# Patient Record
Sex: Female | Born: 1955
Health system: Southern US, Community
[De-identification: ages and names within clinical notes are randomized; demographics above are authoritative.]

## PROBLEM LIST (undated history)

## (undated) DIAGNOSIS — T8859XA Other complications of anesthesia, initial encounter: Secondary | ICD-10-CM

## (undated) DIAGNOSIS — F32A Depression, unspecified: Secondary | ICD-10-CM

## (undated) DIAGNOSIS — M549 Dorsalgia, unspecified: Secondary | ICD-10-CM

## (undated) DIAGNOSIS — I341 Nonrheumatic mitral (valve) prolapse: Secondary | ICD-10-CM

## (undated) DIAGNOSIS — Z8669 Personal history of other diseases of the nervous system and sense organs: Secondary | ICD-10-CM

## (undated) DIAGNOSIS — B009 Herpesviral infection, unspecified: Secondary | ICD-10-CM

## (undated) DIAGNOSIS — T4145XA Adverse effect of unspecified anesthetic, initial encounter: Secondary | ICD-10-CM

## (undated) DIAGNOSIS — I10 Essential (primary) hypertension: Secondary | ICD-10-CM

## (undated) DIAGNOSIS — G8929 Other chronic pain: Secondary | ICD-10-CM

## (undated) DIAGNOSIS — K219 Gastro-esophageal reflux disease without esophagitis: Secondary | ICD-10-CM

## (undated) DIAGNOSIS — R0602 Shortness of breath: Secondary | ICD-10-CM

## (undated) DIAGNOSIS — J189 Pneumonia, unspecified organism: Secondary | ICD-10-CM

## (undated) DIAGNOSIS — F329 Major depressive disorder, single episode, unspecified: Secondary | ICD-10-CM

## (undated) DIAGNOSIS — J302 Other seasonal allergic rhinitis: Secondary | ICD-10-CM

## (undated) DIAGNOSIS — E785 Hyperlipidemia, unspecified: Secondary | ICD-10-CM

## (undated) DIAGNOSIS — E039 Hypothyroidism, unspecified: Secondary | ICD-10-CM

## (undated) DIAGNOSIS — R011 Cardiac murmur, unspecified: Secondary | ICD-10-CM

## (undated) DIAGNOSIS — J45909 Unspecified asthma, uncomplicated: Secondary | ICD-10-CM

## (undated) HISTORY — PX: TONSILLECTOMY: SUR1361

## (undated) HISTORY — PX: COLONOSCOPY: SHX174

## (undated) HISTORY — PX: ESOPHAGOGASTRODUODENOSCOPY: SHX1529

## (undated) HISTORY — PX: OTHER SURGICAL HISTORY: SHX169

---

## 1998-10-21 ENCOUNTER — Ambulatory Visit (HOSPITAL_BASED_OUTPATIENT_CLINIC_OR_DEPARTMENT_OTHER): Admission: RE | Admit: 1998-10-21 | Discharge: 1998-10-21 | Payer: Self-pay | Admitting: Orthopedic Surgery

## 1999-07-24 ENCOUNTER — Other Ambulatory Visit: Admission: RE | Admit: 1999-07-24 | Discharge: 1999-07-24 | Payer: Self-pay | Admitting: Obstetrics and Gynecology

## 2000-06-24 ENCOUNTER — Encounter: Admission: RE | Admit: 2000-06-24 | Discharge: 2000-06-24 | Payer: Self-pay | Admitting: Obstetrics and Gynecology

## 2000-06-24 ENCOUNTER — Encounter: Payer: Self-pay | Admitting: Obstetrics and Gynecology

## 2000-07-29 ENCOUNTER — Other Ambulatory Visit: Admission: RE | Admit: 2000-07-29 | Discharge: 2000-07-29 | Payer: Self-pay | Admitting: *Deleted

## 2001-07-07 ENCOUNTER — Encounter: Payer: Self-pay | Admitting: Obstetrics and Gynecology

## 2001-07-07 ENCOUNTER — Encounter: Admission: RE | Admit: 2001-07-07 | Discharge: 2001-07-07 | Payer: Self-pay | Admitting: Obstetrics and Gynecology

## 2001-08-29 ENCOUNTER — Other Ambulatory Visit: Admission: RE | Admit: 2001-08-29 | Discharge: 2001-08-29 | Payer: Self-pay | Admitting: Obstetrics and Gynecology

## 2002-03-07 ENCOUNTER — Ambulatory Visit (HOSPITAL_COMMUNITY): Admission: RE | Admit: 2002-03-07 | Discharge: 2002-03-07 | Payer: Self-pay | Admitting: *Deleted

## 2002-03-07 ENCOUNTER — Encounter: Payer: Self-pay | Admitting: *Deleted

## 2002-07-13 ENCOUNTER — Encounter: Admission: RE | Admit: 2002-07-13 | Discharge: 2002-07-13 | Payer: Self-pay | Admitting: Obstetrics and Gynecology

## 2002-07-13 ENCOUNTER — Encounter: Payer: Self-pay | Admitting: Obstetrics and Gynecology

## 2003-06-12 ENCOUNTER — Other Ambulatory Visit: Admission: RE | Admit: 2003-06-12 | Discharge: 2003-06-12 | Payer: Self-pay | Admitting: Obstetrics and Gynecology

## 2003-09-16 ENCOUNTER — Encounter: Admission: RE | Admit: 2003-09-16 | Discharge: 2003-09-16 | Payer: Self-pay | Admitting: Obstetrics and Gynecology

## 2003-09-16 ENCOUNTER — Encounter: Payer: Self-pay | Admitting: Obstetrics and Gynecology

## 2004-05-18 ENCOUNTER — Ambulatory Visit (HOSPITAL_COMMUNITY): Admission: RE | Admit: 2004-05-18 | Discharge: 2004-05-18 | Payer: Self-pay | Admitting: Internal Medicine

## 2004-10-08 ENCOUNTER — Encounter: Admission: RE | Admit: 2004-10-08 | Discharge: 2004-10-08 | Payer: Self-pay | Admitting: Obstetrics and Gynecology

## 2005-03-30 ENCOUNTER — Encounter: Admission: RE | Admit: 2005-03-30 | Discharge: 2005-03-30 | Payer: Self-pay | Admitting: Orthopedic Surgery

## 2005-10-12 ENCOUNTER — Encounter: Admission: RE | Admit: 2005-10-12 | Discharge: 2005-10-12 | Payer: Self-pay | Admitting: Obstetrics and Gynecology

## 2005-11-12 ENCOUNTER — Ambulatory Visit: Payer: Self-pay

## 2006-01-20 ENCOUNTER — Ambulatory Visit (HOSPITAL_COMMUNITY): Admission: RE | Admit: 2006-01-20 | Discharge: 2006-01-20 | Payer: Self-pay | Admitting: Internal Medicine

## 2006-11-29 HISTORY — PX: CHOLECYSTECTOMY: SHX55

## 2006-12-26 ENCOUNTER — Ambulatory Visit (HOSPITAL_COMMUNITY): Admission: RE | Admit: 2006-12-26 | Discharge: 2006-12-26 | Payer: Self-pay | Admitting: Gastroenterology

## 2006-12-30 ENCOUNTER — Encounter (INDEPENDENT_AMBULATORY_CARE_PROVIDER_SITE_OTHER): Payer: Self-pay | Admitting: Specialist

## 2006-12-30 ENCOUNTER — Ambulatory Visit (HOSPITAL_COMMUNITY): Admission: RE | Admit: 2006-12-30 | Discharge: 2006-12-31 | Payer: Self-pay | Admitting: Surgery

## 2007-06-21 ENCOUNTER — Encounter: Admission: RE | Admit: 2007-06-21 | Discharge: 2007-06-21 | Payer: Self-pay | Admitting: Obstetrics and Gynecology

## 2008-07-05 ENCOUNTER — Encounter: Admission: RE | Admit: 2008-07-05 | Discharge: 2008-07-05 | Payer: Self-pay | Admitting: Obstetrics and Gynecology

## 2009-07-23 ENCOUNTER — Encounter: Admission: RE | Admit: 2009-07-23 | Discharge: 2009-07-23 | Payer: Self-pay | Admitting: Obstetrics and Gynecology

## 2009-11-29 HISTORY — PX: DILATION AND CURETTAGE OF UTERUS: SHX78

## 2009-11-29 HISTORY — PX: ABLATION: SHX5711

## 2010-02-27 ENCOUNTER — Encounter: Admission: RE | Admit: 2010-02-27 | Discharge: 2010-02-27 | Payer: Self-pay | Admitting: Orthopedic Surgery

## 2010-03-29 HISTORY — PX: CERVICAL FUSION: SHX112

## 2010-04-01 ENCOUNTER — Ambulatory Visit (HOSPITAL_COMMUNITY): Admission: RE | Admit: 2010-04-01 | Discharge: 2010-04-01 | Payer: Self-pay | Admitting: Obstetrics and Gynecology

## 2010-04-16 ENCOUNTER — Inpatient Hospital Stay (HOSPITAL_COMMUNITY): Admission: RE | Admit: 2010-04-16 | Discharge: 2010-04-17 | Payer: Self-pay | Admitting: Neurological Surgery

## 2010-10-09 ENCOUNTER — Encounter: Admission: RE | Admit: 2010-10-09 | Discharge: 2010-10-09 | Payer: Self-pay | Admitting: Obstetrics and Gynecology

## 2010-12-08 IMAGING — CR DG CERVICAL SPINE 2 OR 3 VIEWS
1 series · 1 of 1 positions shown · non-contrast
Comparison: MRI cervical spine 02/27/2010

CLINICAL DATA: Neck pain

CERVICAL SPINE - 2-3 VIEW

[view not recorded]
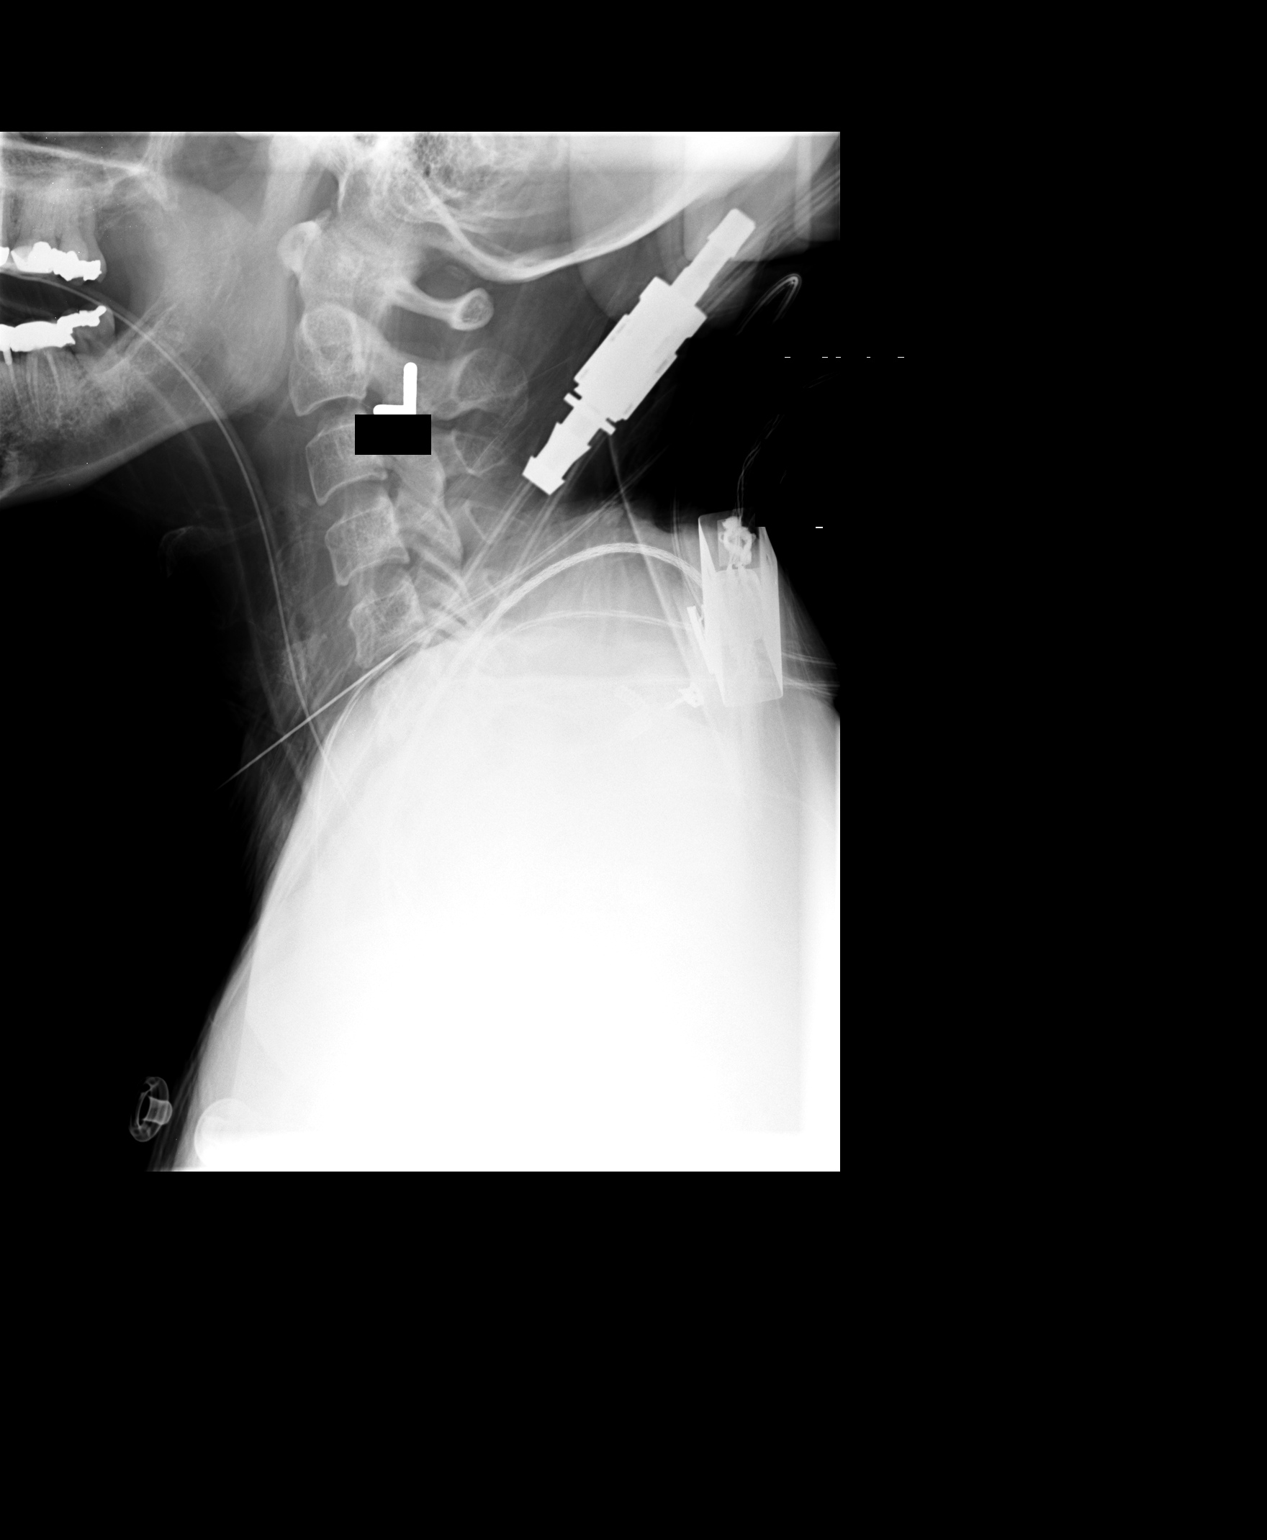

[1 of 1 positions shown; findings below may reference images not displayed]

FINDINGS: Film #1 demonstrates a needle at C5-6 from an anterior
approach.  Film #2 demonstrates ACDF from C5-C7 with satisfactory
interbody plug and plate alignment. A sponge is seen in the
anterior soft tissues.
IMPRESSION: As above.

## 2010-12-08 IMAGING — CR DG CHEST 2V
2 series · 2 of 2 positions shown · non-contrast
Comparison: PA and lateral chest 12/29/2006.

CLINICAL DATA: Preoperative respiratory film.

CHEST - 2 VIEW

[view not recorded (1 of 2)]
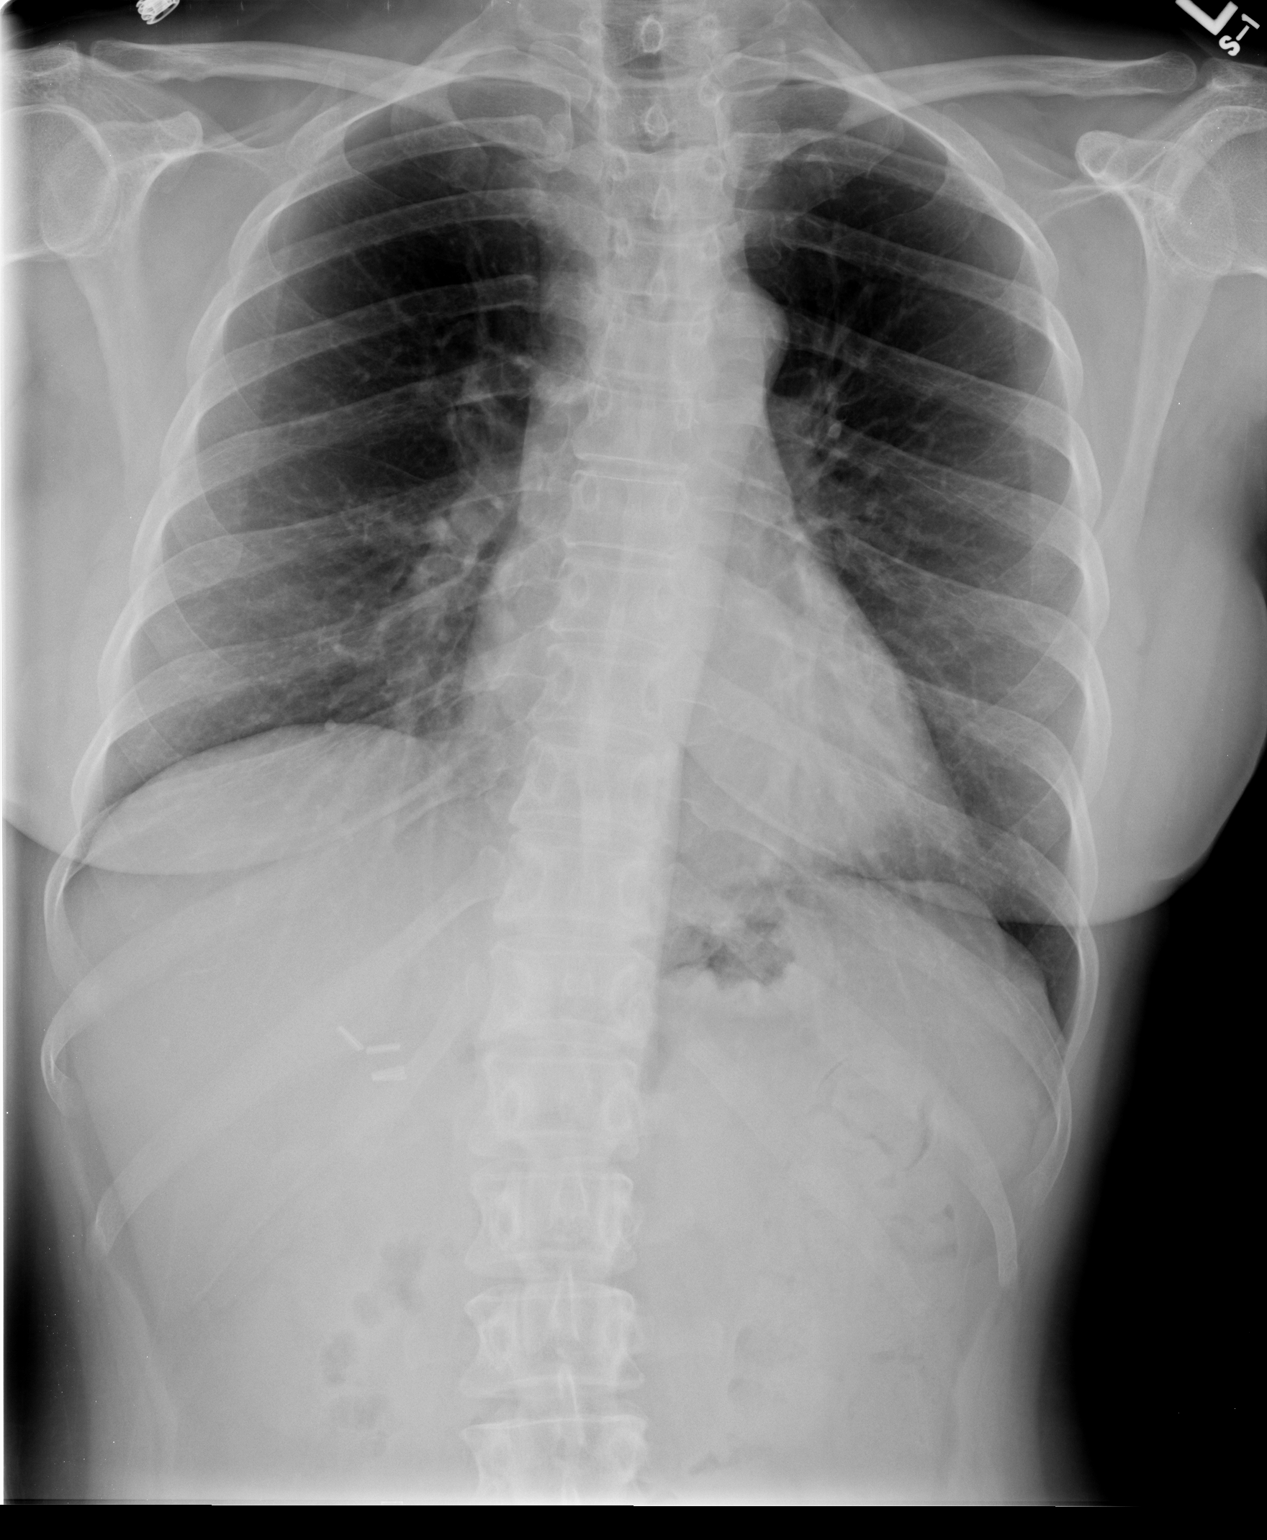

[view not recorded (2 of 2)]
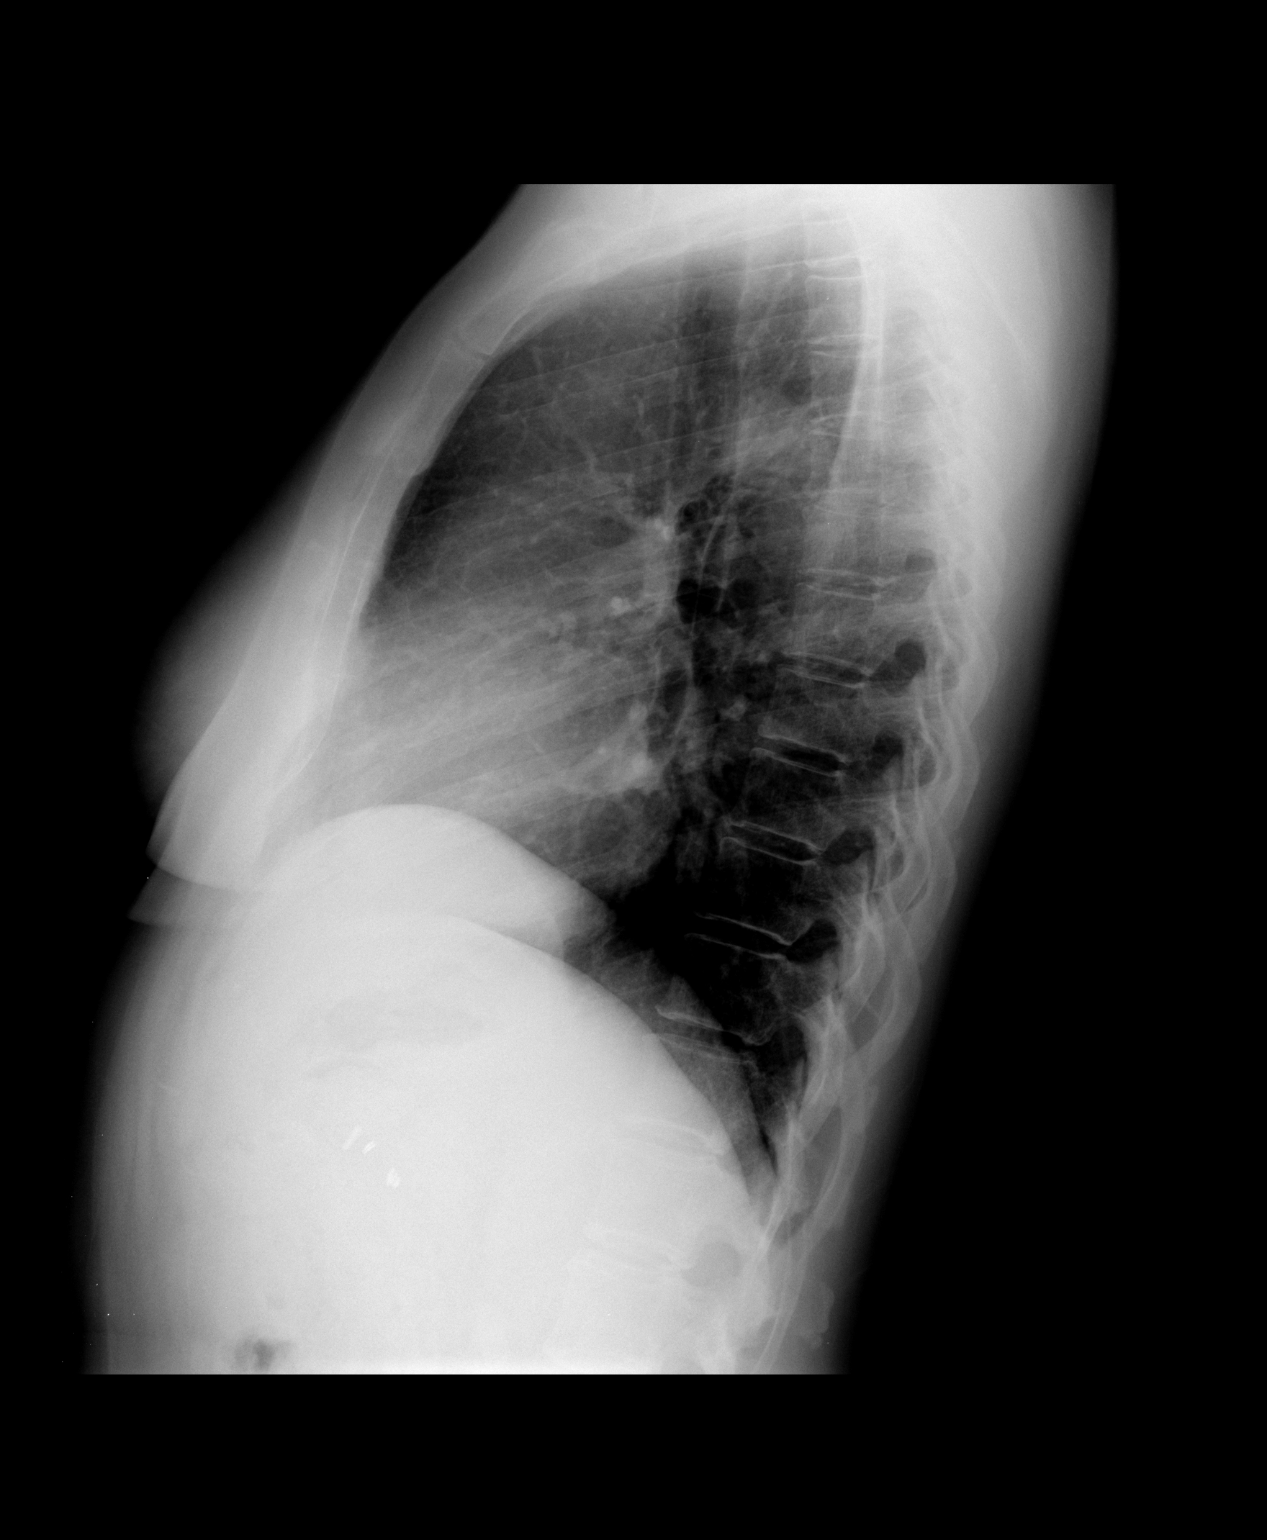

[2 of 2 positions shown; findings below may reference images not displayed]

FINDINGS: Lungs are clear.  No pleural effusion.  Heart size
normal.  No focal bony abnormality.
IMPRESSION: No acute disease.

## 2010-12-14 ENCOUNTER — Encounter
Admission: RE | Admit: 2010-12-14 | Discharge: 2010-12-14 | Payer: Self-pay | Source: Home / Self Care | Attending: Obstetrics and Gynecology | Admitting: Obstetrics and Gynecology

## 2010-12-20 ENCOUNTER — Encounter: Payer: Self-pay | Admitting: Obstetrics and Gynecology

## 2011-01-12 ENCOUNTER — Other Ambulatory Visit: Payer: Self-pay | Admitting: Obstetrics & Gynecology

## 2011-02-16 LAB — CBC
HCT: 38.4 % (ref 36.0–46.0)
HCT: 36.2 % (ref 36.0–46.0)
Hemoglobin: 13.3 g/dL (ref 12.0–15.0)
Hemoglobin: 12.5 g/dL (ref 12.0–15.0)
MCHC: 34.7 g/dL (ref 30.0–36.0)
MCHC: 34.5 g/dL (ref 30.0–36.0)
MCV: 94.6 fL (ref 78.0–100.0)
MCV: 95.2 fL (ref 78.0–100.0)
Platelets: 382 10*3/uL (ref 150–400)
Platelets: 299 10*3/uL (ref 150–400)
RBC: 4.06 MIL/uL (ref 3.87–5.11)
RBC: 3.81 MIL/uL — ABNORMAL LOW (ref 3.87–5.11)
RDW: 12.5 % (ref 11.5–15.5)
RDW: 12.7 % (ref 11.5–15.5)
WBC: 5.9 10*3/uL (ref 4.0–10.5)
WBC: 6.6 10*3/uL (ref 4.0–10.5)

## 2011-02-16 LAB — BASIC METABOLIC PANEL
BUN: 6 mg/dL (ref 6–23)
CO2: 24 mEq/L (ref 19–32)
Calcium: 9.4 mg/dL (ref 8.4–10.5)
Chloride: 106 mEq/L (ref 96–112)
Creatinine, Ser: 0.71 mg/dL (ref 0.4–1.2)
GFR calc Af Amer: >60 mL/min (ref >60)
GFR calc non Af Amer: >60 mL/min (ref >60)
Glucose, Bld: 118 mg/dL — ABNORMAL HIGH (ref 70–99)
Potassium: 3.8 mEq/L (ref 3.5–5.1)
Sodium: 138 mEq/L (ref 135–145)

## 2011-02-16 LAB — PREGNANCY, URINE: Preg Test, Ur: NEGATIVE

## 2011-02-16 LAB — SURGICAL PCR SCREEN
MRSA, PCR: NEGATIVE
Staphylococcus aureus: NEGATIVE

## 2011-04-16 NOTE — Op Note (Signed)
[name], Rodriguez               ACCOUNT NO.:  [id number]   MEDICAL RECORD NO.:  [id number]          PATIENT TYPE:  OIB   LOCATION:  6729                         FACILITY:  [facility]   PHYSICIAN:  Currie Paris, M.D.DATE OF BIRTH:  1956/04/14   DATE OF PROCEDURE:  12/30/2006  DATE OF DISCHARGE:                               OPERATIVE REPORT   PREOPERATIVE DIAGNOSIS:  Biliary dyskinesia and biliary colic.   POSTOPERATIVE DIAGNOSIS:  Biliary dyskinesia and biliary colic.   OPERATION:  Laparoscopic cholecystectomy with interoperative  cholangiogram.   SURGEON:  Currie Paris, M.D.   ASSISTANT:  Ardeth Sportsman, M.D.   ANESTHESIA:  General.   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  She did not have gallstones and emptying  scan was within normal range at 58% (upper limit being 50%) but she had  reproduction of her symptoms when administered the fatty meal.  Because  of her ongoing symptoms, we elected to proceed to laparoscopic  cholecystectomy.   DESCRIPTION OF PROCEDURE:  The patient was seen in the holding area and  she had no further questions.  We reviewed the plans for surgery.  The  patient was taken to the operating room. After satisfactory general  endotracheal anesthesia had been obtained, the abdomen was prepped and  draped.  0.25% plain Marcaine was used for each incision.  The umbilical  incision was made, the fascia opened, and the perineal cavity entered.  The pursestring was placed, Hassan introduced, and the abdomen was  insufflated to 15.  The patient was placed reversed Trendelenburg and  tilted to left.  The 10/11 trocar was placed in the epigastrium and two  5 mm laterally.   The gallbladder was basically normal in appearance.  It was retracted  over the liver and the area of the triangle of Calot dissected out and  the peritoneum opened.  I was able to dissect a nice long segment of  the  cystic duct as well as one of the cystic artery.  I put a single clip on  each.  The cystic duct was opened and a Cook catheter introduced  percutaneously and operative cholangiography done.  We had good filling  of the hepatic ducts, common duct, and duodenum with no definite filling  defects seen.   The gallbladder was then clipped with three additional clips and  divided.  Additional clips were placed on the cystic artery and it was  divided and a posterior branch clipped and divided.  The gallbladder was  removed from below to above.  There was really no plane of dissection  and even gentle traction on the gallbladder caused it to pull out of its  bed leaving a raw surface of liver which was oozing.  We continued the  cholecystectomy and put the gallbladder in a bag and spent several  minutes irrigating, cauterizing, placing Surgicel in.  The liver,  itself, was very friable and even gentle traction caused it to ooze.  I  put some FloSeal in both down near the bottom  of the gallbladder bed and  another one up top and put Surgicel in and watched that for several  minutes and there was no evidence of additional bleeding.  I did not put  this in until we already had what appeared to be all the bleeding  controlled.   At this point, we did a final irrigation.  We got as much of the  irrigant out as possible.  The lateral ports were removed.  The  umbilical port was closed with the pursestring.  The fascia was closed  at the epigastric port with Vicryl and the skin with 4-0 Monocryl  subcuticular plus Dermabond.  The patient tolerated the procedure well.  There were no complications.  All counts were correct.  Estimated blood  loss was about 100 mL.      Currie Paris, M.D.  Electronically Signed     CJS/MEDQ  D:  12/30/2006  T:  12/30/2006  Job:  [id number]   cc:   Bernette Redbird, M.D.  Lovenia Kim, D.O.

## 2012-07-12 ENCOUNTER — Other Ambulatory Visit: Payer: Self-pay | Admitting: Obstetrics

## 2012-07-12 DIAGNOSIS — R921 Mammographic calcification found on diagnostic imaging of breast: Secondary | ICD-10-CM

## 2012-10-09 ENCOUNTER — Ambulatory Visit
Admission: RE | Admit: 2012-10-09 | Discharge: 2012-10-09 | Disposition: A | Discharge status: Home / Self Care | Payer: 59 | Source: Ambulatory Visit | Attending: Obstetrics | Admitting: Obstetrics

## 2012-10-09 DIAGNOSIS — R921 Mammographic calcification found on diagnostic imaging of breast: Secondary | ICD-10-CM

## 2012-10-13 ENCOUNTER — Other Ambulatory Visit: Payer: Self-pay | Admitting: Neurological Surgery

## 2012-10-19 ENCOUNTER — Encounter (HOSPITAL_COMMUNITY)
Admission: RE | Admit: 2012-10-19 | Discharge: 2012-10-19 | Disposition: A | Discharge status: Home / Self Care | Payer: 59 | Source: Ambulatory Visit | Attending: Anesthesiology | Admitting: Anesthesiology

## 2012-10-19 ENCOUNTER — Encounter (HOSPITAL_COMMUNITY)
Admission: RE | Admit: 2012-10-19 | Discharge: 2012-10-19 | Disposition: A | Discharge status: Home / Self Care | Payer: 59 | Source: Ambulatory Visit | Attending: Neurological Surgery | Admitting: Neurological Surgery

## 2012-10-19 ENCOUNTER — Encounter (HOSPITAL_COMMUNITY): Payer: Self-pay

## 2012-10-19 ENCOUNTER — Encounter (HOSPITAL_COMMUNITY): Payer: Self-pay | Admitting: Pharmacy Technician

## 2012-10-19 HISTORY — DX: Adverse effect of unspecified anesthetic, initial encounter: T41.45XA

## 2012-10-19 HISTORY — DX: Hypothyroidism, unspecified: E03.9

## 2012-10-19 HISTORY — DX: Herpesviral infection, unspecified: B00.9

## 2012-10-19 HISTORY — DX: Hyperlipidemia, unspecified: E78.5

## 2012-10-19 HISTORY — DX: Nonrheumatic mitral (valve) prolapse: I34.1

## 2012-10-19 HISTORY — DX: Other chronic pain: G89.29

## 2012-10-19 HISTORY — DX: Other seasonal allergic rhinitis: J30.2

## 2012-10-19 HISTORY — DX: Dorsalgia, unspecified: M54.9

## 2012-10-19 HISTORY — DX: Cardiac murmur, unspecified: R01.1

## 2012-10-19 HISTORY — DX: Other complications of anesthesia, initial encounter: T88.59XA

## 2012-10-19 HISTORY — DX: Shortness of breath: R06.02

## 2012-10-19 HISTORY — DX: Essential (primary) hypertension: I10

## 2012-10-19 HISTORY — DX: Unspecified asthma, uncomplicated: J45.909

## 2012-10-19 HISTORY — DX: Depression, unspecified: F32.A

## 2012-10-19 HISTORY — DX: Pneumonia, unspecified organism: J18.9

## 2012-10-19 HISTORY — DX: Personal history of other diseases of the nervous system and sense organs: Z86.69

## 2012-10-19 HISTORY — DX: Gastro-esophageal reflux disease without esophagitis: K21.9

## 2012-10-19 HISTORY — DX: Major depressive disorder, single episode, unspecified: F32.9

## 2012-10-19 LAB — CBC
HCT: 41.2 % (ref 36.0–46.0)
Hemoglobin: 13.6 g/dL (ref 12.0–15.0)
MCH: 31.3 pg (ref 26.0–34.0)
MCHC: 33 g/dL (ref 30.0–36.0)
MCV: 94.9 fL (ref 78.0–100.0)
Platelets: 276 10*3/uL (ref 150–400)
RBC: 4.34 MIL/uL (ref 3.87–5.11)
RDW: 12.3 % (ref 11.5–15.5)
WBC: 6.9 10*3/uL (ref 4.0–10.5)

## 2012-10-19 LAB — BASIC METABOLIC PANEL
BUN: 10 mg/dL (ref 6–23)
CO2: 27 mEq/L (ref 19–32)
Calcium: 9.2 mg/dL (ref 8.4–10.5)
Chloride: 104 mEq/L (ref 96–112)
Creatinine, Ser: 0.76 mg/dL (ref 0.50–1.10)
GFR calc Af Amer: >90 mL/min (ref >90)
GFR calc non Af Amer: >90 mL/min (ref >90)
Glucose, Bld: 107 mg/dL — ABNORMAL HIGH (ref 70–99)
Potassium: 3.7 mEq/L (ref 3.5–5.1)
Sodium: 141 mEq/L (ref 135–145)

## 2012-10-19 LAB — SURGICAL PCR SCREEN
MRSA, PCR: NEGATIVE
Staphylococcus aureus: NEGATIVE

## 2012-10-19 NOTE — Progress Notes (Addendum)
Temporary crown in upper left of mouth

## 2012-10-19 NOTE — Progress Notes (Addendum)
Pt doesn't have a cardiologist  Stress test done >42yrs ago(saw Dr.Hochrein)-pt states that it was normal   Echo done in her 20's and this is when MVP was diagnosed Denies ever having a heart cath  Dr.McGowen is Medical MD  Pt states EKG done with Dr.McKowan of [facility] Denies CXR in past yr

## 2012-10-19 NOTE — Pre-Procedure Instructions (Signed)
20 ZERLINE MELCHIOR  10/19/2012   Your procedure is scheduled on:  Tues, Nov 26 @ 7:30 AM  Report to [facility] at 5:30 AM.  Call this number if you have problems the morning of surgery: [phone]   Remember:   Do not eat food:After Midnight.  Take these medicines the morning of surgery with A SIP OF WATER: Synthroid(Levothyroxine),Dulera<Bring Your Inhaler With You>,Pantoprazole(Protonix),Pristiq,and Tramadol(Ultram)   Do not wear jewelry, make-up or nail polish.  Do not wear lotions, powders, or perfumes. You may wear deodorant.  Do not shave 48 hours prior to surgery.   Do not bring valuables to the hospital.  Contacts, dentures or bridgework may not be worn into surgery.  Leave suitcase in the car. After surgery it may be brought to your room.  For patients admitted to the hospital, checkout time is 11:00 AM the day of discharge.   Patients discharged the day of surgery will not be allowed to drive home.    Special Instructions: Shower using CHG 2 nights before surgery and the night before surgery.  If you shower the day of surgery use CHG.  Use special wash - you have one bottle of CHG for all showers.  You should use approximately 1/3 of the bottle for each shower.   Please read over the following fact sheets that you were given: Pain Booklet, Coughing and Deep Breathing, MRSA Information and Surgical Site Infection Prevention

## 2012-10-23 MED ORDER — CEFAZOLIN SODIUM-DEXTROSE 2-3 GM-% IV SOLR
2.0000 g | INTRAVENOUS | Status: AC
  Administered 2012-10-24: 2 g via INTRAVENOUS
  Filled 2012-10-23 (×2): qty 50

## 2012-10-24 ENCOUNTER — Ambulatory Visit (HOSPITAL_COMMUNITY)
Admission: RE | Admit: 2012-10-24 | Discharge: 2012-10-24 | Disposition: A | Discharge status: Home / Self Care | Payer: 59 | Source: Ambulatory Visit | Attending: Neurological Surgery | Admitting: Neurological Surgery

## 2012-10-24 ENCOUNTER — Ambulatory Visit (HOSPITAL_COMMUNITY): Payer: 59

## 2012-10-24 ENCOUNTER — Ambulatory Visit (HOSPITAL_COMMUNITY): Payer: 59 | Admitting: Anesthesiology

## 2012-10-24 ENCOUNTER — Encounter (HOSPITAL_COMMUNITY): Payer: Self-pay | Admitting: *Deleted

## 2012-10-24 ENCOUNTER — Encounter (HOSPITAL_COMMUNITY)
Admission: RE | Disposition: A | Discharge status: Home / Self Care | Payer: Self-pay | Source: Ambulatory Visit | Attending: Neurological Surgery

## 2012-10-24 ENCOUNTER — Encounter (HOSPITAL_COMMUNITY): Payer: Self-pay | Admitting: Certified Registered Nurse Anesthetist

## 2012-10-24 ENCOUNTER — Encounter (HOSPITAL_COMMUNITY): Payer: Self-pay | Admitting: Anesthesiology

## 2012-10-24 DIAGNOSIS — Z79899 Other long term (current) drug therapy: Secondary | ICD-10-CM | POA: Insufficient documentation

## 2012-10-24 DIAGNOSIS — Z01818 Encounter for other preprocedural examination: Secondary | ICD-10-CM | POA: Insufficient documentation

## 2012-10-24 DIAGNOSIS — Z7902 Long term (current) use of antithrombotics/antiplatelets: Secondary | ICD-10-CM | POA: Insufficient documentation

## 2012-10-24 DIAGNOSIS — Z01812 Encounter for preprocedural laboratory examination: Secondary | ICD-10-CM | POA: Insufficient documentation

## 2012-10-24 DIAGNOSIS — E039 Hypothyroidism, unspecified: Secondary | ICD-10-CM | POA: Insufficient documentation

## 2012-10-24 DIAGNOSIS — K219 Gastro-esophageal reflux disease without esophagitis: Secondary | ICD-10-CM | POA: Insufficient documentation

## 2012-10-24 DIAGNOSIS — Z01810 Encounter for preprocedural cardiovascular examination: Secondary | ICD-10-CM | POA: Insufficient documentation

## 2012-10-24 DIAGNOSIS — I10 Essential (primary) hypertension: Secondary | ICD-10-CM | POA: Insufficient documentation

## 2012-10-24 DIAGNOSIS — M5126 Other intervertebral disc displacement, lumbar region: Secondary | ICD-10-CM | POA: Insufficient documentation

## 2012-10-24 HISTORY — PX: LUMBAR LAMINECTOMY/DECOMPRESSION MICRODISCECTOMY: SHX5026

## 2012-10-24 SURGERY — LUMBAR LAMINECTOMY/DECOMPRESSION MICRODISCECTOMY 1 LEVEL
Anesthesia: General | Site: Spine Lumbar | Laterality: Right | Wound class: Clean

## 2012-10-24 MED ORDER — OXYCODONE HCL 5 MG/5ML PO SOLN: 5.0000 mg | Freq: Once | ORAL | Status: AC | PRN

## 2012-10-24 MED ORDER — ACETAMINOPHEN 650 MG RE SUPP: 650.0000 mg | RECTAL | Status: DC | PRN

## 2012-10-24 MED ORDER — ONDANSETRON HCL 4 MG/2ML IJ SOLN: 4.0000 mg | INTRAMUSCULAR | Status: DC | PRN

## 2012-10-24 MED ORDER — MORPHINE SULFATE 2 MG/ML IJ SOLN: 1.0000 mg | INTRAMUSCULAR | Status: DC | PRN

## 2012-10-24 MED ORDER — SODIUM CHLORIDE 0.9 % IJ SOLN: 3.0000 mL | INTRAMUSCULAR | Status: DC | PRN

## 2012-10-24 MED ORDER — FLUTICASONE PROPIONATE 50 MCG/ACT NA SUSP
2.0000 | Freq: Every evening | NASAL | Status: DC
  Filled 2012-10-24: qty 16

## 2012-10-24 MED ORDER — MENTHOL 3 MG MT LOZG: 1.0000 | LOZENGE | OROMUCOSAL | Status: DC | PRN

## 2012-10-24 MED ORDER — MIDAZOLAM HCL 5 MG/5ML IJ SOLN
INTRAMUSCULAR | Status: DC | PRN
  Administered 2012-10-24: 2 mg via INTRAVENOUS

## 2012-10-24 MED ORDER — LACTATED RINGERS IV SOLN
INTRAVENOUS | Status: DC | PRN
  Administered 2012-10-24 (×2): via INTRAVENOUS

## 2012-10-24 MED ORDER — IRBESARTAN 300 MG PO TABS
300.0000 mg | ORAL_TABLET | Freq: Every day | ORAL | Status: DC
  Filled 2012-10-24: qty 1

## 2012-10-24 MED ORDER — HYDROMORPHONE HCL PF 1 MG/ML IJ SOLN
0.2500 mg | INTRAMUSCULAR | Status: DC | PRN
  Administered 2012-10-24 (×2): 0.5 mg via INTRAVENOUS

## 2012-10-24 MED ORDER — MONTELUKAST SODIUM 10 MG PO TABS
10.0000 mg | ORAL_TABLET | Freq: Every day | ORAL | Status: DC
  Filled 2012-10-24: qty 1

## 2012-10-24 MED ORDER — THROMBIN 5000 UNITS EX KIT
PACK | CUTANEOUS | Status: DC | PRN
  Administered 2012-10-24 (×2): 5000 [IU] via TOPICAL

## 2012-10-24 MED ORDER — CEFAZOLIN SODIUM 1-5 GM-% IV SOLN
1.0000 g | Freq: Three times a day (TID) | INTRAVENOUS | Status: DC
  Administered 2012-10-24: 1 g via INTRAVENOUS
  Filled 2012-10-24 (×2): qty 50

## 2012-10-24 MED ORDER — PROPOFOL 10 MG/ML IV BOLUS
INTRAVENOUS | Status: DC | PRN
  Administered 2012-10-24: 200 mg via INTRAVENOUS

## 2012-10-24 MED ORDER — ALUM & MAG HYDROXIDE-SIMETH 200-200-20 MG/5ML PO SUSP: 30.0000 mL | Freq: Four times a day (QID) | ORAL | Status: DC | PRN

## 2012-10-24 MED ORDER — GLYCOPYRROLATE 0.2 MG/ML IJ SOLN
INTRAMUSCULAR | Status: DC | PRN
  Administered 2012-10-24: 0.6 mg via INTRAVENOUS

## 2012-10-24 MED ORDER — BUPIVACAINE HCL (PF) 0.5 % IJ SOLN
INTRAMUSCULAR | Status: DC | PRN
  Administered 2012-10-24 (×2): 10 mL

## 2012-10-24 MED ORDER — OXYCODONE HCL 5 MG PO TABS
5.0000 mg | ORAL_TABLET | Freq: Once | ORAL | Status: AC | PRN
  Administered 2012-10-24: 5 mg via ORAL

## 2012-10-24 MED ORDER — OXYCODONE HCL 5 MG PO TABS
ORAL_TABLET | ORAL | Status: AC
  Filled 2012-10-24: qty 1

## 2012-10-24 MED ORDER — BACITRACIN 50000 UNITS IM SOLR
INTRAMUSCULAR | Status: AC
  Filled 2012-10-24: qty 1

## 2012-10-24 MED ORDER — HYDROMORPHONE HCL PF 1 MG/ML IJ SOLN
INTRAMUSCULAR | Status: AC
  Filled 2012-10-24: qty 1

## 2012-10-24 MED ORDER — ADULT MULTIVITAMIN W/MINERALS CH
1.0000 | ORAL_TABLET | Freq: Every day | ORAL | Status: DC
  Filled 2012-10-24: qty 1

## 2012-10-24 MED ORDER — CLONAZEPAM 0.5 MG PO TABS: 0.1250 mg | ORAL_TABLET | Freq: Every evening | ORAL | Status: DC | PRN

## 2012-10-24 MED ORDER — SODIUM CHLORIDE 0.9 % IV SOLN
INTRAVENOUS | Status: AC
  Filled 2012-10-24: qty 500

## 2012-10-24 MED ORDER — LORATADINE 10 MG PO TABS
10.0000 mg | ORAL_TABLET | Freq: Every day | ORAL | Status: DC
  Filled 2012-10-24: qty 1

## 2012-10-24 MED ORDER — ESTRADIOL-NORETHINDRONE ACET 1-0.5 MG PO TABS: 1.0000 | ORAL_TABLET | Freq: Every day | ORAL | Status: DC

## 2012-10-24 MED ORDER — PANTOPRAZOLE SODIUM 40 MG PO TBEC: 40.0000 mg | DELAYED_RELEASE_TABLET | Freq: Every day | ORAL | Status: DC

## 2012-10-24 MED ORDER — SODIUM CHLORIDE 0.9 % IJ SOLN
3.0000 mL | Freq: Two times a day (BID) | INTRAMUSCULAR | Status: DC
  Administered 2012-10-24: 3 mL via INTRAVENOUS

## 2012-10-24 MED ORDER — KETOROLAC TROMETHAMINE 30 MG/ML IJ SOLN
30.0000 mg | Freq: Four times a day (QID) | INTRAMUSCULAR | Status: DC
  Administered 2012-10-24 (×2): 30 mg via INTRAVENOUS
  Filled 2012-10-24: qty 1

## 2012-10-24 MED ORDER — OXYCODONE-ACETAMINOPHEN 5-325 MG PO TABS
1.0000 | ORAL_TABLET | ORAL | Status: DC | PRN
Start: 2012-10-24 — End: 2012-10-24

## 2012-10-24 MED ORDER — LEVOTHYROXINE SODIUM 50 MCG PO TABS
50.0000 ug | ORAL_TABLET | Freq: Every day | ORAL | Status: DC
  Filled 2012-10-24: qty 1

## 2012-10-24 MED ORDER — TRAMADOL HCL 50 MG PO TABS
50.0000 mg | ORAL_TABLET | Freq: Three times a day (TID) | ORAL | Status: DC
  Administered 2012-10-24: 50 mg via ORAL
  Filled 2012-10-24 (×2): qty 1

## 2012-10-24 MED ORDER — SODIUM CHLORIDE 0.9 % IR SOLN
Status: DC | PRN
  Administered 2012-10-24: 09:00:00

## 2012-10-24 MED ORDER — OXYCODONE-ACETAMINOPHEN 5-325 MG PO TABS: 1.0000 | ORAL_TABLET | ORAL | Status: DC | PRN

## 2012-10-24 MED ORDER — MOMETASONE FURO-FORMOTEROL FUM 100-5 MCG/ACT IN AERO
2.0000 | INHALATION_SPRAY | Freq: Every day | RESPIRATORY_TRACT | Status: DC
  Filled 2012-10-24: qty 8.8

## 2012-10-24 MED ORDER — DIAZEPAM 5 MG PO TABS: 5.0000 mg | ORAL_TABLET | Freq: Four times a day (QID) | ORAL | Status: DC | PRN

## 2012-10-24 MED ORDER — PHENOL 1.4 % MT LIQD: 1.0000 | OROMUCOSAL | Status: DC | PRN

## 2012-10-24 MED ORDER — PHENYLEPHRINE HCL 10 MG/ML IJ SOLN
INTRAMUSCULAR | Status: DC | PRN
  Administered 2012-10-24 (×2): 40 ug via INTRAVENOUS
  Administered 2012-10-24: 80 ug via INTRAVENOUS

## 2012-10-24 MED ORDER — DEXAMETHASONE SODIUM PHOSPHATE 4 MG/ML IJ SOLN
INTRAMUSCULAR | Status: DC | PRN
  Administered 2012-10-24: 10 mg via INTRAVENOUS

## 2012-10-24 MED ORDER — 0.9 % SODIUM CHLORIDE (POUR BTL) OPTIME
TOPICAL | Status: DC | PRN
  Administered 2012-10-24: 1000 mL

## 2012-10-24 MED ORDER — DESVENLAFAXINE SUCCINATE ER 100 MG PO TB24: 100.0000 mg | ORAL_TABLET | Freq: Every day | ORAL | Status: DC

## 2012-10-24 MED ORDER — ACETAMINOPHEN 325 MG PO TABS: 650.0000 mg | ORAL_TABLET | ORAL | Status: DC | PRN

## 2012-10-24 MED ORDER — ONDANSETRON HCL 4 MG/2ML IJ SOLN
INTRAMUSCULAR | Status: DC | PRN
  Administered 2012-10-24: 4 mg via INTRAVENOUS

## 2012-10-24 MED ORDER — FENTANYL CITRATE 0.05 MG/ML IJ SOLN
INTRAMUSCULAR | Status: DC | PRN
  Administered 2012-10-24: 100 ug via INTRAVENOUS

## 2012-10-24 MED ORDER — TRAMADOL HCL 50 MG PO TABS: 50.0000 mg | ORAL_TABLET | Freq: Three times a day (TID) | ORAL | Status: DC

## 2012-10-24 MED ORDER — HEMOSTATIC AGENTS (NO CHARGE) OPTIME
TOPICAL | Status: DC | PRN
  Administered 2012-10-24: 1 via TOPICAL

## 2012-10-24 MED ORDER — LIDOCAINE HCL (CARDIAC) 20 MG/ML IV SOLN
INTRAVENOUS | Status: DC | PRN
  Administered 2012-10-24: 100 mg via INTRAVENOUS

## 2012-10-24 MED ORDER — ROCURONIUM BROMIDE 100 MG/10ML IV SOLN
INTRAVENOUS | Status: DC | PRN
  Administered 2012-10-24: 50 mg via INTRAVENOUS

## 2012-10-24 MED ORDER — NEOSTIGMINE METHYLSULFATE 1 MG/ML IJ SOLN
INTRAMUSCULAR | Status: DC | PRN
  Administered 2012-10-24: 4 mg via INTRAVENOUS

## 2012-10-24 MED ORDER — ATORVASTATIN CALCIUM 40 MG PO TABS
40.0000 mg | ORAL_TABLET | Freq: Every day | ORAL | Status: DC
  Filled 2012-10-24: qty 1

## 2012-10-24 MED ORDER — METOCLOPRAMIDE HCL 5 MG/ML IJ SOLN: 10.0000 mg | Freq: Once | INTRAMUSCULAR | Status: DC | PRN

## 2012-10-24 SURGICAL SUPPLY — 56 items
ADH SKN CLS APL DERMABOND .7 (GAUZE/BANDAGES/DRESSINGS)
ADH SKN CLS LQ APL DERMABOND (GAUZE/BANDAGES/DRESSINGS) ×1
BAG DECANTER FOR FLEXI CONT (MISCELLANEOUS) ×2 IMPLANT
BLADE SURG ROTATE 9660 (MISCELLANEOUS) IMPLANT
BUR ACORN 6.0 (BURR) ×1 IMPLANT
BUR MATCHSTICK NEURO 3.0 LAGG (BURR) ×2 IMPLANT
CANISTER SUCTION 2500CC (MISCELLANEOUS) ×2 IMPLANT
CLOTH BEACON ORANGE TIMEOUT ST (SAFETY) ×2 IMPLANT
CONT SPEC 4OZ CLIKSEAL STRL BL (MISCELLANEOUS) ×2 IMPLANT
DECANTER SPIKE VIAL GLASS SM (MISCELLANEOUS) ×1 IMPLANT
DERMABOND ADHESIVE PROPEN (GAUZE/BANDAGES/DRESSINGS) ×1
DERMABOND ADVANCED (GAUZE/BANDAGES/DRESSINGS)
DERMABOND ADVANCED .7 DNX12 (GAUZE/BANDAGES/DRESSINGS) ×1 IMPLANT
DERMABOND ADVANCED .7 DNX6 (GAUZE/BANDAGES/DRESSINGS) IMPLANT
DRAPE MICROSCOPE LEICA (MISCELLANEOUS) ×1 IMPLANT
DRAPE PED LAPAROTOMY (DRAPES) ×2 IMPLANT
DRAPE POUCH INSTRU U-SHP 10X18 (DRAPES) ×2 IMPLANT
DRAPE PROXIMA HALF (DRAPES) IMPLANT
DURAPREP 26ML APPLICATOR (WOUND CARE) ×2 IMPLANT
ELECT REM PT RETURN 9FT ADLT (ELECTROSURGICAL) ×2
ELECTRODE REM PT RTRN 9FT ADLT (ELECTROSURGICAL) ×1 IMPLANT
GAUZE SPONGE 4X4 16PLY XRAY LF (GAUZE/BANDAGES/DRESSINGS) IMPLANT
GLOVE BIO SURGEON STRL SZ 6.5 (GLOVE) ×2 IMPLANT
GLOVE BIO SURGEON STRL SZ8 (GLOVE) ×1 IMPLANT
GLOVE BIOGEL PI IND STRL 6.5 (GLOVE) IMPLANT
GLOVE BIOGEL PI IND STRL 8.5 (GLOVE) ×1 IMPLANT
GLOVE BIOGEL PI INDICATOR 6.5 (GLOVE) ×1
GLOVE BIOGEL PI INDICATOR 8.5 (GLOVE) ×2
GLOVE ECLIPSE 8.5 STRL (GLOVE) ×2 IMPLANT
GLOVE EXAM NITRILE LRG STRL (GLOVE) IMPLANT
GLOVE EXAM NITRILE MD LF STRL (GLOVE) IMPLANT
GLOVE EXAM NITRILE XL STR (GLOVE) IMPLANT
GLOVE EXAM NITRILE XS STR PU (GLOVE) IMPLANT
GOWN BRE IMP SLV AUR LG STRL (GOWN DISPOSABLE) ×1 IMPLANT
GOWN BRE IMP SLV AUR XL STRL (GOWN DISPOSABLE) ×1 IMPLANT
GOWN STRL REIN 2XL LVL4 (GOWN DISPOSABLE) ×2 IMPLANT
KIT BASIN OR (CUSTOM PROCEDURE TRAY) ×2 IMPLANT
KIT ROOM TURNOVER OR (KITS) ×2 IMPLANT
NDL SPNL 20GX3.5 QUINCKE YW (NEEDLE) IMPLANT
NEEDLE HYPO 22GX1.5 SAFETY (NEEDLE) ×2 IMPLANT
NEEDLE SPNL 20GX3.5 QUINCKE YW (NEEDLE) ×2 IMPLANT
NS IRRIG 1000ML POUR BTL (IV SOLUTION) ×2 IMPLANT
PACK LAMINECTOMY NEURO (CUSTOM PROCEDURE TRAY) ×2 IMPLANT
PAD ARMBOARD 7.5X6 YLW CONV (MISCELLANEOUS) ×8 IMPLANT
PATTIES SURGICAL .5 X1 (DISPOSABLE) ×2 IMPLANT
RUBBERBAND STERILE (MISCELLANEOUS) ×2 IMPLANT
SPONGE GAUZE 4X4 12PLY (GAUZE/BANDAGES/DRESSINGS) ×1 IMPLANT
SPONGE SURGIFOAM ABS GEL SZ50 (HEMOSTASIS) ×2 IMPLANT
SUT VIC AB 1 CT1 18XBRD ANBCTR (SUTURE) ×1 IMPLANT
SUT VIC AB 1 CT1 8-18 (SUTURE) ×2
SUT VIC AB 2-0 CP2 18 (SUTURE) ×2 IMPLANT
SUT VIC AB 3-0 SH 8-18 (SUTURE) ×2 IMPLANT
SYR 20ML ECCENTRIC (SYRINGE) ×2 IMPLANT
TOWEL OR 17X24 6PK STRL BLUE (TOWEL DISPOSABLE) ×2 IMPLANT
TOWEL OR 17X26 10 PK STRL BLUE (TOWEL DISPOSABLE) ×2 IMPLANT
WATER STERILE IRR 1000ML POUR (IV SOLUTION) ×2 IMPLANT

## 2012-10-24 NOTE — Preoperative (Signed)
Beta Blockers   Reason not to administer Beta Blockers:Not Applicable 

## 2012-10-24 NOTE — H&P (Addendum)
Sarah Rodriguez  #[id number]  DOB:  1956-08-04      CHIEF COMPLAINT:   Right buttock and leg pain.    HISTORY OF PRESENT ILLNESS:  Sarah Rodriguez returns to the office today having last been seen in July of 2011.  At that time she was recovering from anterior cervical decompression and arthrodesis at the levels of C5-6 and C6-C7.  She tells me that for the past year she started to develop some right lumbar radiculopathy.  She was seen and evaluated by Dr. Claria Dice and ultimately underwent an epidural steroid injection.  This cleared up the problem for several months, but then this past spring the pain returned.  She had another epidural steroid injection and the pain subsided.  Then this past September she had further pain and Dr. Regino Schultze performed another injection which gave little to no relief.  She underwent an MRI of the lumbar spine and she brings this study with her.  The study demonstrates that Sarah Rodriguez has evidence of some very modest degenerative changes at L4-5 and L5-S1.  At L5-S1 she has a subligamentous disc herniation on the right side that elevates the takeoff of the S1 nerve root just before the foramen.  I carefully reviewed these studies with her and I note that the image of the S1 nerve root above the disc protrusion shows some edema within the nerve and just below the nerve root one can see that the nerve is thickened also.  I demonstrated the difference between the right side and the left side.  The protrusion of the disc is truly small, however, it is critically located just at the takeoff of the S1 nerve root.  Clinically, she notes that she has not been able to get good relief other than by lying down.  She finds that the day starts out and she can tolerate the pain, but generally by the mid to late afternoon the pain becomes increasingly intolerable.  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  She would like something  definitive done if at all possible.    PAST MEDICAL HISTORY:  Her general health has been good and is essentially unchanged from before.    MEDICATIONS:    Current medications include Protonix, Synthroid, Prestique, Allegra D, Benicar, Klonopin, Singulair, Lipitor, Dulera Inhaler and Flonase.  She is also using Xenopenex as a rescue inhaler on rare occasions.    PHYSICAL EXAMINATION:  I note that she has good strength in the tibialis anterior and the gastrocs group.  She stands and walks on heels without any difficulty.  She walks on her toes also without difficulty.  She is hyporeflexive in the patellae and the Achilles both.    IMPRESSION:    Sarah Rodriguez has evidence of a subligamentous disc herniation at L5-S1 at the takeoff of the S1 nerve root foramen.  I indicated to Sarah Rodriguez that at a minimum she should undergo a laminotomy and foraminotomy.  At the time of surgery we would examine the disc space to see if there is a soft tissue disc herniation that is easily accessible.  If the ligament is calcified, as I suspect it may be, and there is good cover over the disc protrusion, we would leave the disc protrusion alone.  I did indicate to her that the surgery is typically done as an outpatient whether it requires a foraminotomy or the diskectomy.  Typical  recovery time for the foraminotomy is about 4 to 6 weeks, for diskectomy it is a little bit longer usually requiring 6 to 8 weeks.  I discussed the concerns of whether the disc has to be evacuated or not and the differences in recovery time regarding the surgery.  The main risks of the surgery include of course the risks of bleeding and infection.  In this case, however, we also have concerns about the potential for spinal fluid leak.  Should that occur, it can delay the recovery and also necessitate further hospitalization.  However, I am quite hopeful that by simply decompressing the S1 nerve root, we should get her some significant relief of the symptoms.  She  is admitted for surgery today.

## 2012-10-24 NOTE — Op Note (Signed)
Preoperative diagnosis: Herniated nucleus pulposus L5-S1 right with right lumbar radiculopathy Postoperative diagnosis: Herniated nucleus pulposus L5-S1 right with right lumbar radiculopathy Procedure: Microdiscectomy L5-S1 right with operating microscope microdissection technique Surgeon: Barnett Abu M.D. Assistant: Maeola Harman M.D. Indications: Patient is a 56 year old individual is had significant back and right lower extremity pain. She has been having this problem for 2 years period time and an MRI Estrace that she has modest degenerative changes at L5-S1 with some lateral recess stenosis. His initially planned that she may only require a laminotomy and foraminotomies. She is taken to the operating room to undergo surgical decompression.  Procedure: The patient was brought to the operating room supine on a stretcher. After the smooth induction of general endotracheal anesthesia patient was carefully turned to the prone position with the bony prominences being appropriately padded and protected. The lower lumbar spine was prepped with alcohol and DuraPrep and then draped in a sterile fashion. The needle was used to localize the area of L5-S1 and a radiograph was obtained demonstrating a needle at the level of L5-S1. Skin was infiltrated with 10 cc of lidocainemixed 50-50 with half percent Marcaine. An incision was made and carried down to the lumbar dorsal fascia the fascia was opened on the right side of the midline and a subperiosteal dissection was performed in the interlaminar space of L5-S1. A self-retaining retractor was then placed into the wound. The yellow ligament was taken up from the interlaminar space at L5-S1 and the inferior margin of the lamina of L5 was removed and a partial facetectomy was performed at L5-S1. The common dural tube was explored and the take off of the S1 nerve root was identified and gradually mobilized. As the nerve root was mobilized medially off the disc space there  was noted to be a small nubbin of subligamentous sleep herniated disc material protruding directly into the path of the nerve. This was noted to have a rather boggy covering and when incised extruded several small fragments of disc material. These were removed and the fascia covering them was cauterized and resected the disc space itself was noted to be quite tight at this level and was probed but could not be entered easily it was therefore left alone the soft tissues in the area were inspected carefully. Hemostasis in the soft tissues was obtained meticulously. In the end the S1 nerve root was free and clear over this path across the disc space.  After adequate decompression was obtained hemostasis and the soft tissues was verified retractor was removed the operating microscope was removed from the field and the lumbar dorsal fascia was closed with #1 and 2-0 Vicryl in interrupted fashion. 3-0 Vicryl was used to close subcuticular skin. Blood loss was estimated at 25 cc.

## 2012-10-24 NOTE — Discharge Summary (Signed)
Physician Discharge Summary  Patient ID: Sarah Rodriguez MRN: [id number] DOB/AGE: 55/02/1956 55 y.o.  Admit date: 10/24/2012 Discharge date: 10/24/2012  Admission Diagnoses:HNP L5/S1 Right  Discharge Diagnoses: HNP L5/S1 Right Active Problems:  * No active hospital problems. *    Discharged Condition: good  Hospital Course: Uncomplicated microdiscectomy L5S1 Right  Consults: None  Significant Diagnostic Studies: None  Treatments: surgery: Uncomplicated microdiscectomy L5S1 Right  Discharge Exam: Blood pressure 119/73, pulse 72, temperature 97.6 F (36.4 C), temperature source Oral, resp. rate 20, SpO2 95.00%. Neurologic: Alert and oriented X 3, normal strength and tone. Normal symmetric reflexes. Normal coordination and gait Wound:CDI  Disposition: Home     Medication List     As of 10/24/2012  6:37 PM    TAKE these medications         ALLEGRA PO   Take 2 tablets by mouth every morning.      atorvastatin 40 MG tablet   Commonly known as: LIPITOR   Take 40 mg by mouth daily.      clonazePAM 0.5 MG tablet   Commonly known as: KLONOPIN   Take 0.125 mg by mouth at bedtime as needed. For sleep.      desvenlafaxine 100 MG 24 hr tablet   Commonly known as: PRISTIQ   Take 100 mg by mouth daily.      estradiol-norethindrone 1-0.5 MG per tablet   Commonly known as: ACTIVELLA   Take 1 tablet by mouth daily.      fluticasone 50 MCG/ACT nasal spray   Commonly known as: FLONASE   Place 2 sprays into the nose every evening.      levothyroxine 50 MCG tablet   Commonly known as: SYNTHROID, LEVOTHROID   Take 50 mcg by mouth daily.      mometasone-formoterol 100-5 MCG/ACT Aero   Commonly known as: DULERA   Inhale 2 puffs into the lungs every morning.      montelukast 10 MG tablet   Commonly known as: SINGULAIR   Take 10 mg by mouth at bedtime.      multivitamin with minerals Tabs   Take 1 tablet by mouth daily.      olmesartan 40 MG tablet   Commonly known  as: BENICAR   Take 40 mg by mouth every morning.      oxyCODONE-acetaminophen 5-325 MG per tablet   Commonly known as: PERCOCET/ROXICET   Take 1-2 tablets by mouth every 4 (four) hours as needed.      pantoprazole 40 MG tablet   Commonly known as: PROTONIX   Take 40 mg by mouth daily.      traMADol 50 MG tablet   Commonly known as: ULTRAM   Take 1 tablet (50 mg total) by mouth 3 (three) times daily.      traMADol 50 MG tablet   Commonly known as: ULTRAM   Take 50 mg by mouth 3 (three) times daily.      VITAMIN D PO   Take 1 tablet by mouth daily.         Signed: Dorian Heckle, MD 10/24/2012, 6:37 PM

## 2012-10-24 NOTE — Anesthesia Preprocedure Evaluation (Addendum)
Anesthesia Evaluation  Patient identified by MRN, date of birth, ID band Patient awake    Reviewed: Allergy & Precautions, H&P , NPO status , Patient's Chart, lab work & pertinent test results, reviewed documented beta blocker date and time   History of Anesthesia Complications (+) PROLONGED EMERGENCE  Airway Mallampati: II TM Distance: >3 FB Neck ROM: full    Dental  (+)    Pulmonary shortness of breath and with exertion, asthma , pneumonia -, resolved,  breath sounds clear to auscultation        Cardiovascular hypertension, On Medications + Valvular Problems/Murmurs MVP Rhythm:regular     Neuro/Psych PSYCHIATRIC DISORDERS Depression  Neuromuscular disease (Right LE pain and numbness) negative neurological ROS     GI/Hepatic Neg liver ROS, GERD-  Medicated and Controlled,  Endo/Other  Hypothyroidism   Renal/GU negative Renal ROS  negative genitourinary   Musculoskeletal   Abdominal   Peds  Hematology negative hematology ROS (+)   Anesthesia Other Findings See surgeon's H&P   Reproductive/Obstetrics negative OB ROS                          Anesthesia Physical Anesthesia Plan  ASA: II  Anesthesia Plan: General   Post-op Pain Management:    Induction: Intravenous  Airway Management Planned: Oral ETT  Additional Equipment:   Intra-op Plan:   Post-operative Plan: Extubation in OR  Informed Consent: I have reviewed the patients History and Physical, chart, labs and discussed the procedure including the risks, benefits and alternatives for the proposed anesthesia with the patient or authorized representative who has indicated his/her understanding and acceptance.   Dental Advisory Given  Plan Discussed with: CRNA and Surgeon  Anesthesia Plan Comments:         Anesthesia Quick Evaluation

## 2012-10-24 NOTE — Anesthesia Postprocedure Evaluation (Signed)
Anesthesia Post Note  Patient: Sarah Rodriguez  Procedure(s) Performed: Procedure(s) (LRB): LUMBAR LAMINECTOMY/DECOMPRESSION MICRODISCECTOMY 1 LEVEL (Right)  Anesthesia type: General  Patient location: PACU  Post pain: Pain level controlled  Post assessment: Patient's Cardiovascular Status Stable  Last Vitals:  Filed Vitals:   10/24/12 1019  BP: 138/71  Pulse: 82  Temp:   Resp: 12    Post vital signs: Reviewed and stable  Level of consciousness: alert  Complications: No apparent anesthesia complications

## 2012-10-24 NOTE — Plan of Care (Signed)
Problem: Consults Goal: Diagnosis - Spinal Surgery Outcome: Completed/Met Date Met:  10/24/12 Lumbar Laminectomy (Complex)

## 2012-10-24 NOTE — Transfer of Care (Signed)
Immediate Anesthesia Transfer of Care Note  Patient: Sarah Rodriguez  Procedure(s) Performed: Procedure(s) (LRB) with comments: LUMBAR LAMINECTOMY/DECOMPRESSION MICRODISCECTOMY 1 LEVEL (Right) - Right Lumbar five-Sacral One Microdiskectomy  Patient Location: PACU  Anesthesia Type:General  Level of Consciousness: awake and patient cooperative  Airway & Oxygen Therapy: Patient Spontanous Breathing and Patient connected to nasal cannula oxygen  Post-op Assessment: Report given to PACU RN, Post -op Vital signs reviewed and stable and Patient moving all extremities  Post vital signs: Reviewed and stable  Complications: No apparent anesthesia complications

## 2012-10-30 ENCOUNTER — Encounter (HOSPITAL_COMMUNITY): Payer: Self-pay | Admitting: Neurological Surgery

## 2013-04-13 ENCOUNTER — Other Ambulatory Visit (HOSPITAL_COMMUNITY): Payer: Self-pay | Admitting: Neurological Surgery

## 2013-04-13 DIAGNOSIS — IMO0002 Reserved for concepts with insufficient information to code with codable children: Secondary | ICD-10-CM

## 2013-04-25 ENCOUNTER — Encounter (HOSPITAL_COMMUNITY): Payer: Self-pay | Admitting: Pharmacy Technician

## 2013-04-26 ENCOUNTER — Encounter (HOSPITAL_COMMUNITY): Payer: Self-pay

## 2013-04-26 ENCOUNTER — Ambulatory Visit (HOSPITAL_COMMUNITY)
Admission: RE | Admit: 2013-04-26 | Discharge: 2013-04-26 | Disposition: A | Discharge status: Home / Self Care | Payer: 59 | Source: Ambulatory Visit | Attending: Neurological Surgery | Admitting: Neurological Surgery

## 2013-04-26 DIAGNOSIS — R29890 Loss of height: Secondary | ICD-10-CM | POA: Insufficient documentation

## 2013-04-26 DIAGNOSIS — M5126 Other intervertebral disc displacement, lumbar region: Secondary | ICD-10-CM | POA: Insufficient documentation

## 2013-04-26 DIAGNOSIS — IMO0002 Reserved for concepts with insufficient information to code with codable children: Secondary | ICD-10-CM

## 2013-04-26 DIAGNOSIS — Q762 Congenital spondylolisthesis: Secondary | ICD-10-CM | POA: Insufficient documentation

## 2013-04-26 MED ORDER — DIAZEPAM 5 MG PO TABS
10.0000 mg | ORAL_TABLET | Freq: Once | ORAL | Status: AC
  Administered 2013-04-26: 10 mg via ORAL

## 2013-04-26 MED ORDER — OXYCODONE-ACETAMINOPHEN 5-325 MG PO TABS
1.0000 | ORAL_TABLET | Freq: Once | ORAL | Status: AC
  Administered 2013-04-26: 1 via ORAL

## 2013-04-26 MED ORDER — ONDANSETRON HCL 4 MG/2ML IJ SOLN
4.0000 mg | Freq: Four times a day (QID) | INTRAMUSCULAR | Status: DC | PRN
  Administered 2013-04-26: 4 mg via INTRAVENOUS

## 2013-04-26 MED ORDER — HYDROCODONE-ACETAMINOPHEN 5-325 MG PO TABS
1.0000 | ORAL_TABLET | ORAL | Status: DC | PRN
Start: 2013-04-26 — End: 2013-04-27

## 2013-04-26 MED ORDER — IOHEXOL 180 MG/ML  SOLN
20.0000 mL | Freq: Once | INTRAMUSCULAR | Status: AC | PRN
  Administered 2013-04-26: 14 mL via INTRATHECAL

## 2013-04-26 MED ORDER — DIAZEPAM 5 MG PO TABS
ORAL_TABLET | ORAL | Status: AC
  Administered 2013-04-26: 10 mg via ORAL
  Filled 2013-04-26: qty 2

## 2013-04-26 MED ORDER — OXYCODONE-ACETAMINOPHEN 5-325 MG PO TABS
ORAL_TABLET | ORAL | Status: AC
  Filled 2013-04-26: qty 1

## 2013-04-26 MED ORDER — ONDANSETRON HCL 4 MG/2ML IJ SOLN
INTRAMUSCULAR | Status: AC
  Administered 2013-04-26: 4 mg via INTRAVENOUS
  Filled 2013-04-26: qty 2

## 2013-04-26 NOTE — Procedures (Signed)
HOPI: Artisha Capri returns to the office today to discuss ongoing difficulties that she is having with the right lumbar radiculopathy seemingly in the S1 distribution. I again reviewed her MRI from 01/18/2013. Presley notes that the pain is constant, terrible and simply unrelenting. She has been using some Neurontin that help mitigate the pain. This gives her very minor temporary relief. She cannot be comfortable in any position and she finds that sitting particularly aggravates the symptoms worse. Sleeping is very uncomfortable. She tosses and turns and she feels something more definitive needs to be done.  IMPRESSION AND PLAN: I have done an intradiscal injection and epidural steroid injection and none of these have yielded substantial relief. At this time I discussed her options that I believe ultimately she will need a myelogram and postmyelogram CT scan to better define the anatomy in particularly as regards any positional changes that may occur with sitting, standing, flexing and extending. We will arrange this at the earliest convenience.   Pre op Dx: Jeanie Cooks radiculopathy status post microdiscectomy Post op Dx: Lumbar radiculopathy status post microdiscectomy Procedure: Lumbar myelogram Surgeon: Dashanna Kinnamon Puncture level: L3-4 Fluid color: There colorless Injection: Iohexol 1 8010 cc Findings: Spondylosis and spondylolisthesis L4-L5

## 2013-06-17 IMAGING — CR DG LUMBAR SPINE 2-3V
1 series · 1 of 1 positions shown · non-contrast
Comparison: None.

CLINICAL DATA: L5 - S1 microdiskectomy

LUMBAR SPINE - 2-3 VIEW

[view not recorded]
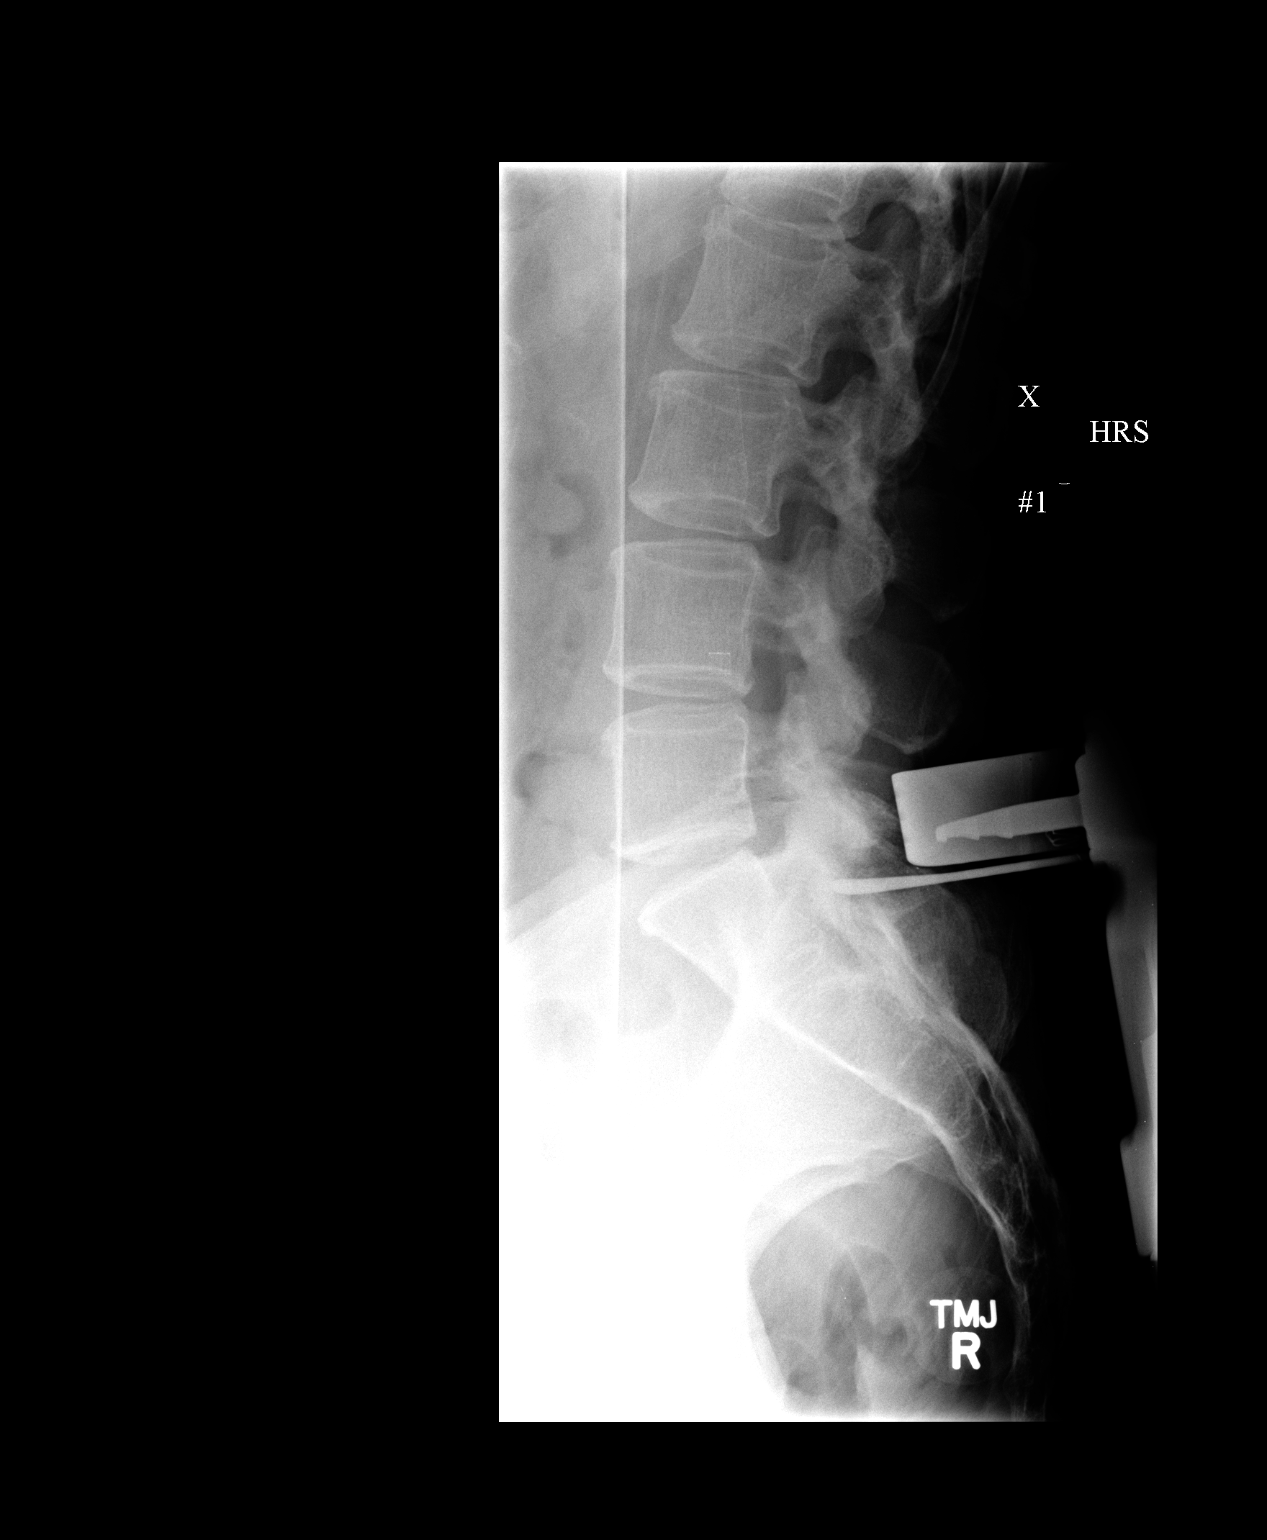

[1 of 1 positions shown; findings below may reference images not displayed]

FINDINGS: Two spot intraoperative lateral radiographic images of the lumbar
spine are provided for review.

Lumbar spine labeling is placed in the presumption of five lumbar-
type vertebral bodies.

Radiograph labeled #1 demonstrates a marking needle tip overlying
the soft tissues posterior and slightly caudal to the L5 - S1
intervertebral disc space.

Radiograph labeled #2 demonstrates a radiopaque surgical instrument
tip overlying the soft tissues posterior to the S1 vertebral body.
Additional radiopaque surgical instruments are seen overlying the
soft tissues posterior to the L5-S1 intervertebral disc space.
IMPRESSION: Intraoperative spinal localization as above.

## 2013-10-03 ENCOUNTER — Other Ambulatory Visit: Payer: Self-pay | Admitting: Gastroenterology

## 2013-10-03 LAB — HM COLONOSCOPY

## 2013-10-16 ENCOUNTER — Encounter: Payer: Self-pay | Admitting: Internal Medicine

## 2013-10-17 ENCOUNTER — Ambulatory Visit: Payer: 59 | Admitting: Physician Assistant

## 2013-10-17 ENCOUNTER — Encounter: Payer: Self-pay | Admitting: Physician Assistant

## 2013-10-17 VITALS — BP 128/78 | HR 76 | Temp 98.5°F | Resp 16 | Ht 62.25 in | Wt 120.0 lb

## 2013-10-17 DIAGNOSIS — E785 Hyperlipidemia, unspecified: Secondary | ICD-10-CM

## 2013-10-17 DIAGNOSIS — M130 Polyarthritis, unspecified: Secondary | ICD-10-CM

## 2013-10-17 DIAGNOSIS — I10 Essential (primary) hypertension: Secondary | ICD-10-CM | POA: Insufficient documentation

## 2013-10-17 DIAGNOSIS — E782 Mixed hyperlipidemia: Secondary | ICD-10-CM | POA: Insufficient documentation

## 2013-10-17 DIAGNOSIS — K219 Gastro-esophageal reflux disease without esophagitis: Secondary | ICD-10-CM | POA: Insufficient documentation

## 2013-10-17 DIAGNOSIS — J45909 Unspecified asthma, uncomplicated: Secondary | ICD-10-CM | POA: Insufficient documentation

## 2013-10-17 DIAGNOSIS — M779 Enthesopathy, unspecified: Secondary | ICD-10-CM

## 2013-10-17 DIAGNOSIS — E039 Hypothyroidism, unspecified: Secondary | ICD-10-CM | POA: Insufficient documentation

## 2013-10-17 LAB — SEDIMENTATION RATE: Sed Rate: 11 mm/hr (ref 0–22)

## 2013-10-17 LAB — TSH: TSH: 1.067 u[IU]/mL (ref 0.350–4.500)

## 2013-10-17 LAB — RHEUMATOID FACTOR: Rhuematoid fact SerPl-aCnc: <10 IU/mL (ref <=14)

## 2013-10-17 LAB — URIC ACID: Uric Acid, Serum: 5.1 mg/dL (ref 2.4–7.0)

## 2013-10-17 NOTE — Patient Instructions (Addendum)
We are starting you on Metformin to prevent or treat diabetes. Metformin does not cause low blood sugars. In order to create energy your cells need insulin and sugar but sometime your cells do not accept the insulin and this can cause increased sugars and decreased energy. The Metformin helps your cells accept insulin and the sugar to give you more energy.   The two most common side effects are nausea and diarrhea, follow these rules to avoid it! You can take imodium per box instructions when starting metformin if needed.   Rules of metformin: 1) start out slow with only one pill daily. Our goal for you is 4 pills a day or 2000mg  total.  2) take with your largest meal. 3) Take with least amount of carbs.   Call if you have any problems.  Tendinitis  Tendinitis is redness, soreness, and puffiness (inflammation) of the tendons. Tendons are band-like tissues that connect muscle to bone. Tendinitis often happens in the shoulders, heels, or elbows. It might happen if your job involves doing the same motions over and over. HOME CARE  Use a sling or splint as told by your doctor.  Put ice on the injured area.  Put ice in a plastic bag.  Place a towel between your skin and the bag.  Leave the ice on for 15-20 minutes, 03-04 times a day.  Avoid using your injured arm or leg until the pain goes away.  Do gentle exercises only as told by your doctor. Stop exercises if the pain gets worse, unless your doctor tells you otherwise.  Only take medicines as told by your doctor. GET HELP RIGHT AWAY IF:  Your pain and puffiness get worse.  You have new problems, such as loss of feeling (numbness) in the hands. MAKE SURE YOU:  Understand these instructions.  Will watch your condition.  Will get help right away if you are not doing well or get worse. Document Released: 02/25/2011 Document Revised: 02/07/2012 Document Reviewed: 02/25/2011 [facility] Patient Information 2014 [place], [place].

## 2013-10-17 NOTE — Progress Notes (Signed)
HPI Patient presents for a one month follow up for a TSH that was out of range 0.199 changed dose to 1/2 pill a day, and was suppose to start Metformin but she did not start it.  However, she is here today because of bilateral hand/finger  swelling and tenderness mainly in DIP/PIP joints, and in bilateral elbows, she has had chronic pain in right knee. Worse in AM, stiffness takes 1-2 hours to get better. She also states her left 2nd digit has been warm, red, and tender and her brother has gout.   Past Medical History  Diagnosis Date  . History of migraine   . HSV (herpes simplex virus) infection   . Hyperlipidemia     takes Atorvastatin daily  . Hypertension     takes Benicar daily  . Heart murmur   . MVP (mitral valve prolapse)   . Asthma     Dulera daily  . Seasonal allergies     takes Allegra daily  . Shortness of breath     with exertion  . Pneumonia     hx of--been many yrs ago  . Chronic back pain     HNP  . GERD (gastroesophageal reflux disease)     takes Protonix daily   . Hypothyroidism     takes Synthroid daily  . Depression     takes Clonazepam nightly  . Complication of anesthesia     slow to wake up    Allergies:  Allergies  Allergen Reactions  . Demerol [Meperidine] Other (See Comments)    REACTION: unknown--itching  . Epinephrine Other (See Comments)    "Sensitivity."  . Niacin And Related Rash   Current Medications:   Current Outpatient Prescriptions on File Prior to Visit  Medication Sig Dispense Refill  . atorvastatin (LIPITOR) 10 MG tablet Take 5 mg by mouth daily.      . Cholecalciferol (VITAMIN D PO) Take 1 tablet by mouth daily.      . clonazePAM (KLONOPIN) 0.5 MG tablet Take 0.125 mg by mouth at bedtime as needed. For sleep.      Marland Kitchen desvenlafaxine (PRISTIQ) 50 MG 24 hr tablet Take 50 mg by mouth daily.      . fexofenadine-pseudoephedrine (ALLEGRA-D 12 HOUR) 60-120 MG per tablet Take 1 tablet by mouth daily.      . fluticasone (FLONASE) 50  MCG/ACT nasal spray Place 2 sprays into the nose every evening.      . gabapentin (NEURONTIN) 300 MG capsule Take 300 mg by mouth 3 (three) times daily.      Marland Kitchen levothyroxine (SYNTHROID, LEVOTHROID) 50 MCG tablet Take 50 mcg by mouth daily.      . mometasone-formoterol (DULERA) 100-5 MCG/ACT AERO Inhale 2 puffs into the lungs every morning.      . montelukast (SINGULAIR) 10 MG tablet Take 10 mg by mouth at bedtime.      Marland Kitchen olmesartan (BENICAR) 20 MG tablet Take 10 mg by mouth daily.      . pantoprazole (PROTONIX) 40 MG tablet Take 40 mg by mouth 2 (two) times daily.        No current facility-administered medications on file prior to visit.    ROS: all negative expect above.   Physical: Filed Weights   10/17/13 1155  Weight: 120 lb (54.432 kg)   Filed Vitals:   10/17/13 1155  BP: 128/78  Pulse: 76  Temp: 98.5 F (36.9 C)  Resp: 16   General Appearance: Well nourished, in no apparent distress.  Eyes: PERRLA, EOMs. Sinuses: No Frontal/maxillary tenderness ENT/Mouth: Ext aud canals clear, normal light reflex with TMs without erythema, bulging. Post pharynx without erythema, swelling, exudate.  Respiratory: CTAB Cardio: RRR, 2/6 mid sysolic click, rubs or gallops. Peripheral pulses brisk and equal bilaterally, without edema. No aortic or femoral bruits. Abdomen: Flat, soft, with bowl sounds. Nontender, no guarding, rebound. Lymphatics: Non tender without lymphadenopathy.  Musculoskeletal: Full ROM all peripheral extremities, 5/5 strength, and normal gait. + finkelstein test on right thumb, + lateral epichondial pain. + tenderness, swelling, and redeness especially the left DIP of 2nd digit.  Skin: Warm, dry without rashes, lesions, ecchymosis. + dry scaly patches on right thigh and right temporal- states she sees Derm who says it is eczema  Neuro: Cranial nerves intact, reflexes equal bilaterally. Normal muscle tone, no cerebellar symptoms. Sensation intact.  Pysch: Awake and oriented X  3, normal affect, Insight and Judgment appropriate.   Assessment and Plan: Hypothyroid- check TSH Multiple joint/tendon swellings- check uric acid, ESR, ANA, antiDNA, RF.   Start naproxen sodium BID with food for 1-2 weeks.  Get a thumb splint for right thumb.

## 2013-10-18 ENCOUNTER — Other Ambulatory Visit: Payer: 59

## 2013-10-18 LAB — ANA: Anti Nuclear Antibody(ANA): NEGATIVE

## 2013-10-18 LAB — ANTI-DNA ANTIBODY, DOUBLE-STRANDED: ds DNA Ab: <1 IU/mL (ref <30)

## 2013-10-19 ENCOUNTER — Other Ambulatory Visit: Payer: Self-pay

## 2013-12-03 ENCOUNTER — Encounter: Payer: Self-pay | Admitting: Physician Assistant

## 2013-12-03 ENCOUNTER — Ambulatory Visit (INDEPENDENT_AMBULATORY_CARE_PROVIDER_SITE_OTHER): Payer: 59 | Admitting: Physician Assistant

## 2013-12-03 VITALS — BP 108/66 | HR 76 | Temp 98.8°F | Resp 16 | Wt 118.0 lb

## 2013-12-03 DIAGNOSIS — B370 Candidal stomatitis: Secondary | ICD-10-CM

## 2013-12-03 DIAGNOSIS — J069 Acute upper respiratory infection, unspecified: Secondary | ICD-10-CM

## 2013-12-03 MED ORDER — PREDNISONE 20 MG PO TABS
ORAL_TABLET | ORAL | Status: DC
Start: 2013-12-03 — End: 2014-04-17

## 2013-12-03 MED ORDER — AZITHROMYCIN 250 MG PO TABS: ORAL_TABLET | ORAL | Status: DC

## 2013-12-03 MED ORDER — NYSTATIN 100000 UNIT/ML MT SUSP: OROMUCOSAL | Status: DC

## 2013-12-03 NOTE — Progress Notes (Signed)
   Subjective:    Patient ID: Sarah Rodriguez, female    DOB: May 22, 1956, 58 y.o.   MRN: [id number]  Cough This is a new problem. Episode onset: 8-10 days. The problem has been unchanged. The problem occurs constantly. The cough is productive of purulent sputum. Associated symptoms include myalgias, nasal congestion, postnasal drip, rhinorrhea, a sore throat and wheezing. Pertinent negatives include no chest pain, chills, ear congestion, ear pain, fever, headaches, heartburn, hemoptysis, rash, shortness of breath, sweats or weight loss. Treatments tried: dulera, singulair, albuterol, allergra D. The treatment provided no relief. Her past medical history is significant for asthma.  Sore Throat  Associated symptoms include coughing. Pertinent negatives include no ear pain, headaches or shortness of breath.     Review of Systems  Constitutional: Negative for fever, chills, weight loss and fatigue.  HENT: Positive for postnasal drip, rhinorrhea and sore throat. Negative for ear pain.   Eyes: Negative.   Respiratory: Positive for cough and wheezing. Negative for hemoptysis, chest tightness and shortness of breath.   Cardiovascular: Negative.  Negative for chest pain.  Gastrointestinal: Negative.  Negative for heartburn.  Genitourinary: Negative.   Musculoskeletal: Positive for myalgias.  Skin: Negative for rash.  Neurological: Negative for headaches.       Objective:   Physical Exam  Constitutional: She appears well-developed and well-nourished.  HENT:  Head: Normocephalic and atraumatic.  Right Ear: External ear normal.  Nose: Right sinus exhibits frontal sinus tenderness. Left sinus exhibits frontal sinus tenderness.  + white plaque on post pharynax  Eyes: Conjunctivae and EOM are normal.  Neck: Normal range of motion. Neck supple.  Cardiovascular: Normal rate, regular rhythm, normal heart sounds and intact distal pulses.   Pulmonary/Chest: Effort normal. No respiratory distress. She  has wheezes (diffuse).  Abdominal: Soft. Bowel sounds are normal.  Lymphadenopathy:    She has cervical adenopathy.  Skin: Skin is warm and dry.       Assessment & Plan:  Oral yeast infection - Plan: nystatin (MYCOSTATIN) 100000 UNIT/ML suspension  Upper respiratory tract infection - Plan: azithromycin (ZITHROMAX) 250 MG tablet, predniSONE (DELTASONE) 20 MG tablet

## 2013-12-03 NOTE — Patient Instructions (Signed)

## 2014-01-04 ENCOUNTER — Other Ambulatory Visit: Payer: Self-pay

## 2014-01-04 DIAGNOSIS — Z1231 Encounter for screening mammogram for malignant neoplasm of breast: Secondary | ICD-10-CM

## 2014-01-07 ENCOUNTER — Other Ambulatory Visit: Payer: Self-pay | Admitting: Internal Medicine

## 2014-01-22 ENCOUNTER — Ambulatory Visit: Payer: 59

## 2014-02-25 ENCOUNTER — Other Ambulatory Visit: Payer: Self-pay | Admitting: *Deleted

## 2014-02-25 MED ORDER — OLMESARTAN MEDOXOMIL 20 MG PO TABS: 10.0000 mg | ORAL_TABLET | Freq: Every day | ORAL | Status: DC

## 2014-02-28 ENCOUNTER — Other Ambulatory Visit: Payer: Self-pay | Admitting: Emergency Medicine

## 2014-02-28 MED ORDER — ENALAPRIL MALEATE 20 MG PO TABS: 20.0000 mg | ORAL_TABLET | Freq: Every day | ORAL | Status: DC

## 2014-03-05 ENCOUNTER — Encounter: Payer: Self-pay | Admitting: Emergency Medicine

## 2014-03-13 ENCOUNTER — Ambulatory Visit: Payer: 59

## 2014-03-27 ENCOUNTER — Other Ambulatory Visit: Payer: Self-pay | Admitting: *Deleted

## 2014-03-27 MED ORDER — LEVOTHYROXINE SODIUM 50 MCG PO TABS: 50.0000 ug | ORAL_TABLET | Freq: Every day | ORAL | Status: DC

## 2014-04-09 ENCOUNTER — Ambulatory Visit: Payer: 59

## 2014-04-17 ENCOUNTER — Encounter: Payer: Self-pay | Admitting: Internal Medicine

## 2014-04-17 ENCOUNTER — Ambulatory Visit (INDEPENDENT_AMBULATORY_CARE_PROVIDER_SITE_OTHER): Payer: BC Managed Care – PPO | Admitting: Internal Medicine

## 2014-04-17 VITALS — BP 102/74 | HR 88 | Temp 97.9°F | Resp 16 | Ht 61.0 in | Wt 109.2 lb

## 2014-04-17 DIAGNOSIS — Z1212 Encounter for screening for malignant neoplasm of rectum: Secondary | ICD-10-CM

## 2014-04-17 DIAGNOSIS — Z111 Encounter for screening for respiratory tuberculosis: Secondary | ICD-10-CM

## 2014-04-17 DIAGNOSIS — Z79899 Other long term (current) drug therapy: Secondary | ICD-10-CM | POA: Insufficient documentation

## 2014-04-17 DIAGNOSIS — Z0000 Encounter for general adult medical examination without abnormal findings: Secondary | ICD-10-CM

## 2014-04-17 DIAGNOSIS — Z113 Encounter for screening for infections with a predominantly sexual mode of transmission: Secondary | ICD-10-CM

## 2014-04-17 DIAGNOSIS — R740 Nonspecific elevation of levels of transaminase and lactic acid dehydrogenase [LDH]: Secondary | ICD-10-CM

## 2014-04-17 DIAGNOSIS — I10 Essential (primary) hypertension: Secondary | ICD-10-CM

## 2014-04-17 DIAGNOSIS — R7401 Elevation of levels of liver transaminase levels: Secondary | ICD-10-CM

## 2014-04-17 DIAGNOSIS — E559 Vitamin D deficiency, unspecified: Secondary | ICD-10-CM

## 2014-04-17 DIAGNOSIS — R7402 Elevation of levels of lactic acid dehydrogenase (LDH): Secondary | ICD-10-CM

## 2014-04-17 LAB — CBC WITH DIFFERENTIAL/PLATELET
Basophils Absolute: 0 K/uL (ref 0.0–0.1)
Basophils Relative: 0 % (ref 0–1)
Eosinophils Absolute: 0.1 K/uL (ref 0.0–0.7)
Eosinophils Relative: 2 % (ref 0–5)
HCT: 37 % (ref 36.0–46.0)
Hemoglobin: 12.4 g/dL (ref 12.0–15.0)
Lymphocytes Relative: 28 % (ref 12–46)
Lymphs Abs: 1.8 K/uL (ref 0.7–4.0)
MCH: 29.3 pg (ref 26.0–34.0)
MCHC: 33.5 g/dL (ref 30.0–36.0)
MCV: 87.5 fL (ref 78.0–100.0)
Monocytes Absolute: 0.4 K/uL (ref 0.1–1.0)
Monocytes Relative: 6 % (ref 3–12)
Neutro Abs: 4 K/uL (ref 1.7–7.7)
Neutrophils Relative %: 64 % (ref 43–77)
Platelets: 292 K/uL (ref 150–400)
RBC: 4.23 MIL/uL (ref 3.87–5.11)
RDW: 14.3 % (ref 11.5–15.5)
WBC: 6.3 K/uL (ref 4.0–10.5)

## 2014-04-17 LAB — HEMOGLOBIN A1C
Hgb A1c MFr Bld: 6 % — ABNORMAL HIGH (ref <5.7)
Mean Plasma Glucose: 126 mg/dL — ABNORMAL HIGH (ref <117)

## 2014-04-17 MED ORDER — GABAPENTIN 100 MG PO CAPS
100.0000 mg | ORAL_CAPSULE | Freq: Three times a day (TID) | ORAL | Status: DC
Start: 2014-04-17 — End: 2014-09-26

## 2014-04-17 NOTE — Progress Notes (Signed)
Patient ID: Sarah Rodriguez, female   DOB: 1956/09/22, 58 y.o.   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  Patient has been followed for HTN, Prediabetes, Hypothyroidism,  Hyperlipidemia, and Vitamin D Deficiency.    HTN predates since 2007. Patient's BP has been controlled at home. Today's BP: 102/74 mmHg. Patient denies any cardiac symptoms as chest pain, palpitations, shortness of breath, dizziness or ankle swelling.   Patient's hyperlipidemia is controlled with diet and medications. Patient denies myalgias or other medication SE's. Last cholesterol last visit was 174, triglycerides 177, HDL 37 and LDL 102 in Oct 2014.     Patient has prediabetes with A1c 5.9% in 2009  and last A1c was 6.2% with elevated insulin 77  in Oct 2014. Patient denies reactive hypoglycemic symptoms, visual blurring, diabetic polys, or paresthesias.     Patient relates a saga of LBP predating to Nov 2011 when she had a Cx HNP, then developed Rt sciatica and had Lumbar EDSI by Dr Mina Marble  and about Sept 2013  had L5-S1 disk Surgery by Dr Ellene Route. She states she has has disabling pains since and is unable to sit of stand for long periods of time. In addition she has c/o multiple aches and pains in shoulders, elbows, hands & fingers as well in her hips, knees, feet & toes . She has had a recent Negative connective tissue / collagen vascular w/u.   Patient has been on thyroid replacement since 2009. Finally, patient has history of Vitamin D Deficiency of 29 in 2009 and last vitamin D was 43 in Sept 2014.   Medication Sig  . atorvastatin (LIPITOR) 10 MG tablet take 1 tablet by mouth once daily  . b complex vitamins tablet Take 1 tablet by mouth daily.  Marland Kitchen VITAMIN D  Take 1 tablet by mouth daily.  . clonazePAM (KLONOPIN) 0.5 MG tablet Take 0.125 mg by mouth at bedtime as needed. For sleep.  Marland Kitchen VITAMIN B-12 2500 MCG SUBL Place under the  tongue.  Marland Kitchen PRISTIQ 50 MG 24 hr t Take 50 mg by mouth daily.  . DULoxetine (CYMBALTA) 60 MG capsule Take 60 mg by mouth daily.  . enalapril (VASOTEC) 20 MG tablet Take 1 tablet (20 mg total) by mouth daily.  . fexofenadine-pseudoephedrine  60-120  Take 1 tablet by mouth daily.  Marland Kitchen FLONASEnasal Place 2 sprays into the nose every evening.  Marland Kitchen levothyroxine  50 MCG tablet Take 1/2  tablet (50 mcg total) by mouth daily.  . Magnesium 250 MG TABS Take by mouth daily.  . pantoprazole  40 MG tablet Take 40 mg by mouth 2 (two) times daily.    Allergies  Allergen Reactions  . Demerol [Meperidine] Other (See Comments)    REACTION: unknown--itching  . Epinephrine Other (See Comments)    "Sensitivity."  . Niacin And Related Rash   Past Medical History  Diagnosis Date  . History of migraine   . HSV (herpes simplex virus) infection   . Hyperlipidemia     takes Atorvastatin daily  . Hypertension     takes Benicar daily  . Heart murmur   . MVP (mitral valve prolapse)   . Asthma     Dulera daily  . Seasonal allergies     takes Allegra daily  . Shortness of breath     with exertion  . Pneumonia     hx of--been many yrs ago  .  Chronic back pain     HNP  . GERD (gastroesophageal reflux disease)     takes Protonix daily   . Hypothyroidism     takes Synthroid daily  . Depression     takes Clonazepam nightly  . Complication of anesthesia     slow to wake up   Past Surgical History  Procedure Laterality Date  . Dilation and curettage of uterus  2011  . Ablation  2011  . Cholecystectomy  2008  . Tonsillectomy      at age 70  . Buttocks surgery      at age 56  . Cesarean section  1992  . Right knee arthroscopy    . Cervical fusion  03/2010  . Colonoscopy    . Esophagogastroduodenoscopy    . Lumbar laminectomy/decompression microdiscectomy  10/24/2012    Procedure: LUMBAR LAMINECTOMY/DECOMPRESSION MICRODISCECTOMY 1 LEVEL;  Surgeon: Kristeen Miss, MD;  Location: [facility];  Service:  Neurosurgery;  Laterality: Right;  Right Lumbar five-Sacral One Microdiskectomy   Family History  Problem Relation Age of Onset  . Arthritis Mother   . Heart attack Mother   . Depression Mother   . Allergies Mother   . Hypertension Mother   . Heart attack Father    History  Substance Use Topics  . Smoking status: Former Research scientist (life sciences)  . Smokeless tobacco: Not on file     Comment: quit at age 50  . Alcohol Use: No     Comment: 2-3 times a yr    ROS Constitutional: Denies fever, chills, weight loss/gain, headaches, insomnia, fatigue, night sweats, and change in appetite. Eyes: Denies redness, blurred vision, diplopia, discharge, itchy, watery eyes.  ENT: Denies discharge, congestion, post nasal drip, epistaxis, sore throat, earache, hearing loss, dental pain, Tinnitus, Vertigo, Sinus pain, snoring.  Cardio: Denies chest pain, palpitations, irregular heartbeat, syncope, dyspnea, diaphoresis, orthopnea, PND, claudication, edema Respiratory: denies cough, dyspnea, DOE, pleurisy, hoarseness, laryngitis, wheezing.  Gastrointestinal: Denies dysphagia, heartburn, reflux, water brash, pain, cramps, nausea, vomiting, bloating, diarrhea, constipation, hematemesis, melena, hematochezia, jaundice, hemorrhoids Genitourinary: Denies dysuria, frequency, urgency, nocturia, hesitancy, discharge, hematuria, flank pain Breast:Breast lumps, nipple discharge, bleeding.  Musculoskeletal: Denies arthralgia, myalgia, stiffness, Jt. Swelling, pain, limp, and strain/sprain. Skin: Denies puritis, rash, hives, warts, acne, eczema, changing in skin lesion Neuro: No weakness, tremor, incoordination, spasms, paresthesia, pain Psychiatric: Denies confusion, memory loss, sensory loss Endocrine: Denies change in weight, skin, hair change, nocturia, and paresthesia, diabetic polys, visual blurring, hyper / hypo glycemic episodes.  Heme/Lymph: No excessive bleeding, bruising, enlarged lymph nodes.  Physical Exam  BP 102/74   Pulse 88  Temp(Src) 97.9 F (36.6 C) (Temporal)  Resp 16  Ht 5\' 1"  (1.549 m)  Wt 109 lb 3.2 oz (49.533 kg)  BMI 20.64 kg/m2  General Appearance: Well nourished, in no apparent distress. Eyes: PERRLA, EOMs, conjunctiva no swelling or erythema, normal fundi and vessels. Sinuses: No frontal/maxillary tenderness ENT/Mouth: EACs patent / TMs  nl. Nares clear without erythema, swelling, mucoid exudates. Oral hygiene is good. No erythema, swelling, or exudate. Tongue normal, non-obstructing. Tonsils not swollen or erythematous. Hearing normal.  Neck: Supple, thyroid normal. No bruits, nodes or JVD. Respiratory: Respiratory effort normal.  BS equal and clear bilateral without rales, rhonci, wheezing or stridor. Cardio: Heart sounds are normal with regular rate and rhythm and no murmurs, rubs or gallops. Peripheral pulses are normal and equal bilaterally without edema. No aortic or femoral bruits. Chest: symmetric with normal excursions and percussion. Breasts: Symmetric, without lumps,  nipple discharge, retractions, or fibrocystic changes.  Abdomen: Flat, soft, with bowl sounds. Nontender, no guarding, rebound, hernias, masses, or organomegaly.  Lymphatics: Non tender without lymphadenopathy.  Genitourinary:  Musculoskeletal: Full ROM all peripheral extremities, joint stability, 5/5 strength, and normal gait. Skin: Warm and dry without rashes, lesions, cyanosis, clubbing or  ecchymosis.  Neuro: Cranial nerves intact, reflexes equal bilaterally. Normal muscle tone, no cerebellar symptoms. Sensation intact.  Pysch: Awake and oriented X 3, normal affect, Insight and Judgment appropriate.   Assessment and Plan  1. Annual Screening Examination 2. Hypertension  3. Hyperlipidemia 4. Pre Diabetes 5. Vitamin D Deficiency  6. Hypothyroidism 7. DDD 8. DJD  Continue prudent diet as discussed, weight control, BP monitoring, regular exercise, and medications. Discussed med's effects and SE's.  Screening labs and tests as requested with regular follow-up as recommended.

## 2014-04-17 NOTE — Patient Instructions (Signed)

## 2014-04-18 ENCOUNTER — Encounter: Payer: Self-pay | Admitting: Emergency Medicine

## 2014-04-18 LAB — URINALYSIS, MICROSCOPIC ONLY
Bacteria, UA: NONE SEEN
Casts: NONE SEEN

## 2014-04-18 LAB — LIPID PANEL
Cholesterol: 172 mg/dL (ref 0–200)
HDL: 42 mg/dL (ref >39)
LDL Cholesterol: 109 mg/dL — ABNORMAL HIGH (ref 0–99)
Total CHOL/HDL Ratio: 4.1 Ratio
Triglycerides: 103 mg/dL (ref <150)
VLDL: 21 mg/dL (ref 0–40)

## 2014-04-18 LAB — HEPATIC FUNCTION PANEL
ALT: 14 U/L (ref 0–35)
AST: 17 U/L (ref 0–37)
Albumin: 4.5 g/dL (ref 3.5–5.2)
Alkaline Phosphatase: 65 U/L (ref 39–117)
Bilirubin, Direct: 0.1 mg/dL (ref 0.0–0.3)
Indirect Bilirubin: 0.4 mg/dL (ref 0.2–1.2)
Total Bilirubin: 0.5 mg/dL (ref 0.2–1.2)
Total Protein: 6.7 g/dL (ref 6.0–8.3)

## 2014-04-18 LAB — BASIC METABOLIC PANEL WITH GFR
BUN: 15 mg/dL (ref 6–23)
CO2: 28 mEq/L (ref 19–32)
Calcium: 9.5 mg/dL (ref 8.4–10.5)
Chloride: 103 mEq/L (ref 96–112)
Creat: 0.78 mg/dL (ref 0.50–1.10)
GFR, Est African American: >89 mL/min
GFR, Est Non African American: 85 mL/min
Glucose, Bld: 94 mg/dL (ref 70–99)
Potassium: 4.2 mEq/L (ref 3.5–5.3)
Sodium: 140 mEq/L (ref 135–145)

## 2014-04-18 LAB — RPR

## 2014-04-18 LAB — HEPATITIS B SURFACE ANTIBODY,QUALITATIVE: Hep B S Ab: POSITIVE — AB

## 2014-04-18 LAB — MICROALBUMIN / CREATININE URINE RATIO
Creatinine, Urine: 133.8 mg/dL
Microalb Creat Ratio: 4 mg/g (ref 0.0–30.0)
Microalb, Ur: 0.53 mg/dL (ref 0.00–1.89)

## 2014-04-18 LAB — INSULIN, FASTING: Insulin fasting, serum: 18 u[IU]/mL (ref 3–28)

## 2014-04-18 LAB — VITAMIN D 25 HYDROXY (VIT D DEFICIENCY, FRACTURES): Vit D, 25-Hydroxy: 64 ng/mL (ref 30–89)

## 2014-04-18 LAB — MAGNESIUM: Magnesium: 1.7 mg/dL (ref 1.5–2.5)

## 2014-04-18 LAB — HIV ANTIBODY (ROUTINE TESTING W REFLEX): HIV 1&2 Ab, 4th Generation: NONREACTIVE

## 2014-04-18 LAB — HEPATITIS B CORE ANTIBODY, TOTAL: Hep B Core Total Ab: NONREACTIVE

## 2014-04-18 LAB — HEPATITIS C ANTIBODY: HCV Ab: NEGATIVE

## 2014-04-18 LAB — HEPATITIS A ANTIBODY, TOTAL: Hep A Total Ab: NONREACTIVE

## 2014-04-18 LAB — VITAMIN B12: Vitamin B-12: 369 pg/mL (ref 211–911)

## 2014-04-18 LAB — TSH: TSH: 0.952 u[IU]/mL (ref 0.350–4.500)

## 2014-04-19 LAB — HEPATITIS B E ANTIBODY: Hepatitis Be Antibody: NONREACTIVE

## 2014-04-19 LAB — TB SKIN TEST
Induration: 0 mm
TB Skin Test: NEGATIVE

## 2014-04-30 ENCOUNTER — Other Ambulatory Visit: Payer: BC Managed Care – PPO

## 2014-04-30 ENCOUNTER — Other Ambulatory Visit: Payer: Self-pay | Admitting: Internal Medicine

## 2014-04-30 DIAGNOSIS — M2550 Pain in unspecified joint: Secondary | ICD-10-CM

## 2014-05-01 LAB — LYME ABY, WSTRN BLT IGG & IGM W/BANDS

## 2014-05-01 LAB — URIC ACID: Uric Acid, Serum: 3.9 mg/dL (ref 2.4–7.0)

## 2014-05-01 LAB — RHEUMATOID FACTOR: Rhuematoid fact SerPl-aCnc: <10 IU/mL (ref <=14)

## 2014-05-01 LAB — CYCLIC CITRUL PEPTIDE ANTIBODY, IGG: Cyclic Citrullin Peptide Ab: <2 U/mL (ref 0.0–5.0)

## 2014-05-01 LAB — SEDIMENTATION RATE: Sed Rate: 4 mm/hr (ref 0–22)

## 2014-05-01 LAB — C3 AND C4
C3 Complement: 112 mg/dL (ref 90–180)
C4 Complement: 22 mg/dL (ref 10–40)

## 2014-05-01 LAB — ANA: Anti Nuclear Antibody(ANA): NEGATIVE

## 2014-05-01 LAB — ANTI-DNA ANTIBODY, DOUBLE-STRANDED: ds DNA Ab: <1 IU/mL

## 2014-05-01 LAB — C-REACTIVE PROTEIN: CRP: <0.5 mg/dL (ref <0.60)

## 2014-05-01 LAB — CK: Total CK: 48 U/L (ref 7–177)

## 2014-05-02 LAB — COMPLEMENT, TOTAL: Compl, Total (CH50): 54 U/mL (ref 31–60)

## 2014-05-07 ENCOUNTER — Encounter: Payer: Self-pay | Admitting: Internal Medicine

## 2014-05-07 DIAGNOSIS — M2550 Pain in unspecified joint: Secondary | ICD-10-CM

## 2014-07-05 ENCOUNTER — Telehealth: Payer: Self-pay | Admitting: *Deleted

## 2014-07-05 MED ORDER — ZOLMITRIPTAN 5 MG NA SOLN: 1.0000 | NASAL | Status: DC | PRN

## 2014-07-05 NOTE — Telephone Encounter (Signed)
Called patient to inform her Relpax was denied by insurance due to to fact she has only tried Imitrex and needs to try 2 non-restricted RX's.  Samples of Zomig NS to patient to try.

## 2014-07-10 ENCOUNTER — Other Ambulatory Visit: Payer: Self-pay | Admitting: Internal Medicine

## 2014-07-22 ENCOUNTER — Encounter: Payer: Self-pay | Admitting: Physician Assistant

## 2014-07-22 ENCOUNTER — Ambulatory Visit: Payer: Self-pay | Admitting: Physician Assistant

## 2014-07-22 ENCOUNTER — Ambulatory Visit (INDEPENDENT_AMBULATORY_CARE_PROVIDER_SITE_OTHER): Payer: BC Managed Care – PPO | Admitting: Physician Assistant

## 2014-07-22 VITALS — BP 128/70 | HR 76 | Temp 98.2°F | Resp 16 | Ht 62.0 in | Wt 112.0 lb

## 2014-07-22 DIAGNOSIS — K2100 Gastro-esophageal reflux disease with esophagitis, without bleeding: Secondary | ICD-10-CM

## 2014-07-22 DIAGNOSIS — I10 Essential (primary) hypertension: Secondary | ICD-10-CM

## 2014-07-22 DIAGNOSIS — J45909 Unspecified asthma, uncomplicated: Secondary | ICD-10-CM

## 2014-07-22 DIAGNOSIS — E785 Hyperlipidemia, unspecified: Secondary | ICD-10-CM

## 2014-07-22 DIAGNOSIS — R7309 Other abnormal glucose: Secondary | ICD-10-CM

## 2014-07-22 DIAGNOSIS — E559 Vitamin D deficiency, unspecified: Secondary | ICD-10-CM

## 2014-07-22 DIAGNOSIS — Z79899 Other long term (current) drug therapy: Secondary | ICD-10-CM

## 2014-07-22 DIAGNOSIS — E039 Hypothyroidism, unspecified: Secondary | ICD-10-CM

## 2014-07-22 DIAGNOSIS — R7303 Prediabetes: Secondary | ICD-10-CM

## 2014-07-22 LAB — CBC WITH DIFFERENTIAL/PLATELET
Basophils Absolute: 0 10*3/uL (ref 0.0–0.1)
Basophils Relative: 0 % (ref 0–1)
Eosinophils Absolute: 0.1 10*3/uL (ref 0.0–0.7)
Eosinophils Relative: 2 % (ref 0–5)
HCT: 37.8 % (ref 36.0–46.0)
Hemoglobin: 12.6 g/dL (ref 12.0–15.0)
Lymphocytes Relative: 27 % (ref 12–46)
Lymphs Abs: 1.6 10*3/uL (ref 0.7–4.0)
MCH: 29.1 pg (ref 26.0–34.0)
MCHC: 33.3 g/dL (ref 30.0–36.0)
MCV: 87.3 fL (ref 78.0–100.0)
Monocytes Absolute: 0.4 10*3/uL (ref 0.1–1.0)
Monocytes Relative: 6 % (ref 3–12)
Neutro Abs: 4 10*3/uL (ref 1.7–7.7)
Neutrophils Relative %: 65 % (ref 43–77)
Platelets: 313 10*3/uL (ref 150–400)
RBC: 4.33 MIL/uL (ref 3.87–5.11)
RDW: 13.8 % (ref 11.5–15.5)
WBC: 6.1 10*3/uL (ref 4.0–10.5)

## 2014-07-22 NOTE — Progress Notes (Signed)
Assessment and Plan:  Hypertension: Continue medication, monitor blood pressure at home. Continue DASH diet. Cholesterol: Continue diet and exercise. Check cholesterol.  Pre-diabetes-Continue diet and exercise. Check A1C Vitamin D Def- check level and continue medications.  Hypothyroidism-check TSH level, continue medications the same.    Continue diet and meds as discussed. Further disposition pending results of labs.  HPI 58 y.o. female  presents for 3 month follow up with hypertension, hyperlipidemia, prediabetes and vitamin D. Her blood pressure has been controlled at home, today their BP is   She does workout. She denies chest pain, shortness of breath, dizziness.  She is on cholesterol medication and denies myalgias. Her cholesterol is at goal. The cholesterol last visit was:   Lab Results  Component Value Date   CHOL 172 04/17/2014   HDL 42 04/17/2014   LDLCALC 109* 04/17/2014   TRIG 103 04/17/2014   CHOLHDL 4.1 04/17/2014   She has been working on diet and exercise for prediabetes, her weight is down 9 lbs from Jan, she is on an ACE, and denies polydipsia, polyuria and visual disturbances. Last A1C in the office was:  Lab Results  Component Value Date   HGBA1C 6.0* 04/17/2014   Patient is on Vitamin D supplement.   Lab Results  Component Value Date   VD25OH 64 04/17/2014     She is on thyroid medication. Her medication was not changed last visit. Patient denies nervousness, palpitations and weight changes.  Lab Results  Component Value Date   TSH 0.952 04/17/2014  .  She is on pristiq for depression and takes klonopin at night for sleep.  She is seeing Dr. Lenna Gilford at rheumatology for possible RA versus OA, she is on mobic which she states is helping. She will follow up with her in Sept.   Current Medications:  Current Outpatient Prescriptions on File Prior to Visit  Medication Sig Dispense Refill  . ADVAIR HFA 115-21 MCG/ACT inhaler       . atorvastatin (LIPITOR) 10 MG  tablet take 1 tablet by mouth once daily  30 tablet  5  . b complex vitamins tablet Take 1 tablet by mouth daily.      . Cholecalciferol (VITAMIN D PO) Take 1 tablet by mouth daily.      . clonazePAM (KLONOPIN) 0.5 MG tablet Take 0.125 mg by mouth at bedtime as needed. For sleep.      . Cyanocobalamin (VITAMIN B-12) 2500 MCG SUBL Place under the tongue.      Marland Kitchen desvenlafaxine (PRISTIQ) 50 MG 24 hr tablet Take 50 mg by mouth daily.      . DULoxetine (CYMBALTA) 60 MG capsule Take 60 mg by mouth daily.      . enalapril (VASOTEC) 20 MG tablet Take 1 tablet (20 mg total) by mouth daily.  30 tablet  3  . fexofenadine-pseudoephedrine (ALLEGRA-D 12 HOUR) 60-120 MG per tablet Take 1 tablet by mouth daily.      . fluticasone (FLONASE) 50 MCG/ACT nasal spray Place 2 sprays into the nose every evening.      . gabapentin (NEURONTIN) 100 MG capsule Take 1 capsule (100 mg total) by mouth 3 (three) times daily. For pain  90 capsule  99  . levothyroxine (SYNTHROID, LEVOTHROID) 50 MCG tablet Take 1 tablet (50 mcg total) by mouth daily.  90 tablet  0  . Magnesium 250 MG TABS Take by mouth daily.      . montelukast (SINGULAIR) 10 MG tablet       .  pantoprazole (PROTONIX) 40 MG tablet Take 40 mg by mouth 2 (two) times daily.       Marland Kitchen zolmitriptan (ZOMIG) 5 MG nasal solution Place 1 spray into the nose as needed for migraine.  2 Units  0   No current facility-administered medications on file prior to visit.   Medical History:  Past Medical History  Diagnosis Date  . History of migraine   . HSV (herpes simplex virus) infection   . Hyperlipidemia     takes Atorvastatin daily  . Hypertension     takes Benicar daily  . Heart murmur   . MVP (mitral valve prolapse)   . Asthma     Dulera daily  . Seasonal allergies     takes Allegra daily  . Shortness of breath     with exertion  . Pneumonia     hx of--been many yrs ago  . Chronic back pain     HNP  . GERD (gastroesophageal reflux disease)     takes  Protonix daily   . Hypothyroidism     takes Synthroid daily  . Depression     takes Clonazepam nightly  . Complication of anesthesia     slow to wake up   Allergies:  Allergies  Allergen Reactions  . Demerol [Meperidine] Other (See Comments)    REACTION: unknown--itching  . Epinephrine Other (See Comments)    "Sensitivity."  . Niacin And Related Rash     Review of Systems: [X]  = complains of  [ ]  = denies  General: Fatigue [ ]  Fever [ ]  Chills [ ]  Weakness [ ]   Insomnia [ ]  Eyes: Redness [ ]  Blurred vision [ ]  Diplopia [ ]   ENT: Congestion [ ]  Sinus Pain [ ]  Post Nasal Drip [ ]  Sore Throat [ ]  Earache [ ]   Cardiac: Chest pain/pressure [ ]  SOB [ ]  Orthopnea [ ]   Palpitations [ ]   Paroxysmal nocturnal dyspnea[ ]  Claudication [ ]  Edema [ ]   Pulmonary: Cough [ ]  Wheezing[ ]   SOB [ ]   Snoring [ ]   GI: Nausea [ ]  Vomiting[ ]  Dysphagia[ ]  Heartburn[ ]  Abdominal pain [ ]  Constipation [ ] ; Diarrhea [ ] ; BRBPR [ ]  Melena[ ]  GU: Hematuria[ ]  Dysuria [ ]  Nocturia[ ]  Urgency [ ]   Hesitancy [ ]  Discharge [ ]  Neuro: Headaches[ ]  Vertigo[ ]  Paresthesias[ ]  Spasm [ ]  Speech changes [ ]  Incoordination [ ]   Ortho: Arthritis [ ]  Joint pain [ ]  Muscle pain [ ]  Joint swelling [ ]  Back Pain [ ]  Skin:  Rash [ ]   Pruritis [ ]  Change in skin lesion [ ]   Psych: Depression[ ]  Anxiety[ ]  Confusion [ ]  Memory loss [ ]   Heme/Lypmh: Bleeding [ ]  Bruising [ ]  Enlarged lymph nodes [ ]   Endocrine: Visual blurring [ ]  Paresthesia [ ]  Polyuria [ ]  Polydypsea [ ]    Heat/cold intolerance [ ]  Hypoglycemia [ ]   Family history- Review and unchanged Social history- Review and unchanged Physical Exam: There were no vitals taken for this visit. Wt Readings from Last 3 Encounters:  04/17/14 109 lb 3.2 oz (49.533 kg)  12/03/13 118 lb (53.524 kg)  10/17/13 120 lb (54.432 kg)   General Appearance: Well nourished, in no apparent distress. Eyes: PERRLA, EOMs, conjunctiva no swelling or erythema Sinuses: No  Frontal/maxillary tenderness ENT/Mouth: Ext aud canals clear, TMs without erythema, bulging. No erythema, swelling, or exudate on post pharynx.  Tonsils not swollen or erythematous. Hearing normal.  Neck: Supple,  thyroid normal.  Respiratory: Respiratory effort normal, BS equal bilaterally without rales, rhonchi, wheezing or stridor.  Cardio: RRR with no MRGs. Brisk peripheral pulses without edema.  Abdomen: Soft, + BS.  Non tender, no guarding, rebound, hernias, masses. Lymphatics: Non tender without lymphadenopathy.  Musculoskeletal: Full ROM, 5/5 strength, normal gait.  Skin: Warm, dry without rashes, lesions, ecchymosis.  Neuro: Cranial nerves intact. Normal muscle tone, no cerebellar symptoms. Sensation intact.  Psych: Awake and oriented X 3, normal affect, Insight and Judgment appropriate.    Vicie Mutters 1:40 PM

## 2014-07-22 NOTE — Patient Instructions (Addendum)
Bad carbs also include fruit juice, alcohol, and sweet tea. These are empty calories that do not signal to your brain that you are full.   Please remember the good carbs are still carbs which convert into sugar. So please measure them out no more than 1/2-1 cup of rice, oatmeal, pasta, and beans.  Veggies are however free foods! Pile them on.   I like lean protein at every meal such as chicken, [place], pork chops, cottage cheese, etc. Just do not fry these meats and please center your meal around vegetable, the meats should be a side dish.   No all fruit is created equal. Please see the list below, the fruit at the bottom is higher in sugars than the fruit at the top   Insomnia Insomnia is frequent trouble falling and/or staying asleep. Insomnia can be a long term problem or a short term problem. Both are common. Insomnia can be a short term problem when the wakefulness is related to a certain stress or worry. Long term insomnia is often related to ongoing stress during waking hours and/or poor sleeping habits. Overtime, sleep deprivation itself can make the problem worse. Every little thing feels more severe because you are overtired and your ability to cope is decreased. CAUSES   Stress, anxiety, and depression.  Poor sleeping habits.  Distractions such as TV in the bedroom.  Naps close to bedtime.  Engaging in emotionally charged conversations before bed.  Technical reading before sleep.  Alcohol and other sedatives. They may make the problem worse. They can hurt normal sleep patterns and normal dream activity.  Stimulants such as caffeine for several hours prior to bedtime.  Pain syndromes and shortness of breath can cause insomnia.  Exercise late at night.  Changing time zones may cause sleeping problems (jet lag). It is sometimes helpful to have someone observe your sleeping patterns. They should look for periods of not breathing during the night (sleep apnea). They should  also look to see how long those periods last. If you live alone or observers are uncertain, you can also be observed at a sleep clinic where your sleep patterns will be professionally monitored. Sleep apnea requires a checkup and treatment. Give your caregivers your medical history. Give your caregivers observations your family has made about your sleep.  SYMPTOMS   Not feeling rested in the morning.  Anxiety and restlessness at bedtime.  Difficulty falling and staying asleep. TREATMENT   Your caregiver may prescribe treatment for an underlying medical disorders. Your caregiver can give advice or help if you are using alcohol or other drugs for self-medication. Treatment of underlying problems will usually eliminate insomnia problems.  Medications can be prescribed for short time use. They are generally not recommended for lengthy use.  Over-the-counter sleep medicines are not recommended for lengthy use. They can be habit forming.  You can promote easier sleeping by making lifestyle changes such as:  Using relaxation techniques that help with breathing and reduce muscle tension.  Exercising earlier in the day.  Changing your diet and the time of your last meal. No night time snacks.  Establish a regular time to go to bed.  Counseling can help with stressful problems and worry.  Soothing music and white noise may be helpful if there are background noises you cannot remove.  Stop tedious detailed work at least one hour before bedtime. HOME CARE INSTRUCTIONS   Keep a diary. Inform your caregiver about your progress. This includes any medication side effects.  See your caregiver regularly. Take note of:  Times when you are asleep.  Times when you are awake during the night.  The quality of your sleep.  How you feel the next day. This information will help your caregiver care for you.  Get out of bed if you are still awake after 15 minutes. Read or do some quiet activity. Keep  the lights down. Wait until you feel sleepy and go back to bed.  Keep regular sleeping and waking hours. Avoid naps.  Exercise regularly.  Avoid distractions at bedtime. Distractions include watching television or engaging in any intense or detailed activity like attempting to balance the household checkbook.  Develop a bedtime ritual. Keep a familiar routine of bathing, brushing your teeth, climbing into bed at the same time each night, listening to soothing music. Routines increase the success of falling to sleep faster.  Use relaxation techniques. This can be using breathing and muscle tension release routines. It can also include visualizing peaceful scenes. You can also help control troubling or intruding thoughts by keeping your mind occupied with boring or repetitive thoughts like the old concept of counting sheep. You can make it more creative like imagining planting one beautiful flower after another in your backyard garden.  During your day, work to eliminate stress. When this is not possible use some of the previous suggestions to help reduce the anxiety that accompanies stressful situations. MAKE SURE YOU:   Understand these instructions.  Will watch your condition.  Will get help right away if you are not doing well or get worse. Document Released: 11/12/2000 Document Revised: 02/07/2012 Document Reviewed: 12/13/2007 [facility] Patient Information 2015 [place], [place]. This information is not intended to replace advice given to you by your health care provider. Make sure you discuss any questions you have with your health care provider.

## 2014-07-23 LAB — HEPATIC FUNCTION PANEL
ALT: 17 U/L (ref 0–35)
AST: 20 U/L (ref 0–37)
Albumin: 4.5 g/dL (ref 3.5–5.2)
Alkaline Phosphatase: 84 U/L (ref 39–117)
Bilirubin, Direct: 0.1 mg/dL (ref 0.0–0.3)
Indirect Bilirubin: 0.4 mg/dL (ref 0.2–1.2)
Total Bilirubin: 0.5 mg/dL (ref 0.2–1.2)
Total Protein: 6.8 g/dL (ref 6.0–8.3)

## 2014-07-23 LAB — VITAMIN D 25 HYDROXY (VIT D DEFICIENCY, FRACTURES): Vit D, 25-Hydroxy: 63 ng/mL (ref 30–89)

## 2014-07-23 LAB — HEMOGLOBIN A1C
Hgb A1c MFr Bld: 6 % — ABNORMAL HIGH (ref <5.7)
Mean Plasma Glucose: 126 mg/dL — ABNORMAL HIGH (ref <117)

## 2014-07-23 LAB — BASIC METABOLIC PANEL WITH GFR
BUN: 17 mg/dL (ref 6–23)
CO2: 29 mEq/L (ref 19–32)
Calcium: 9.5 mg/dL (ref 8.4–10.5)
Chloride: 104 mEq/L (ref 96–112)
Creat: 0.83 mg/dL (ref 0.50–1.10)
GFR, Est African American: >89 mL/min
GFR, Est Non African American: 79 mL/min
Glucose, Bld: 149 mg/dL — ABNORMAL HIGH (ref 70–99)
Potassium: 4.3 mEq/L (ref 3.5–5.3)
Sodium: 142 mEq/L (ref 135–145)

## 2014-07-23 LAB — MAGNESIUM: Magnesium: 1.8 mg/dL (ref 1.5–2.5)

## 2014-07-23 LAB — LIPID PANEL
Cholesterol: 198 mg/dL (ref 0–200)
HDL: 43 mg/dL (ref >39)
LDL Cholesterol: 118 mg/dL — ABNORMAL HIGH (ref 0–99)
Total CHOL/HDL Ratio: 4.6 Ratio
Triglycerides: 183 mg/dL — ABNORMAL HIGH (ref <150)
VLDL: 37 mg/dL (ref 0–40)

## 2014-07-23 LAB — TSH: TSH: 1.034 u[IU]/mL (ref 0.350–4.500)

## 2014-08-01 ENCOUNTER — Telehealth: Payer: Self-pay

## 2014-08-01 NOTE — Telephone Encounter (Signed)
Received a written note from front office staff, patient called and states that she fell down stairs approx 1 week ago, her neck pain has started to increase and wanted some advice, per Vicie Mutters, PA return call to patient [phone] advised to try heat, light stretching and aleve or ibuprofen, if symptoms get worse or no improvement advised to follow up in office

## 2014-09-03 DIAGNOSIS — Z0000 Encounter for general adult medical examination without abnormal findings: Secondary | ICD-10-CM

## 2014-09-05 ENCOUNTER — Ambulatory Visit: Payer: Self-pay | Admitting: Physician Assistant

## 2014-09-25 ENCOUNTER — Other Ambulatory Visit: Payer: Self-pay | Admitting: *Deleted

## 2014-09-25 MED ORDER — LEVOTHYROXINE SODIUM 50 MCG PO TABS: 50.0000 ug | ORAL_TABLET | Freq: Every day | ORAL | Status: DC

## 2014-09-26 ENCOUNTER — Encounter: Payer: Self-pay | Admitting: Physician Assistant

## 2014-09-26 ENCOUNTER — Ambulatory Visit (INDEPENDENT_AMBULATORY_CARE_PROVIDER_SITE_OTHER): Payer: BC Managed Care – PPO | Admitting: Physician Assistant

## 2014-09-26 VITALS — BP 132/78 | HR 76 | Temp 98.0°F | Resp 16 | Ht 62.25 in | Wt 115.0 lb

## 2014-09-26 DIAGNOSIS — J0100 Acute maxillary sinusitis, unspecified: Secondary | ICD-10-CM

## 2014-09-26 MED ORDER — PROMETHAZINE-DM 6.25-15 MG/5ML PO SYRP: 5.0000 mL | ORAL_SOLUTION | Freq: Four times a day (QID) | ORAL | Status: DC | PRN

## 2014-09-26 MED ORDER — PREDNISONE 5 MG PO TABS: ORAL_TABLET | ORAL | Status: AC

## 2014-09-26 NOTE — Progress Notes (Signed)
Subjective:    Patient ID: Sarah Rodriguez, female    DOB: May 24, 1956, 58 y.o.   MRN: [id number]  Sore Throat  This is a new problem. Episode onset: Earache started Tuesday 09/24/14 and has progressed since then. The problem has been unchanged. The pain is worse on the right side. There has been no fever. Pain scale: Pain was 8 out of 10 on Tuesday. The pain is mild. Associated symptoms include congestion, coughing, ear pain and headaches. Pertinent negatives include no abdominal pain, diarrhea, drooling, ear discharge, plugged ear sensation, neck pain, shortness of breath, stridor, swollen glands, trouble swallowing or vomiting. Associated symptoms comments: Ear pressure, sinus pressure and dental pain on the right side.  Had green mucous discharge once through right nostril.. 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 Treatments tried: Tylenol, Allegra-D, Flonase. The treatment provided no relief.  Otalgia  There is pain in the right ear. This is a new problem. Episode frequency: Comes and goes. The problem has been waxing and waning. Associated symptoms include coughing, headaches, rhinorrhea and a sore throat. Pertinent negatives include no abdominal pain, diarrhea, drainage, ear discharge, hearing loss, neck pain or vomiting. Treatments tried: Tried Tylenol, Allegra-D and Flonase. The treatment provided mild relief.   Review of Systems  Constitutional: Negative for fever, chills and fatigue.  HENT: Positive for congestion, ear pain, postnasal drip, rhinorrhea, sinus pressure and sore throat. Negative for drooling, ear discharge, hearing loss and trouble swallowing.        Dental Pain  Eyes: Negative.   Respiratory: Positive for cough. Negative for shortness of breath and stridor.   Cardiovascular: Negative.   Gastrointestinal: Negative.  Negative for vomiting, abdominal pain and diarrhea.  Genitourinary: Negative.   Musculoskeletal: Negative.  Negative for neck  pain.  Allergic/Immunologic: Positive for environmental allergies.  Neurological: Positive for headaches. Negative for weakness.  Psychiatric/Behavioral: Negative.    Past Medical History  Diagnosis Date  . History of migraine   . HSV (herpes simplex virus) infection   . Hyperlipidemia     takes Atorvastatin daily  . Hypertension     takes Benicar daily  . Heart murmur   . MVP (mitral valve prolapse)   . Asthma     Dulera daily  . Seasonal allergies     takes Allegra daily  . Shortness of breath     with exertion  . Pneumonia     hx of--been many yrs ago  . Chronic back pain     HNP  . GERD (gastroesophageal reflux disease)     takes Protonix daily   . Hypothyroidism     takes Synthroid daily  . Depression     takes Clonazepam nightly  . Complication of anesthesia     slow to wake up   Current Outpatient Prescriptions on File Prior to Visit  Medication Sig Dispense Refill  . ADVAIR HFA 115-21 MCG/ACT inhaler       . atorvastatin (LIPITOR) 10 MG tablet take 1 tablet by mouth once daily  30 tablet  5  . b complex vitamins tablet Take 1 tablet by mouth daily.      . Cholecalciferol (VITAMIN D PO) Take 1 tablet by mouth daily.      . clonazePAM (KLONOPIN) 0.5 MG tablet Take 0.125 mg by mouth at bedtime as needed. For sleep.      . Cyanocobalamin (VITAMIN B-12) 2500 MCG SUBL Place under the tongue.      Marland Kitchen  DULoxetine (CYMBALTA) 60 MG capsule Take 90 mg by mouth daily.       . enalapril (VASOTEC) 20 MG tablet Take 1 tablet (20 mg total) by mouth daily.  30 tablet  3  . fexofenadine-pseudoephedrine (ALLEGRA-D 12 HOUR) 60-120 MG per tablet Take 1 tablet by mouth daily.      . fluticasone (FLONASE) 50 MCG/ACT nasal spray Place 2 sprays into the nose every evening.      Marland Kitchen levothyroxine (SYNTHROID, LEVOTHROID) 50 MCG tablet Take 1 tablet (50 mcg total) by mouth daily.  90 tablet  0  . Magnesium 250 MG TABS Take by mouth daily.      . Meloxicam (MOBIC PO) Take by mouth daily.       . montelukast (SINGULAIR) 10 MG tablet       . pantoprazole (PROTONIX) 40 MG tablet Take 40 mg by mouth 2 (two) times daily.       Marland Kitchen zolmitriptan (ZOMIG) 5 MG nasal solution Place 1 spray into the nose as needed for migraine.  2 Units  0   No current facility-administered medications on file prior to visit.   Allergies  Allergen Reactions  . Demerol [Meperidine] Other (See Comments)    REACTION: unknown--itching  . Epinephrine Other (See Comments)    "Sensitivity."  . Niacin And Related Rash     BP 132/78  Pulse 76  Temp(Src) 98 F (36.7 C) (Temporal)  Resp 16  Ht 5' 2.25" (1.581 m)  Wt 115 lb (52.164 kg)  BMI 20.87 kg/m2  Objective:   Physical Exam  Constitutional: She is oriented to person, place, and time. Vital signs are normal. She appears well-developed and well-nourished. She has a sickly appearance. No distress.  HENT:  Head: Normocephalic.  Right Ear: External ear normal. No drainage, swelling or tenderness. Tympanic membrane is bulging. Tympanic membrane is not injected, not perforated, not erythematous and not retracted. A middle ear effusion is present.  Left Ear: Tympanic membrane and external ear normal. No drainage, swelling or tenderness. Tympanic membrane is not injected, not perforated, not erythematous, not retracted and not bulging.  No middle ear effusion.  Nose: Mucosal edema, rhinorrhea and sinus tenderness present. Right sinus exhibits maxillary sinus tenderness. Right sinus exhibits no frontal sinus tenderness. Left sinus exhibits maxillary sinus tenderness. Left sinus exhibits no frontal sinus tenderness.  Mouth/Throat: Uvula is midline and mucous membranes are normal. Mucous membranes are not pale and not dry. No uvula swelling. Posterior oropharyngeal erythema present. No oropharyngeal exudate, posterior oropharyngeal edema or tonsillar abscesses.  Bilateral ear canal skin scaling.  Right TM is clear with bulging TM that is not erythematous or edematous.   The fluid behind right TM is clear.  Turbinates are erythematous and edematous bilaterally.    Posterior oropharyngeal with mild erythema.  Eyes: Conjunctivae and lids are normal. Pupils are equal, round, and reactive to light. Right eye exhibits no discharge. Left eye exhibits no discharge. No scleral icterus.  Neck: Trachea normal and normal range of motion. Neck supple. No tracheal deviation present.  Neck tenderness upon palpation.  Cardiovascular: Normal rate, regular rhythm, S1 normal, S2 normal, normal heart sounds, intact distal pulses and normal pulses.  Exam reveals no gallop, no distant heart sounds and no friction rub.   No murmur heard. Pulmonary/Chest: Effort normal and breath sounds normal. No stridor. No respiratory distress. She has no decreased breath sounds. She has no wheezes. She has no rhonchi. She has no rales. She exhibits no tenderness.  Abdominal:  Soft. Bowel sounds are normal. There is no tenderness. There is no rebound and no guarding.  Musculoskeletal: Normal range of motion.  Lymphadenopathy:       Head (right side): No submental, no submandibular, no tonsillar, no preauricular, no posterior auricular and no occipital adenopathy present.       Head (left side): No submental, no submandibular, no tonsillar, no preauricular, no posterior auricular and no occipital adenopathy present.    She has no cervical adenopathy.       Right: No supraclavicular adenopathy present.       Left: No supraclavicular adenopathy present.  Neurological: She is alert and oriented to person, place, and time.  Skin: Skin is warm, dry and intact. No rash noted.  Psychiatric: She has a normal mood and affect. Her speech is normal and behavior is normal. Judgment and thought content normal.      Assessment & Plan:  1. Acute maxillary sinusitis, recurrence not specified -Please take Tylenol-Acetaminiphen 325mg  orally every 4-6 hours for pain.  Max: 10 per day - Take the Prednisone as  prescribed.  Patient states she tolerates 10mg  or less.  If higher doses used, then patient gets anxious. -To open up eustachian tubes, pinch your nose while drinking fluids. -Try steam showers to open your nasal passages.  Drink lots of water to stay hydrated and to thin mucous. - You can try the Neti Pot. -Continue Flonase:Take 2 sprays in each nostril at bedtime.  Make sure you spray towards the outside of each nostril, hold nose close and tilt head back.  This will help the medication get into your sinuses.  If you do not like this medication, then use saline nasal sprays same directions as above for Flonase. - predniSONE (DELTASONE) 5 MG tablet; Take 3 tablets PO QDaily for 3 days, then take 2 tablets PO QDaily for 3 days, then 1 tablet PO QDaily for 3 days  Dispense: 18 tablet; Refill: 0 - promethazine-dextromethorphan (PROMETHAZINE-DM) 6.25-15 MG/5ML syrup; Take 5 mLs by mouth 4 (four) times daily as needed for cough.  Dispense: 180 mL; Refill: 0 Discussed medication effects and SE's.  Patient agreed to treatment plan.  -It can take up to 2 weeks to feel better.  Sinusitis is mostly caused by viruses.  -If you do not get better in 7-10 days (Have fever, facial pain, dental pain and swelling), then please call the office and I can send in an antibiotic.  Shaquel Chavous, Stephani Police, PA-C 10:55 AM [facility]

## 2014-09-26 NOTE — Patient Instructions (Addendum)
-Please take Tylenol-Acetaminiphen 325mg  orally every 4-6 hours for pain.  Max: 10 per day - To open up eustachian tubes, pinch your nose while drinking fluids. -Try steam showers to open your nasal passages.  Drink lots of water to stay hydrated and to thin mucous. - You can try the Neti Pot Continue Flonase:Take 2 sprays in each nostril at bedtime.  Make sure you spray towards the outside of each nostril, hold nose close and tilt head back.  This will help the medication get into your sinuses.  If you do not like this medication, then use saline nasal sprays same directions as above for Flonase.  -It can take up to 2 weeks to feel better.  Sinusitis is mostly caused by viruses.  -If you do not get better in 7-10 days (Have fever, facial pain, dental pain and swelling), then please call the office and I can send in an antibiotic.  Sinusitis Sinusitis is redness, soreness, and inflammation of the paranasal sinuses. Paranasal sinuses are air pockets within the bones of your face (beneath the eyes, the middle of the forehead, or above the eyes). In healthy paranasal sinuses, mucus is able to drain out, and air is able to circulate through them by way of your nose. However, when your paranasal sinuses are inflamed, mucus and air can become trapped. This can allow bacteria and other germs to grow and cause infection. Sinusitis can develop quickly and last only a short time (acute) or continue over a long period (chronic). Sinusitis that lasts for more than 12 weeks is considered chronic.  CAUSES  Causes of sinusitis include:  Allergies.  Structural abnormalities, such as displacement of the cartilage that separates your nostrils (deviated septum), which can decrease the air flow through your nose and sinuses and affect sinus drainage.  Functional abnormalities, such as when the small hairs (cilia) that line your sinuses and help remove mucus do not work properly or are not present. SIGNS AND  SYMPTOMS  Symptoms of acute and chronic sinusitis are the same. The primary symptoms are pain and pressure around the affected sinuses. Other symptoms include:  Upper toothache.  Earache.  Headache.  Bad breath.  Decreased sense of smell and taste.  A cough, which worsens when you are lying flat.  Fatigue.  Fever.  Thick drainage from your nose, which often is green and may contain pus (purulent).  Swelling and warmth over the affected sinuses. DIAGNOSIS  Your health care provider will perform a physical exam. During the exam, your health care provider may:  Look in your nose for signs of abnormal growths in your nostrils (nasal polyps).  Tap over the affected sinus to check for signs of infection.  View the inside of your sinuses (endoscopy) using an imaging device that has a light attached (endoscope). If your health care provider suspects that you have chronic sinusitis, one or more of the following tests may be recommended:  Allergy tests.  Nasal culture. A sample of mucus is taken from your nose, sent to a lab, and screened for bacteria.  Nasal cytology. A sample of mucus is taken from your nose and examined by your health care provider to determine if your sinusitis is related to an allergy. TREATMENT  Most cases of acute sinusitis are related to a viral infection and will resolve on their own within 10 days. Sometimes medicines are prescribed to help relieve symptoms (pain medicine, decongestants, nasal steroid sprays, or saline sprays).  However, for sinusitis related to a  bacterial infection, your health care provider will prescribe antibiotic medicines. These are medicines that will help kill the bacteria causing the infection.  Rarely, sinusitis is caused by a fungal infection. In theses cases, your health care provider will prescribe antifungal medicine. For some cases of chronic sinusitis, surgery is needed. Generally, these are cases in which sinusitis recurs  more than 3 times per year, despite other treatments. HOME CARE INSTRUCTIONS   Drink plenty of water. Water helps thin the mucus so your sinuses can drain more easily.  Use a humidifier.  Inhale steam 3 to 4 times a day (for example, sit in the bathroom with the shower running).  Apply a warm, moist washcloth to your face 3 to 4 times a day, or as directed by your health care provider.  Use saline nasal sprays to help moisten and clean your sinuses.  Take medicines only as directed by your health care provider.  If you were prescribed either an antibiotic or antifungal medicine, finish it all even if you start to feel better. SEEK IMMEDIATE MEDICAL CARE IF:  You have increasing pain or severe headaches.  You have nausea, vomiting, or drowsiness.  You have swelling around your face.  You have vision problems.  You have a stiff neck.  You have difficulty breathing. MAKE SURE YOU:   Understand these instructions.  Will watch your condition.  Will get help right away if you are not doing well or get worse. Document Released: 11/15/2005 Document Revised: 04/01/2014 Document Reviewed: 11/30/2011 [facility] Patient Information 2015 [place], [place]. This information is not intended to replace advice given to you by your health care provider. Make sure you discuss any questions you have with your health care provider.

## 2014-10-04 ENCOUNTER — Ambulatory Visit
Admission: RE | Admit: 2014-10-04 | Discharge: 2014-10-04 | Disposition: A | Discharge status: Home / Self Care | Payer: BC Managed Care – PPO | Source: Ambulatory Visit

## 2014-10-04 DIAGNOSIS — Z1231 Encounter for screening mammogram for malignant neoplasm of breast: Secondary | ICD-10-CM

## 2014-10-22 ENCOUNTER — Ambulatory Visit: Payer: Self-pay | Admitting: Internal Medicine

## 2014-10-28 ENCOUNTER — Other Ambulatory Visit: Payer: Self-pay | Admitting: Physician Assistant

## 2014-10-28 ENCOUNTER — Other Ambulatory Visit: Payer: Self-pay | Admitting: *Deleted

## 2014-10-28 MED ORDER — ENALAPRIL MALEATE 20 MG PO TABS
20.0000 mg | ORAL_TABLET | Freq: Every day | ORAL | Status: DC
Start: 2014-10-28 — End: 2015-07-03

## 2014-10-28 MED ORDER — ZOLMITRIPTAN 5 MG NA SOLN: 1.0000 | NASAL | Status: DC | PRN

## 2014-11-06 ENCOUNTER — Other Ambulatory Visit: Payer: Self-pay

## 2014-11-06 MED ORDER — GABAPENTIN 100 MG PO CAPS: 200.0000 mg | ORAL_CAPSULE | Freq: Two times a day (BID) | ORAL | Status: DC

## 2014-11-13 ENCOUNTER — Ambulatory Visit: Payer: Self-pay | Admitting: Internal Medicine

## 2014-11-14 ENCOUNTER — Other Ambulatory Visit: Payer: Self-pay

## 2014-11-14 MED ORDER — GABAPENTIN 100 MG PO CAPS: 200.0000 mg | ORAL_CAPSULE | Freq: Two times a day (BID) | ORAL | Status: DC

## 2014-11-25 ENCOUNTER — Other Ambulatory Visit: Payer: Self-pay | Admitting: *Deleted

## 2014-11-25 MED ORDER — ZOLMITRIPTAN 5 MG NA SOLN: 1.0000 | NASAL | Status: DC | PRN

## 2014-12-12 ENCOUNTER — Ambulatory Visit: Payer: Self-pay | Admitting: Internal Medicine

## 2014-12-12 ENCOUNTER — Ambulatory Visit (INDEPENDENT_AMBULATORY_CARE_PROVIDER_SITE_OTHER): Payer: BLUE CROSS/BLUE SHIELD | Admitting: Physician Assistant

## 2014-12-12 ENCOUNTER — Encounter: Payer: Self-pay | Admitting: Physician Assistant

## 2014-12-12 VITALS — BP 128/76 | HR 82 | Temp 98.0°F | Resp 18 | Ht 62.25 in | Wt 116.0 lb

## 2014-12-12 DIAGNOSIS — E039 Hypothyroidism, unspecified: Secondary | ICD-10-CM

## 2014-12-12 DIAGNOSIS — I10 Essential (primary) hypertension: Secondary | ICD-10-CM

## 2014-12-12 DIAGNOSIS — R110 Nausea: Secondary | ICD-10-CM

## 2014-12-12 DIAGNOSIS — R7309 Other abnormal glucose: Secondary | ICD-10-CM | POA: Insufficient documentation

## 2014-12-12 DIAGNOSIS — E559 Vitamin D deficiency, unspecified: Secondary | ICD-10-CM

## 2014-12-12 DIAGNOSIS — R7303 Prediabetes: Secondary | ICD-10-CM

## 2014-12-12 DIAGNOSIS — Z79899 Other long term (current) drug therapy: Secondary | ICD-10-CM

## 2014-12-12 DIAGNOSIS — E538 Deficiency of other specified B group vitamins: Secondary | ICD-10-CM

## 2014-12-12 DIAGNOSIS — E785 Hyperlipidemia, unspecified: Secondary | ICD-10-CM

## 2014-12-12 DIAGNOSIS — T3695XA Adverse effect of unspecified systemic antibiotic, initial encounter: Secondary | ICD-10-CM

## 2014-12-12 DIAGNOSIS — B379 Candidiasis, unspecified: Secondary | ICD-10-CM

## 2014-12-12 DIAGNOSIS — J0100 Acute maxillary sinusitis, unspecified: Secondary | ICD-10-CM

## 2014-12-12 LAB — CBC WITH DIFFERENTIAL/PLATELET
Basophils Absolute: 0 10*3/uL (ref 0.0–0.1)
Basophils Relative: 0 % (ref 0–1)
Eosinophils Absolute: 0.1 10*3/uL (ref 0.0–0.7)
Eosinophils Relative: 2 % (ref 0–5)
HCT: 36.3 % (ref 36.0–46.0)
Hemoglobin: 11.8 g/dL — ABNORMAL LOW (ref 12.0–15.0)
Lymphocytes Relative: 26 % (ref 12–46)
Lymphs Abs: 1.6 10*3/uL (ref 0.7–4.0)
MCH: 28 pg (ref 26.0–34.0)
MCHC: 32.5 g/dL (ref 30.0–36.0)
MCV: 86 fL (ref 78.0–100.0)
MPV: 9.5 fL (ref 8.6–12.4)
Monocytes Absolute: 0.5 10*3/uL (ref 0.1–1.0)
Monocytes Relative: 8 % (ref 3–12)
Neutro Abs: 3.8 10*3/uL (ref 1.7–7.7)
Neutrophils Relative %: 64 % (ref 43–77)
Platelets: 315 10*3/uL (ref 150–400)
RBC: 4.22 MIL/uL (ref 3.87–5.11)
RDW: 14.2 % (ref 11.5–15.5)
WBC: 6 10*3/uL (ref 4.0–10.5)

## 2014-12-12 MED ORDER — AZITHROMYCIN 250 MG PO TABS: ORAL_TABLET | ORAL | Status: AC

## 2014-12-12 MED ORDER — PROMETHAZINE HCL 25 MG PO TABS: 25.0000 mg | ORAL_TABLET | Freq: Four times a day (QID) | ORAL | Status: DC | PRN

## 2014-12-12 MED ORDER — PROMETHAZINE-CODEINE 6.25-10 MG/5ML PO SYRP: 5.0000 mL | ORAL_SOLUTION | Freq: Four times a day (QID) | ORAL | Status: DC | PRN

## 2014-12-12 MED ORDER — FLUCONAZOLE 150 MG PO TABS: 150.0000 mg | ORAL_TABLET | Freq: Once | ORAL | Status: DC

## 2014-12-12 NOTE — Patient Instructions (Addendum)
-continue medications as prescribed. -Take Z-Pak as prescribed -Please let me know how much prednisone you have left. -Take promethazine-Codeine as prescribed for cough. Do NOT take promethazine tablets while on cough syrup.  Sinusitis Sinusitis is redness, soreness, and inflammation of the paranasal sinuses. Paranasal sinuses are air pockets within the bones of your face (beneath the eyes, the middle of the forehead, or above the eyes). In healthy paranasal sinuses, mucus is able to drain out, and air is able to circulate through them by way of your nose. However, when your paranasal sinuses are inflamed, mucus and air can become trapped. This can allow bacteria and other germs to grow and cause infection. Sinusitis can develop quickly and last only a short time (acute) or continue over a long period (chronic). Sinusitis that lasts for more than 12 weeks is considered chronic.  CAUSES  Causes of sinusitis include:  Allergies.  Structural abnormalities, such as displacement of the cartilage that separates your nostrils (deviated septum), which can decrease the air flow through your nose and sinuses and affect sinus drainage.  Functional abnormalities, such as when the small hairs (cilia) that line your sinuses and help remove mucus do not work properly or are not present. SIGNS AND SYMPTOMS  Symptoms of acute and chronic sinusitis are the same. The primary symptoms are pain and pressure around the affected sinuses. Other symptoms include:  Upper toothache.  Earache.  Headache.  Bad breath.  Decreased sense of smell and taste.  A cough, which worsens when you are lying flat.  Fatigue.  Fever.  Thick drainage from your nose, which often is green and may contain pus (purulent).  Swelling and warmth over the affected sinuses. DIAGNOSIS  Your health care provider will perform a physical exam. During the exam, your health care provider may:  Look in your nose for signs of abnormal  growths in your nostrils (nasal polyps).  Tap over the affected sinus to check for signs of infection.  View the inside of your sinuses (endoscopy) using an imaging device that has a light attached (endoscope). If your health care provider suspects that you have chronic sinusitis, one or more of the following tests may be recommended:  Allergy tests.  Nasal culture. A sample of mucus is taken from your nose, sent to a lab, and screened for bacteria.  Nasal cytology. A sample of mucus is taken from your nose and examined by your health care provider to determine if your sinusitis is related to an allergy. TREATMENT  Most cases of acute sinusitis are related to a viral infection and will resolve on their own within 10 days. Sometimes medicines are prescribed to help relieve symptoms (pain medicine, decongestants, nasal steroid sprays, or saline sprays).  However, for sinusitis related to a bacterial infection, your health care provider will prescribe antibiotic medicines. These are medicines that will help kill the bacteria causing the infection.  Rarely, sinusitis is caused by a fungal infection. In theses cases, your health care provider will prescribe antifungal medicine. For some cases of chronic sinusitis, surgery is needed. Generally, these are cases in which sinusitis recurs more than 3 times per year, despite other treatments. HOME CARE INSTRUCTIONS   Drink plenty of water. Water helps thin the mucus so your sinuses can drain more easily.  Use a humidifier.  Inhale steam 3 to 4 times a day (for example, sit in the bathroom with the shower running).  Apply a warm, moist washcloth to your face 3 to 4 times  a day, or as directed by your health care provider.  Use saline nasal sprays to help moisten and clean your sinuses.  Take medicines only as directed by your health care provider.  If you were prescribed either an antibiotic or antifungal medicine, finish it all even if you start  to feel better. SEEK IMMEDIATE MEDICAL CARE IF:  You have increasing pain or severe headaches.  You have nausea, vomiting, or drowsiness.  You have swelling around your face.  You have vision problems.  You have a stiff neck.  You have difficulty breathing. MAKE SURE YOU:   Understand these instructions.  Will watch your condition.  Will get help right away if you are not doing well or get worse. Document Released: 11/15/2005 Document Revised: 04/01/2014 Document Reviewed: 11/30/2011 [facility] Patient Information 2015 [place], [place]. This information is not intended to replace advice given to you by your health care provider. Make sure you discuss any questions you have with your health care provider.   Please keep you physical appt in May 2016     Bad carbs also include fruit juice, alcohol, and sweet tea. These are empty calories that do not signal to your brain that you are full.   Please remember the good carbs are still carbs which convert into sugar. So please measure them out no more than 1/2-1 cup of rice, oatmeal, pasta, and beans  Veggies are however free foods! Pile them on.   Not all fruit is created equal. Please see the list below, the fruit at the bottom is higher in sugars than the fruit at the top. Please avoid all dried fruits.

## 2014-12-12 NOTE — Progress Notes (Signed)
Assessment and Plan:  1. Essential hypertension Continue Enalapril as prescribed.  Monitor blood pressure at home.  Reminder to go to the ER if any CP, SOB, nausea, dizziness, severe HA, changes vision/speech, left arm numbness and tingling, and jaw pain. - CBC with Differential - BASIC METABOLIC PANEL WITH GFR - Hepatic function panel  2. Hyperlipidemia Continue Lipitor and Cinnamon as prescribed.  Please follow recommended diet and exercise.  Check cholesterol. - Lipid panel  3. Prediabetes Continue Cinnamon as prescribed.  Please follow recommended diet and exercise. Check A1C and Insulin levels. - Hemoglobin A1c - Insulin, fasting  4. Hypothyroidism, unspecified hypothyroidism type Check TSH level, continue Levothyroxine 41mcg (Only takes 1/2 tablet), reminded to take on an empty stomach 30-53mins before food.  - TSH  5. Vitamin D deficiency Continue vitamin D as prescribed.  Check vitamin D level. - Vit D  25 hydroxy (rtn osteoporosis monitoring)  6. Encounter for long-term (current) use of medications Will monitor kidney and liver function. - CBC with Differential - BASIC METABOLIC PANEL WITH GFR - Hepatic function panel - Magnesium  7. Vitamin B12 deficiency Continue Vitamin B12 as prescribed.  Check vitamin B12 level. - Vitamin B12  8. Acute maxillary sinusitis, recurrence not specified - azithromycin (ZITHROMAX) 250 MG tablet; Take 2 tablets PO on day 1, then 1 tablet PO Q24H x 4 days  Dispense: 6 tablet; Refill: 0 -For cough and pain with cough- promethazine-codeine (PHENERGAN WITH CODEINE) 6.25-10 MG/5ML syrup; Take 5 mLs by mouth every 6 (six) hours as needed for cough. Max: 69mL per day  Dispense: 240 mL; Refill: 0  9. Antibiotic-induced yeast infection -Patient gets yeast infections from antibiotics- fluconazole (DIFLUCAN) 150 MG tablet; Take 1 tablet (150 mg total) by mouth once.  Dispense: 1 tablet; Refill: 1  10. Nausea -Refilled medication.  Told patient  to not take while taking cough syrup- promethazine (PHENERGAN) 25 MG tablet; Take 1 tablet (25 mg total) by mouth every 6 (six) hours as needed for nausea or vomiting.  Dispense: 30 tablet; Refill: 2  Continue diet and meds as discussed. Further disposition pending results of labs. Discussed medication effects and SE's.  Pt agreed to treatment plan. Please keep your physical appt on 04/21/15.  HPI A Caucasian 59 y.o. female  presents for 3 month follow up with hypertension, hyperlipidemia, prediabetes and vitamin D.  Last visit was 07/22/14.  She currently has sinus pressure and congestion that started about 1 week ago.  She states she has thick tan mucus.  The sxs are getting worse.  See ROS for sxs.  Her blood pressure has been controlled at home, today their BP is BP: 128/76 mmHg. Patient takes Enalapril 20mg  (1/2 tablet daily) and HTN predates since 2007.  She does workout usually, but recently has not due to mom being in hospital, but she does walk her dog for 1/2 mile every day. She denies chest pain, shortness of breath, dizziness.   She is on cholesterol medication (Lipitor and Cinnamon) and denies myalgias. Her cholesterol is not at goal. The cholesterol last visit was:   Lab Results  Component Value Date   CHOL 198 07/22/2014   HDL 43 07/22/2014   LDLCALC 118* 07/22/2014   TRIG 183* 07/22/2014   CHOLHDL 4.6 07/22/2014   She has been working on diet and exercise for prediabetes, and denies increased appetite, polydipsia and polyuria. Patient has had prediabetes since 2009.  Last A1C in the office was:  Lab Results  Component Value Date  HGBA1C 6.0* 07/22/2014   Patient is on Vitamin D supplement.- Units unknown per patient  Lab Results  Component Value Date   VD25OH 63 07/22/2014     Hypothyroidism- Patient is currently taking Levothyroxine 29mcg (1/2 tablet daily).  She does take medication in the morning and by itself.     Current Medications:  Current Outpatient  Prescriptions on File Prior to Visit  Medication Sig Dispense Refill  . ADVAIR HFA 115-21 MCG/ACT inhaler     . atorvastatin (LIPITOR) 10 MG tablet take 1 tablet by mouth once daily 30 tablet 5  . Cholecalciferol (VITAMIN D PO) Take 1 tablet by mouth daily.    . clonazePAM (KLONOPIN) 0.5 MG tablet Take 0.5 mg by mouth at bedtime as needed. For sleep.    . Cyanocobalamin (VITAMIN B-12) 2500 MCG SUBL Place under the tongue.    . DULoxetine (CYMBALTA) 60 MG capsule Take 90 mg by mouth daily.     . enalapril (VASOTEC) 20 MG tablet Take 1 tablet (20 mg total) by mouth daily. 30 tablet 3  . fexofenadine-pseudoephedrine (ALLEGRA-D 12 HOUR) 60-120 MG per tablet Take 1 tablet by mouth daily.    . fluticasone (FLONASE) 50 MCG/ACT nasal spray Place 2 sprays into the nose every evening.    . gabapentin (NEURONTIN) 100 MG capsule Take 2 capsules (200 mg total) by mouth 2 (two) times daily. For pain 90 capsule 0  . levothyroxine (SYNTHROID, LEVOTHROID) 50 MCG tablet Take 1 tablet (50 mcg total) by mouth daily. 90 tablet 0  . Magnesium 250 MG TABS Take by mouth daily.    . Meloxicam (MOBIC PO) Take 15 mg by mouth daily.     . montelukast (SINGULAIR) 10 MG tablet     . pantoprazole (PROTONIX) 40 MG tablet Take 40 mg by mouth 2 (two) times daily.     Marland Kitchen zolmitriptan (ZOMIG) 5 MG nasal solution Place 1 spray into the nose as needed for migraine. 2 Units 1   No current facility-administered medications on file prior to visit.   Medical History:  Past Medical History  Diagnosis Date  . History of migraine   . HSV (herpes simplex virus) infection   . Hyperlipidemia     takes Atorvastatin daily  . Hypertension     takes Benicar daily  . Heart murmur   . MVP (mitral valve prolapse)   . Asthma     Dulera daily  . Seasonal allergies     takes Allegra daily  . Shortness of breath     with exertion  . Pneumonia     hx of--been many yrs ago  . Chronic back pain     HNP  . GERD (gastroesophageal reflux  disease)     takes Protonix daily   . Hypothyroidism     takes Synthroid daily  . Depression     takes Clonazepam nightly  . Complication of anesthesia     slow to wake up   Allergies:  Allergies  Allergen Reactions  . Demerol [Meperidine] Other (See Comments)    REACTION: unknown--itching  . Epinephrine Other (See Comments)    "Sensitivity."  . Niacin And Related Rash    ROS- Review of Systems  Constitutional: Negative.  Negative for fever, chills, malaise/fatigue and diaphoresis.  HENT: Positive for congestion and ear pain. Negative for ear discharge and sore throat.        Sinus pressure, rhinorrhea and post-nasal drip  Eyes: Negative.   Respiratory: Positive for cough. Negative  for sputum production, shortness of breath and wheezing.   Cardiovascular: Negative.  Negative for leg swelling.  Gastrointestinal: Negative.  Negative for nausea, vomiting, abdominal pain, diarrhea and constipation.  Genitourinary: Negative.  Negative for dysuria, urgency and frequency.  Musculoskeletal: Negative.   Skin: Negative.   Neurological: Positive for headaches. Negative for dizziness and tingling.  Psychiatric/Behavioral: Negative.  Negative for depression. The patient is not nervous/anxious.    Family history- Review and unchanged Social history- Review and unchanged  Physical Exam: BP 128/76 mmHg  Pulse 82  Temp(Src) 98 F (36.7 C) (Temporal)  Resp 18  Ht 5' 2.25" (1.581 m)  Wt 116 lb (52.617 kg)  BMI 21.05 kg/m2  SpO2 98% Wt Readings from Last 3 Encounters:  12/12/14 116 lb (52.617 kg)  09/26/14 115 lb (52.164 kg)  07/22/14 112 lb (50.803 kg)  Vitals Reviewed General Appearance: Well nourished, in no apparent distress. Eyes: PERRLA, EOMIs, conjunctiva no swelling or erythema.  No scleral icterus. Sinuses: Frontal and maxillary tenderness upon palpation.  ENT/Mouth: External auditory canals clear, TMs without erythema, edema or bulging. Turbinates erythematous and edematous  bilaterally.  Erythema and no edema, or exudate on posterior pharynx. Hearing normal.  Neck: Supple, no carotid bruits, thyroid normal.  Respiratory: Respiratory effort normal, CTAB.  No w/r/r or stridor.  Cardio: RRR.  m/r/g. S1S2nl. Brisk peripheral pulses without edema.  Abdomen: Soft, + normal BS.  Non tender, no guarding, rebound, hernias, masses. Lymphatics: Non tender without lymphadenopathy.  Musculoskeletal: Full ROM, 5/5 strength, normal gait.  Skin: Warm, dry intact without rashes, lesions, ecchymosis.  Neuro: Cranial nerves intact. No cerebellar symptoms. Sensation intact. Decreased DTR in knee and achilles bilaterally.  Normal DTR for bicep and brachioradialis bilaterally. Psych: Awake and oriented X 3, normal affect, Insight and Judgment appropriate.    Rafik Koppel, Stephani Police, PA-C 11:17 AM [facility]

## 2014-12-13 LAB — BASIC METABOLIC PANEL WITH GFR
BUN: 13 mg/dL (ref 6–23)
CO2: 26 mEq/L (ref 19–32)
Calcium: 9.1 mg/dL (ref 8.4–10.5)
Chloride: 103 mEq/L (ref 96–112)
Creat: 0.75 mg/dL (ref 0.50–1.10)
GFR, Est African American: >89 mL/min
GFR, Est Non African American: 89 mL/min
Glucose, Bld: 103 mg/dL — ABNORMAL HIGH (ref 70–99)
Potassium: 4 mEq/L (ref 3.5–5.3)
Sodium: 139 mEq/L (ref 135–145)

## 2014-12-13 LAB — HEPATIC FUNCTION PANEL
ALT: 11 U/L (ref 0–35)
AST: 14 U/L (ref 0–37)
Albumin: 4 g/dL (ref 3.5–5.2)
Alkaline Phosphatase: 79 U/L (ref 39–117)
Bilirubin, Direct: 0.1 mg/dL (ref 0.0–0.3)
Indirect Bilirubin: 0.5 mg/dL (ref 0.2–1.2)
Total Bilirubin: 0.6 mg/dL (ref 0.2–1.2)
Total Protein: 6.4 g/dL (ref 6.0–8.3)

## 2014-12-13 LAB — TSH: TSH: 0.577 u[IU]/mL (ref 0.350–4.500)

## 2014-12-13 LAB — HEMOGLOBIN A1C
Hgb A1c MFr Bld: 6 % — ABNORMAL HIGH (ref <5.7)
Mean Plasma Glucose: 126 mg/dL — ABNORMAL HIGH (ref <117)

## 2014-12-13 LAB — MAGNESIUM: Magnesium: 1.6 mg/dL (ref 1.5–2.5)

## 2014-12-13 LAB — LIPID PANEL
Cholesterol: 183 mg/dL (ref 0–200)
HDL: 35 mg/dL — ABNORMAL LOW (ref >39)
LDL Cholesterol: 109 mg/dL — ABNORMAL HIGH (ref 0–99)
Total CHOL/HDL Ratio: 5.2 Ratio
Triglycerides: 193 mg/dL — ABNORMAL HIGH (ref <150)
VLDL: 39 mg/dL (ref 0–40)

## 2014-12-13 LAB — VITAMIN B12: Vitamin B-12: 1604 pg/mL — ABNORMAL HIGH (ref 211–911)

## 2014-12-13 LAB — INSULIN, FASTING: Insulin fasting, serum: 19.6 u[IU]/mL (ref 2.0–19.6)

## 2014-12-13 LAB — VITAMIN D 25 HYDROXY (VIT D DEFICIENCY, FRACTURES): Vit D, 25-Hydroxy: 54 ng/mL (ref 30–100)

## 2014-12-24 ENCOUNTER — Other Ambulatory Visit: Payer: Self-pay | Admitting: *Deleted

## 2014-12-25 ENCOUNTER — Other Ambulatory Visit: Payer: Self-pay | Admitting: *Deleted

## 2014-12-25 MED ORDER — ELETRIPTAN HYDROBROMIDE 40 MG PO TABS: 40.0000 mg | ORAL_TABLET | ORAL | Status: DC | PRN

## 2015-03-12 ENCOUNTER — Ambulatory Visit (INDEPENDENT_AMBULATORY_CARE_PROVIDER_SITE_OTHER): Payer: BLUE CROSS/BLUE SHIELD

## 2015-03-12 ENCOUNTER — Ambulatory Visit (INDEPENDENT_AMBULATORY_CARE_PROVIDER_SITE_OTHER): Payer: BLUE CROSS/BLUE SHIELD | Admitting: Emergency Medicine

## 2015-03-12 VITALS — BP 134/70 | HR 88 | Temp 98.5°F | Resp 18 | Ht 62.25 in | Wt 118.4 lb

## 2015-03-12 DIAGNOSIS — M5441 Lumbago with sciatica, right side: Secondary | ICD-10-CM

## 2015-03-12 DIAGNOSIS — M5442 Lumbago with sciatica, left side: Secondary | ICD-10-CM

## 2015-03-12 MED ORDER — HYDROCODONE-ACETAMINOPHEN 5-325 MG PO TABS: 1.0000 | ORAL_TABLET | ORAL | Status: DC | PRN

## 2015-03-12 NOTE — Patient Instructions (Signed)
Sciatica Sciatica is pain, weakness, numbness, or tingling along the path of the sciatic nerve. The nerve starts in the lower back and runs down the back of each leg. The nerve controls the muscles in the lower leg and in the back of the knee, while also providing sensation to the back of the thigh, lower leg, and the sole of your foot. Sciatica is a symptom of another medical condition. For instance, nerve damage or certain conditions, such as a herniated disk or bone spur on the spine, pinch or put pressure on the sciatic nerve. This causes the pain, weakness, or other sensations normally associated with sciatica. Generally, sciatica only affects one side of the body. CAUSES   Herniated or slipped disc.  Degenerative disk disease.  A pain disorder involving the narrow muscle in the buttocks (piriformis syndrome).  Pelvic injury or fracture.  Pregnancy.  Tumor (rare). SYMPTOMS  Symptoms can vary from mild to very severe. The symptoms usually travel from the low back to the buttocks and down the back of the leg. Symptoms can include:  Mild tingling or dull aches in the lower back, leg, or hip.  Numbness in the back of the calf or sole of the foot.  Burning sensations in the lower back, leg, or hip.  Sharp pains in the lower back, leg, or hip.  Leg weakness.  Severe back pain inhibiting movement. These symptoms may get worse with coughing, sneezing, laughing, or prolonged sitting or standing. Also, being overweight may worsen symptoms. DIAGNOSIS  Your caregiver will perform a physical exam to look for common symptoms of sciatica. He or she may ask you to do certain movements or activities that would trigger sciatic nerve pain. Other tests may be performed to find the cause of the sciatica. These may include:  Blood tests.  X-rays.  Imaging tests, such as an MRI or CT scan. TREATMENT  Treatment is directed at the cause of the sciatic pain. Sometimes, treatment is not necessary  and the pain and discomfort goes away on its own. If treatment is needed, your caregiver may suggest:  Over-the-counter medicines to relieve pain.  Prescription medicines, such as anti-inflammatory medicine, muscle relaxants, or narcotics.  Applying heat or ice to the painful area.  Steroid injections to lessen pain, irritation, and inflammation around the nerve.  Reducing activity during periods of pain.  Exercising and stretching to strengthen your abdomen and improve flexibility of your spine. Your caregiver may suggest losing weight if the extra weight makes the back pain worse.  Physical therapy.  Surgery to eliminate what is pressing or pinching the nerve, such as a bone spur or part of a herniated disk. HOME CARE INSTRUCTIONS   Only take over-the-counter or prescription medicines for pain or discomfort as directed by your caregiver.  Apply ice to the affected area for 20 minutes, 3-4 times a day for the first 48-72 hours. Then try heat in the same way.  Exercise, stretch, or perform your usual activities if these do not aggravate your pain.  Attend physical therapy sessions as directed by your caregiver.  Keep all follow-up appointments as directed by your caregiver.  Do not wear high heels or shoes that do not provide proper support.  Check your mattress to see if it is too soft. A firm mattress may lessen your pain and discomfort. SEEK IMMEDIATE MEDICAL CARE IF:   You lose control of your bowel or bladder (incontinence).  You have increasing weakness in the lower back, pelvis, buttocks,   or legs.  You have redness or swelling of your back.  You have a burning sensation when you urinate.  You have pain that gets worse when you lie down or awakens you at night.  Your pain is worse than you have experienced in the past.  Your pain is lasting longer than 4 weeks.  You are suddenly losing weight without reason. MAKE SURE YOU:  Understand these  instructions.  Will watch your condition.  Will get help right away if you are not doing well or get worse. Document Released: 11/09/2001 Document Revised: 05/16/2012 Document Reviewed: 03/26/2012 [employer] Patient Information 2015 [employer]. This information is not intended to replace advice given to you by your health care provider. Make sure you discuss any questions you have with your health care provider.  
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
## 2015-03-24 ENCOUNTER — Other Ambulatory Visit: Payer: Self-pay | Admitting: Internal Medicine

## 2015-04-21 ENCOUNTER — Encounter: Payer: Self-pay | Admitting: Internal Medicine

## 2015-04-21 ENCOUNTER — Ambulatory Visit (INDEPENDENT_AMBULATORY_CARE_PROVIDER_SITE_OTHER): Payer: Medicare Other | Admitting: Internal Medicine

## 2015-04-21 VITALS — BP 134/80 | HR 80 | Temp 98.2°F | Resp 18 | Ht 61.0 in | Wt 117.0 lb

## 2015-04-21 DIAGNOSIS — R5383 Other fatigue: Secondary | ICD-10-CM

## 2015-04-21 DIAGNOSIS — R7303 Prediabetes: Secondary | ICD-10-CM

## 2015-04-21 DIAGNOSIS — E559 Vitamin D deficiency, unspecified: Secondary | ICD-10-CM

## 2015-04-21 DIAGNOSIS — Z9181 History of falling: Secondary | ICD-10-CM

## 2015-04-21 DIAGNOSIS — K219 Gastro-esophageal reflux disease without esophagitis: Secondary | ICD-10-CM

## 2015-04-21 DIAGNOSIS — E785 Hyperlipidemia, unspecified: Secondary | ICD-10-CM

## 2015-04-21 DIAGNOSIS — E039 Hypothyroidism, unspecified: Secondary | ICD-10-CM

## 2015-04-21 DIAGNOSIS — Z1331 Encounter for screening for depression: Secondary | ICD-10-CM

## 2015-04-21 DIAGNOSIS — Z1212 Encounter for screening for malignant neoplasm of rectum: Secondary | ICD-10-CM

## 2015-04-21 DIAGNOSIS — I10 Essential (primary) hypertension: Secondary | ICD-10-CM

## 2015-04-21 DIAGNOSIS — R7309 Other abnormal glucose: Secondary | ICD-10-CM

## 2015-04-21 DIAGNOSIS — Z79899 Other long term (current) drug therapy: Secondary | ICD-10-CM

## 2015-04-21 LAB — BASIC METABOLIC PANEL WITH GFR
BUN: 17 mg/dL (ref 6–23)
CO2: 26 mEq/L (ref 19–32)
Calcium: 9.2 mg/dL (ref 8.4–10.5)
Chloride: 107 mEq/L (ref 96–112)
Creat: 0.81 mg/dL (ref 0.50–1.10)
GFR, Est African American: >89 mL/min
GFR, Est Non African American: 80 mL/min
Glucose, Bld: 90 mg/dL (ref 70–99)
Potassium: 4.7 mEq/L (ref 3.5–5.3)
Sodium: 142 mEq/L (ref 135–145)

## 2015-04-21 LAB — CBC WITH DIFFERENTIAL/PLATELET
Basophils Absolute: 0 10*3/uL (ref 0.0–0.1)
Basophils Relative: 0 % (ref 0–1)
Eosinophils Absolute: 0.2 10*3/uL (ref 0.0–0.7)
Eosinophils Relative: 3 % (ref 0–5)
HCT: 34.3 % — ABNORMAL LOW (ref 36.0–46.0)
Hemoglobin: 11.3 g/dL — ABNORMAL LOW (ref 12.0–15.0)
Lymphocytes Relative: 29 % (ref 12–46)
Lymphs Abs: 1.6 10*3/uL (ref 0.7–4.0)
MCH: 28 pg (ref 26.0–34.0)
MCHC: 32.9 g/dL (ref 30.0–36.0)
MCV: 85.1 fL (ref 78.0–100.0)
MPV: 9.3 fL (ref 8.6–12.4)
Monocytes Absolute: 0.4 10*3/uL (ref 0.1–1.0)
Monocytes Relative: 7 % (ref 3–12)
Neutro Abs: 3.3 10*3/uL (ref 1.7–7.7)
Neutrophils Relative %: 61 % (ref 43–77)
Platelets: 280 10*3/uL (ref 150–400)
RBC: 4.03 MIL/uL (ref 3.87–5.11)
RDW: 14.9 % (ref 11.5–15.5)
WBC: 5.4 10*3/uL (ref 4.0–10.5)

## 2015-04-21 LAB — LIPID PANEL
Cholesterol: 178 mg/dL (ref 0–200)
HDL: 41 mg/dL — ABNORMAL LOW (ref >=46)
LDL Cholesterol: 106 mg/dL — ABNORMAL HIGH (ref 0–99)
Total CHOL/HDL Ratio: 4.3 Ratio
Triglycerides: 154 mg/dL — ABNORMAL HIGH (ref <150)
VLDL: 31 mg/dL (ref 0–40)

## 2015-04-21 LAB — HEMOGLOBIN A1C
Hgb A1c MFr Bld: 6.3 % — ABNORMAL HIGH (ref <5.7)
Mean Plasma Glucose: 134 mg/dL — ABNORMAL HIGH (ref <117)

## 2015-04-21 LAB — IRON AND TIBC
%SAT: 20 % (ref 20–55)
Iron: 74 ug/dL (ref 42–145)
TIBC: 376 ug/dL (ref 250–470)
UIBC: 302 ug/dL (ref 125–400)

## 2015-04-21 LAB — HEPATIC FUNCTION PANEL
ALT: 15 U/L (ref 0–35)
AST: 17 U/L (ref 0–37)
Albumin: 4.3 g/dL (ref 3.5–5.2)
Alkaline Phosphatase: 69 U/L (ref 39–117)
Bilirubin, Direct: 0.1 mg/dL (ref 0.0–0.3)
Indirect Bilirubin: 0.4 mg/dL (ref 0.2–1.2)
Total Bilirubin: 0.5 mg/dL (ref 0.2–1.2)
Total Protein: 6.4 g/dL (ref 6.0–8.3)

## 2015-04-21 LAB — TSH: TSH: 0.661 u[IU]/mL (ref 0.350–4.500)

## 2015-04-21 LAB — MAGNESIUM: Magnesium: 1.8 mg/dL (ref 1.5–2.5)

## 2015-04-21 LAB — VITAMIN B12: Vitamin B-12: 852 pg/mL (ref 211–911)

## 2015-04-21 NOTE — Patient Instructions (Signed)
Recommend Adult Low dose Aspirin or baby Aspirin 81 mg daily   To reduce risk of Colon Cancer 20 %,   Skin Cancer 26 % ,   Melanoma 46%   and   Pancreatic cancer 60%  ++++++++++++++++++  Vitamin D goal is between 70-100.   Please make sure that you are taking your Vitamin D as directed.   It is very important as a natural anti-inflammatory   helping hair, skin, and nails, as well as reducing stroke and heart attack risk.   It helps your bones and helps with mood.  It also decreases numerous cancer risks so please take it as directed.   Low Vit D is associated with a 200-300% higher risk for CANCER   and 200-300% higher risk for HEART   ATTACK  &  STROKE.    .....................................Marland Kitchen  It is also associated with higher death rate at younger ages,   autoimmune diseases like Rheumatoid arthritis, Lupus, Multiple Sclerosis.     Also many other serious conditions, like depression, Alzheimer's  Dementia, infertility, muscle aches, fatigue, fibromyalgia - just to name a few.  +++++++++++++++++++   Vitamin D goal is between 70-100.   Please make sure that you are taking your Vitamin D as directed.   It is very important as a natural anti-inflammatory   helping hair, skin, and nails, as well as reducing stroke and heart attack risk.   It helps your bones and helps with mood.  It also decreases numerous cancer risks so please take it as directed.   Low Vit D is associated with a 200-300% higher risk for CANCER   and 200-300% higher risk for HEART   ATTACK  &  STROKE.    .....................................Marland Kitchen  It is also associated with higher death rate at younger ages,   autoimmune diseases like Rheumatoid arthritis, Lupus, Multiple Sclerosis.     Also many other serious conditions, like depression, Alzheimer's  Dementia, infertility, muscle aches, fatigue, fibromyalgia - just to name a few.  +++++++++++++++++++    Recommend the book "The  END of DIETING" by Dr Excell Seltzer   & the book "The END of DIABETES " by Dr Excell Seltzer  At [email or web address] - get book & Audio CD's     Being diabetic has a  300% increased risk for heart attack, stroke, cancer, and alzheimer- type vascular dementia. It is very important that you work harder with diet by avoiding all foods that are white. Avoid white rice (brown & wild rice is OK), white potatoes (sweetpotatoes in moderation is OK), White bread or wheat bread or anything made out of white flour like bagels, donuts, rolls, buns, biscuits, cakes, pastries, cookies, pizza crust, and pasta (made from white flour & egg whites) - vegetarian pasta or spinach or wheat pasta is OK. Multigrain breads like Arnold's or [employer], or multigrain sandwich thins or flatbreads.  Diet, exercise and weight loss can reverse and cure diabetes in the early stages.  Diet, exercise and weight loss is very important in the control and prevention of complications of diabetes which affects every system in your body, ie. Brain - dementia/stroke, eyes - glaucoma/blindness, heart - heart attack/heart failure, kidneys - dialysis, stomach - gastric paralysis, intestines - malabsorption, nerves - severe painful neuritis, circulation - gangrene & loss of a leg(s), and finally cancer and Alzheimers.    I recommend avoid fried & greasy foods,  sweets/candy, white rice (brown or wild rice or Quinoa is OK), white potatoes (sweet  potatoes are OK) - anything made from white flour - bagels, doughnuts, rolls, buns, biscuits,white and wheat breads, pizza crust and traditional pasta made of white flour & egg white(vegetarian pasta or spinach or wheat pasta is OK).  Multi-grain bread is OK - like multi-grain flat bread or sandwich thins. Avoid alcohol in excess. Exercise is also important.    Eat all the vegetables you want - avoid meat, especially red meat and dairy - especially cheese.  Cheese is the most concentrated form of trans-fats which is  the worst thing to clog up our arteries. Veggie cheese is OK which can be found in the fresh produce section at [facility] or [employer] or [employer]  ++++++++++++++++++++++++++  Preventive Care for Adults  A healthy lifestyle and preventive care can promote health and wellness. Preventive health guidelines for women include the following key practices.  A routine yearly physical is a good way to check with your health care provider about your health and preventive screening. It is a chance to share any concerns and updates on your health and to receive a thorough exam.  Visit your dentist for a routine exam and preventive care every 6 months. Brush your teeth twice a day and floss once a day. Good oral hygiene prevents tooth decay and gum disease.  The frequency of eye exams is based on your age, health, family medical history, use of contact lenses, and other factors. Follow your health care provider's recommendations for frequency of eye exams.  Eat a healthy diet. Foods like vegetables, fruits, whole grains, low-fat dairy products, and lean protein foods contain the nutrients you need without too many calories. Decrease your intake of foods high in solid fats, added sugars, and salt. Eat the right amount of calories for you.Get information about a proper diet from your health care provider, if necessary.  Regular physical exercise is one of the most important things you can do for your health. Most adults should get at least 150 minutes of moderate-intensity exercise (any activity that increases your heart rate and causes you to sweat) each week. In addition, most adults need muscle-strengthening exercises on 2 or more days a week.  Maintain a healthy weight. The body mass index (BMI) is a screening tool to identify possible weight problems. It provides an estimate of body fat based on height and weight. Your health care provider can find your BMI and can help you achieve or maintain a  healthy weight.For adults 20 years and older:  A BMI below 18.5 is considered underweight.  A BMI of 18.5 to 24.9 is normal.  A BMI of 25 to 29.9 is considered overweight.  A BMI of 30 and above is considered obese.  Maintain normal blood lipids and cholesterol levels by exercising and minimizing your intake of saturated fat. Eat a balanced diet with plenty of fruit and vegetables. Blood tests for lipids and cholesterol should begin at age 67 and be repeated every 5 years. If your lipid or cholesterol levels are high, you are over 50, or you are at high risk for heart disease, you may need your cholesterol levels checked more frequently.Ongoing high lipid and cholesterol levels should be treated with medicines if diet and exercise are not working.  If you smoke, find out from your health care provider how to quit. If you do not use tobacco, do not start.  Lung cancer screening is recommended for adults aged 82-80 years who are at high risk for developing lung cancer because of  a history of smoking. A yearly low-dose CT scan of the lungs is recommended for people who have at least a 30-pack-year history of smoking and are a current smoker or have quit within the past 15 years. A pack year of smoking is smoking an average of 1 pack of cigarettes a day for 1 year (for example: 1 pack a day for 30 years or 2 packs a day for 15 years). Yearly screening should continue until the smoker has stopped smoking for at least 15 years. Yearly screening should be stopped for people who develop a health problem that would prevent them from having lung cancer treatment.  High blood pressure causes heart disease and increases the risk of stroke. Your blood pressure should be checked at least every 1 to 2 years. Ongoing high blood pressure should be treated with medicines if weight loss and exercise do not work.  If you are 85-27 years old, ask your health care provider if you should take aspirin to prevent  strokes.  Diabetes screening involves taking a blood sample to check your fasting blood sugar level. This should be done once every 3 years, after age 69, if you are within normal weight and without risk factors for diabetes. Testing should be considered at a younger age or be carried out more frequently if you are overweight and have at least 1 risk factor for diabetes.  Breast cancer screening is essential preventive care for women. You should practice "breast self-awareness." This means understanding the normal appearance and feel of your breasts and may include breast self-examination. Any changes detected, no matter how small, should be reported to a health care provider. Women in their 66s and 30s should have a clinical breast exam (CBE) by a health care provider as part of a regular health exam every 1 to 3 years. After age 52, women should have a CBE every year. Starting at age 48, women should consider having a mammogram (breast X-ray test) every year. Women who have a family history of breast cancer should talk to their health care provider about genetic screening. Women at a high risk of breast cancer should talk to their health care providers about having an MRI and a mammogram every year.  Breast cancer gene (BRCA)-related cancer risk assessment is recommended for women who have family members with BRCA-related cancers. BRCA-related cancers include breast, ovarian, tubal, and peritoneal cancers. Having family members with these cancers may be associated with an increased risk for harmful changes (mutations) in the breast cancer genes BRCA1 and BRCA2. Results of the assessment will determine the need for genetic counseling and BRCA1 and BRCA2 testing.  Routine pelvic exams to screen for cancer are no longer recommended for nonpregnant women who are considered low risk for cancer of the pelvic organs (ovaries, uterus, and vagina) and who do not have symptoms. Ask your health care provider if a  screening pelvic exam is right for you.  If you have had past treatment for cervical cancer or a condition that could lead to cancer, you need Pap tests and screening for cancer for at least 20 years after your treatment. If Pap tests have been discontinued, your risk factors (such as having a new sexual partner) need to be reassessed to determine if screening should be resumed. Some women have medical problems that increase the chance of getting cervical cancer. In these cases, your health care provider may recommend more frequent screening and Pap tests.  Colorectal cancer can be detected and often  prevented. Most routine colorectal cancer screening begins at the age of 23 years and continues through age 14 years. However, your health care provider may recommend screening at an earlier age if you have risk factors for colon cancer. On a yearly basis, your health care provider may provide home test kits to check for hidden blood in the stool. Use of a small camera at the end of a tube, to directly examine the colon (sigmoidoscopy or colonoscopy), can detect the earliest forms of colorectal cancer. Talk to your health care provider about this at age 22, when routine screening begins. Direct exam of the colon should be repeated every 5-10 years through age 38 years, unless early forms of pre-cancerous polyps or small growths are found.  Hepatitis C blood testing is recommended for all people born from [birth year] through 1965 and any individual with known risks for hepatitis C.  Pra  Osteoporosis is a disease in which the bones lose minerals and strength with aging. This can result in serious bone fractures or breaks. The risk of osteoporosis can be identified using a bone density scan. Women ages 20 years and over and women at risk for fractures or osteoporosis should discuss screening with their health care providers. Ask your health care provider whether you should take a calcium supplement or vitamin D to  reduce the rate of osteoporosis.  Menopause can be associated with physical symptoms and risks. Hormone replacement therapy is available to decrease symptoms and risks. You should talk to your health care provider about whether hormone replacement therapy is right for you.  Use sunscreen. Apply sunscreen liberally and repeatedly throughout the day. You should seek shade when your shadow is shorter than you. Protect yourself by wearing long sleeves, pants, a wide-brimmed hat, and sunglasses year round, whenever you are outdoors.  Once a month, do a whole body skin exam, using a mirror to look at the skin on your back. Tell your health care provider of new moles, moles that have irregular borders, moles that are larger than a pencil eraser, or moles that have changed in shape or color.  Stay current with required vaccines (immunizations).  Influenza vaccine. All adults should be immunized every year.  Tetanus, diphtheria, and acellular pertussis (Td, Tdap) vaccine. Pregnant women should receive 1 dose of Tdap vaccine during each pregnancy. The dose should be obtained regardless of the length of time since the last dose. Immunization is preferred during the 27th-36th week of gestation. An adult who has not previously received Tdap or who does not know her vaccine status should receive 1 dose of Tdap. This initial dose should be followed by tetanus and diphtheria toxoids (Td) booster doses every 10 years. Adults with an unknown or incomplete history of completing a 3-dose immunization series with Td-containing vaccines should begin or complete a primary immunization series including a Tdap dose. Adults should receive a Td booster every 10 years.  Varicella vaccine. An adult without evidence of immunity to varicella should receive 2 doses or a second dose if she has previously received 1 dose. Pregnant females who do not have evidence of immunity should receive the first dose after pregnancy. This first  dose should be obtained before leaving the health care facility. The second dose should be obtained 4-8 weeks after the first dose.  Human papillomavirus (HPV) vaccine. Females aged 13-26 years who have not received the vaccine previously should obtain the 3-dose series. The vaccine is not recommended for use in pregnant females. However, pregnancy  testing is not needed before receiving a dose. If a female is found to be pregnant after receiving a dose, no treatment is needed. In that case, the remaining doses should be delayed until after the pregnancy. Immunization is recommended for any person with an immunocompromised condition through the age of 31 years if she did not get any or all doses earlier. During the 3-dose series, the second dose should be obtained 4-8 weeks after the first dose. The third dose should be obtained 24 weeks after the first dose and 16 weeks after the second dose.  Zoster vaccine. One dose is recommended for adults aged 68 years or older unless certain conditions are present.  Measles, mumps, and rubella (MMR) vaccine. Adults born before [birth year] generally are considered immune to measles and mumps. Adults born in [birth year] or later should have 1 or more doses of MMR vaccine unless there is a contraindication to the vaccine or there is laboratory evidence of immunity to each of the three diseases. A routine second dose of MMR vaccine should be obtained at least 28 days after the first dose for students attending postsecondary schools, health care workers, or international travelers. People who received inactivated measles vaccine or an unknown type of measles vaccine during 1963-1967 should receive 2 doses of MMR vaccine. People who received inactivated mumps vaccine or an unknown type of mumps vaccine before 1979 and are at high risk for mumps infection should consider immunization with 2 doses of MMR vaccine. For females of childbearing age, rubella immunity should be determined. If there  is no evidence of immunity, females who are not pregnant should be vaccinated. If there is no evidence of immunity, females who are pregnant should delay immunization until after pregnancy. Unvaccinated health care workers born before [birth year] who lack laboratory evidence of measles, mumps, or rubella immunity or laboratory confirmation of disease should consider measles and mumps immunization with 2 doses of MMR vaccine or rubella immunization with 1 dose of MMR vaccine.  Pneumococcal 13-valent conjugate (PCV13) vaccine. When indicated, a person who is uncertain of her immunization history and has no record of immunization should receive the PCV13 vaccine. An adult aged 42 years or older who has certain medical conditions and has not been previously immunized should receive 1 dose of PCV13 vaccine. This PCV13 should be followed with a dose of pneumococcal polysaccharide (PPSV23) vaccine. The PPSV23 vaccine dose should be obtained at least 8 weeks after the dose of PCV13 vaccine. An adult aged 71 years or older who has certain medical conditions and previously received 1 or more doses of PPSV23 vaccine should receive 1 dose of PCV13. The PCV13 vaccine dose should be obtained 1 or more years after the last PPSV23 vaccine dose.    Pneumococcal polysaccharide (PPSV23) vaccine. When PCV13 is also indicated, PCV13 should be obtained first. All adults aged 37 years and older should be immunized. An adult younger than age 82 years who has certain medical conditions should be immunized. Any person who resides in a nursing home or long-term care facility should be immunized. An adult smoker should be immunized. People with an immunocompromised condition and certain other conditions should receive both PCV13 and PPSV23 vaccines. People with human immunodeficiency virus (HIV) infection should be immunized as soon as possible after diagnosis. Immunization during chemotherapy or radiation therapy should be avoided. Routine use  of PPSV23 vaccine is not recommended for American Indians, [place] Natives, or people younger than 65 years unless there are medical conditions that require  PPSV23 vaccine. When indicated, people who have unknown immunization and have no record of immunization should receive PPSV23 vaccine. One-time revaccination 5 years after the first dose of PPSV23 is recommended for people aged 19-64 years who have chronic kidney failure, nephrotic syndrome, asplenia, or immunocompromised conditions. People who received 1-2 doses of PPSV23 before age 16 years should receive another dose of PPSV23 vaccine at age 53 years or later if at least 5 years have passed since the previous dose. Doses of PPSV23 are not needed for people immunized with PPSV23 at or after age 9 years.  Preventive Services / Frequency   Ages 28 to 90 years  Blood pressure check.  Lipid and cholesterol check.  Lung cancer screening. / Every year if you are aged 74-80 years and have a 30-pack-year history of smoking and currently smoke or have quit within the past 15 years. Yearly screening is stopped once you have quit smoking for at least 15 years or develop a health problem that would prevent you from having lung cancer treatment.  Clinical breast exam.** / Every year after age 59 years.  BRCA-related cancer risk assessment.** / For women who have family members with a BRCA-related cancer (breast, ovarian, tubal, or peritoneal cancers).  Mammogram.** / Every year beginning at age 64 years and continuing for as long as you are in good health. Consult with your health care provider.  Pap test.** / Every 3 years starting at age 41 years through age 97 or 90 years with a history of 3 consecutive normal Pap tests.  HPV screening.** / Every 3 years from ages 47 years through ages 63 to 67 years with a history of 3 consecutive normal Pap tests.  Fecal occult blood test (FOBT) of stool. / Every year beginning at age 78 years and continuing  until age 32 years. You may not need to do this test if you get a colonoscopy every 10 years.  Flexible sigmoidoscopy or colonoscopy.** / Every 5 years for a flexible sigmoidoscopy or every 10 years for a colonoscopy beginning at age 62 years and continuing until age 38 years.  Hepatitis C blood test.** / For all people born from [birth year] through 1964 and any individual with known risks for hepatitis C.  Skin self-exam. / Monthly.  Influenza vaccine. / Every year.  Tetanus, diphtheria, and acellular pertussis (Tdap/Td) vaccine.** / Consult your health care provider. Pregnant women should receive 1 dose of Tdap vaccine during each pregnancy. 1 dose of Td every 10 years.  Varicella vaccine.** / Consult your health care provider. Pregnant females who do not have evidence of immunity should receive the first dose after pregnancy.  Zoster vaccine.** / 1 dose for adults aged 30 years or older.  Pneumococcal 13-valent conjugate (PCV13) vaccine.** / Consult your health care provider.  Pneumococcal polysaccharide (PPSV23) vaccine.** / 1 to 2 doses if you smoke cigarettes or if you have certain conditions.  Meningococcal vaccine.** / Consult your health care provider.  Hepatitis A vaccine.** / Consult your health care provider.  Hepatitis B vaccine.** / Consult your health care provider. Screening for abdominal aortic aneurysm (AAA)  by ultrasound is recommended for people over 50 who have history of high blood pressure or who are current or former smokers.
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
## 2015-04-22 LAB — MICROALBUMIN / CREATININE URINE RATIO
Creatinine, Urine: 84.4 mg/dL
Microalb Creat Ratio: 4.7 mg/g (ref 0.0–30.0)
Microalb, Ur: 0.4 mg/dL (ref <2.0)

## 2015-04-22 LAB — URINALYSIS, MICROSCOPIC ONLY
Bacteria, UA: NONE SEEN
Casts: NONE SEEN
Crystals: NONE SEEN
Squamous Epithelial / LPF: NONE SEEN

## 2015-04-22 LAB — INSULIN, RANDOM: Insulin: 21.4 u[IU]/mL — ABNORMAL HIGH (ref 2.0–19.6)

## 2015-04-22 LAB — VITAMIN D 25 HYDROXY (VIT D DEFICIENCY, FRACTURES): Vit D, 25-Hydroxy: 46 ng/mL (ref 30–100)

## 2015-05-20 ENCOUNTER — Other Ambulatory Visit: Payer: Self-pay | Admitting: *Deleted

## 2015-05-20 MED ORDER — DULOXETINE HCL 30 MG PO CPEP
ORAL_CAPSULE | ORAL | Status: DC
Start: 2015-05-20 — End: 2015-12-01

## 2015-05-22 ENCOUNTER — Other Ambulatory Visit: Payer: Self-pay | Admitting: Internal Medicine

## 2015-05-22 ENCOUNTER — Other Ambulatory Visit: Payer: Self-pay | Admitting: *Deleted

## 2015-05-22 MED ORDER — FLUTICASONE PROPIONATE 50 MCG/ACT NA SUSP: 2.0000 | Freq: Every evening | NASAL | Status: DC

## 2015-05-22 MED ORDER — MONTELUKAST SODIUM 10 MG PO TABS: 10.0000 mg | ORAL_TABLET | Freq: Every day | ORAL | Status: DC

## 2015-05-22 MED ORDER — MELOXICAM 15 MG PO TABS: 15.0000 mg | ORAL_TABLET | Freq: Every day | ORAL | Status: DC

## 2015-06-04 ENCOUNTER — Other Ambulatory Visit: Payer: Self-pay | Admitting: *Deleted

## 2015-06-04 ENCOUNTER — Other Ambulatory Visit: Payer: Self-pay | Admitting: Neurological Surgery

## 2015-06-04 DIAGNOSIS — M5416 Radiculopathy, lumbar region: Secondary | ICD-10-CM

## 2015-06-04 MED ORDER — FLUTICASONE-SALMETEROL 115-21 MCG/ACT IN AERO: 2.0000 | INHALATION_SPRAY | RESPIRATORY_TRACT | Status: DC | PRN

## 2015-06-17 ENCOUNTER — Inpatient Hospital Stay: Admission: RE | Admit: 2015-06-17 | Payer: Self-pay | Source: Ambulatory Visit

## 2015-06-18 ENCOUNTER — Ambulatory Visit
Admission: RE | Admit: 2015-06-18 | Discharge: 2015-06-18 | Disposition: A | Discharge status: Home / Self Care | Payer: Medicare Other | Source: Ambulatory Visit | Attending: Neurological Surgery | Admitting: Neurological Surgery

## 2015-06-18 DIAGNOSIS — M5416 Radiculopathy, lumbar region: Secondary | ICD-10-CM

## 2015-06-18 MED ORDER — GADOBENATE DIMEGLUMINE 529 MG/ML IV SOLN
10.0000 mL | Freq: Once | INTRAVENOUS | Status: AC | PRN
  Administered 2015-06-18: 10 mL via INTRAVENOUS

## 2015-07-03 ENCOUNTER — Other Ambulatory Visit: Payer: Self-pay | Admitting: Internal Medicine

## 2015-07-08 ENCOUNTER — Other Ambulatory Visit: Payer: Self-pay | Admitting: Internal Medicine

## 2015-07-29 ENCOUNTER — Ambulatory Visit (INDEPENDENT_AMBULATORY_CARE_PROVIDER_SITE_OTHER): Payer: Medicare Other | Admitting: Physician Assistant

## 2015-07-29 ENCOUNTER — Ambulatory Visit: Payer: Self-pay | Admitting: Internal Medicine

## 2015-07-29 ENCOUNTER — Encounter: Payer: Self-pay | Admitting: Physician Assistant

## 2015-07-29 VITALS — BP 136/78 | HR 84 | Temp 98.5°F | Resp 16 | Ht 62.25 in | Wt 116.6 lb

## 2015-07-29 DIAGNOSIS — E559 Vitamin D deficiency, unspecified: Secondary | ICD-10-CM | POA: Diagnosis not present

## 2015-07-29 DIAGNOSIS — E785 Hyperlipidemia, unspecified: Secondary | ICD-10-CM

## 2015-07-29 DIAGNOSIS — R7303 Prediabetes: Secondary | ICD-10-CM

## 2015-07-29 DIAGNOSIS — R7309 Other abnormal glucose: Secondary | ICD-10-CM | POA: Diagnosis not present

## 2015-07-29 DIAGNOSIS — Z79899 Other long term (current) drug therapy: Secondary | ICD-10-CM

## 2015-07-29 DIAGNOSIS — E039 Hypothyroidism, unspecified: Secondary | ICD-10-CM

## 2015-07-29 DIAGNOSIS — I10 Essential (primary) hypertension: Secondary | ICD-10-CM | POA: Diagnosis not present

## 2015-07-29 LAB — CBC WITH DIFFERENTIAL/PLATELET
Basophils Absolute: 0 10*3/uL (ref 0.0–0.1)
Basophils Relative: 0 % (ref 0–1)
Eosinophils Absolute: 0.4 10*3/uL (ref 0.0–0.7)
Eosinophils Relative: 5 % (ref 0–5)
HCT: 36.5 % (ref 36.0–46.0)
Hemoglobin: 11.8 g/dL — ABNORMAL LOW (ref 12.0–15.0)
Lymphocytes Relative: 28 % (ref 12–46)
Lymphs Abs: 2 10*3/uL (ref 0.7–4.0)
MCH: 28.2 pg (ref 26.0–34.0)
MCHC: 32.3 g/dL (ref 30.0–36.0)
MCV: 87.3 fL (ref 78.0–100.0)
MPV: 9.3 fL (ref 8.6–12.4)
Monocytes Absolute: 0.6 10*3/uL (ref 0.1–1.0)
Monocytes Relative: 8 % (ref 3–12)
Neutro Abs: 4.3 10*3/uL (ref 1.7–7.7)
Neutrophils Relative %: 59 % (ref 43–77)
Platelets: 310 10*3/uL (ref 150–400)
RBC: 4.18 MIL/uL (ref 3.87–5.11)
RDW: 14.6 % (ref 11.5–15.5)
WBC: 7.3 10*3/uL (ref 4.0–10.5)

## 2015-07-29 LAB — HEPATIC FUNCTION PANEL
ALT: 18 U/L (ref 6–29)
AST: 20 U/L (ref 10–35)
Albumin: 4.2 g/dL (ref 3.6–5.1)
Alkaline Phosphatase: 79 U/L (ref 33–130)
Bilirubin, Direct: 0.1 mg/dL (ref <=0.2)
Indirect Bilirubin: 0.3 mg/dL (ref 0.2–1.2)
Total Bilirubin: 0.4 mg/dL (ref 0.2–1.2)
Total Protein: 6.6 g/dL (ref 6.1–8.1)

## 2015-07-29 LAB — BASIC METABOLIC PANEL WITH GFR
BUN: 15 mg/dL (ref 7–25)
CO2: 29 mmol/L (ref 20–31)
Calcium: 9.3 mg/dL (ref 8.6–10.4)
Chloride: 104 mmol/L (ref 98–110)
Creat: 0.84 mg/dL (ref 0.50–1.05)
GFR, Est African American: 89 mL/min (ref >=60)
GFR, Est Non African American: 77 mL/min (ref >=60)
Glucose, Bld: 124 mg/dL — ABNORMAL HIGH (ref 65–99)
Potassium: 4.1 mmol/L (ref 3.5–5.3)
Sodium: 140 mmol/L (ref 135–146)

## 2015-07-29 LAB — LIPID PANEL
Cholesterol: 188 mg/dL (ref 125–200)
HDL: 38 mg/dL — ABNORMAL LOW (ref >=46)
LDL Cholesterol: 106 mg/dL (ref <130)
Total CHOL/HDL Ratio: 4.9 Ratio (ref <=5.0)
Triglycerides: 219 mg/dL — ABNORMAL HIGH (ref <150)
VLDL: 44 mg/dL — ABNORMAL HIGH (ref <30)

## 2015-07-29 LAB — MAGNESIUM: Magnesium: 2 mg/dL (ref 1.5–2.5)

## 2015-07-29 LAB — TSH: TSH: 2.038 u[IU]/mL (ref 0.350–4.500)

## 2015-07-29 LAB — HEMOGLOBIN A1C
Hgb A1c MFr Bld: 6 % — ABNORMAL HIGH (ref <5.7)
Mean Plasma Glucose: 126 mg/dL — ABNORMAL HIGH (ref <117)

## 2015-07-29 MED ORDER — PREDNISONE 20 MG PO TABS: ORAL_TABLET | ORAL | Status: DC

## 2015-07-29 MED ORDER — NYSTATIN 100000 UNIT/ML MT SUSP: 5.0000 mL | Freq: Four times a day (QID) | OROMUCOSAL | Status: DC

## 2015-07-29 NOTE — Patient Instructions (Signed)
You can take tylenol (500mg ) or tylenol arthritis (650mg ) with the meloxicam/antiinflammatories. The max you can take of tylenol a day is 3000mg  daily, this is a max of 6 pills a day of the regular tyelnol (500mg ) or a max of 4 a day of the tylenol arthritis (650mg ) as long as no other medications you are taking contain tylenol.   Use vinegar with water to swish/swallow after inhaler.   Sinusitis can be uncomfortable. People with sinusitis have congestion with yellow/green/gray discharge, sinus pain/pressure, pain around the eyes. Sinus infections almost ALWAYS stem from a viral infection and antibiotics don't work against a virus. Even when bacteria is responsible, the infections usually clear up on their own in a week or so.   PLEASE TRY TO DO OVER THE COUNTER TREATMENT AND PREDNISONE FOR 5-7 DAYS AND IF YOU ARE NOT GETTING BETTER OR GETTING WORSE THEN YOU CAN START ON AN ANTIBIOTIC GIVEN.  Can take the prednisone AT NIGHT WITH DINNER, it take 8-12 hours to start working so it will NOT affect your sleeping if you take it at night with your food!! Take two pills the first night and 1 or two pill the second night and then 1 pill the other nights.   Risk of antibiotic use: About 1 in 4 people who take antibiotics have side effects including stomach problems, dizziness, or rashes. Those problems clear up soon after stopping the drugs, but in rare cases antibiotics can cause severe allergic reaction. Over use of antibiotics also encourages the growth of bacteria that can't be controlled easily with drugs. That makes you more vunerable to antibiotic-resistant infections and undermines the benefits of antibiotics for others.   Waste of Money: Antibiotics often aren't very expensive, but any money spent on unnecessary drugs is money down the drain.   When are antibiotics needed? Only when symptoms last longer than a week.  Start to improve but then worsen again  -It can take up to 2 weeks to feel  better.   -If you do not get better in 7-10 days (Have fever, facial pain, dental pain and swelling), then please call the office and it is now appropriate to start an antibiotic.   -Please take Tylenol or Ibuprofen for pain. -Acetaminiphen 325mg  orally every 4-6 hours for pain.  Max: 10 per day -Ibuprofen 200mg  orally every 6-8 hours for pain.  Take with food to avoid ulcers.   Max 10 per day  Please pick one of the over the counter allergy medications below and take it once daily for allergies.  Claritin or loratadine cheapest but likely the weakest  Zyrtec or certizine at night because it can make you sleepy The strongest is allegra or fexafinadine  Cheapest at [employer], [employer], [employer]  -While drinking fluids, pinch and hold nose close and swallow.  This will help open up your eustachian tubes to drain the fluid behind your ear drums. -Try steam showers to open your nasal passages.   Drink lots of water to stay hydrated and to thin mucous.  Flonase/Nasonex is to help the inflammation.  Take 2 sprays in each nostril at bedtime.  Make sure you spray towards the outside of each nostril towards the outer corner of your eye, hold nose close and tilt head back.  This will help the medication get into your sinuses.  If you do not like this medication, then use saline nasal sprays same directions as above for Flonase. Stop the medication right away if you get blurring of your  vision or nose bleeds.  Sinusitis Sinusitis is redness, soreness, and inflammation of the paranasal sinuses. Paranasal sinuses are air pockets within the bones of your face (beneath the eyes, the middle of the forehead, or above the eyes). In healthy paranasal sinuses, mucus is able to drain out, and air is able to circulate through them by way of your nose. However, when your paranasal sinuses are inflamed, mucus and air can become trapped. This can allow bacteria and other germs to grow and cause infection. Sinusitis can  develop quickly and last only a short time (acute) or continue over a long period (chronic). Sinusitis that lasts for more than 12 weeks is considered chronic.  CAUSES  Causes of sinusitis include: Allergies. Structural abnormalities, such as displacement of the cartilage that separates your nostrils (deviated septum), which can decrease the air flow through your nose and sinuses and affect sinus drainage. Functional abnormalities, such as when the small hairs (cilia) that line your sinuses and help remove mucus do not work properly or are not present. SIGNS AND SYMPTOMS  Symptoms of acute and chronic sinusitis are the same. The primary symptoms are pain and pressure around the affected sinuses. Other symptoms include: Upper toothache. Earache. Headache. Bad breath. Decreased sense of smell and taste. A cough, which worsens when you are lying flat. Fatigue. Fever. Thick drainage from your nose, which often is green and may contain pus (purulent). Swelling and warmth over the affected sinuses. DIAGNOSIS  Your health care provider will perform a physical exam. During the exam, your health care provider may: Look in your nose for signs of abnormal growths in your nostrils (nasal polyps).  Tap over the affected sinus to check for signs of infection. View the inside of your sinuses (endoscopy) using an imaging device that has a light attached (endoscope). If your health care provider suspects that you have chronic sinusitis, one or more of the following tests may be recommended: Allergy tests. Nasal culture. A sample of mucus is taken from your nose, sent to a lab, and screened for bacteria. Nasal cytology. A sample of mucus is taken from your nose and examined by your health care provider to determine if your sinusitis is related to an allergy. TREATMENT  Most cases of acute sinusitis are related to a viral infection and will resolve on their own within 10 days. Sometimes medicines are  prescribed to help relieve symptoms (pain medicine, decongestants, nasal steroid sprays, or saline sprays).  However, for sinusitis related to a bacterial infection, your health care provider will prescribe antibiotic medicines. These are medicines that will help kill the bacteria causing the infection.  Rarely, sinusitis is caused by a fungal infection. In theses cases, your health care provider will prescribe antifungal medicine. For some cases of chronic sinusitis, surgery is needed. Generally, these are cases in which sinusitis recurs more than 3 times per year, despite other treatments. HOME CARE INSTRUCTIONS  Drink plenty of water. Water helps thin the mucus so your sinuses can drain more easily. Use a humidifier. Inhale steam 3 to 4 times a day (for example, sit in the bathroom with the shower running). Apply a warm, moist washcloth to your face 3 to 4 times a day, or as directed by your health care provider. Use saline nasal sprays to help moisten and clean your sinuses. Take medicines only as directed by your health care provider. If you were prescribed either an antibiotic or antifungal medicine, finish it all even if you start to feel  better. SEEK IMMEDIATE MEDICAL CARE IF: You have increasing pain or severe headaches. You have nausea, vomiting, or drowsiness. You have swelling around your face. You have vision problems. You have a stiff neck. You have difficulty breathing. MAKE SURE YOU:  Understand these instructions. Will watch your condition. Will get help right away if you are not doing well or get worse. Document Released: 11/15/2005 Document Revised: 04/01/2014 Document Reviewed: 11/30/2011 [facility] Patient Information 2015 [place], [place]. This information is not intended to replace advice given to you by your health care provider. Make sure you discuss any questions you have with your health care provider.

## 2015-07-29 NOTE — Progress Notes (Signed)
Assessment and Plan:  Hypertension: Continue medication, monitor blood pressure at home. Continue DASH diet. Cholesterol: Continue diet and exercise. Check cholesterol.  Pre-diabetes-Continue diet and exercise. Check A1C Vitamin D Def- check level and continue medications.  Hypothyroidism-check TSH level, continue medications the same.  Oral thrush- nystatin, use vinegar solution after inhaler Sinus irritation- dymista samples given, use netipot, will give zpak to do as needed.   Continue diet and meds as discussed. Further disposition pending results of labs.  HPI 59 y.o. female  presents for 3 month follow up with hypertension, hyperlipidemia, prediabetes and vitamin D. Her blood pressure has been controlled at home, today their BP is BP: 136/78 mmHg She does workout. She denies chest pain, shortness of breath, dizziness.  She is on cholesterol medication and denies myalgias. Her cholesterol is at goal. The cholesterol last visit was:   Lab Results  Component Value Date   CHOL 178 04/21/2015   HDL 41* 04/21/2015   LDLCALC 106* 04/21/2015   TRIG 154* 04/21/2015   CHOLHDL 4.3 04/21/2015   She has been working on diet and exercise for prediabetes, she is on an ACE, and denies polydipsia, polyuria and visual disturbances. Last A1C in the office was:  Lab Results  Component Value Date   HGBA1C 6.3* 04/21/2015   Patient is on Vitamin D supplement.   Lab Results  Component Value Date   VD25OH 46 04/21/2015     She is on thyroid medication. Her medication was not changed last visit. Patient denies nervousness, palpitations and weight changes.  Lab Results  Component Value Date   TSH 0.661 04/21/2015  .  She is on pristiq for depression and takes klonopin at night for sleep.  She is seeing Dr. Lenna Gilford at rheumatology for possible RA versus OA, she is on mobic which she states is helping. She will follow up with her in Sept.  She states that over the weekend was cleaning out a dirty  room and that is has had worsening sinus pain/issues. She is on allegra D.   Current Medications:  Current Outpatient Prescriptions on File Prior to Visit  Medication Sig Dispense Refill  . atorvastatin (LIPITOR) 10 MG tablet take 1 tablet by mouth once daily 30 tablet 5  . Cholecalciferol (VITAMIN D PO) Take 4,000 Units by mouth daily.     Marland Kitchen CINNAMON PO Take 1,000 Units by mouth daily.    . clonazePAM (KLONOPIN) 0.5 MG tablet Take 0.5 mg by mouth at bedtime as needed. For sleep.    . Cyanocobalamin (VITAMIN B-12) 2500 MCG SUBL Place under the tongue.    . DULoxetine (CYMBALTA) 30 MG capsule Take 1 capsule tid. 90 capsule 5  . eletriptan (RELPAX) 40 MG tablet Take 1 tablet (40 mg total) by mouth as needed for migraine or headache. One tablet by mouth at onset of headache. May repeat in 2 hours if headache persists or recurs. 24 tablet 11  . enalapril (VASOTEC) 20 MG tablet TAKE 1 TABLET BY MOUTH DAILY 30 tablet 3  . fexofenadine-pseudoephedrine (ALLEGRA-D 12 HOUR) 60-120 MG per tablet Take 1 tablet by mouth daily.    . fluticasone (FLONASE) 50 MCG/ACT nasal spray SHAKE WELL AND USE 2 SPRAYS IN EACH NOSTRIL EVERY EVENING 48 g 1  . fluticasone-salmeterol (ADVAIR HFA) 115-21 MCG/ACT inhaler Inhale 2 puffs into the lungs as needed. 1 Inhaler 3  . gabapentin (NEURONTIN) 300 MG capsule Take 300 mg by mouth 2 (two) times daily.   0  . HYDROcodone-acetaminophen ([place])  5-325 MG per tablet Take 1-2 tablets by mouth every 4 (four) hours as needed. 30 tablet 0  . Magnesium 250 MG TABS Take by mouth daily.    . meloxicam (MOBIC) 15 MG tablet Take 1 tablet (15 mg total) by mouth daily. 90 tablet 1  . montelukast (SINGULAIR) 10 MG tablet Take 1 tablet (10 mg total) by mouth at bedtime. 90 tablet 1  . pantoprazole (PROTONIX) 40 MG tablet Take 40 mg by mouth 2 (two) times daily.     Marland Kitchen PROAIR HFA 108 (90 BASE) MCG/ACT inhaler Inhale 2 puffs into the lungs as needed.   0  . promethazine (PHENERGAN) 25 MG tablet  Take 1 tablet (25 mg total) by mouth every 6 (six) hours as needed for nausea or vomiting. 30 tablet 2  . promethazine-codeine (PHENERGAN WITH CODEINE) 6.25-10 MG/5ML syrup Take 5 mLs by mouth every 6 (six) hours as needed for cough. Max: 36mL per day 240 mL 0  . SYNTHROID 50 MCG tablet take 1 tablet by mouth once daily 90 tablet 0   No current facility-administered medications on file prior to visit.   Medical History:  Past Medical History  Diagnosis Date  . History of migraine   . HSV (herpes simplex virus) infection   . Hyperlipidemia     takes Atorvastatin daily  . Hypertension     takes Benicar daily  . Heart murmur   . MVP (mitral valve prolapse)   . Asthma     Dulera daily  . Seasonal allergies     takes Allegra daily  . Shortness of breath     with exertion  . Pneumonia     hx of--been many yrs ago  . Chronic back pain     HNP  . GERD (gastroesophageal reflux disease)     takes Protonix daily   . Hypothyroidism     takes Synthroid daily  . Depression     takes Clonazepam nightly  . Complication of anesthesia     slow to wake up   Allergies:  Allergies  Allergen Reactions  . Demerol [Meperidine] Other (See Comments)    REACTION: unknown--itching  . Epinephrine Other (See Comments)    "Sensitivity."  . Niacin And Related Rash    Review of Systems  Constitutional: Negative.   HENT: Positive for congestion, ear pain and hearing loss. Negative for ear discharge, nosebleeds, sore throat and tinnitus.   Respiratory: Negative.  Negative for stridor.   Cardiovascular: Negative.   Gastrointestinal: Positive for constipation. Negative for heartburn, nausea, vomiting, abdominal pain, diarrhea, blood in stool and melena.  Genitourinary: Negative.   Musculoskeletal: Positive for back pain (occ left leg pain). Negative for myalgias, joint pain, falls and neck pain.  Skin: Negative.   Neurological: Negative.  Negative for headaches.  Psychiatric/Behavioral: Negative.      Family history- Review and unchanged Social history- Review and unchanged Physical Exam: BP 136/78 mmHg  Pulse 84  Temp(Src) 98.5 F (36.9 C)  Resp 16  Ht 5' 2.25" (1.581 m)  Wt 116 lb 9.6 oz (52.889 kg)  BMI 21.16 kg/m2 Wt Readings from Last 3 Encounters:  07/29/15 116 lb 9.6 oz (52.889 kg)  04/21/15 117 lb (53.071 kg)  03/12/15 118 lb 6.4 oz (53.706 kg)   General Appearance: Well nourished, in no apparent distress. Eyes: PERRLA, EOMs, conjunctiva no swelling or erythema Sinuses: No Frontal/maxillary tenderness ENT/Mouth: Ext aud canals clear, TMs without erythema, bulging. No erythema, swelling, but has white erythematous plaques  on post pharynx.  Tonsils not swollen or erythematous. Hearing normal.  Neck: Supple, thyroid normal.  Respiratory: Respiratory effort normal, BS equal bilaterally without rales, rhonchi, wheezing or stridor.  Cardio: RRR with no MRGs. Brisk peripheral pulses without edema.  Abdomen: Soft, + BS.  Non tender, no guarding, rebound, hernias, masses. Lymphatics: Non tender without lymphadenopathy.  Musculoskeletal: Full ROM, 5/5 strength, normal gait.  Skin: Warm, dry without rashes, lesions, ecchymosis.  Neuro: Cranial nerves intact. Normal muscle tone, no cerebellar symptoms. Sensation intact.  Psych: Awake and oriented X 3, normal affect, Insight and Judgment appropriate.    Vicie Mutters 11:50 AM

## 2015-07-30 LAB — VITAMIN D 25 HYDROXY (VIT D DEFICIENCY, FRACTURES): Vit D, 25-Hydroxy: 48 ng/mL (ref 30–100)

## 2015-08-11 ENCOUNTER — Other Ambulatory Visit: Payer: Self-pay | Admitting: Internal Medicine

## 2015-09-04 ENCOUNTER — Encounter: Payer: Self-pay | Admitting: Physician Assistant

## 2015-09-04 ENCOUNTER — Ambulatory Visit (INDEPENDENT_AMBULATORY_CARE_PROVIDER_SITE_OTHER): Payer: Medicare Other | Admitting: Physician Assistant

## 2015-09-04 VITALS — BP 120/60 | HR 75 | Temp 97.5°F | Resp 16 | Ht 62.25 in | Wt 117.0 lb

## 2015-09-04 DIAGNOSIS — J0100 Acute maxillary sinusitis, unspecified: Secondary | ICD-10-CM

## 2015-09-04 DIAGNOSIS — H9202 Otalgia, left ear: Secondary | ICD-10-CM

## 2015-09-04 DIAGNOSIS — H6122 Impacted cerumen, left ear: Secondary | ICD-10-CM | POA: Diagnosis not present

## 2015-09-04 MED ORDER — AZITHROMYCIN 250 MG PO TABS: ORAL_TABLET | ORAL | Status: AC

## 2015-09-04 NOTE — Patient Instructions (Signed)
Use a dropper or use a cap to put olive oil,mineral oil or canola oil in the effected ear- 2-3 times a week. Let it soak for 20-30 min then you can take a shower or use a baby bulb with warm water to wash out the ear wax.  Do not use Qtips   

## 2015-09-04 NOTE — Progress Notes (Signed)
   Subjective:    Patient ID: Sarah Rodriguez, female    DOB: [birth year], 59 y.o.   MRN: [id number]  HPI 59 y.o. WF presents with ear pain. She has gotten new hearing aids, they said she had ear wax in her left ear, used at home debrox/water, but has been having ear fullness, feels that they need to pop but she can't, some left ear pain. Has stopped flonase because she feels that it made it worse. No fever or chills.   Blood pressure 120/60, pulse 75, temperature 97.5 F (36.4 C), temperature source Temporal, resp. rate 16, height 5' 2.25" (1.581 m), weight 117 lb (53.071 kg), SpO2 97 %.   Review of Systems  Constitutional: Negative for fever, chills and fatigue.  HENT: Positive for congestion, ear pain, hearing loss, postnasal drip, rhinorrhea and sinus pressure. Negative for dental problem, drooling, ear discharge, facial swelling, mouth sores, nosebleeds, sneezing, sore throat, tinnitus, trouble swallowing and voice change.   Respiratory: Negative.   Cardiovascular: Negative.   Gastrointestinal: Negative.   Genitourinary: Negative.   Musculoskeletal: Negative.        Objective:   Physical Exam  Constitutional: She is oriented to person, place, and time. She appears well-developed and well-nourished.  HENT:  Right Ear: External ear normal. No mastoid tenderness. Tympanic membrane is not injected, not perforated, not erythematous, not retracted and not bulging. A middle ear effusion is present. Decreased hearing is noted.  Left Ear: External ear normal. No mastoid tenderness. Tympanic membrane is not injected, not perforated, not erythematous, not retracted and not bulging. A middle ear effusion is present. Decreased hearing is noted.  Nose: Right sinus exhibits maxillary sinus tenderness. Left sinus exhibits maxillary sinus tenderness.  Mouth/Throat: Uvula is midline, oropharynx is clear and moist and mucous membranes are normal.  Left ear with cerrmen impaction, removed in office.    Eyes: Conjunctivae and EOM are normal. Pupils are equal, round, and reactive to light.  Neck: Neck supple.  Cardiovascular: Normal rate and regular rhythm.   Pulmonary/Chest: Effort normal and breath sounds normal. No respiratory distress. She has no wheezes.  Abdominal: Soft. Bowel sounds are normal.  Musculoskeletal: Normal range of motion.  Lymphadenopathy:    She has no cervical adenopathy.  Neurological: She is alert and oriented to person, place, and time.  Skin: Skin is warm and dry.      Assessment & Plan:  Left Cerumen impaction - stop using Qtips, irrigation used in the office without complications, use OTC drops/oil at home to prevent reoccurence  Sinusitis Will hold the zpak and take if she is not getting better, increase fluids, rest, cont allergy pill

## 2015-09-12 ENCOUNTER — Other Ambulatory Visit: Payer: Self-pay | Admitting: Physician Assistant

## 2015-09-12 MED ORDER — FLUCONAZOLE 150 MG PO TABS: 150.0000 mg | ORAL_TABLET | Freq: Every day | ORAL | Status: DC

## 2015-09-22 ENCOUNTER — Other Ambulatory Visit: Payer: Self-pay

## 2015-09-22 MED ORDER — LEVOTHYROXINE SODIUM 50 MCG PO TABS: 50.0000 ug | ORAL_TABLET | Freq: Every day | ORAL | Status: DC

## 2015-10-06 ENCOUNTER — Other Ambulatory Visit: Payer: Self-pay | Admitting: Neurological Surgery

## 2015-10-16 ENCOUNTER — Encounter (HOSPITAL_COMMUNITY)
Admission: RE | Admit: 2015-10-16 | Discharge: 2015-10-16 | Disposition: A | Discharge status: Home / Self Care | Payer: Medicare Other | Source: Ambulatory Visit | Attending: Neurological Surgery | Admitting: Neurological Surgery

## 2015-10-16 ENCOUNTER — Encounter (HOSPITAL_COMMUNITY): Payer: Self-pay

## 2015-10-16 DIAGNOSIS — M4317 Spondylolisthesis, lumbosacral region: Secondary | ICD-10-CM | POA: Insufficient documentation

## 2015-10-16 DIAGNOSIS — Z0183 Encounter for blood typing: Secondary | ICD-10-CM | POA: Diagnosis not present

## 2015-10-16 DIAGNOSIS — Z01812 Encounter for preprocedural laboratory examination: Secondary | ICD-10-CM | POA: Diagnosis not present

## 2015-10-16 LAB — BASIC METABOLIC PANEL
Anion gap: 9 (ref 5–15)
BUN: 16 mg/dL (ref 6–20)
CO2: 28 mmol/L (ref 22–32)
Calcium: 9.5 mg/dL (ref 8.9–10.3)
Chloride: 104 mmol/L (ref 101–111)
Creatinine, Ser: 0.98 mg/dL (ref 0.44–1.00)
GFR calc Af Amer: >60 mL/min (ref >60)
GFR calc non Af Amer: >60 mL/min (ref >60)
Glucose, Bld: 135 mg/dL — ABNORMAL HIGH (ref 65–99)
Potassium: 4 mmol/L (ref 3.5–5.1)
Sodium: 141 mmol/L (ref 135–145)

## 2015-10-16 LAB — CBC
HCT: 38.9 % (ref 36.0–46.0)
Hemoglobin: 12.3 g/dL (ref 12.0–15.0)
MCH: 28.5 pg (ref 26.0–34.0)
MCHC: 31.6 g/dL (ref 30.0–36.0)
MCV: 90 fL (ref 78.0–100.0)
Platelets: 283 10*3/uL (ref 150–400)
RBC: 4.32 MIL/uL (ref 3.87–5.11)
RDW: 13.2 % (ref 11.5–15.5)
WBC: 4.9 10*3/uL (ref 4.0–10.5)

## 2015-10-16 LAB — TYPE AND SCREEN
ABO/RH(D): O POS
Antibody Screen: NEGATIVE

## 2015-10-16 LAB — ABO/RH: ABO/RH(D): O POS

## 2015-10-16 LAB — SURGICAL PCR SCREEN
MRSA, PCR: NEGATIVE
Staphylococcus aureus: NEGATIVE

## 2015-10-16 NOTE — Progress Notes (Signed)
Cardiologist-denies having one  Medical Md is Dr.William Melford Aase  Echo done in her 20's-this is when MVP was diagnosed  Stress test report in epic from 2006  EKG in epic from 04-21-15  Denies having a CXR in past yr

## 2015-10-16 NOTE — Pre-Procedure Instructions (Signed)
Sarah Rodriguez  10/16/2015      [facility], [place] [address] Phone: [phone] Fax: [phone]  [facility], [place] [address] [place] [address] Phone: [phone] Fax: [phone]  [facility] [address] [place], [place] [address] Phone: [phone] Fax: [facility] [phone] - [place], [place] AT [facility] OF [place] [address] Phone: [phone] Fax: [phone]    Your procedure is scheduled on Mon, Nov 28 @ 10:35 AM  Report to [facility] Admitting at 7:30 AM  Call this number if you have problems the morning of surgery:  [phone]   Remember:  Do not eat food or drink liquids after midnight.  Take these medicines the morning of surgery with A SIP OF WATER Clonazepam(Klonopin),Cymbalta(Duloxetine),Allegra(Fexofenadine),Diflucan(Fluconazole),Flonase(Fluticasone),Gabapentin(Neurontin),Xopenex<Bring Your Inhaler With You>,Synthroid(Levothyroxine),Phenergan(Promethazine-if needed),and Pantoprazole(Protonix)              Stop taking your Meloxicam along with any Vitamins or Herbal Medications. No Goody's,BC's,Aleve,Ibuprofen,Advil,Motrin,or Fish Oil.   Do not wear jewelry, make-up or nail polish.  Do not wear lotions, powders, or perfumes.  You may wear deodorant.  Do not shave 48 hours prior to surgery.    Do not bring valuables to the hospital.  [facility] is not responsible for any belongings or valuables.  Contacts, dentures or bridgework may not be worn into surgery.  Leave your suitcase in the car.  After surgery it may be brought to your room.  For patients admitted to the hospital, discharge time will be determined by your treatment  team.  Patients discharged the day of surgery will not be allowed to drive home.    Special instructions:  [facility] - Preparing for Surgery  Before surgery, you can play an important role.  Because skin is not sterile, your skin needs to be as free of germs as possible.  You can reduce the number of germs on you skin by washing with CHG (chlorahexidine gluconate) soap before surgery.  CHG is an antiseptic cleaner which kills germs and bonds with the skin to continue killing germs even after washing.  Please DO NOT use if you have an allergy to CHG or antibacterial soaps.  If your skin becomes reddened/irritated stop using the CHG and inform your nurse when you arrive at Short Stay.  Do not shave (including legs and underarms) for at least 48 hours prior to the first CHG shower.  You may shave your face.  Please follow these instructions carefully:   1.  Shower with CHG Soap the night before surgery and the                                morning of Surgery.  2.  If you choose to wash your hair, wash your hair first as usual with your       normal shampoo.  3.  After you shampoo, rinse your hair and body thoroughly to remove the                      Shampoo.  4.  Use CHG as you would any other liquid soap.  You can apply chg directly  to the skin and wash gently with scrungie or a clean washcloth.  5.  Apply the CHG Soap to your body ONLY FROM THE NECK DOWN.        Do not use on open wounds or open sores.  Avoid contact with your eyes,       ears, mouth and genitals (private parts).  Wash genitals (private parts)       with your normal soap.  6.  Wash thoroughly, paying special attention to the area where your surgery        will be performed.  7.  Thoroughly rinse your body with warm water from the neck down.  8.  DO NOT shower/wash with your normal soap after using and rinsing off       the CHG Soap.  9.  Pat yourself dry with a clean towel.            10.  Wear clean pajamas.             11.  Place clean sheets on your bed the night of your first shower and do not        sleep with pets.  Day of Surgery  Do not apply any lotions/deoderants the morning of surgery.  Please wear clean clothes to the hospital/surgery center.    Please read over the following fact sheets that you were given. Pain Booklet, Coughing and Deep Breathing, Blood Transfusion Information, MRSA Information and Surgical Site Infection Prevention

## 2015-10-26 MED ORDER — CEFAZOLIN SODIUM-DEXTROSE 2-3 GM-% IV SOLR
2.0000 g | INTRAVENOUS | Status: AC
  Administered 2015-10-27: 2 g via INTRAVENOUS
  Filled 2015-10-26: qty 50

## 2015-10-27 ENCOUNTER — Encounter (HOSPITAL_COMMUNITY)
Admission: RE | Disposition: A | Discharge status: Home / Self Care | Payer: Self-pay | Source: Ambulatory Visit | Attending: Neurological Surgery

## 2015-10-27 ENCOUNTER — Inpatient Hospital Stay (HOSPITAL_COMMUNITY): Payer: Medicare Other

## 2015-10-27 ENCOUNTER — Inpatient Hospital Stay (HOSPITAL_COMMUNITY): Payer: Medicare Other | Admitting: Anesthesiology

## 2015-10-27 ENCOUNTER — Encounter (HOSPITAL_COMMUNITY): Payer: Self-pay | Admitting: Anesthesiology

## 2015-10-27 ENCOUNTER — Inpatient Hospital Stay (HOSPITAL_COMMUNITY)
Admission: RE | Admit: 2015-10-27 | Discharge: 2015-10-29 | DRG: 460 | Disposition: A | Discharge status: Home / Self Care | Payer: Medicare Other | Source: Ambulatory Visit | Attending: Neurological Surgery | Admitting: Neurological Surgery

## 2015-10-27 DIAGNOSIS — M4316 Spondylolisthesis, lumbar region: Secondary | ICD-10-CM

## 2015-10-27 DIAGNOSIS — E785 Hyperlipidemia, unspecified: Secondary | ICD-10-CM | POA: Diagnosis present

## 2015-10-27 DIAGNOSIS — M5416 Radiculopathy, lumbar region: Secondary | ICD-10-CM | POA: Diagnosis present

## 2015-10-27 DIAGNOSIS — K219 Gastro-esophageal reflux disease without esophagitis: Secondary | ICD-10-CM | POA: Diagnosis present

## 2015-10-27 DIAGNOSIS — Z79899 Other long term (current) drug therapy: Secondary | ICD-10-CM | POA: Diagnosis not present

## 2015-10-27 DIAGNOSIS — Z885 Allergy status to narcotic agent status: Secondary | ICD-10-CM | POA: Diagnosis not present

## 2015-10-27 DIAGNOSIS — M549 Dorsalgia, unspecified: Secondary | ICD-10-CM

## 2015-10-27 DIAGNOSIS — E039 Hypothyroidism, unspecified: Secondary | ICD-10-CM | POA: Diagnosis present

## 2015-10-27 DIAGNOSIS — M4317 Spondylolisthesis, lumbosacral region: Secondary | ICD-10-CM | POA: Diagnosis present

## 2015-10-27 DIAGNOSIS — Z87891 Personal history of nicotine dependence: Secondary | ICD-10-CM

## 2015-10-27 DIAGNOSIS — Z981 Arthrodesis status: Secondary | ICD-10-CM | POA: Diagnosis not present

## 2015-10-27 DIAGNOSIS — J45909 Unspecified asthma, uncomplicated: Secondary | ICD-10-CM | POA: Diagnosis present

## 2015-10-27 DIAGNOSIS — Z8249 Family history of ischemic heart disease and other diseases of the circulatory system: Secondary | ICD-10-CM | POA: Diagnosis not present

## 2015-10-27 DIAGNOSIS — F329 Major depressive disorder, single episode, unspecified: Secondary | ICD-10-CM | POA: Diagnosis present

## 2015-10-27 DIAGNOSIS — I10 Essential (primary) hypertension: Secondary | ICD-10-CM | POA: Diagnosis present

## 2015-10-27 DIAGNOSIS — M4800 Spinal stenosis, site unspecified: Principal | ICD-10-CM | POA: Diagnosis present

## 2015-10-27 DIAGNOSIS — Z888 Allergy status to other drugs, medicaments and biological substances status: Secondary | ICD-10-CM

## 2015-10-27 SURGERY — POSTERIOR LUMBAR FUSION 1 LEVEL
Anesthesia: General | Site: Back

## 2015-10-27 MED ORDER — SODIUM CHLORIDE 0.9 % IR SOLN
Status: DC | PRN
  Administered 2015-10-27: 500 mL

## 2015-10-27 MED ORDER — ENALAPRIL MALEATE 20 MG PO TABS
20.0000 mg | ORAL_TABLET | Freq: Every day | ORAL | Status: DC
  Administered 2015-10-27 – 2015-10-29 (×3): 20 mg via ORAL
  Filled 2015-10-27 (×3): qty 1

## 2015-10-27 MED ORDER — LORATADINE 10 MG PO TABS
10.0000 mg | ORAL_TABLET | Freq: Every day | ORAL | Status: DC
  Administered 2015-10-28 – 2015-10-29 (×2): 10 mg via ORAL
  Filled 2015-10-27 (×2): qty 1

## 2015-10-27 MED ORDER — HYDROCODONE-ACETAMINOPHEN 5-325 MG PO TABS
1.0000 | ORAL_TABLET | ORAL | Status: DC | PRN
  Administered 2015-10-27 – 2015-10-28 (×2): 2 via ORAL
  Filled 2015-10-27 (×2): qty 2

## 2015-10-27 MED ORDER — CEFAZOLIN SODIUM 1-5 GM-% IV SOLN
1.0000 g | Freq: Three times a day (TID) | INTRAVENOUS | Status: AC
  Administered 2015-10-27 – 2015-10-28 (×2): 1 g via INTRAVENOUS
  Filled 2015-10-27 (×2): qty 50

## 2015-10-27 MED ORDER — MAGNESIUM 250 MG PO TABS: 1.0000 | ORAL_TABLET | Freq: Every day | ORAL | Status: DC

## 2015-10-27 MED ORDER — FENTANYL CITRATE (PF) 250 MCG/5ML IJ SOLN
INTRAMUSCULAR | Status: AC
  Filled 2015-10-27: qty 5

## 2015-10-27 MED ORDER — GABAPENTIN 300 MG PO CAPS
300.0000 mg | ORAL_CAPSULE | Freq: Two times a day (BID) | ORAL | Status: DC
  Administered 2015-10-27 – 2015-10-29 (×4): 300 mg via ORAL
  Filled 2015-10-27 (×4): qty 1

## 2015-10-27 MED ORDER — FLEET ENEMA 7-19 GM/118ML RE ENEM: 1.0000 | ENEMA | Freq: Once | RECTAL | Status: DC | PRN

## 2015-10-27 MED ORDER — SODIUM CHLORIDE 0.9 % IJ SOLN: 3.0000 mL | Freq: Two times a day (BID) | INTRAMUSCULAR | Status: DC

## 2015-10-27 MED ORDER — PHENYLEPHRINE HCL 10 MG/ML IJ SOLN
10.0000 mg | INTRAVENOUS | Status: DC | PRN
  Administered 2015-10-27: 20 ug/min via INTRAVENOUS

## 2015-10-27 MED ORDER — NEOSTIGMINE METHYLSULFATE 10 MG/10ML IV SOLN
INTRAVENOUS | Status: DC | PRN
  Administered 2015-10-27: 3 mg via INTRAVENOUS

## 2015-10-27 MED ORDER — DEXTROSE 5 % IV SOLN
500.0000 mg | Freq: Four times a day (QID) | INTRAVENOUS | Status: DC | PRN
  Filled 2015-10-27: qty 5

## 2015-10-27 MED ORDER — ELETRIPTAN HYDROBROMIDE 40 MG PO TABS
40.0000 mg | ORAL_TABLET | ORAL | Status: DC | PRN
  Filled 2015-10-27: qty 1

## 2015-10-27 MED ORDER — OXYCODONE HCL 5 MG/5ML PO SOLN: 5.0000 mg | Freq: Once | ORAL | Status: DC | PRN

## 2015-10-27 MED ORDER — ONDANSETRON HCL 4 MG/2ML IJ SOLN: 4.0000 mg | Freq: Four times a day (QID) | INTRAMUSCULAR | Status: DC | PRN

## 2015-10-27 MED ORDER — ACETAMINOPHEN 325 MG PO TABS: 650.0000 mg | ORAL_TABLET | ORAL | Status: DC | PRN

## 2015-10-27 MED ORDER — DEXAMETHASONE SODIUM PHOSPHATE 10 MG/ML IJ SOLN
INTRAMUSCULAR | Status: DC | PRN
  Administered 2015-10-27: 10 mg via INTRAVENOUS

## 2015-10-27 MED ORDER — GLYCOPYRROLATE 0.2 MG/ML IJ SOLN
INTRAMUSCULAR | Status: DC | PRN
  Administered 2015-10-27: 0.4 mg via INTRAVENOUS

## 2015-10-27 MED ORDER — LEVALBUTEROL HCL 0.63 MG/3ML IN NEBU
0.6300 mg | INHALATION_SOLUTION | RESPIRATORY_TRACT | Status: DC | PRN
  Filled 2015-10-27: qty 3

## 2015-10-27 MED ORDER — MORPHINE SULFATE (PF) 2 MG/ML IV SOLN
1.0000 mg | INTRAVENOUS | Status: DC | PRN
  Administered 2015-10-27: 4 mg via INTRAVENOUS
  Filled 2015-10-27: qty 2

## 2015-10-27 MED ORDER — PROMETHAZINE HCL 25 MG PO TABS
25.0000 mg | ORAL_TABLET | Freq: Four times a day (QID) | ORAL | Status: DC | PRN
Start: 2015-10-27 — End: 2015-10-29

## 2015-10-27 MED ORDER — OXYCODONE HCL 5 MG PO TABS: 5.0000 mg | ORAL_TABLET | Freq: Once | ORAL | Status: DC | PRN

## 2015-10-27 MED ORDER — ACETAMINOPHEN 650 MG RE SUPP: 650.0000 mg | RECTAL | Status: DC | PRN

## 2015-10-27 MED ORDER — MIDAZOLAM HCL 5 MG/5ML IJ SOLN
INTRAMUSCULAR | Status: DC | PRN
  Administered 2015-10-27: 1 mg via INTRAVENOUS

## 2015-10-27 MED ORDER — SODIUM CHLORIDE 0.9 % IV SOLN: INTRAVENOUS | Status: DC

## 2015-10-27 MED ORDER — ONDANSETRON HCL 4 MG/2ML IJ SOLN
INTRAMUSCULAR | Status: DC | PRN
  Administered 2015-10-27: 4 mg via INTRAVENOUS

## 2015-10-27 MED ORDER — BUPIVACAINE HCL (PF) 0.5 % IJ SOLN
INTRAMUSCULAR | Status: DC | PRN
  Administered 2015-10-27: 5 mL

## 2015-10-27 MED ORDER — POLYETHYLENE GLYCOL 3350 17 G PO PACK: 17.0000 g | PACK | Freq: Every day | ORAL | Status: DC | PRN

## 2015-10-27 MED ORDER — HYDROMORPHONE HCL 1 MG/ML IJ SOLN
0.2500 mg | INTRAMUSCULAR | Status: DC | PRN
  Administered 2015-10-27 (×3): 0.5 mg via INTRAVENOUS

## 2015-10-27 MED ORDER — OXYCODONE-ACETAMINOPHEN 5-325 MG PO TABS
1.0000 | ORAL_TABLET | ORAL | Status: DC | PRN
  Administered 2015-10-28 – 2015-10-29 (×4): 2 via ORAL
  Filled 2015-10-27 (×4): qty 2

## 2015-10-27 MED ORDER — PHENOL 1.4 % MT LIQD: 1.0000 | OROMUCOSAL | Status: DC | PRN

## 2015-10-27 MED ORDER — LACTATED RINGERS IV SOLN
INTRAVENOUS | Status: DC
  Administered 2015-10-27 (×2): via INTRAVENOUS

## 2015-10-27 MED ORDER — LIDOCAINE-EPINEPHRINE 1 %-1:100000 IJ SOLN
INTRAMUSCULAR | Status: DC | PRN
  Administered 2015-10-27: 5 mL

## 2015-10-27 MED ORDER — FENTANYL CITRATE (PF) 100 MCG/2ML IJ SOLN
INTRAMUSCULAR | Status: DC | PRN
  Administered 2015-10-27 (×5): 50 ug via INTRAVENOUS

## 2015-10-27 MED ORDER — MOMETASONE FURO-FORMOTEROL FUM 100-5 MCG/ACT IN AERO
2.0000 | INHALATION_SPRAY | Freq: Two times a day (BID) | RESPIRATORY_TRACT | Status: DC
  Administered 2015-10-28 – 2015-10-29 (×3): 2 via RESPIRATORY_TRACT
  Filled 2015-10-27: qty 8.8

## 2015-10-27 MED ORDER — LEVALBUTEROL TARTRATE 45 MCG/ACT IN AERO: 2.0000 | INHALATION_SPRAY | RESPIRATORY_TRACT | Status: DC | PRN

## 2015-10-27 MED ORDER — PROPOFOL 10 MG/ML IV BOLUS
INTRAVENOUS | Status: DC | PRN
Start: 2015-10-27 — End: 2015-10-27
  Administered 2015-10-27: 150 mg via INTRAVENOUS

## 2015-10-27 MED ORDER — MAGNESIUM OXIDE 400 (241.3 MG) MG PO TABS
200.0000 mg | ORAL_TABLET | Freq: Every day | ORAL | Status: DC
  Administered 2015-10-27 – 2015-10-29 (×3): 200 mg via ORAL
  Filled 2015-10-27 (×3): qty 0.5

## 2015-10-27 MED ORDER — ROCURONIUM BROMIDE 100 MG/10ML IV SOLN
INTRAVENOUS | Status: DC | PRN
  Administered 2015-10-27: 10 mg via INTRAVENOUS
  Administered 2015-10-27: 40 mg via INTRAVENOUS

## 2015-10-27 MED ORDER — THROMBIN 20000 UNITS EX SOLR
CUTANEOUS | Status: DC | PRN
  Administered 2015-10-27: 20 mL via TOPICAL

## 2015-10-27 MED ORDER — 0.9 % SODIUM CHLORIDE (POUR BTL) OPTIME
TOPICAL | Status: DC | PRN
  Administered 2015-10-27: 1000 mL

## 2015-10-27 MED ORDER — MENTHOL 3 MG MT LOZG: 1.0000 | LOZENGE | OROMUCOSAL | Status: DC | PRN

## 2015-10-27 MED ORDER — LEVOTHYROXINE SODIUM 25 MCG PO TABS
25.0000 ug | ORAL_TABLET | Freq: Every day | ORAL | Status: DC
  Administered 2015-10-28 – 2015-10-29 (×2): 25 ug via ORAL
  Filled 2015-10-27 (×3): qty 1

## 2015-10-27 MED ORDER — MIDAZOLAM HCL 2 MG/2ML IJ SOLN
INTRAMUSCULAR | Status: AC
  Filled 2015-10-27: qty 2

## 2015-10-27 MED ORDER — GLYCOPYRROLATE 0.2 MG/ML IJ SOLN
INTRAMUSCULAR | Status: AC
  Filled 2015-10-27: qty 2

## 2015-10-27 MED ORDER — MONTELUKAST SODIUM 10 MG PO TABS
10.0000 mg | ORAL_TABLET | Freq: Every day | ORAL | Status: DC
  Administered 2015-10-27 – 2015-10-28 (×2): 10 mg via ORAL
  Filled 2015-10-27 (×3): qty 1

## 2015-10-27 MED ORDER — KETOROLAC TROMETHAMINE 15 MG/ML IJ SOLN
15.0000 mg | Freq: Four times a day (QID) | INTRAMUSCULAR | Status: AC
  Administered 2015-10-27 – 2015-10-28 (×3): 15 mg via INTRAVENOUS
  Filled 2015-10-27 (×2): qty 1

## 2015-10-27 MED ORDER — SODIUM CHLORIDE 0.9 % IV SOLN: 250.0000 mL | INTRAVENOUS | Status: DC

## 2015-10-27 MED ORDER — PROPOFOL 10 MG/ML IV BOLUS
INTRAVENOUS | Status: AC
  Filled 2015-10-27: qty 20

## 2015-10-27 MED ORDER — PHENYLEPHRINE HCL 10 MG/ML IJ SOLN
INTRAMUSCULAR | Status: DC | PRN
  Administered 2015-10-27 (×5): 80 ug via INTRAVENOUS

## 2015-10-27 MED ORDER — METHOCARBAMOL 500 MG PO TABS
500.0000 mg | ORAL_TABLET | Freq: Four times a day (QID) | ORAL | Status: DC | PRN
  Administered 2015-10-27 – 2015-10-29 (×4): 500 mg via ORAL
  Filled 2015-10-27 (×4): qty 1

## 2015-10-27 MED ORDER — ONDANSETRON HCL 4 MG/2ML IJ SOLN: 4.0000 mg | INTRAMUSCULAR | Status: DC | PRN

## 2015-10-27 MED ORDER — DULOXETINE HCL 30 MG PO CPEP
90.0000 mg | ORAL_CAPSULE | Freq: Every day | ORAL | Status: DC
  Administered 2015-10-28 – 2015-10-29 (×2): 90 mg via ORAL
  Filled 2015-10-27 (×2): qty 3

## 2015-10-27 MED ORDER — FLUCONAZOLE 150 MG PO TABS
150.0000 mg | ORAL_TABLET | Freq: Every day | ORAL | Status: DC
  Administered 2015-10-27 – 2015-10-29 (×3): 150 mg via ORAL
  Filled 2015-10-27 (×3): qty 1

## 2015-10-27 MED ORDER — DOCUSATE SODIUM 100 MG PO CAPS
100.0000 mg | ORAL_CAPSULE | Freq: Two times a day (BID) | ORAL | Status: DC
  Administered 2015-10-27 – 2015-10-29 (×4): 100 mg via ORAL
  Filled 2015-10-27 (×4): qty 1

## 2015-10-27 MED ORDER — LIDOCAINE HCL (CARDIAC) 20 MG/ML IV SOLN
INTRAVENOUS | Status: AC
  Filled 2015-10-27: qty 5

## 2015-10-27 MED ORDER — ONDANSETRON HCL 4 MG/2ML IJ SOLN
INTRAMUSCULAR | Status: AC
  Filled 2015-10-27: qty 2

## 2015-10-27 MED ORDER — HYDROMORPHONE HCL 1 MG/ML IJ SOLN
INTRAMUSCULAR | Status: AC
  Filled 2015-10-27: qty 1

## 2015-10-27 MED ORDER — ALUM & MAG HYDROXIDE-SIMETH 200-200-20 MG/5ML PO SUSP: 30.0000 mL | Freq: Four times a day (QID) | ORAL | Status: DC | PRN

## 2015-10-27 MED ORDER — PANTOPRAZOLE SODIUM 40 MG PO TBEC
40.0000 mg | DELAYED_RELEASE_TABLET | Freq: Two times a day (BID) | ORAL | Status: DC
  Administered 2015-10-27 – 2015-10-28 (×3): 40 mg via ORAL
  Filled 2015-10-27 (×4): qty 1

## 2015-10-27 MED ORDER — SODIUM CHLORIDE 0.9 % IJ SOLN: 3.0000 mL | INTRAMUSCULAR | Status: DC | PRN

## 2015-10-27 MED ORDER — SENNA 8.6 MG PO TABS
1.0000 | ORAL_TABLET | Freq: Two times a day (BID) | ORAL | Status: DC
  Administered 2015-10-27 – 2015-10-29 (×4): 8.6 mg via ORAL
  Filled 2015-10-27 (×4): qty 1

## 2015-10-27 MED ORDER — MOMETASONE FURO-FORMOTEROL FUM 100-5 MCG/ACT IN AERO: 2.0000 | INHALATION_SPRAY | Freq: Two times a day (BID) | RESPIRATORY_TRACT | Status: DC

## 2015-10-27 MED ORDER — BISACODYL 10 MG RE SUPP: 10.0000 mg | Freq: Every day | RECTAL | Status: DC | PRN

## 2015-10-27 MED ORDER — LIDOCAINE HCL (CARDIAC) 20 MG/ML IV SOLN
INTRAVENOUS | Status: DC | PRN
  Administered 2015-10-27: 60 mg via INTRAVENOUS

## 2015-10-27 MED ORDER — ATORVASTATIN CALCIUM 20 MG PO TABS
10.0000 mg | ORAL_TABLET | Freq: Every day | ORAL | Status: DC
  Administered 2015-10-27 – 2015-10-28 (×2): 10 mg via ORAL
  Filled 2015-10-27 (×2): qty 1

## 2015-10-27 MED ORDER — NEOSTIGMINE METHYLSULFATE 10 MG/10ML IV SOLN
INTRAVENOUS | Status: AC
  Filled 2015-10-27: qty 1

## 2015-10-27 MED ORDER — CLONAZEPAM 0.5 MG PO TABS
0.2500 mg | ORAL_TABLET | Freq: Every evening | ORAL | Status: DC | PRN
  Administered 2015-10-27 – 2015-10-28 (×2): 0.25 mg via ORAL
  Filled 2015-10-27 (×2): qty 1

## 2015-10-27 MED ORDER — ROCURONIUM BROMIDE 50 MG/5ML IV SOLN
INTRAVENOUS | Status: AC
  Filled 2015-10-27: qty 1

## 2015-10-27 MED ORDER — FLUTICASONE PROPIONATE 50 MCG/ACT NA SUSP
1.0000 | Freq: Every day | NASAL | Status: DC
Start: 2015-10-27 — End: 2015-10-29
  Administered 2015-10-27: 1 via NASAL
  Filled 2015-10-27: qty 16

## 2015-10-27 MED ORDER — KETOROLAC TROMETHAMINE 15 MG/ML IJ SOLN
INTRAMUSCULAR | Status: AC
  Filled 2015-10-27: qty 1

## 2015-10-27 MED ORDER — ACETAMINOPHEN 10 MG/ML IV SOLN
INTRAVENOUS | Status: AC
  Administered 2015-10-27: 1000 mg via INTRAVENOUS
  Filled 2015-10-27: qty 100

## 2015-10-27 SURGICAL SUPPLY — 60 items
ADH SKN CLS APL DERMABOND .7 (GAUZE/BANDAGES/DRESSINGS) ×1
ADH SKN CLS LQ APL DERMABOND (GAUZE/BANDAGES/DRESSINGS) ×1
BAG DECANTER FOR FLEXI CONT (MISCELLANEOUS) ×3 IMPLANT
BLADE CLIPPER SURG (BLADE) IMPLANT
BONE MATRIX OSTEOCEL PRO MED (Bone Implant) ×2 IMPLANT
BUR MATCHSTICK NEURO 3.0 LAGG (BURR) ×3 IMPLANT
CAGE PLIF 8X9X23-12 LUMBAR (Cage) ×4 IMPLANT
CANISTER SUCT 3000ML PPV (MISCELLANEOUS) ×3 IMPLANT
CONT SPEC 4OZ CLIKSEAL STRL BL (MISCELLANEOUS) ×3 IMPLANT
COVER BACK TABLE 60X90IN (DRAPES) ×3 IMPLANT
DECANTER SPIKE VIAL GLASS SM (MISCELLANEOUS) ×3 IMPLANT
DERMABOND ADHESIVE PROPEN (GAUZE/BANDAGES/DRESSINGS) ×2
DERMABOND ADVANCED (GAUZE/BANDAGES/DRESSINGS) ×2
DERMABOND ADVANCED .7 DNX12 (GAUZE/BANDAGES/DRESSINGS) ×1 IMPLANT
DERMABOND ADVANCED .7 DNX6 (GAUZE/BANDAGES/DRESSINGS) IMPLANT
DRAPE C-ARM 42X72 X-RAY (DRAPES) ×6 IMPLANT
DRAPE LAPAROTOMY 100X72X124 (DRAPES) ×3 IMPLANT
DRAPE POUCH INSTRU U-SHP 10X18 (DRAPES) ×3 IMPLANT
DRAPE PROXIMA HALF (DRAPES) IMPLANT
DURAPREP 26ML APPLICATOR (WOUND CARE) ×3 IMPLANT
ELECT REM PT RETURN 9FT ADLT (ELECTROSURGICAL) ×3
ELECTRODE REM PT RTRN 9FT ADLT (ELECTROSURGICAL) ×1 IMPLANT
GAUZE SPONGE 4X4 12PLY STRL (GAUZE/BANDAGES/DRESSINGS) ×3 IMPLANT
GAUZE SPONGE 4X4 16PLY XRAY LF (GAUZE/BANDAGES/DRESSINGS) IMPLANT
GLOVE BIOGEL PI IND STRL 8.5 (GLOVE) ×2 IMPLANT
GLOVE BIOGEL PI INDICATOR 8.5 (GLOVE) ×4
GLOVE ECLIPSE 8.5 STRL (GLOVE) ×6 IMPLANT
GLOVE EXAM NITRILE LRG STRL (GLOVE) IMPLANT
GLOVE EXAM NITRILE MD LF STRL (GLOVE) IMPLANT
GLOVE EXAM NITRILE XL STR (GLOVE) IMPLANT
GLOVE EXAM NITRILE XS STR PU (GLOVE) IMPLANT
GOWN STRL REUS W/ TWL LRG LVL3 (GOWN DISPOSABLE) IMPLANT
GOWN STRL REUS W/ TWL XL LVL3 (GOWN DISPOSABLE) IMPLANT
GOWN STRL REUS W/TWL 2XL LVL3 (GOWN DISPOSABLE) ×6 IMPLANT
GOWN STRL REUS W/TWL LRG LVL3 (GOWN DISPOSABLE)
GOWN STRL REUS W/TWL XL LVL3 (GOWN DISPOSABLE)
HEMOSTAT POWDER KIT SURGIFOAM (HEMOSTASIS) IMPLANT
KIT BASIN OR (CUSTOM PROCEDURE TRAY) ×3 IMPLANT
KIT ROOM TURNOVER OR (KITS) ×3 IMPLANT
NEEDLE HYPO 22GX1.5 SAFETY (NEEDLE) ×3 IMPLANT
NS IRRIG 1000ML POUR BTL (IV SOLUTION) ×3 IMPLANT
PACK LAMINECTOMY NEURO (CUSTOM PROCEDURE TRAY) ×3 IMPLANT
PAD ARMBOARD 7.5X6 YLW CONV (MISCELLANEOUS) ×9 IMPLANT
PATTIES SURGICAL .5 X1 (DISPOSABLE) ×3 IMPLANT
ROD RELINE-O LORD 5.5X35MM (Rod) ×4 IMPLANT
SCREW LOCK RELINE 5.5 TULIP (Screw) ×8 IMPLANT
SCREW RELINE-O POLY 6.5X40 (Screw) ×4 IMPLANT
SCREW RELINE-O POLY 6.5X45 (Screw) ×4 IMPLANT
SPONGE LAP 4X18 X RAY DECT (DISPOSABLE) IMPLANT
SPONGE SURGIFOAM ABS GEL 100 (HEMOSTASIS) ×3 IMPLANT
SUT VIC AB 1 CT1 18XBRD ANBCTR (SUTURE) ×1 IMPLANT
SUT VIC AB 1 CT1 8-18 (SUTURE) ×3
SUT VIC AB 2-0 CP2 18 (SUTURE) ×3 IMPLANT
SUT VIC AB 3-0 SH 8-18 (SUTURE) ×3 IMPLANT
SYR 3ML LL SCALE MARK (SYRINGE) ×12 IMPLANT
TOWEL OR 17X24 6PK STRL BLUE (TOWEL DISPOSABLE) ×3 IMPLANT
TOWEL OR 17X26 10 PK STRL BLUE (TOWEL DISPOSABLE) ×3 IMPLANT
TRAP SPECIMEN MUCOUS 40CC (MISCELLANEOUS) ×3 IMPLANT
TRAY FOLEY W/METER SILVER 14FR (SET/KITS/TRAYS/PACK) ×3 IMPLANT
WATER STERILE IRR 1000ML POUR (IV SOLUTION) ×3 IMPLANT

## 2015-10-27 NOTE — Anesthesia Preprocedure Evaluation (Signed)
Anesthesia Evaluation  Patient identified by MRN, date of birth, ID band Patient awake    Reviewed: Allergy & Precautions, NPO status , Patient's Chart, lab work & pertinent test results  Airway Mallampati: II   Neck ROM: full    Dental   Pulmonary shortness of breath, asthma , former smoker,    breath sounds clear to auscultation       Cardiovascular hypertension,  Rhythm:regular Rate:Normal     Neuro/Psych Depression    GI/Hepatic GERD  ,  Endo/Other  Hypothyroidism   Renal/GU      Musculoskeletal   Abdominal   Peds  Hematology   Anesthesia Other Findings   Reproductive/Obstetrics                             Anesthesia Physical Anesthesia Plan  ASA: II  Anesthesia Plan: General   Post-op Pain Management:    Induction: Intravenous  Airway Management Planned: Oral ETT  Additional Equipment:   Intra-op Plan:   Post-operative Plan: Extubation in OR  Informed Consent: I have reviewed the patients History and Physical, chart, labs and discussed the procedure including the risks, benefits and alternatives for the proposed anesthesia with the patient or authorized representative who has indicated his/her understanding and acceptance.     Plan Discussed with: CRNA, Anesthesiologist and Surgeon  Anesthesia Plan Comments:         Anesthesia Quick Evaluation

## 2015-10-27 NOTE — Transfer of Care (Signed)
Immediate Anesthesia Transfer of Care Note  Patient: Sarah Rodriguez  Procedure(s) Performed: Procedure(s) with comments: L5-S1 Posterior lumbar interbody fusion (N/A) - L5-S1 Posterior lumbar interbody fusion  Patient Location: PACU  Anesthesia Type:General  Level of Consciousness: sedated, patient cooperative and responds to stimulation  Airway & Oxygen Therapy: Patient Spontanous Breathing and Patient connected to nasal cannula oxygen  Post-op Assessment: Report given to RN, Post -op Vital signs reviewed and stable and Patient moving all extremities  Post vital signs: Reviewed and stable  Last Vitals:  Filed Vitals:   10/27/15 0805  BP: 118/63  Pulse: 72  Temp: 36.6 C  Resp: 20    Complications: No apparent anesthesia complications

## 2015-10-27 NOTE — Progress Notes (Signed)
Patient ID: Sarah Rodriguez, female   DOB: [birth year], 59 y.o.   MRN: [id number] Vital signs are stable Feels comfortable Legs are not hurting Stable postop

## 2015-10-27 NOTE — Anesthesia Procedure Notes (Signed)
Procedure Name: Intubation Date/Time: 10/27/2015 12:14 PM Performed by: Rebekah Chesterfield L Pre-anesthesia Checklist: Emergency Drugs available, Suction available, Patient being monitored, Patient identified and Timeout performed Patient Re-evaluated:Patient Re-evaluated prior to inductionOxygen Delivery Method: Circle system utilized Preoxygenation: Pre-oxygenation with 100% oxygen Intubation Type: IV induction Ventilation: Mask ventilation without difficulty Laryngoscope Size: Mac and 3 Grade View: Grade I Tube type: Oral Tube size: 7.5 mm Number of attempts: 1 Airway Equipment and Method: Stylet Placement Confirmation: ETT inserted through vocal cords under direct vision,  breath sounds checked- equal and bilateral and positive ETCO2 Secured at: 20 cm Tube secured with: Tape Dental Injury: Teeth and Oropharynx as per pre-operative assessment

## 2015-10-27 NOTE — H&P (Signed)
Sarah Rodriguez is an 59 y.o. female.   Chief Complaint: Back and bilateral leg pain for several years. HPI: Sarah Rodriguez is a 59 year old individual who has had significant problems with back pain for several years back in 2013 she had a herniated nucleus pulposus at L5-S1 on the left side. She underwent microdiscectomy. Postoperatively she had persistent pain in the buttock and left lower extremity. Workup at that time demonstrated that she had typical postoperative changes no evidence of instability and essentially was treated conservatively for last several years. Unless she's had a severely limited ability to function should not been able to return to the workplace and she has had progressively worsening pain. This past year I repeated studies on her back which demonstrate now that she has a degenerative spondylolisthesis at the level of L5-S1. She is also developing weakness in the tibialis anterior groups bilaterally. She's been advised regarding the need for surgical decompression and stabilization of the joint at this time.  Past Medical History  Diagnosis Date  . History of migraine     takes Relpax daily as needed  . HSV (herpes simplex virus) infection   . Hyperlipidemia     takes Atorvastatin daily  . Hypertension     takes Benicar daily  . Heart murmur   . MVP (mitral valve prolapse)   . Asthma     Dulera daily  . Seasonal allergies     takes Allegra daily  . Shortness of breath     with exertion  . Pneumonia     hx of--been many yrs ago  . Chronic back pain     HNP  . GERD (gastroesophageal reflux disease)     takes Protonix daily   . Hypothyroidism     takes Synthroid daily  . Depression     takes Clonazepam nightly  . Complication of anesthesia     slow to wake up    Past Surgical History  Procedure Laterality Date  . Dilation and curettage of uterus  2011  . Ablation  2011  . Cholecystectomy  2008  . Tonsillectomy      at age 72  . Buttocks surgery      at  age 78  . Cesarean section  1992  . Right knee arthroscopy    . Cervical fusion  03/2010  . Colonoscopy    . Esophagogastroduodenoscopy    . Lumbar laminectomy/decompression microdiscectomy  10/24/2012    Procedure: LUMBAR LAMINECTOMY/DECOMPRESSION MICRODISCECTOMY 1 LEVEL;  Surgeon: Kristeen Miss, MD;  Location: [facility];  Service: Neurosurgery;  Laterality: Right;  Right Lumbar five-Sacral One Microdiskectomy    Family History  Problem Relation Age of Onset  . Arthritis Mother   . Heart attack Mother   . Depression Mother   . Allergies Mother   . Hypertension Mother   . Heart attack Father    Social History:  reports that she has quit smoking. She does not have any smokeless tobacco history on file. She reports that she does not drink alcohol or use illicit drugs.  Allergies:  Allergies  Allergen Reactions  . Demerol [Meperidine] Other (See Comments)    REACTION: unknown--itching  . Epinephrine Other (See Comments)    "Sensitivity."  . Niacin And Related Rash    Medications Prior to Admission  Medication Sig Dispense Refill  . atorvastatin (LIPITOR) 10 MG tablet take 1 tablet by mouth once daily 30 tablet 5  . Cholecalciferol (VITAMIN D PO) Take 4,000 Units by mouth  daily.     Marland Kitchen CINNAMON PO Take 1,000 Units by mouth 2 (two) times daily.     . clonazePAM (KLONOPIN) 0.5 MG tablet Take 0.125 mg by mouth 2 (two) times daily as needed. For sleep.    . Cyanocobalamin (VITAMIN B-12) 2500 MCG SUBL Place 1 tablet under the tongue daily.     . DULoxetine (CYMBALTA) 30 MG capsule Take 1 capsule tid. (Patient taking differently: Take 90 mg by mouth daily. ) 90 capsule 5  . enalapril (VASOTEC) 20 MG tablet TAKE 1 TABLET BY MOUTH DAILY (Patient taking differently: TAKE 10 MG  BY MOUTH DAILY) 30 tablet 3  . fexofenadine-pseudoephedrine (ALLEGRA-D 12 HOUR) 60-120 MG per tablet Take 1 tablet by mouth daily.    . fluconazole (DIFLUCAN) 150 MG tablet Take 1 tablet (150 mg total) by mouth  daily. 1 tablet 3  . fluticasone (FLONASE) 50 MCG/ACT nasal spray SHAKE WELL AND USE 2 SPRAYS IN EACH NOSTRIL EVERY EVENING 16 g 0  . fluticasone-salmeterol (ADVAIR HFA) 115-21 MCG/ACT inhaler Inhale 2 puffs into the lungs as needed. 1 Inhaler 3  . gabapentin (NEURONTIN) 300 MG capsule Take 300 mg by mouth 2 (two) times daily.   0  . levothyroxine (SYNTHROID) 50 MCG tablet Take 1 tablet (50 mcg total) by mouth daily. (Patient taking differently: Take 25 mcg by mouth daily before breakfast. ) 90 tablet 0  . Magnesium 250 MG TABS Take 1 tablet by mouth daily.     . meloxicam (MOBIC) 15 MG tablet Take 1 tablet (15 mg total) by mouth daily. 90 tablet 1  . montelukast (SINGULAIR) 10 MG tablet Take 1 tablet (10 mg total) by mouth at bedtime. 90 tablet 1  . pantoprazole (PROTONIX) 40 MG tablet Take 40 mg by mouth 2 (two) times daily.     . promethazine (PHENERGAN) 25 MG tablet Take 1 tablet (25 mg total) by mouth every 6 (six) hours as needed for nausea or vomiting. 30 tablet 2  . eletriptan (RELPAX) 40 MG tablet Take 1 tablet (40 mg total) by mouth as needed for migraine or headache. One tablet by mouth at onset of headache. May repeat in 2 hours if headache persists or recurs. 24 tablet 11  . levalbuterol (XOPENEX HFA) 45 MCG/ACT inhaler Inhale 2 puffs into the lungs every 4 (four) hours as needed for wheezing.    . nystatin (MYCOSTATIN) 100000 UNIT/ML suspension Take 5 mLs (500,000 Units total) by mouth 4 (four) times daily. (Patient taking differently: Take 5 mLs by mouth 4 (four) times daily as needed. ) 120 mL 0  . predniSONE (DELTASONE) 20 MG tablet 2 tablets daily for 3 days, 1 tablet daily for 4 days. 10 tablet 0    No results found for this or any previous visit (from the past 48 hour(s)). No results found.  Review of Systems  HENT: Negative.   Eyes: Negative.   Respiratory: Negative.   Cardiovascular: Negative.   Gastrointestinal: Negative.   Genitourinary: Negative.   Musculoskeletal:  Positive for back pain.  Skin: Negative.   Neurological: Positive for tingling, focal weakness and weakness.  Psychiatric/Behavioral: Negative.     Blood pressure 118/63, pulse 72, temperature 97.9 F (36.6 C), temperature source Oral, resp. rate 20, height 5\' 1"  (1.549 m), weight 51.71 kg (114 lb), SpO2 100 %. Physical Exam  Constitutional: She is oriented to person, place, and time. She appears well-developed and well-nourished.  HENT:  Head: Normocephalic and atraumatic.  Eyes: Conjunctivae and EOM are normal.  Pupils are equal, round, and reactive to light.  Neck: Normal range of motion. Neck supple.  Cardiovascular: Normal rate and regular rhythm.   Respiratory: Effort normal and breath sounds normal.  GI: Soft. Bowel sounds are normal.  Musculoskeletal:  Midline back pain to percussion and palpation. Motor function reveals mild tibialis anterior weakness in both lower extremity. Straight leg raising positive at 15 in either lower extremity.  Neurological: She is alert and oriented to person, place, and time.  Absent Achilles reflex on left. Motor function demonstrates decreased tibialis anterior function on the left and right side at 4+ out of 5. Straight leg raising is positive at 15 in either lower extremity. Station and gait are intact.  Skin: Skin is warm and dry.  Psychiatric: She has a normal mood and affect. Her behavior is normal. Judgment and thought content normal.     Assessment/Plan Spondylolisthesis L5-S1 with bilateral tibialis anterior weakness. Decompression L5-S1 with posterior lumbar interbody arthrodesis and pedicle screw fixations L5-S1.  Khloee Garza J 10/27/2015, 11:37 AM

## 2015-10-27 NOTE — Anesthesia Postprocedure Evaluation (Signed)
Anesthesia Post Note  Patient: Sarah Rodriguez  Procedure(s) Performed: Procedure(s) (LRB): L5-S1 Posterior lumbar interbody fusion (N/A)  Patient location during evaluation: PACU Anesthesia Type: General Level of consciousness: awake and alert and patient cooperative Pain management: pain level controlled Vital Signs Assessment: post-procedure vital signs reviewed and stable Respiratory status: spontaneous breathing and respiratory function stable Cardiovascular status: stable Anesthetic complications: no    Last Vitals:  Filed Vitals:   10/27/15 1608 10/27/15 1620  BP: 149/81   Pulse: 92 96  Temp:    Resp: 15 20    Last Pain:  Filed Vitals:   10/27/15 1621  PainSc: [place]

## 2015-10-27 NOTE — Op Note (Signed)
Date of surgery: 10/27/2015 Preoperative diagnosis: L5-S1 spondylolisthesis with bilateral lumbar radiculopathy Postoperative diagnosis: L5-S1 spondylolisthesis with bilateral lumbar radiculopathy Procedure: Laminectomy L5 decompression of L5 and S1 nerve roots total discectomy L5-S1 with more work than require for simple interbody fusion. Posterior lumbar interbody arthrodesis L5-S1 using local autograft and allograft and 12 lordotic peek spacers. Posterior fixation with pedicle screws L5-S1. Surgeon: Kristeen Miss First assistant: Deri Fuelling M.D. Anesthesia: Gen. endotracheal Indications: Sarah Rodriguez is a 59 year old individual who's had previous discectomy 2013. Since that time she's had persistent low lumbar back pain. Workup at that time demonstrated no, gating features however over the past few years the patient has had intermittent films and she now presents with a spondylolisthesis at the level of L5-S1 marked collapse of the disc space is also noted. There's by foraminal stenosis for the L5 nerve roots. She's been advised regarding surgical decompression and stabilization of the L5-S1 segment.  Procedure: The patient was brought to the operating room supine on a stretcher. After the smooth induction of general endotracheal anesthesia and placement of a Foley catheter, she was turned prone. The bony prominences were appropriately padded and protected. The back was prepped with alcohol and DuraPrep and draped in a sterile fashion. Midline incision was made through her previous midline incision this was carried down to the lumbar dorsal fascia. The incision was extended in either direction. The fascia was opened on either side of the midline to expose the sublaminar space at L5 and S1. This was confirmed radiographically. Then laminotomies were created bilaterally removing the inferior marginal lamina of L5 out to and including the entirety of the facet between L5 and S1. On the right side there was  substantial scar from previous surgery. This was carefully dissected and the lateral recesses it appeared almost as though there was a conjoined nerve root. This however was due to a thick syncytium of scar tissue. This was gradually mobilized and then by dissecting carefully the disc space could be identified. On the left-sided disc space was easily identified and had a large posterior protrusion. This was incised and subligamentous disc material was removed. Disc space was then evacuated of substantial quantity of severely degenerated and desiccated disc material. This allowed for distraction of the disc space which allowed for realignment of the spondylolisthesis. By working bilaterally we are able to remove all the disc that was degenerated within the disc space and using toothed curettes were able to remove all the endplate material to provide a good decorticated surface. Then the interspace was sized for a 12 lordotic 8 mm tall 23 mm long peek spacer.. This was packed with autograft and allograft and then camped into the interspace. An additional 12 mL of bone graft that was both autograft and allograft was used to fill the interspace. Once this was accomplished, pedicle entry sites were identified radiographically. A blunt pedicle probe was used to probe the holes then they were tapped to 6.5 mm diameter and 6.5 x 40 mm screws were placed in L5 and 6.5 x 45 mm screws were placed in S1. Short 5mm precontoured rods were used to connect the screws together. These were torqued into neutral construct. I will radiographs were obtained in the AP and lateral projection, identified good position of the hardware. The area was inspected carefully to make sure that no bone graft material had demonstrate and that the L5 and S1 nerve roots were well decompressed. When this was verified hemostasis was checked lumbar dorsal fascia was closed  with #1 Vicryls. 20 Vicryls used in the subcutaneous tissues, 30 Vicryls  subcuticularly. Dermabond was placed on the skin. Blood loss for the entire procedure was estimated at less than 250 mL.

## 2015-10-28 MED ORDER — DEXAMETHASONE 4 MG PO TABS
6.0000 mg | ORAL_TABLET | Freq: Once | ORAL | Status: AC
  Administered 2015-10-28: 6 mg via ORAL
  Filled 2015-10-28: qty 2

## 2015-10-28 NOTE — Progress Notes (Signed)
Utilization review completed.  

## 2015-10-28 NOTE — Progress Notes (Signed)
Occupational Therapy Evaluation Patient Details Name: Sarah Rodriguez MRN: [id number] DOB: [birth year] Today's Date: 10/28/2015    History of Present Illness Pt is a 59 y/o female who presents s/p L5-S1 PLIF on 10/27/15.   Clinical Impression   Pt progressing well after surgery. Pt completing ADL and functional mobility at supervision level with no AD. Pt and daughter verbalize and demonstrate good understanding of back precautions and safety. Educated pt and daughter on compensatory strategies for ADL and fall prevention strategies to increase safety and independence. Pt has no further acute OT needs. OT signing off.    Follow Up Recommendations  No OT follow up;Supervision - Intermittent    Equipment Recommendations       Recommendations for Other Services       Precautions / Restrictions Precautions Precautions: Fall;Back Precaution Booklet Issued: Yes (comment) Precaution Comments: Review back precautions. Pt able to recall 3/3 back precautions at end of session Required Braces or Orthoses: Spinal Brace Spinal Brace: Lumbar corset;Applied in sitting position Restrictions Weight Bearing Restrictions: No      Mobility Bed Mobility Overal bed mobility: Needs Assistance Bed Mobility: Rolling;Sidelying to Sit;Sit to Sidelying Rolling: Supervision Sidelying to sit: Supervision     Sit to sidelying: Supervision General bed mobility comments: Good demonstration of log roll technique. Verbal cues to move up in the bed using legs to push and not using arms to pull up.   Transfers Overall transfer level: Needs assistance Equipment used: None Transfers: Sit to/from Stand Sit to Stand: Supervision         General transfer comment: Supervision for safety as pt is slightly unstead initially. Verbal cues to bend at hips and keep back straight.     Balance Overall balance assessment: Needs assistance Sitting-balance support: No upper extremity supported;Feet  supported Sitting balance-Leahy Scale: Good     Standing balance support: No upper extremity supported;During functional activity Standing balance-Leahy Scale: Fair                              ADL Overall ADL's : Needs assistance/impaired                 Upper Body Dressing : Supervision/safety;Sitting;Cueing for sequencing Upper Body Dressing Details (indicate cue type and reason): Verbal cues for proper technique to don/doff brace Lower Body Dressing: Supervision/safety;Cueing for back precautions;Sitting/lateral leans Lower Body Dressing Details (indicate cue type and reason): Verbal cues to avoid excessive bending Toilet Transfer: Supervision/safety;Ambulation;Regular Toilet;Grab bars   Toileting- Clothing Manipulation and Hygiene: Supervision/safety;Adhering to back precautions;Sit to/from stand   Tub/ Shower Transfer: Tub transfer;Min guard;Adhering to back precautions;Cueing for sequencing;Ambulation   Functional mobility during ADLs: Supervision/safety General ADL Comments: Pt at supervision level for ADL except tub transfer. Educated pt to have daughter with her to get and out of shower and pt may borrow shower seat/tub transfer bench from family if needed. Pt demonstrated competence with donning/doffing brace and is able to cross ankle-over-knee to complete LB ADL. Completed precaution, compensatory strategy, and fall prevention strategy education with pt and daughter.      Vision Vision Assessment?: No apparent visual deficits   Perception     Praxis      Pertinent Vitals/Pain Pain Assessment: 0-10 Pain Score: 5  Faces Pain Scale: Hurts little more Pain Location: Back Pain Descriptors / Indicators: Aching;Sore;Operative site guarding Pain Intervention(s): Limited activity within patient's tolerance;Monitored during session;Premedicated before session;Repositioned;Ice applied     Hand Dominance  Right   Extremity/Trunk Assessment Upper  Extremity Assessment Upper Extremity Assessment: Overall WFL for tasks assessed   Lower Extremity Assessment Lower Extremity Assessment: Defer to PT evaluation   Cervical / Trunk Assessment Cervical / Trunk Assessment: Normal   Communication Communication Communication: No difficulties   Cognition Arousal/Alertness: Awake/alert Behavior During Therapy: WFL for tasks assessed/performed Overall Cognitive Status: Within Functional Limits for tasks assessed                     General Comments       Exercises       Shoulder Instructions      Home Living Family/patient expects to be discharged to:: Private residence Living Arrangements: Alone Available Help at Discharge: Family;Available 24 hours/day (For month of December) Type of Home: House Home Access: Stairs to enter [employer] of Steps: 4   Home Layout: Two level     Bathroom Shower/Tub: Tub/shower unit;Curtain Shower/tub characteristics: Architectural technologist: Standard Bathroom Accessibility: Yes   Home Equipment: Cane - single point   Additional Comments: Pt has access to walker and shower seat/TTB from family      Prior Functioning/Environment Level of Independence: Independent             OT Diagnosis: Generalized weakness;Acute pain   OT Problem List: Decreased strength;Decreased range of motion;Impaired balance (sitting and/or standing);Decreased activity tolerance;Decreased safety awareness;Decreased knowledge of use of DME or AE;Decreased knowledge of precautions;Pain   OT Treatment/Interventions:      OT Goals(Current goals can be found in the care plan section) Acute Rehab OT Goals Patient Stated Goal: To go home OT Goal Formulation: With patient Time For Goal Achievement: 11/11/15 Potential to Achieve Goals: Good  OT Frequency:     Barriers to D/C:            Co-evaluation              End of Session Equipment Utilized During Treatment: Gait belt;Back  brace Nurse Communication: Mobility status;Precautions  Activity Tolerance: Patient tolerated treatment well Patient left: in bed;with family/visitor present;with call bell/phone within reach   Time: [id number] OT Time Calculation (min): 27 min Charges:  OT General Charges $OT Visit: 1 Procedure OT Evaluation $Initial OT Evaluation Tier I: 1 Procedure OT Treatments $Self Care/Home Management : 8-22 mins G-Codes:    Redmond Baseman 2015-10-29, 3:26 PM

## 2015-10-28 NOTE — Evaluation (Signed)
Physical Therapy Evaluation Patient Details Name: Sarah Rodriguez MRN: [id number] DOB: 03-May-1956 Today's Date: 10/28/2015   History of Present Illness  Pt is a 59 y/o female who presents s/p L5-S1 PLIF on 10/27/15.  Clinical Impression  Pt admitted with above diagnosis. Pt currently with functional limitations due to the deficits listed below (see PT Problem List). At the time of PT eval pt was able to perform transfers and ambulation with gross supervision. Pt's main complaint is not being able to get comfortable in a sitting position, which was also her main complaint prior to surgery. Attempted to get pt comfortable with feet supported in bedside chair, however pt states she was not going to be able to stay there for long. Pt will benefit from skilled PT to increase their independence and safety with mobility to allow discharge to the venue listed below.       Follow Up Recommendations Outpatient PT (When appropriate per post-op protocol)    Equipment Recommendations  3in1 (PT)    Recommendations for Other Services       Precautions / Restrictions Precautions Precautions: Fall;Back Precaution Booklet Issued: Yes (comment) Precaution Comments: Pt was educated on back precautions and safe donning/doffing of brace Required Braces or Orthoses: Spinal Brace Spinal Brace: Lumbar corset;Applied in sitting position Restrictions Weight Bearing Restrictions: No      Mobility  Bed Mobility Overal bed mobility: Needs Assistance Bed Mobility: Rolling;Sidelying to Sit Rolling: Supervision Sidelying to sit: Supervision       General bed mobility comments: VC's for mainenance of back precautions.   Transfers Overall transfer level: Needs assistance Equipment used: None Transfers: Sit to/from Stand Sit to Stand: Supervision         General transfer comment: Supervision for safety. VC's for hand placement on seated surface for safety.   Ambulation/Gait Ambulation/Gait  assistance: Supervision Ambulation Distance (Feet): 300 Feet Assistive device: None (Occasional rail use in hall) Gait Pattern/deviations: Step-through pattern;Decreased stride length Gait velocity: Decreased Gait velocity interpretation: Below normal speed for age/gender General Gait Details: Pt cued to walk naturally, as she was initially holding folded hands in at her stomach. Pt was not able to demonstrate an arm swing and often reached for the railings in the hall. Minor imbalances noted but no assist required to recover  Stairs Stairs: Yes Stairs assistance: Min guard Stair Management: One rail Right;Alternating pattern;Step to pattern;Forwards Number of Stairs: 10    Wheelchair Mobility    Modified Rankin (Stroke Patients Only)       Balance Overall balance assessment: Needs assistance Sitting-balance support: Feet supported;No upper extremity supported Sitting balance-Leahy Scale: Fair     Standing balance support: During functional activity;No upper extremity supported Standing balance-Leahy Scale: Fair                               Pertinent Vitals/Pain Pain Assessment: Faces Faces Pain Scale: Hurts little more Pain Location: Back Pain Descriptors / Indicators: Operative site guarding;Discomfort;Grimacing Pain Intervention(s): Limited activity within patient's tolerance;Monitored during session;Repositioned    Home Living Family/patient expects to be discharged to:: Private residence Living Arrangements: Spouse/significant other Available Help at Discharge: Family Type of Home: House Home Access: Stairs to enter   Technical brewer of Steps: 4   Home Equipment: Cane - single point      Prior Function Level of Independence: Independent               Hand Dominance  Extremity/Trunk Assessment   Upper Extremity Assessment: Defer to OT evaluation           Lower Extremity Assessment: Generalized weakness       Cervical / Trunk Assessment: Normal  Communication   Communication: No difficulties  Cognition Arousal/Alertness: Awake/alert Behavior During Therapy: WFL for tasks assessed/performed Overall Cognitive Status: Within Functional Limits for tasks assessed                      General Comments      Exercises        Assessment/Plan    PT Assessment Patient needs continued PT services  PT Diagnosis Difficulty walking;Acute pain   PT Problem List Decreased strength;Decreased range of motion;Decreased activity tolerance;Decreased balance;Decreased mobility;Decreased knowledge of use of DME;Decreased safety awareness;Decreased knowledge of precautions;Pain  PT Treatment Interventions DME instruction;Gait training;Stair training;Functional mobility training;Therapeutic activities;Therapeutic exercise;Neuromuscular re-education;Patient/family education   PT Goals (Current goals can be found in the Care Plan section) Acute Rehab PT Goals Patient Stated Goal: Home, be able to sit PT Goal Formulation: With patient Time For Goal Achievement: 11/04/15 Potential to Achieve Goals: Good    Frequency Min 5X/week   Barriers to discharge        Co-evaluation               End of Session Equipment Utilized During Treatment: Back brace Activity Tolerance: Patient tolerated treatment well Patient left: in chair;with call bell/phone within reach Nurse Communication: Mobility status         Time: 1022-1049 PT Time Calculation (min) (ACUTE ONLY): 27 min   Charges:   PT Evaluation $Initial PT Evaluation Tier I: 1 Procedure PT Treatments $Gait Training: 8-22 mins   PT G Codes:        Rolinda Roan 2015/11/20, 1:06 PM  Rolinda Roan, PT, DPT Acute Rehabilitation Services Pager: [phone]

## 2015-10-28 NOTE — Progress Notes (Signed)
Patient ID: Sarah Rodriguez, female   DOB: 02-Jan-1956, 59 y.o.   MRN: [id number] Vital signs are stable. Motor function is intact in lower extremities. Station and gait is intact. Patient has voided.

## 2015-10-29 MED ORDER — DEXAMETHASONE 1 MG PO TABS: ORAL_TABLET | ORAL | Status: DC

## 2015-10-29 MED ORDER — OXYCODONE-ACETAMINOPHEN 5-325 MG PO TABS: 1.0000 | ORAL_TABLET | ORAL | Status: DC | PRN

## 2015-10-29 MED ORDER — DIAZEPAM 5 MG PO TABS: 5.0000 mg | ORAL_TABLET | Freq: Four times a day (QID) | ORAL | Status: DC | PRN

## 2015-10-29 NOTE — Progress Notes (Signed)
Pt given D/C instructions with Rx's, verbal understanding was provided. Pt's incision is open to air and is clean and dry. Pt's IV was removed prior to D/C. Pt D/C'd home via wheelchair @ 1205 per MD order. Pt is stable @ D/C and has no other needs at this time. Holli Humbles, RN

## 2015-10-29 NOTE — Discharge Instructions (Signed)
Wound Care Leave incision open to air. You may shower. Do not scrub directly on incision.  Do not put any creams, lotions, or ointments on incision. Activity Walk each and every day, increasing distance each day. No lifting greater than 5 lbs.  Avoid excessive neck motion. No driving for 2 weeks; may ride as a passenger locally. Wear neck brace at all times except when showering.  If provided soft collar, may wear for comfort unless otherwise instructed. Diet Resume your normal diet.  Return to Work Will be discussed at you follow up appointment. Call Your Doctor If Any of These Occur Redness, drainage, or swelling at the wound.  Temperature greater than 101 degrees. Severe pain not relieved by pain medication. Increased difficulty swallowing. Incision starts to come apart. Follow Up Appt Call today for appointment in 4 weeks [id number]) or for problems.  If you have any hardware placed in your spine, you will need an x-ray before your appointment.  Spinal Fusion Spinal fusion is a procedure to make 2 or more of the bones in your spinal column (vertebrae) grow together (fuse). This procedure stops movement between the vertebrae and can relieve pain and prevent deformity.  Spinal fusion is used to treat the following conditions:  Fractures of the spine.  Herniated disk (the spongy material [cartilage] between the vertebrae).  Abnormal curvatures of the spine, such as scoliosis or kyphosis.  A weak or an unstable spine, caused by infections or tumor. RISKS AND COMPLICATIONS Complications associated with spinal fusion are rare, but they can occur. Possible complications include:  Bleeding.  Infection near the incision.  Nerve damage. Signs of nerve damage are back pain, pain in one or both legs, weakness, or numbness.  Spinal fluid leakage.  Blood clot in your leg, which can move to your lungs.  Difficulty controlling urination or bowel movements. BEFORE THE PROCEDURE  A  medical evaluation will be done. This will include a physical exam, blood tests, and imaging exams.  You will talk with an anesthesiologist. This is the person who will be in charge of the anesthesia during the procedure. Spinal fusion usually requires that you are asleep during the procedure (general anesthesia).  You will need to stop taking certain medicines, particularly those associated with an increased risk of bleeding. Ask your caregiver about changing or stopping your regular medicines.  If you smoke, you will need to stop at least 2 weeks before the procedure. Smoking can slow down the healing process, especially fusion of the vertebrae, and increase the risk of complications.  Do not eat or drink anything for at least 8 hours before the procedure. PROCEDURE  A cut (incision) is made over the vertebrae that will be fused. The back muscles are separated from the vertebrae. If you are having this procedure to treat a herniated disk, the disc material pressing on the nerve root is removed (decompression). The area where the disk is removed is then filled with extra bone. Bone from another part of your body (autogenous bone) or bone from a bone donor (allograft bone) may be used. The extra bone promotes fusion between the vertebrae. Sometimes, specific medicines are added to the fusion area to promote bone healing. In most cases, screws and rods or metal plates will be used to attach the vertebrae to stabilize them while they fuse.  AFTER THE PROCEDURE   You will stay in a recovery area until the anesthesia has worn off. Your blood pressure and pulse will be checked frequently.  You will be given antibiotics to prevent infection.  You may continue to receive fluids through an intravenous (IV) tube while you are still in the hospital.  Pain after surgery is normal. You will be given pain medicine.  You will be taught how to move correctly and how to stand and walk. While in bed, you will be  instructed to turn frequently, using a "log rolling" technique, in which the entire body is moved without twisting the back.   This information is not intended to replace advice given to you by your health care provider. Make sure you discuss any questions you have with your health care provider.   Document Released: 08/14/2003 Document Revised: 02/07/2012 Document Reviewed: 04/30/2015 Elsevier Interactive Patient Education [employer].

## 2015-10-29 NOTE — Progress Notes (Signed)
Physical Therapy Treatment Patient Details Name: Sarah Rodriguez MRN: [id number] DOB: 03-09-1956 Today's Date: 10/29/2015    History of Present Illness Pt is a 59 y/o female who presents s/p L5-S1 PLIF on 10/27/15.    PT Comments    Pt progressing towards physical therapy goals. Was able to perform transfers and ambulation with gross supervision for safety, and continues to demonstrate some unsteadiness during dynamic activity. Pt does not require assist to recover, however recommended that pt have someone with her during ambulation out of home. Will continue to follow and progress as able per POC.   Follow Up Recommendations  Outpatient PT (When appropriate per post-op protocol)     Equipment Recommendations  3in1 (PT)    Recommendations for Other Services       Precautions / Restrictions Precautions Precautions: Fall;Back Precaution Booklet Issued: Yes (comment) Precaution Comments: Reviewed 3/3 back precautions with pt. Requires cueing to maintain throughout functional mobility.  Required Braces or Orthoses: Spinal Brace Spinal Brace: Lumbar corset;Applied in sitting position Restrictions Weight Bearing Restrictions: No    Mobility  Bed Mobility Overal bed mobility: Needs Assistance Bed Mobility: Rolling;Sidelying to Sit Rolling: Modified independent (Device/Increase time) Sidelying to sit: Supervision       General bed mobility comments: Good demonstration of log roll technique. Supervision for safety and technique as pt elevated trunk to full sitting position.   Transfers Overall transfer level: Needs assistance Equipment used: None Transfers: Sit to/from Stand Sit to Stand: Supervision         General transfer comment: Supervision for safety. VC's for hand placement on seated surface for safety.   Ambulation/Gait Ambulation/Gait assistance: Supervision Ambulation Distance (Feet): 300 Feet Assistive device: None Gait Pattern/deviations: Step-through  pattern;Decreased stride length Gait velocity: Decreased Gait velocity interpretation: Below normal speed for age/gender General Gait Details: Continues to demonstrate mild imbalances during gait training. No assist required to recover.    Stairs Stairs: Yes Stairs assistance: Min guard Stair Management: One rail Right;Forwards Number of Stairs: 10 General stair comments: Close guard for safety.   Wheelchair Mobility    Modified Rankin (Stroke Patients Only)       Balance Overall balance assessment: Needs assistance Sitting-balance support: Feet supported;No upper extremity supported Sitting balance-Leahy Scale: Good     Standing balance support: No upper extremity supported;During functional activity Standing balance-Leahy Scale: Fair                      Cognition Arousal/Alertness: Awake/alert Behavior During Therapy: WFL for tasks assessed/performed Overall Cognitive Status: Within Functional Limits for tasks assessed                      Exercises      General Comments        Pertinent Vitals/Pain Pain Assessment: 0-10 Pain Score: 7  Pain Location: Incision site. Pt had not had pain medicine since last night. Reports she will decline taking meds until after she ate breakfast.  Pain Descriptors / Indicators: Operative site guarding;Discomfort;Grimacing Pain Intervention(s): Limited activity within patient's tolerance;Monitored during session;Repositioned    Home Living                      Prior Function            PT Goals (current goals can now be found in the care plan section) Acute Rehab PT Goals Patient Stated Goal: To go home PT Goal Formulation: With patient Time For Goal  Achievement: 11/04/15 Potential to Achieve Goals: Good Progress towards PT goals: Progressing toward goals    Frequency  Min 5X/week    PT Plan Current plan remains appropriate    Co-evaluation             End of Session Equipment  Utilized During Treatment: Back brace Activity Tolerance: Patient tolerated treatment well Patient left: in chair;with call bell/phone within reach     Time: 0803-0834 PT Time Calculation (min) (ACUTE ONLY): 31 min  Charges:  $Gait Training: 23-37 mins                    G Codes:      Rolinda Roan 11/01/2015, 9:36 AM   Rolinda Roan, PT, DPT Acute Rehabilitation Services Pager: [phone]

## 2015-10-29 NOTE — Progress Notes (Signed)
Patient ID: Sarah Rodriguez, female   DOB: 09-24-1956, 59 y.o.   MRN: [id number] Vital signs are stable Motor function is intact in lower extremities Station and gait is intact Incision is clean and dry Ready for discharge

## 2015-10-29 NOTE — Discharge Summary (Signed)
Physician Discharge Summary  Patient ID: Sarah Rodriguez MRN: [id number] DOB/AGE: [birth year] 58 y.o.  Admit date: 10/27/2015 Discharge date: 10/29/2015  Admission Diagnoses: Spondylolisthesis L5-S1 with lumbar radiculopathy on left  Discharge Diagnoses: Spondylolisthesis L5-S1 with lumbar radiculopathy on left  Active Problems:   Spondylolisthesis at L4-L5 level   Discharged Condition: good  Hospital Course: Patient tolerated surgery well she's been ambulating her incision is clean and dry.  Consults: None  Significant Diagnostic Studies: None  Treatments: surgery: Decompression L5-S1 with posterior lumbar interbody arthrodesis and pedicle screw fixation L5-S1.  Discharge Exam: Blood pressure 118/65, pulse 90, temperature 99.4 F (37.4 C), temperature source Oral, resp. rate 18, height 5\' 1"  (1.549 m), weight 51.71 kg (114 lb), SpO2 97 %. Motor function is intact in lower extremities. Incision is clean and dry.  Disposition: 01-Home or Self Care  Discharge Instructions    Call MD for:  redness, tenderness, or signs of infection (pain, swelling, redness, odor or green/yellow discharge around incision site)    Complete by:  As directed      Call MD for:  severe uncontrolled pain    Complete by:  As directed      Call MD for:  temperature >100.4    Complete by:  As directed      Diet - low sodium heart healthy    Complete by:  As directed      Discharge instructions    Complete by:  As directed   Okay to shower. Do not apply salves or appointments to incision. No heavy lifting with the upper extremities greater than 15 pounds. May resume driving when not requiring pain medication and patient feels comfortable with doing so.     Increase activity slowly    Complete by:  As directed             Medication List    TAKE these medications        ALLEGRA-D 12 HOUR 60-120 MG 12 hr tablet  Generic drug:  fexofenadine-pseudoephedrine  Take 1 tablet by mouth daily.     atorvastatin 10 MG tablet  Commonly known as:  LIPITOR  take 1 tablet by mouth once daily     CINNAMON PO  Take 1,000 Units by mouth 2 (two) times daily.     clonazePAM 0.5 MG tablet  Commonly known as:  KLONOPIN  Take 0.125 mg by mouth 2 (two) times daily as needed. For sleep.     dexamethasone 1 MG tablet  Commonly known as:  DECADRON  2 tablets twice daily for 2 days, one tablet twice daily for 2 days, one tablet daily for 2 days.     diazepam 5 MG tablet  Commonly known as:  VALIUM  Take 1 tablet (5 mg total) by mouth every 6 (six) hours as needed for muscle spasms.     DULoxetine 30 MG capsule  Commonly known as:  CYMBALTA  Take 1 capsule tid.     eletriptan 40 MG tablet  Commonly known as:  RELPAX  Take 1 tablet (40 mg total) by mouth as needed for migraine or headache. One tablet by mouth at onset of headache. May repeat in 2 hours if headache persists or recurs.     enalapril 20 MG tablet  Commonly known as:  VASOTEC  TAKE 1 TABLET BY MOUTH DAILY     fluconazole 150 MG tablet  Commonly known as:  DIFLUCAN  Take 1 tablet (150 mg total) by mouth daily.  fluticasone 50 MCG/ACT nasal spray  Commonly known as:  FLONASE  SHAKE WELL AND USE 2 SPRAYS IN EACH NOSTRIL EVERY EVENING     fluticasone-salmeterol 115-21 MCG/ACT inhaler  Commonly known as:  ADVAIR HFA  Inhale 2 puffs into the lungs as needed.     gabapentin 300 MG capsule  Commonly known as:  NEURONTIN  Take 300 mg by mouth 2 (two) times daily.     levalbuterol 45 MCG/ACT inhaler  Commonly known as:  XOPENEX HFA  Inhale 2 puffs into the lungs every 4 (four) hours as needed for wheezing.     levothyroxine 50 MCG tablet  Commonly known as:  SYNTHROID  Take 1 tablet (50 mcg total) by mouth daily.     Magnesium 250 MG Tabs  Take 1 tablet by mouth daily.     meloxicam 15 MG tablet  Commonly known as:  MOBIC  Take 1 tablet (15 mg total) by mouth daily.     montelukast 10 MG tablet  Commonly known  as:  SINGULAIR  Take 1 tablet (10 mg total) by mouth at bedtime.     nystatin 100000 UNIT/ML suspension  Commonly known as:  MYCOSTATIN  Take 5 mLs (500,000 Units total) by mouth 4 (four) times daily.     oxyCODONE-acetaminophen 5-325 MG tablet  Commonly known as:  PERCOCET/ROXICET  Take 1-2 tablets by mouth every 4 (four) hours as needed for moderate pain.     pantoprazole 40 MG tablet  Commonly known as:  PROTONIX  Take 40 mg by mouth 2 (two) times daily.     predniSONE 20 MG tablet  Commonly known as:  DELTASONE  2 tablets daily for 3 days, 1 tablet daily for 4 days.     promethazine 25 MG tablet  Commonly known as:  PHENERGAN  Take 1 tablet (25 mg total) by mouth every 6 (six) hours as needed for nausea or vomiting.     Vitamin B-12 2500 MCG Subl  Place 1 tablet under the tongue daily.     VITAMIN D PO  Take 4,000 Units by mouth daily.         SignedEarleen Newport 10/29/2015, 8:58 AM

## 2015-11-06 ENCOUNTER — Ambulatory Visit: Payer: Self-pay | Admitting: Internal Medicine

## 2015-11-15 ENCOUNTER — Other Ambulatory Visit: Payer: Self-pay | Admitting: Internal Medicine

## 2015-11-25 ENCOUNTER — Other Ambulatory Visit: Payer: Self-pay | Admitting: Internal Medicine

## 2015-12-01 ENCOUNTER — Other Ambulatory Visit: Payer: Self-pay | Admitting: Internal Medicine

## 2015-12-10 ENCOUNTER — Other Ambulatory Visit: Payer: Self-pay | Admitting: Internal Medicine

## 2015-12-27 ENCOUNTER — Other Ambulatory Visit: Payer: Self-pay | Admitting: Physician Assistant

## 2015-12-30 ENCOUNTER — Ambulatory Visit (INDEPENDENT_AMBULATORY_CARE_PROVIDER_SITE_OTHER): Payer: Medicare Other | Admitting: Internal Medicine

## 2015-12-30 ENCOUNTER — Encounter: Payer: Self-pay | Admitting: Internal Medicine

## 2015-12-30 VITALS — BP 136/78 | HR 76 | Temp 97.3°F | Resp 16 | Ht 62.0 in | Wt 115.7 lb

## 2015-12-30 DIAGNOSIS — E559 Vitamin D deficiency, unspecified: Secondary | ICD-10-CM | POA: Diagnosis not present

## 2015-12-30 DIAGNOSIS — Z79899 Other long term (current) drug therapy: Secondary | ICD-10-CM | POA: Diagnosis not present

## 2015-12-30 DIAGNOSIS — I10 Essential (primary) hypertension: Secondary | ICD-10-CM | POA: Diagnosis not present

## 2015-12-30 DIAGNOSIS — R7303 Prediabetes: Secondary | ICD-10-CM

## 2015-12-30 DIAGNOSIS — E039 Hypothyroidism, unspecified: Secondary | ICD-10-CM

## 2015-12-30 DIAGNOSIS — E785 Hyperlipidemia, unspecified: Secondary | ICD-10-CM | POA: Diagnosis not present

## 2015-12-30 DIAGNOSIS — K219 Gastro-esophageal reflux disease without esophagitis: Secondary | ICD-10-CM | POA: Diagnosis not present

## 2015-12-30 LAB — BASIC METABOLIC PANEL WITH GFR
BUN: 12 mg/dL (ref 7–25)
CO2: 26 mmol/L (ref 20–31)
Calcium: 9.4 mg/dL (ref 8.6–10.4)
Chloride: 106 mmol/L (ref 98–110)
Creat: 0.89 mg/dL (ref 0.50–1.05)
GFR, Est African American: 83 mL/min (ref >=60)
GFR, Est Non African American: 72 mL/min (ref >=60)
Glucose, Bld: 132 mg/dL — ABNORMAL HIGH (ref 65–99)
Potassium: 4.1 mmol/L (ref 3.5–5.3)
Sodium: 139 mmol/L (ref 135–146)

## 2015-12-30 LAB — CBC WITH DIFFERENTIAL/PLATELET
Basophils Absolute: 0.1 10*3/uL (ref 0.0–0.1)
Basophils Relative: 1 % (ref 0–1)
Eosinophils Absolute: 0.4 10*3/uL (ref 0.0–0.7)
Eosinophils Relative: 6 % — ABNORMAL HIGH (ref 0–5)
HCT: 33.7 % — ABNORMAL LOW (ref 36.0–46.0)
Hemoglobin: 10.5 g/dL — ABNORMAL LOW (ref 12.0–15.0)
Lymphocytes Relative: 29 % (ref 12–46)
Lymphs Abs: 2.1 10*3/uL (ref 0.7–4.0)
MCH: 27.4 pg (ref 26.0–34.0)
MCHC: 31.2 g/dL (ref 30.0–36.0)
MCV: 88 fL (ref 78.0–100.0)
MPV: 9.3 fL (ref 8.6–12.4)
Monocytes Absolute: 0.5 10*3/uL (ref 0.1–1.0)
Monocytes Relative: 7 % (ref 3–12)
Neutro Abs: 4 10*3/uL (ref 1.7–7.7)
Neutrophils Relative %: 57 % (ref 43–77)
Platelets: 365 10*3/uL (ref 150–400)
RBC: 3.83 MIL/uL — ABNORMAL LOW (ref 3.87–5.11)
RDW: 14.5 % (ref 11.5–15.5)
WBC: 7.1 10*3/uL (ref 4.0–10.5)

## 2015-12-30 LAB — TSH: TSH: 1.065 u[IU]/mL (ref 0.350–4.500)

## 2015-12-30 LAB — HEPATIC FUNCTION PANEL
ALT: 11 U/L (ref 6–29)
AST: 17 U/L (ref 10–35)
Albumin: 4 g/dL (ref 3.6–5.1)
Alkaline Phosphatase: 66 U/L (ref 33–130)
Bilirubin, Direct: 0.1 mg/dL (ref <=0.2)
Indirect Bilirubin: 0.3 mg/dL (ref 0.2–1.2)
Total Bilirubin: 0.4 mg/dL (ref 0.2–1.2)
Total Protein: 6.7 g/dL (ref 6.1–8.1)

## 2015-12-30 LAB — LIPID PANEL
Cholesterol: 176 mg/dL (ref 125–200)
HDL: 41 mg/dL — ABNORMAL LOW (ref >=46)
LDL Cholesterol: 103 mg/dL (ref <130)
Total CHOL/HDL Ratio: 4.3 Ratio (ref <=5.0)
Triglycerides: 158 mg/dL — ABNORMAL HIGH (ref <150)
VLDL: 32 mg/dL — ABNORMAL HIGH (ref <30)

## 2015-12-30 LAB — MAGNESIUM: Magnesium: 1.8 mg/dL (ref 1.5–2.5)

## 2015-12-30 NOTE — Patient Instructions (Signed)

## 2015-12-30 NOTE — Progress Notes (Signed)
Patient ID: Sarah Rodriguez, female   DOB: Oct 09, 1956, 60 y.o.   MRN: [id number]     This very nice 60 y.o. MWF presents for 6 month follow up with Hypertension, Hyperlipidemia, Pre-Diabetes and Vitamin D Deficiency.      In Sept 2011, patient had Cx Disc surg and radicular sx's and pain recovered. In Sept 2013 she started Lumbar EDSI's by Dr Mina Marble and ultimately in Nov 2013 had L5-S1 Disk surg by Dr Ellene Route and reports rt sciatica pains have persisted. More recently in on Nov 28,2016 she underwent L5-S1 fusion with hardware by Dr Ellene Route and reports improvement with reduction in her analgesic requirement and mostly her pain is controlled with Gabapentin , but is limited by it's making her "loopy".      Patient is treated for HTN since 2007 & BP has been controlled at home. Today's BP: 136/78 mmHg. Patient has had no complaints of any cardiac type chest pain, palpitations, dyspnea/orthopnea/PND, dizziness, claudication, or dependent edema.     Hyperlipidemia since 1997  is near controlled with diet & meds . Last Lipids were near goal with Cholesterol 188; HDL 38*; LDL 106; and sl elevated Triglycerides 219 on 07/29/2015.     Also, the patient has history of PreDiabetes with A1c 5.9% in 2009 and 6.3% in 2014 and has had no symptoms of reactive hypoglycemia, diabetic polys, paresthesias or visual blurring.  Last A1c was 6.0% on 07/29/2015.      Further, the patient also has history of Vitamin D Deficiency of "29" in 2009  and supplements vitamin D without any suspected side-effects. Last vitamin D was still low 48 on 07/29/2015.    Medication Sig  . atorvastatin  10 MG tablet take 1 tablet by mouth once daily  . VITAMIN D Take 4,000 Units by mouth daily.   Marland Kitchen CINNAMON  Take 1,000 Units by mouth 2 (two) times daily.   . clonazePAM  0.5 MG tablet Take 0.125 mg by mouth 2 (two) times daily as needed. For sleep.  Marland Kitchen VITAMIN B-12 2500 MCG SL Place 1 tablet under the tongue daily.   . DULoxetine  30 MG capsule TAKE  1 CAPSULE BY MOUTH THREE TIMES DAILY  . eletriptan (RELPAX) 40 MG tablet Take 1 tab as needed for migraine   . enalapril 20 MG tablet TAKE 1 TABLET BY MOUTH DAILY (Patient taking differently: TAKE 10 MG  BY MOUTH DAILY)  . ALLEGRA-D 12 HR 60-120 MG  Take 1 tablet by mouth daily.  Marland Kitchen FLONASE nasal spray SHAKE WELL AND USE 2 SPRAYS IN EACH NOSTRIL EVERY EVENING  . ADVAIR HFA 115-21 Inhale 2 puffs into the lungs as needed.  . gabapentin (NEURONTIN) 300 MG  Take 300 mg by mouth 3 (three) times daily.   Penne Lash HFA inhaler Inhale 2 puffs into the lungs every 4 (four) hours as needed for wheezing.  Marland Kitchen levothyroxine  50 MCG tablet TAKE 1 TABLET(50 MCG) BY MOUTH DAILY  . Magnesium 250 MG TABS Take 1 tablet by mouth daily.   . meloxicam  15 MG tablet TAKE 1 TABLET(15 MG) BY MOUTH DAILY  . montelukast  10 MG tablet TAKE 1 TABLET(10 MG) BY MOUTH AT BEDTIME  . PERCOCET5-325  Take 1-2 tablets by mouth every 4 (four) hours as needed for moderate pain.  . pantoprazole  40 MG tablet Take 40 mg by mouth 2 (two) times daily.   . promethazine  25 MG tablet Take 1 tablet (25 mg total) by mouth  every 6 (six) hours as needed for nausea or vomiting.  . diazepam (VALIUM) 5 MG tablet Take 1 tablet (5 mg total) by mouth every 6 (six) hours as needed for muscle spasms.   Allergies  Allergen Reactions  . Demerol [Meperidine] Other (See Comments)    REACTION: unknown--itching  . Epinephrine Other (See Comments)    "Sensitivity."  . Niacin And Related Rash   PMHx:   Past Medical History  Diagnosis Date  . History of migraine     takes Relpax daily as needed  . HSV (herpes simplex virus) infection   . Hyperlipidemia     takes Atorvastatin daily  . Hypertension     takes Benicar daily  . Heart murmur   . MVP (mitral valve prolapse)   . Asthma     Dulera daily  . Seasonal allergies     takes Allegra daily  . Shortness of breath     with exertion  . Pneumonia     hx of--been many yrs ago  . Chronic back  pain     HNP  . GERD (gastroesophageal reflux disease)     takes Protonix daily   . Hypothyroidism     takes Synthroid daily  . Depression     takes Clonazepam nightly  . Complication of anesthesia     slow to wake up   Immunization History  Administered Date(s) Administered  . Influenza-Unspecified 09/18/2013  . PPD Test 04/17/2014  . Pneumococcal-Unspecified 11/30/1995  . Td 08/27/2005   Past Surgical History  Procedure Laterality Date  . Dilation and curettage of uterus  2011  . Ablation  2011  . Cholecystectomy  2008  . Tonsillectomy      at age 71  . Buttocks surgery      at age 33  . Cesarean section  1992  . Right knee arthroscopy    . Cervical fusion  03/2010  . Colonoscopy    . Esophagogastroduodenoscopy    . Lumbar laminectomy/decompression microdiscectomy  10/24/2012    Procedure: LUMBAR LAMINECTOMY/DECOMPRESSION MICRODISCECTOMY 1 LEVEL;  Surgeon: Kristeen Miss, MD;  Location: [place];  Service: Neurosurgery;  Laterality: Right;  Right Lumbar five-Sacral One Microdiskectomy   FHx:    Reviewed / unchanged  SHx:    Reviewed / unchanged  Systems Review:  Constitutional: Denies fever, chills, wt changes, headaches, insomnia, fatigue, night sweats, change in appetite. Eyes: Denies redness, blurred vision, diplopia, discharge, itchy, watery eyes.  ENT: Denies discharge, congestion, post nasal drip, epistaxis, sore throat, earache, hearing loss, dental pain, tinnitus, vertigo, sinus pain, snoring.  CV: Denies chest pain, palpitations, irregular heartbeat, syncope, dyspnea, diaphoresis, orthopnea, PND, claudication or edema. Respiratory: denies cough, dyspnea, DOE, pleurisy, hoarseness, laryngitis, wheezing.  Gastrointestinal: Denies dysphagia, odynophagia, heartburn, reflux, water brash, abdominal pain or cramps, nausea, vomiting, bloating, diarrhea, constipation, hematemesis, melena, hematochezia  or hemorrhoids. Genitourinary: Denies dysuria, frequency, urgency,  nocturia, hesitancy, discharge, hematuria or flank pain. Musculoskeletal: Denies arthralgias, myalgias, stiffness, jt. swelling, pain, limping or strain/sprain.  Skin: Denies pruritus, rash, hives, warts, acne, eczema or change in skin lesion(s). Neuro: No weakness, tremor, incoordination, spasms, paresthesia or pain. Psychiatric: Denies confusion, memory loss or sensory loss. Endo: Denies change in weight, skin or hair change.  Heme/Lymph: No excessive bleeding, bruising or enlarged lymph nodes.  Physical Exam  BP 136/78 mmHg  Pulse 76  Temp(Src) 97.3 F (36.3 C)  Resp 16  Ht 5\' 2"  (1.575 m)  Wt 115 lb 11.2 oz (52.481 kg)  BMI 21.16 kg/m2  Appears well nourished and in no distress. Eyes: PERRLA, EOMs, conjunctiva no swelling or erythema. Sinuses: No frontal/maxillary tenderness ENT/Mouth: EAC's clear, TM's nl w/o erythema, bulging. Nares clear w/o erythema, swelling, exudates. Oropharynx clear without erythema or exudates. Oral hygiene is good. Tongue normal, non obstructing. Hearing intact.  Neck: Supple. Thyroid nl. Car 2+/2+ without bruits, nodes or JVD. Chest: Respirations nl with BS clear & equal w/o rales, rhonchi, wheezing or stridor.  Cor: Heart sounds normal w/ regular rate and rhythm without sig. murmurs, gallops, clicks, or rubs. Peripheral pulses normal and equal  without edema.  Abdomen: Soft & bowel sounds normal. Non-tender w/o guarding, rebound, hernias, masses, or organomegaly.  Lymphatics: Unremarkable.  Musculoskeletal: Full ROM all peripheral extremities, joint stability, 5/5 strength, and normal gait.  Skin: Warm, dry without exposed rashes, lesions or ecchymosis apparent.  Neuro: Cranial nerves intact, reflexes equal bilaterally. Sensory-motor testing grossly intact. Tendon reflexes grossly intact.  Pysch: Alert & oriented x 3.  Insight and judgement nl & appropriate. No ideations.  Assessment and Plan:   1. Essential hypertension  - TSH  2.  Hyperlipidemia  - Lipid panel - TSH  3. Prediabetes  - Hemoglobin A1c - Insulin, random  4. Vitamin D deficiency  - VITAMIN D 25 Hydroxy   5. Hypothyroidism, unspecified hypothyroidism type   6. Gastroesophageal reflux disease   7. Medication management  - CBC with Differential/Platelet - BASIC METABOLIC PANEL WITH GFR - Hepatic function panel - Magnesium   Recommended regular exercise, BP monitoring, weight control, and discussed med and SE's. Recommended labs to assess and monitor clinical status. Further disposition pending results of labs. Over 30 minutes of exam, counseling, chart review was performed

## 2015-12-31 ENCOUNTER — Other Ambulatory Visit: Payer: Self-pay | Admitting: Internal Medicine

## 2015-12-31 DIAGNOSIS — D519 Vitamin B12 deficiency anemia, unspecified: Secondary | ICD-10-CM

## 2015-12-31 DIAGNOSIS — D509 Iron deficiency anemia, unspecified: Secondary | ICD-10-CM

## 2015-12-31 LAB — HEMOGLOBIN A1C
Hgb A1c MFr Bld: 5.9 % — ABNORMAL HIGH (ref <5.7)
Mean Plasma Glucose: 123 mg/dL — ABNORMAL HIGH (ref <117)

## 2015-12-31 LAB — INSULIN, RANDOM: Insulin: 44.7 u[IU]/mL — ABNORMAL HIGH (ref 2.0–19.6)

## 2015-12-31 LAB — VITAMIN D 25 HYDROXY (VIT D DEFICIENCY, FRACTURES): Vit D, 25-Hydroxy: 48 ng/mL (ref 30–100)

## 2016-01-08 ENCOUNTER — Other Ambulatory Visit: Payer: Self-pay | Admitting: *Deleted

## 2016-01-08 MED ORDER — FLUCONAZOLE 150 MG PO TABS: ORAL_TABLET | ORAL | Status: DC

## 2016-01-29 ENCOUNTER — Other Ambulatory Visit: Payer: Self-pay | Admitting: Internal Medicine

## 2016-02-16 ENCOUNTER — Encounter: Payer: Self-pay | Admitting: Physician Assistant

## 2016-02-16 ENCOUNTER — Ambulatory Visit (INDEPENDENT_AMBULATORY_CARE_PROVIDER_SITE_OTHER): Payer: Medicare Other | Admitting: Physician Assistant

## 2016-02-16 VITALS — BP 124/86 | HR 88 | Temp 98.1°F | Resp 16 | Ht 62.0 in | Wt 117.6 lb

## 2016-02-16 DIAGNOSIS — I10 Essential (primary) hypertension: Secondary | ICD-10-CM

## 2016-02-16 DIAGNOSIS — J45909 Unspecified asthma, uncomplicated: Secondary | ICD-10-CM

## 2016-02-16 DIAGNOSIS — R7303 Prediabetes: Secondary | ICD-10-CM | POA: Diagnosis not present

## 2016-02-16 DIAGNOSIS — Z0000 Encounter for general adult medical examination without abnormal findings: Secondary | ICD-10-CM | POA: Insufficient documentation

## 2016-02-16 DIAGNOSIS — Z0001 Encounter for general adult medical examination with abnormal findings: Secondary | ICD-10-CM

## 2016-02-16 DIAGNOSIS — M4316 Spondylolisthesis, lumbar region: Secondary | ICD-10-CM

## 2016-02-16 DIAGNOSIS — E785 Hyperlipidemia, unspecified: Secondary | ICD-10-CM

## 2016-02-16 DIAGNOSIS — R6889 Other general symptoms and signs: Secondary | ICD-10-CM

## 2016-02-16 DIAGNOSIS — K219 Gastro-esophageal reflux disease without esophagitis: Secondary | ICD-10-CM | POA: Diagnosis not present

## 2016-02-16 DIAGNOSIS — E559 Vitamin D deficiency, unspecified: Secondary | ICD-10-CM | POA: Diagnosis not present

## 2016-02-16 DIAGNOSIS — E039 Hypothyroidism, unspecified: Secondary | ICD-10-CM | POA: Diagnosis not present

## 2016-02-16 DIAGNOSIS — Z79899 Other long term (current) drug therapy: Secondary | ICD-10-CM | POA: Diagnosis not present

## 2016-02-16 DIAGNOSIS — J0100 Acute maxillary sinusitis, unspecified: Secondary | ICD-10-CM | POA: Diagnosis not present

## 2016-02-16 MED ORDER — AZITHROMYCIN 250 MG PO TABS: ORAL_TABLET | ORAL | Status: AC

## 2016-02-16 MED ORDER — PREDNISONE 20 MG PO TABS: ORAL_TABLET | ORAL | Status: DC

## 2016-02-16 NOTE — Progress Notes (Signed)
MEDICARE ANNUAL WELLNESS VISIT AND FOLLOW UP  Assessment:   1. Essential hypertension - continue medications, DASH diet, exercise and monitor at home. Call if greater than 130/80.   2. Hypothyroidism, unspecified hypothyroidism type Hypothyroidism-check TSH level, continue medications the same, reminded to take on an empty stomach 30-67mins before food.   3. Hyperlipidemia -continue medications, check lipids, decrease fatty foods, increase activity.   4. Prediabetes Decrease carbs  5. Vitamin D deficiency Continue supplement  6. Medication management  7. Asthma, unspecified asthma severity, uncomplicated Continue medications  8. Gastroesophageal reflux disease, esophagitis presence not specified Continue PPI/H2 blocker, diet discussed  9. Spondylolisthesis at L4-L5 level Continue follow up ortho  10. Acute maxillary sinusitis, recurrence not specified - predniSONE (DELTASONE) 20 MG tablet; 2 tablets daily for 3 days, 1 tablet daily for 4 days.  Dispense: 10 tablet; Refill: 0 - azithromycin (ZITHROMAX) 250 MG tablet; Take 2 tablets (500 mg) on  Day 1,  followed by 1 tablet (250 mg) once daily on Days 2 through 5.  Dispense: 6 each; Refill: 1  11. Encounter for Medicare annual wellness exam Schedule MGM, get TDAP next OV  Over 30 minutes of exam, counseling, chart review, and critical decision making was performed  Plan:   During the course of the visit the patient was educated and counseled about appropriate screening and preventive services including:    Pneumococcal vaccine   Influenza vaccine  Td vaccine  Prevnar 13  Screening electrocardiogram  Screening mammography  Bone densitometry screening  Colorectal cancer screening  Diabetes screening  Glaucoma screening  Nutrition counseling   Advanced directives: given info/requested copies  Conditions/risks identified: Diabetes is at goal, ACE/ARB therapy: No, Reason not on Ace Inhibitor/ARB therapy:   PreDM Urinary Incontinence is not an issue: discussed non pharmacology and pharmacology options.  Fall risk: low- discussed PT, home fall assessment, medications.    Subjective:   Sarah Rodriguez is a 60 y.o. female who presents for Medicare Annual Wellness Visit and 3 month follow up on hypertension, prediabetes, hyperlipidemia, vitamin D def.  Date of last medicare wellness visit is unknown.   Her blood pressure has been controlled at home, today their BP is BP: 124/86 mmHg She does workout. She denies chest pain,  dizziness.  She has a history of asthma, presents with 2 weeks of cough, she is on advair, xopenex, flonase, allegra, started on the 6th, with fever, chills, muscle aches, not getting better, sinus congestion, fatigue, sweaty, short of breath, cough with productive mucus.  She is on cholesterol medication and denies myalgias. Her cholesterol is at goal. The cholesterol last visit was:   Lab Results  Component Value Date   CHOL 176 12/30/2015   HDL 41* 12/30/2015   LDLCALC 103 12/30/2015   TRIG 158* 12/30/2015   CHOLHDL 4.3 12/30/2015   She has been working on diet and exercise for prediabetes, and denies paresthesia of the feet, polydipsia, polyuria and visual disturbances. Last A1C in the office was:  Lab Results  Component Value Date   HGBA1C 5.9* 12/30/2015   Last GFR   Lab Results  Component Value Date   GFRNONAA 72 12/30/2015  Patient is on Vitamin D supplement. Lab Results  Component Value Date   VD25OH 48 12/30/2015     She is on thyroid medication. Her medication was not changed last visit.   Lab Results  Component Value Date   TSH 1.065 12/30/2015  .  She is on pristiq for depression, in remission,  on klonopin for sleep.  Follows with Dr. Lenna Gilford for RAversus OA.  Follows with Dr. Ellene Route for her back.   Medication Review Current Outpatient Prescriptions on File Prior to Visit  Medication Sig Dispense Refill  . atorvastatin (LIPITOR) 10 MG tablet  take 1 tablet by mouth once daily 30 tablet 5  . Cholecalciferol (VITAMIN D PO) Take 4,000 Units by mouth daily.     Marland Kitchen CINNAMON PO Take 1,000 Units by mouth 2 (two) times daily.     . clonazePAM (KLONOPIN) 0.5 MG tablet Take 0.125 mg by mouth 2 (two) times daily as needed. For sleep.    . Cyanocobalamin (VITAMIN B-12) 2500 MCG SUBL Place 1 tablet under the tongue daily.     . DULoxetine (CYMBALTA) 30 MG capsule TAKE 1 CAPSULE BY MOUTH THREE TIMES DAILY 270 capsule 1  . eletriptan (RELPAX) 40 MG tablet Take 1 tablet (40 mg total) by mouth as needed for migraine or headache. One tablet by mouth at onset of headache. May repeat in 2 hours if headache persists or recurs. 24 tablet 11  . enalapril (VASOTEC) 20 MG tablet TAKE 1 TABLET BY MOUTH DAILY (Patient taking differently: TAKE 10 MG  BY MOUTH DAILY) 30 tablet 3  . fexofenadine-pseudoephedrine (ALLEGRA-D 12 HOUR) 60-120 MG per tablet Take 1 tablet by mouth daily.    . fluconazole (DIFLUCAN) 150 MG tablet Take 1 tablet weekly for yeast infection 4 tablet 3  . fluticasone (FLONASE) 50 MCG/ACT nasal spray SHAKE WELL AND USE 2 SPRAYS IN EACH NOSTRIL EVERY EVENING 16 g 0  . fluticasone-salmeterol (ADVAIR HFA) 115-21 MCG/ACT inhaler Inhale 2 puffs into the lungs as needed. 1 Inhaler 3  . gabapentin (NEURONTIN) 300 MG capsule Take 300 mg by mouth 3 (three) times daily.   0  . levalbuterol (XOPENEX HFA) 45 MCG/ACT inhaler Inhale 2 puffs into the lungs every 4 (four) hours as needed for wheezing.    Marland Kitchen levothyroxine (SYNTHROID, LEVOTHROID) 50 MCG tablet TAKE 1 TABLET(50 MCG) BY MOUTH DAILY 90 tablet 1  . Magnesium 250 MG TABS Take 1 tablet by mouth daily.     . meloxicam (MOBIC) 15 MG tablet TAKE 1 TABLET(15 MG) BY MOUTH DAILY 90 tablet 0  . montelukast (SINGULAIR) 10 MG tablet TAKE 1 TABLET(10 MG) BY MOUTH AT BEDTIME 90 tablet 1  . nystatin (MYCOSTATIN) 100000 UNIT/ML suspension Take 5 mLs (500,000 Units total) by mouth 4 (four) times daily. (Patient taking  differently: Take 5 mLs by mouth 4 (four) times daily as needed. ) 120 mL 0  . pantoprazole (PROTONIX) 40 MG tablet Take 40 mg by mouth 2 (two) times daily.     . promethazine (PHENERGAN) 25 MG tablet Take 1 tablet (25 mg total) by mouth every 6 (six) hours as needed for nausea or vomiting. 30 tablet 2   No current facility-administered medications on file prior to visit.    Current Problems (verified) Patient Active Problem List   Diagnosis Date Noted  . Encounter for Medicare annual wellness exam 02/16/2016  . Spondylolisthesis at L4-L5 level 10/27/2015  . Prediabetes 12/12/2014  . Vitamin D deficiency 04/17/2014  . Medication management 04/17/2014  . Hyperlipidemia   . Hypertension   . Asthma   . GERD (gastroesophageal reflux disease)   . Hypothyroidism     Screening Tests Immunization History  Administered Date(s) Administered  . Influenza-Unspecified 09/18/2013  . PPD Test 04/17/2014  . Pneumococcal-Unspecified 11/30/1995  . Td 08/27/2005    Preventative care: Last colonoscopy: up  to date, unknown Last mammogram: 09/2014 DUE Last pap smear/pelvic exam: 2012 declines another  DEXA: declines Stress test 2006 EGD 2014, Dr. Cristina Gong  Prior vaccinations: TD or Tdap: 2006 DUE will get next year Influenza: 2014 did not get this year  Pneumococcal: 1997 Prevnar13: age 51 Shingles/Zostavax: declines  Names of Other Physician/Practitioners you currently use: 1. [facility]- here for primary care 2. Dr. Georgina Snell, eye doctor, last visit 09/2015, wears glasses.  Patient Care Team: Unk Pinto, MD as PCP - General (Internal Medicine)  Past Surgical History  Procedure Laterality Date  . Dilation and curettage of uterus  2011  . Ablation  2011  . Cholecystectomy  2008  . Tonsillectomy      at age 42  . Buttocks surgery      at age 14  . Cesarean section  1992  . Right knee arthroscopy    . Cervical fusion  03/2010  .  Colonoscopy    . Esophagogastroduodenoscopy    . Lumbar laminectomy/decompression microdiscectomy  10/24/2012    Procedure: LUMBAR LAMINECTOMY/DECOMPRESSION MICRODISCECTOMY 1 LEVEL;  Surgeon: Kristeen Miss, MD;  Location: [place] [facility];  Service: Neurosurgery;  Laterality: Right;  Right Lumbar five-Sacral One Microdiskectomy   Family History  Problem Relation Age of Onset  . Arthritis Mother   . Heart attack Mother   . Depression Mother   . Allergies Mother   . Hypertension Mother   . Heart attack Father    Social History  Substance Use Topics  . Smoking status: Former Research scientist (life sciences)  . Smokeless tobacco: None     Comment: quit at age 55  . Alcohol Use: No     Comment: 2-3 times a yr    MEDICARE WELLNESS OBJECTIVES: Tobacco use: She does not smoke.  Patient is a former smoker. If yes, counseling given Alcohol Current alcohol use: none Osteoporosis: postmenopausal estrogen deficiency and dietary calcium and/or vitamin D deficiency, History of fracture in the past year: no Diet: in general, a "healthy" diet   Physical activity: Current Exercise Habits: Home exercise routine, Type of exercise: walking, Time (Minutes): 20, Frequency (Times/Week): 4, Weekly Exercise (Minutes/Week): 80, Intensity: Mild Cardiac risk factors: Cardiac Risk Factors include: dyslipidemia;hypertension Depression/mood screen:   Depression screen [facility] 2/9 02/16/2016  Decreased Interest 0  Down, Depressed, Hopeless 0  PHQ - 2 Score 0    ADLs:  In your present state of health, do you have any difficulty performing the following activities: 02/16/2016 12/30/2015  Hearing? N Y  Vision? N N  Difficulty concentrating or making decisions? N N  Walking or climbing stairs? N N  Dressing or bathing? N N  Doing errands, shopping? N N  Preparing Food and eating ? N -  Using the Toilet? N -  In the past six months, have you accidently leaked urine? N -  Do you have problems with loss of bowel control? N -  Managing your  Medications? N -  Managing your Finances? N -  Housekeeping or managing your Housekeeping? N -     Cognitive Testing  Alert? Yes  Normal Appearance?Yes  Oriented to person? Yes  Place? Yes   Time? Yes  Recall of three objects?  Yes  Can perform simple calculations? Yes  Displays appropriate judgment?Yes  Can read the correct time from a watch face?Yes  EOL planning: Does patient have an advance directive?: Yes Type of Advance Directive: [place] will Does patient want to make changes to  advanced directive?: No - Patient declined Copy of advanced directive(s) in chart?: No - copy requested   Objective:   Today's Vitals   02/16/16 1434  BP: 124/86  Pulse: 88  Temp: 98.1 F (36.7 C)  TempSrc: Temporal  Resp: 16  Height: 5\' 2"  (1.575 m)  Weight: 117 lb 9.6 oz (53.343 kg)  SpO2: 98%   Body mass index is 21.5 kg/(m^2).  General appearance: alert, no distress, WD/WN,  female HEENT: normocephalic, sclerae anicteric, TMs pearly, + bilateral maxiallry sinus tenderness right worse than left nares patent, no discharge or erythema, pharynx normal Oral cavity: MMM, no lesions Neck: supple, no lymphadenopathy, no thyromegaly, no masses Heart: RRR, normal S1, S2, no murmurs Lungs: CTA bilaterally, no wheezes, rhonchi, or rales Abdomen: +bs, soft, non tender, non distended, no masses, no hepatomegaly, no splenomegaly Musculoskeletal: nontender, no swelling, no obvious deformity Extremities: no edema, no cyanosis, no clubbing Pulses: 2+ symmetric, upper and lower extremities, normal cap refill Neurological: alert, oriented x 3, CN2-12 intact, strength normal upper extremities and lower extremities, sensation normal throughout, DTRs 2+ throughout, no cerebellar signs, gait normal Psychiatric: normal affect, behavior normal, pleasant  Breast: defer Gyn: defer Rectal: defer   Medicare Attestation I have personally reviewed: The patient's medical and social  history Their use of alcohol, tobacco or illicit drugs Their current medications and supplements The patient's functional ability including ADLs,fall risks, home safety risks, cognitive, and hearing and visual impairment Diet and physical activities Evidence for depression or mood disorders  The patient's weight, height, BMI, and visual acuity have been recorded in the chart.  I have made referrals, counseling, and provided education to the patient based on review of the above and I have provided the patient with a written personalized care plan for preventive services.     Vicie Mutters, PA-C   02/16/2016

## 2016-02-16 NOTE — Patient Instructions (Signed)
Please take the prednisone to help decrease inflammation and therefore decrease symptoms. Take it it with food to avoid GI upset. It can cause increased energy but on the other hand it can make it hard to sleep at night so please take it AT NIGHT WITH DINNER, it takes 8-12 hours to start working so it will NOT affect your sleeping if you take it at night with your food!!  If you are diabetic it will increase your sugars so decrease carbs and monitor your sugars closely.    HOW TO TREAT VIRAL COUGH AND COLD SYMPTOMS:  -Symptoms usually last at least 1 week with the worst symptoms being around day 4.  - colds usually start with a sore throat and end with a cough, and the cough can take 2 weeks to get better.  -No antibiotics are needed for colds, flu, sore throats, cough, bronchitis UNLESS symptoms are longer than 7 days OR if you are getting better then get drastically worse.  -There are a lot of combination medications (Dayquil, Nyquil, Vicks 44, tyelnol cold and sinus, ETC). Please look at the ingredients on the back so that you are treating the correct symptoms and not doubling up on medications/ingredients.    Medicines you can use  Nasal congestion  - pseudoephedrine (Sudafed)- behind the counter, do not use if you have high blood pressure, medicine that have -D in them.  - phenylephrine (Sudafed PE) -Dextormethorphan + chlorpheniramine (Coridcidin HBP)- okay if you have high blood pressure -Oxymetazoline (Afrin) nasal spray- LIMIT to 3 days -Saline nasal spray -Neti pot (used distilled or bottled water)  Ear pain/congestion  -pseudoephedrine (sudafed) - Nasonex/flonase nasal spray  Fever  -Acetaminophen (Tyelnol) -Ibuprofen (Advil, motrin, aleve)  Sore Throat  -Acetaminophen (Tyelnol) -Ibuprofen (Advil, motrin, aleve) -Drink a lot of water -Gargle with salt water - Rest your voice (don't talk) -Throat sprays -Cough drops  Body Aches  -Acetaminophen (Tyelnol) -Ibuprofen  (Advil, motrin, aleve)  Headache  -Acetaminophen (Tyelnol) -Ibuprofen (Advil, motrin, aleve) - Exedrin, Exedrin Migraine  Allergy symptoms (cough, sneeze, runny nose, itchy eyes) -Claritin or loratadine cheapest but likely the weakest  -Zyrtec or certizine at night because it can make you sleepy -The strongest is allegra or fexafinadine  Cheapest at [employer], [employer], [employer]  Cough  -Dextromethorphan (Delsym)- medicine that has DM in it -Guafenesin (Mucinex/Robitussin) - cough drops - drink lots of water  Chest Congestion  -Guafenesin (Mucinex/Robitussin)  Red Itchy Eyes  - Naphcon-A  Upset Stomach  - Bland diet (nothing spicy, greasy, fried, and high acid foods like tomatoes, oranges, berries) -OKAY- cereal, bread, soup, crackers, rice -Eat smaller more frequent meals -reduce caffeine, no alcohol -Loperamide (Imodium-AD) if diarrhea -Prevacid for heart burn  General health when sick  -Hydration -wash your hands frequently -keep surfaces clean -change pillow cases and sheets often -Get fresh air but do not exercise strenuously -Vitamin D, double up on it - Vitamin C -Zinc       

## 2016-02-24 ENCOUNTER — Other Ambulatory Visit: Payer: Self-pay | Admitting: Internal Medicine

## 2016-02-25 ENCOUNTER — Other Ambulatory Visit: Payer: Self-pay

## 2016-02-25 ENCOUNTER — Other Ambulatory Visit: Payer: Self-pay | Admitting: Internal Medicine

## 2016-02-25 DIAGNOSIS — J4520 Mild intermittent asthma, uncomplicated: Secondary | ICD-10-CM

## 2016-02-25 MED ORDER — FLUTICASONE-SALMETEROL 115-21 MCG/ACT IN AERO: INHALATION_SPRAY | RESPIRATORY_TRACT | Status: DC

## 2016-02-25 MED ORDER — FLUTICASONE-SALMETEROL 115-21 MCG/ACT IN AERO: 2.0000 | INHALATION_SPRAY | RESPIRATORY_TRACT | Status: DC | PRN

## 2016-03-02 ENCOUNTER — Other Ambulatory Visit: Payer: Self-pay | Admitting: *Deleted

## 2016-03-02 MED ORDER — ELETRIPTAN HYDROBROMIDE 40 MG PO TABS: 40.0000 mg | ORAL_TABLET | ORAL | Status: DC | PRN

## 2016-03-03 ENCOUNTER — Encounter: Payer: Self-pay | Admitting: *Deleted

## 2016-03-03 DIAGNOSIS — G43909 Migraine, unspecified, not intractable, without status migrainosus: Secondary | ICD-10-CM | POA: Insufficient documentation

## 2016-03-09 ENCOUNTER — Other Ambulatory Visit: Payer: Self-pay | Admitting: Internal Medicine

## 2016-03-14 ENCOUNTER — Other Ambulatory Visit: Payer: Self-pay | Admitting: Internal Medicine

## 2016-03-18 ENCOUNTER — Encounter: Payer: Self-pay | Admitting: Physician Assistant

## 2016-04-22 ENCOUNTER — Ambulatory Visit (INDEPENDENT_AMBULATORY_CARE_PROVIDER_SITE_OTHER): Payer: Medicare Other | Admitting: Internal Medicine

## 2016-04-22 ENCOUNTER — Encounter: Payer: Self-pay | Admitting: Internal Medicine

## 2016-04-22 ENCOUNTER — Other Ambulatory Visit: Payer: Self-pay | Admitting: Internal Medicine

## 2016-04-22 ENCOUNTER — Other Ambulatory Visit: Payer: Self-pay | Admitting: *Deleted

## 2016-04-22 VITALS — BP 132/78 | HR 80 | Temp 97.6°F | Resp 16 | Ht 62.0 in | Wt 116.0 lb

## 2016-04-22 DIAGNOSIS — J4520 Mild intermittent asthma, uncomplicated: Secondary | ICD-10-CM

## 2016-04-22 DIAGNOSIS — J0410 Acute tracheitis without obstruction: Secondary | ICD-10-CM | POA: Diagnosis not present

## 2016-04-22 DIAGNOSIS — J0100 Acute maxillary sinusitis, unspecified: Secondary | ICD-10-CM

## 2016-04-22 MED ORDER — BENZONATATE 200 MG PO CAPS: ORAL_CAPSULE | ORAL | Status: DC

## 2016-04-22 MED ORDER — FLUTICASONE-SALMETEROL 115-21 MCG/ACT IN AERO: INHALATION_SPRAY | RESPIRATORY_TRACT | Status: DC

## 2016-04-22 MED ORDER — AZITHROMYCIN 250 MG PO TABS
ORAL_TABLET | ORAL | Status: DC
Start: 2016-04-22 — End: 2016-05-05

## 2016-04-22 MED ORDER — LEVALBUTEROL TARTRATE 45 MCG/ACT IN AERO: 2.0000 | INHALATION_SPRAY | RESPIRATORY_TRACT | Status: DC | PRN

## 2016-04-22 MED ORDER — PREDNISONE 20 MG PO TABS
ORAL_TABLET | ORAL | Status: DC
Start: 2016-04-22 — End: 2016-05-05

## 2016-04-22 NOTE — Patient Instructions (Signed)
Mucus relief 400 mg -   Take 2 tablets 3 x /day with meals

## 2016-04-25 NOTE — Progress Notes (Signed)
Subjective:    Patient ID: Sarah Rodriguez, female    DOB: [birth year], 60 y.o.   MRN: [id number]  HPI This nice 60 yo MWF w/hx/o allergies & asthma presented with head & chest congestion, cough productive of a yellow green sputum & R earache. Denies fever, chills, sweats, dyspnea or rash.   Medication Sig  . atorvastatin 10 MG tablet TAKE 1 TABLET BY MOUTH EVERY DAY  . VITAMIN D  Take 4,000 Units by mouth daily.   Marland Kitchen CINNAMON  Take 1,000 Units by mouth 2 (two) times daily.   . clonazePAM  0.5 MG tablet Take 0.125 mg by mouth 2 (two) times daily as needed. For sleep.  Marland Kitchen VIT B-12 2500 MCG SUBL Place 1 tablet under the tongue daily.   . DULoxetine  30 MG capsule TAKE 1 CAPSULE BY MOUTH THREE TIMES DAILY  . RELPAX 40 MG tablet Take 1 tablet (40 mg total) by mouth as needed for migraine  . enalapril  20 MG  TAKE 1 TABLET BY MOUTH DAILY  . ALLEGRA-D 12 HR60-120 MG  Take 1 tablet by mouth daily.  . fluconazole  150 MG tablet Take 1 tablet weekly for yeast infection  . FLONASE  nasal spray SHAKE WELL AND USE 2 SPRAYS IN EACH NOSTRIL EVERY EVENING  . gabapentin  300 MG capsule Take 300 mg by mouth 3 (three) times daily.   Marland Kitchen levothyroxine  50 MCG tablet TAKE 1 TABLET(50 MCG) BY MOUTH DAILY  . Magnesium 250 MG TABS Take 2 tablets by mouth daily.   . meloxicam 15 MG tablet TAKE 1 TABLET(15 MG) BY MOUTH DAILY  . montelukast  10 MG tablet TAKE 1 TABLET(10 MG) BY MOUTH AT BEDTIME  . pantoprazole (40 MG tablet Take 40 mg by mouth 2 (two) times daily.   . promethazine  25 MG tablet Take 1 tablet (25 mg total) by mouth every 6 (six) hours as needed   . ADVAIR HFA 115-21 inhaler 2 inhalations 10-15 minutes apart every 12 hours  . levalbuterol (XOPENEX HFA) 45  inhaler Inhale 2 puffs into the lungs every 4 (four) hours as needed for wheezing.   Allergies  Allergen Reactions  . Demerol [Meperidine] Other (See Comments)    REACTION: unknown--itching  . Epinephrine Other (See Comments)    "Sensitivity."  .  Niacin And Related Rash   Past Medical History  Diagnosis Date  . History of migraine     takes Relpax daily as needed  . HSV (herpes simplex virus) infection   . Hyperlipidemia     takes Atorvastatin daily  . Hypertension     takes Benicar daily  . Heart murmur   . MVP (mitral valve prolapse)   . Asthma     Dulera daily  . Seasonal allergies     takes Allegra daily  . Shortness of breath     with exertion  . Pneumonia     hx of--been many yrs ago  . Chronic back pain     HNP  . GERD (gastroesophageal reflux disease)     takes Protonix daily   . Hypothyroidism     takes Synthroid daily  . Depression     takes Clonazepam nightly  . Complication of anesthesia     slow to wake up   Past Surgical History  Procedure Laterality Date  . Dilation and curettage of uterus  2011  . Ablation  2011  . Cholecystectomy  2008  . Tonsillectomy  at age 73  . Buttocks surgery      at age 89  . Cesarean section  1992  . Right knee arthroscopy    . Cervical fusion  03/2010  . Colonoscopy    . Esophagogastroduodenoscopy    . Lumbar laminectomy/decompression microdiscectomy  10/24/2012    Procedure: LUMBAR LAMINECTOMY/DECOMPRESSION MICRODISCECTOMY 1 LEVEL;  Surgeon: Kristeen Miss, MD;  Location: [place] [facility];  Service: Neurosurgery;  Laterality: Right;  Right Lumbar five-Sacral One Microdiskectomy   Review of Systems  10 point systems review negative except as above.    Objective:   Physical Exam  BP 132/78 mmHg  Pulse 80  Temp(Src) 97.6 F (36.4 C)  Resp 16  Ht 5\' 2"  (1.575 m)  Wt 116 lb (52.617 kg)  BMI 21.21 kg/m2  HEENT - Eac's patent. TM's retracted R>L w/Nl color. EOM's full. PERRLA. (+) tender bilat maxillary areasNasoOroPharynx clear. Neck - supple. Nl Thyroid. Carotids 2+ & No bruits, nodes, JVD Chest - Scattered rales & rhonchi -  No wheezes. Cor - Nl HS. RRR w/o sig M. Neuro - Nl w/o focal abnormalities.    Assessment & Plan:   1. Tracheitis  - predniSONE  (DELTASONE) 20 MG tablet; 1 tab 3 x day for 3 days, then 1 tab 2 x day for 3 days, then 1 tab 1 x day for 5 days  Dispense: 20 tablet; Refill: 0 - azithromycin (ZITHROMAX) 250 MG tablet; Take 2 tablets (500 mg) on  Day 1,  followed by 1 tablet (250 mg) once daily on Days 2 through 5.  Dispense: 6 each; Refill: 1 - benzonatate (TESSALON) 200 MG capsule; Take 1 perle   3 x / day to prevent cough  Dispense: 30 capsule; Refill: 1  2. Acute maxillary sinusitis, recurrence not specified  - azithromycin (ZITHROMAX) 250 MG tablet; Take 2 tablets (500 mg) on  Day 1,  followed by 1 tablet (250 mg) once daily on Days 2 through 5.  Dispense: 6 each; Refill: 1

## 2016-05-05 ENCOUNTER — Encounter: Payer: Self-pay | Admitting: Internal Medicine

## 2016-05-05 ENCOUNTER — Ambulatory Visit (INDEPENDENT_AMBULATORY_CARE_PROVIDER_SITE_OTHER): Payer: Medicare Other | Admitting: Internal Medicine

## 2016-05-05 VITALS — BP 140/76 | HR 84 | Temp 97.2°F | Resp 16 | Ht 62.0 in | Wt 117.2 lb

## 2016-05-05 DIAGNOSIS — Z136 Encounter for screening for cardiovascular disorders: Secondary | ICD-10-CM

## 2016-05-05 DIAGNOSIS — E785 Hyperlipidemia, unspecified: Secondary | ICD-10-CM

## 2016-05-05 DIAGNOSIS — Z79899 Other long term (current) drug therapy: Secondary | ICD-10-CM

## 2016-05-05 DIAGNOSIS — G43109 Migraine with aura, not intractable, without status migrainosus: Secondary | ICD-10-CM

## 2016-05-05 DIAGNOSIS — Z0000 Encounter for general adult medical examination without abnormal findings: Secondary | ICD-10-CM | POA: Diagnosis not present

## 2016-05-05 DIAGNOSIS — Z23 Encounter for immunization: Secondary | ICD-10-CM | POA: Diagnosis not present

## 2016-05-05 DIAGNOSIS — J0410 Acute tracheitis without obstruction: Secondary | ICD-10-CM

## 2016-05-05 DIAGNOSIS — Z0001 Encounter for general adult medical examination with abnormal findings: Secondary | ICD-10-CM

## 2016-05-05 DIAGNOSIS — R7303 Prediabetes: Secondary | ICD-10-CM

## 2016-05-05 DIAGNOSIS — K219 Gastro-esophageal reflux disease without esophagitis: Secondary | ICD-10-CM

## 2016-05-05 DIAGNOSIS — E039 Hypothyroidism, unspecified: Secondary | ICD-10-CM

## 2016-05-05 DIAGNOSIS — I10 Essential (primary) hypertension: Secondary | ICD-10-CM | POA: Diagnosis not present

## 2016-05-05 DIAGNOSIS — Z1212 Encounter for screening for malignant neoplasm of rectum: Secondary | ICD-10-CM

## 2016-05-05 DIAGNOSIS — E559 Vitamin D deficiency, unspecified: Secondary | ICD-10-CM

## 2016-05-05 LAB — HEPATIC FUNCTION PANEL
ALT: 13 U/L (ref 6–29)
AST: 16 U/L (ref 10–35)
Albumin: 4.3 g/dL (ref 3.6–5.1)
Alkaline Phosphatase: 66 U/L (ref 33–130)
Bilirubin, Direct: 0.1 mg/dL (ref <=0.2)
Indirect Bilirubin: 0.3 mg/dL (ref 0.2–1.2)
Total Bilirubin: 0.4 mg/dL (ref 0.2–1.2)
Total Protein: 6.4 g/dL (ref 6.1–8.1)

## 2016-05-05 LAB — BASIC METABOLIC PANEL WITH GFR
BUN: 18 mg/dL (ref 7–25)
CO2: 22 mmol/L (ref 20–31)
Calcium: 8.9 mg/dL (ref 8.6–10.4)
Chloride: 105 mmol/L (ref 98–110)
Creat: 0.87 mg/dL (ref 0.50–1.05)
GFR, Est African American: 84 mL/min (ref >=60)
GFR, Est Non African American: 73 mL/min (ref >=60)
Glucose, Bld: 93 mg/dL (ref 65–99)
Potassium: 4.2 mmol/L (ref 3.5–5.3)
Sodium: 142 mmol/L (ref 135–146)

## 2016-05-05 LAB — CBC WITH DIFFERENTIAL/PLATELET
Basophils Absolute: 0 {cells}/uL (ref 0–200)
Basophils Relative: 0 %
Eosinophils Absolute: 168 {cells}/uL (ref 15–500)
Eosinophils Relative: 2 %
HCT: 33.3 % — ABNORMAL LOW (ref 35.0–45.0)
Hemoglobin: 10.2 g/dL — ABNORMAL LOW (ref 11.7–15.5)
Lymphocytes Relative: 24 %
Lymphs Abs: 2016 {cells}/uL (ref 850–3900)
MCH: 24.9 pg — ABNORMAL LOW (ref 27.0–33.0)
MCHC: 30.6 g/dL — ABNORMAL LOW (ref 32.0–36.0)
MCV: 81.4 fL (ref 80.0–100.0)
MPV: 9.5 fL (ref 7.5–12.5)
Monocytes Absolute: 672 {cells}/uL (ref 200–950)
Monocytes Relative: 8 %
Neutro Abs: 5544 {cells}/uL (ref 1500–7800)
Neutrophils Relative %: 66 %
Platelets: 364 K/uL (ref 140–400)
RBC: 4.09 MIL/uL (ref 3.80–5.10)
RDW: 16.7 % — ABNORMAL HIGH (ref 11.0–15.0)
WBC: 8.4 K/uL (ref 3.8–10.8)

## 2016-05-05 LAB — HEMOGLOBIN A1C
Hgb A1c MFr Bld: 6 % — ABNORMAL HIGH (ref <5.7)
Mean Plasma Glucose: 126 mg/dL

## 2016-05-05 LAB — MAGNESIUM: Magnesium: 2 mg/dL (ref 1.5–2.5)

## 2016-05-05 LAB — LIPID PANEL
Cholesterol: 174 mg/dL (ref 125–200)
HDL: 44 mg/dL — ABNORMAL LOW (ref >=46)
LDL Cholesterol: 96 mg/dL (ref <130)
Total CHOL/HDL Ratio: 4 Ratio (ref <=5.0)
Triglycerides: 172 mg/dL — ABNORMAL HIGH (ref <150)
VLDL: 34 mg/dL — ABNORMAL HIGH (ref <30)

## 2016-05-05 LAB — TSH: TSH: 0.61 m[IU]/L

## 2016-05-05 MED ORDER — BENZONATATE 200 MG PO CAPS: ORAL_CAPSULE | ORAL | Status: AC

## 2016-05-05 MED ORDER — LEVOFLOXACIN 500 MG PO TABS: ORAL_TABLET | ORAL | Status: DC

## 2016-05-05 NOTE — Progress Notes (Signed)
Patient ID: Sarah Rodriguez, female   DOB: 02-21-1956, 60 y.o.   MRN: [id number]  [facility]                   Unk Pinto, M.D.    Uvaldo Bristle. Silverio Lay, P.A.-C      Starlyn Skeans, P.A.-C   [facility]                [address] [place], [place] [id number] Telephone [phone] Telefax [phone]  ______________________________________________________________________  Annual Screening/Preventative Visit And Comprehensive Evaluation &  Examination     This very nice 60 y.o.female presents for a Wellness/Preventative Visit & comprehensive evaluation and management of multiple medical co-morbidities.  Patient has been followed for HTN, Prediabetes, Hyperlipidemia and Vitamin D Deficiency. Patient relates Chronic pain syndrome hx/o Cx HNP surg in 2011 with excellent recovery and resolution of pain and then in 2009 she developed Lumbago/R sciatica and received EDSI in 07/2012 by Dr Mina Marble and finally in Nov 2016 had L5-S1 surgery by Dr Ellene Route. Patient also has hx/o GERD with sx's controlled with her meds.       HTN predates since 2007. Patient's BP has been controlled at home and patient denies any cardiac symptoms as chest pain, palpitations, shortness of breath, dizziness or ankle swelling. Today's BP: 140/76 mmHg      Patient's hyperlipidemia predates circa 1997 and  is controlled with diet and medications. Patient denies myalgias or other medication SE's. Last lipids were  Near goal with Cholesterol 176; HDL 41*; LDL 103; Triglycerides 158 on 12/30/2015.     Patient has prediabetes predating since 2009 with A1c 5.9% and patient denies reactive hypoglycemic symptoms, visual blurring, diabetic polys, or paresthesias. Last A1c was 5.9% on 12/30/2015.     Patient has been on Thyroid replacement since 2009. Finally, patient has history of Vitamin D Deficiency  Of "25" in 2009 and last Vitamin D was 48 on  12/30/2015.  Medication Sig  . atorvastatin  10 MG tablet TAKE 1 TABLET BY MOUTH EVERY DAY  . Vit D  Take 4,000 Units by mouth daily.   . clonazePAM  0.5 MG tablet Take 0.125 mg by mouth 2 (two) times daily as needed. For sleep.  Marland Kitchen VITB-12 2500 MCG SUBL Place 1 tablet under the tongue daily.   . DULoxetine  30 MG capsule TAKE 1 CAPSULE BY MOUTH THREE TIMES DAILY  . eletriptan  40 MG tablet Take 1 tablet (40 mg total) by mouth as needed for migraine or headache. One tablet by mouth at onset of headache. May repeat in 2 hours if headache persists or recurs.  . enalapril20 MG tablet TAKE 1 TABLET BY MOUTH DAILY  . fexofenadine-pseudoephedrine (ALLEGRA-D 12 HR) 60-120  tablet Take 1 tablet by mouth daily.  . fluconazole (DIFLUCAN) 150 MG tablet Take 1 tablet weekly for yeast infection  . FLONASE nasal spray SHAKE WELL AND USE 2 SPRAYS IN EACH NOSTRIL EVERY EVENING  . ADVAIR HFA 115-21 MCG/ACT inhaler 2 inhalations 10-15 minutes apart every 12 hours  . gabapentin 300 MG capsule Take 300 mg by mouth 3 (three) times daily.   Marland Kitchen levalbuterol / XOPENEX HFA  inhaler Inhale 2 puffs into the lungs every 4 (four) hours as needed for wheezing.  Marland Kitchen levothyroxine  50 MCG tablet TAKE 1 TABLET(50 MCG) BY MOUTH DAILY  . Magnesium 250  MG TABS Take 2 tablets by mouth daily.   . meloxicam (MOBIC) 15 MG tablet TAKE 1 TABLET(15 MG) BY MOUTH DAILY  . montelukast (SINGULAIR) 10 MG tablet TAKE 1 TABLET(10 MG) BY MOUTH AT BEDTIME  . pantoprazole (PROTONIX) 40 MG tablet Take 40 mg by mouth 2 (two) times daily.   . promethazine (PHENERGAN) 25 MG tablet Take 1 tablet (25 mg total) by mouth every 6 (six) hours as needed for nausea or vomiting.   Allergies  Allergen Reactions  . Demerol [Meperidine] Other (See Comments)    REACTION: unknown--itching  . Epinephrine Other (See Comments)    "Sensitivity."  . Niacin And Related Rash   Past Medical History  Diagnosis Date  . History of migraine     takes Relpax daily as  needed  . HSV (herpes simplex virus) infection   . Hyperlipidemia     takes Atorvastatin daily  . Hypertension     takes Benicar daily  . Heart murmur   . MVP (mitral valve prolapse)   . Asthma     Dulera daily  . Seasonal allergies     takes Allegra daily  . Shortness of breath     with exertion  . Pneumonia     hx of--been many yrs ago  . Chronic back pain     HNP  . GERD (gastroesophageal reflux disease)     takes Protonix daily   . Hypothyroidism     takes Synthroid daily  . Depression     takes Clonazepam nightly  . Complication of anesthesia     slow to wake up   Health Maintenance  Topic Date Due  . COLONOSCOPY  04/04/2007  . PAP SMEAR  01/12/2014  . TETANUS/TDAP  08/28/2015  . INFLUENZA VACCINE  06/29/2016  . MAMMOGRAM  10/04/2016  . Hepatitis C Screening  Completed  . HIV Screening  Completed   Immunization History  Administered Date(s) Administered  . Influenza-Unspecified 09/18/2013  . PPD Test 04/17/2014  . Pneumococcal-Unspecified 11/30/1995  . Td 08/27/2005   Past Surgical History  Procedure Laterality Date  . Dilation and curettage of uterus  2011  . Ablation  2011  . Cholecystectomy  2008  . Tonsillectomy      at age 42  . Buttocks surgery      at age 42  . Cesarean section  1992  . Right knee arthroscopy    . Cervical fusion  03/2010  . Colonoscopy    . Esophagogastroduodenoscopy    . Lumbar laminectomy/decompression microdiscectomy  10/24/2012    Procedure: LUMBAR LAMINECTOMY/DECOMPRESSION MICRODISCECTOMY 1 LEVEL;  Surgeon: Kristeen Miss, MD   Family History  Problem Relation Age of Onset  . Arthritis Mother   . Heart attack Mother   . Depression Mother   . Allergies Mother   . Hypertension Mother   . Heart attack Father    Social History  Substance Use Topics  . Smoking status: Former Research scientist (life sciences)  . Smokeless tobacco: None     Comment: quit at age 60  . Alcohol Use: No     Comment: 2-3 times a yr    ROS Constitutional: Denies  fever, chills, weight loss/gain, headaches, insomnia,  night sweats, and change in appetite. Does c/o fatigue. Eyes: Denies redness, blurred vision, diplopia, discharge, itchy, watery eyes.  ENT: Denies discharge, congestion, post nasal drip, epistaxis, sore throat, earache, hearing loss, dental pain, Tinnitus, Vertigo, Sinus pain, snoring.  Cardio: Denies chest pain, palpitations, irregular heartbeat, syncope, dyspnea, diaphoresis,  orthopnea, PND, claudication, edema Respiratory: denies cough, dyspnea, DOE, pleurisy, hoarseness, laryngitis, wheezing.  Gastrointestinal: Denies dysphagia, heartburn, reflux, water brash, pain, cramps, nausea, vomiting, bloating, diarrhea, constipation, hematemesis, melena, hematochezia, jaundice, hemorrhoids Genitourinary: Denies dysuria, frequency, urgency, nocturia, hesitancy, discharge, hematuria, flank pain Breast: Breast lumps, nipple discharge, bleeding.  Musculoskeletal: Denies arthralgia, myalgia, stiffness, Jt. Swelling, pain, limp, and strain/sprain. Denies falls. Skin: Denies puritis, rash, hives, warts, acne, eczema, changing in skin lesion Neuro: No weakness, tremor, incoordination, spasms, paresthesia, pain Psychiatric: Denies confusion, memory loss, sensory loss. Denies Depression. Endocrine: Denies change in weight, skin, hair change, nocturia, and paresthesia, diabetic polys, visual blurring, hyper / hypo glycemic episodes.  Heme/Lymph: No excessive bleeding, bruising, enlarged lymph nodes.  Physical Exam  BP 140/76 mmHg  Pulse 84  Temp(Src) 97.2 F (36.2 C)  Resp 16  Ht 5\' 2"  (1.575 m)  Wt 117 lb 3.2 oz (53.162 kg)  BMI 21.43 kg/m2  General Appearance: Well nourished and in no apparent distress. Eyes: PERRLA, EOMs, conjunctiva no swelling or erythema, normal fundi and vessels. Sinuses: No frontal/maxillary tenderness ENT/Mouth: EACs patent / TMs  nl. Nares clear without erythema, swelling, mucoid exudates. Oral hygiene is good. No  erythema, swelling, or exudate. Tongue normal, non-obstructing. Tonsils not swollen or erythematous. Hearing normal.  Neck: Supple, thyroid normal. No bruits, nodes or JVD. Respiratory: Respiratory effort normal.  BS equal and clear bilateral without rales, rhonci, wheezing or stridor. Cardio: Heart sounds are normal with regular rate and rhythm and no murmurs, rubs or gallops. Peripheral pulses are normal and equal bilaterally without edema. No aortic or femoral bruits. Chest: symmetric with normal excursions and percussion. Breasts: Symmetric, without lumps, nipple discharge, retractions, or fibrocystic changes.  Abdomen: Flat, soft with bowel sounds active. Nontender, no guarding, rebound, hernias, masses, or organomegaly.  Lymphatics: Non tender without lymphadenopathy.  Genitourinary:  Musculoskeletal: Full ROM all peripheral extremities, joint stability, 5/5 strength, and normal gait. Skin: Warm and dry without rashes, lesions, cyanosis, clubbing or  ecchymosis.  Neuro: Cranial nerves intact, reflexes equal bilaterally. Normal muscle tone, no cerebellar symptoms. Sensation intact.  Pysch: Alert and oriented X 3, normal affect, Insight and Judgment appropriate.   Assessment and Plan  1. Annual Preventative Screening Examination  - Microalbumin / creatinine urine ratio - EKG 12-Lead - [place], RETROPERITNL ABD,  LTD - POC Hemoccult Bld/Stl  - Urinalysis, Routine w reflex microscopic  - CBC with Differential/Platelet - BASIC METABOLIC PANEL WITH GFR - Hepatic function panel - Magnesium - Lipid panel - TSH - Hemoglobin A1c - Insulin, random - VITAMIN D 25 Hydroxy   2. Essential hypertension  - Microalbumin / creatinine urine ratio - EKG 12-Lead - [place], RETROPERITNL ABD,  LTD - TSH  3. Hyperlipidemia  - Lipid panel - TSH  4. Prediabetes  - Hemoglobin A1c - Insulin, random  5. Vitamin D deficiency  - VITAMIN D 25 Hydroxy   6. Hypothyroidism  - TSH  7.  Gastroesophageal reflux disease   8. Migraine    9. Screening for rectal cancer  - POC Hemoccult Bld/Stl   10. Medication management  - Urinalysis, Routine w reflex microscopic - CBC with Differential/Platelet - BASIC METABOLIC PANEL WITH GFR - Hepatic function panel - Magnesium   Continue prudent diet as discussed, weight control, BP monitoring, regular exercise, and medications. Discussed med's effects and SE's. Screening labs and tests as requested with regular follow-up as recommended. Over 40 minutes of exam, counseling, chart review and high complex critical decision making was performed.

## 2016-05-05 NOTE — Patient Instructions (Signed)
Recommend Adult Low Dose Aspirin or   coated  Aspirin 81 mg daily   To reduce risk of Colon Cancer 20 %,   Skin Cancer 26 % ,   Melanoma 46%   and   Pancreatic cancer 60%   ++++++++++++++++++++++++++++++++++++++++++++++++++++++ Vitamin D goal   is between 70-100.   Please make sure that you are taking your Vitamin D as directed.   It is very important as a natural anti-inflammatory   helping hair, skin, and nails, as well as reducing stroke and heart attack risk.   It helps your bones and helps with mood.  It also decreases numerous cancer risks so please take it as directed.   Low Vit D is associated with a 200-300% higher risk for CANCER   and 200-300% higher risk for HEART   ATTACK  &  STROKE.   ......................................  It is also associated with higher death rate at younger ages,   autoimmune diseases like Rheumatoid arthritis, Lupus, Multiple Sclerosis.     Also many other serious conditions, like depression, Alzheimer's  Dementia, infertility, muscle aches, fatigue, fibromyalgia - just to name a few.  ++++++++++++++++++++++++++++++++++++++++++++++++  Recommend the book "The END of DIETING" by Dr Joel Fuhrman   & the book "The END of DIABETES " by Dr Joel Fuhrman  At [employer] - get book & Audio CD's     Being diabetic has a  300% increased risk for heart attack, stroke, cancer, and alzheimer- type vascular dementia. It is very important that you work harder with diet by avoiding all foods that are white. Avoid white rice (brown & wild rice is OK), white potatoes (sweetpotatoes in moderation is OK), White bread or wheat bread or anything made out of white flour like bagels, donuts, rolls, buns, biscuits, cakes, pastries, cookies, pizza crust, and pasta (made from white flour & egg whites) - vegetarian pasta or spinach or wheat pasta is OK. Multigrain breads like Arnold's or Pepperidge Farm, or multigrain sandwich thins or flatbreads.  Diet,  exercise and weight loss can reverse and cure diabetes in the early stages.  Diet, exercise and weight loss is very important in the control and prevention of complications of diabetes which affects every system in your body, ie. Brain - dementia/stroke, eyes - glaucoma/blindness, heart - heart attack/heart failure, kidneys - dialysis, stomach - gastric paralysis, intestines - malabsorption, nerves - severe painful neuritis, circulation - gangrene & loss of a leg(s), and finally cancer and Alzheimers.    I recommend avoid fried & greasy foods,  sweets/candy, white rice (brown or wild rice or Quinoa is OK), white potatoes (sweet potatoes are OK) - anything made from white flour - bagels, doughnuts, rolls, buns, biscuits,white and wheat breads, pizza crust and traditional pasta made of white flour & egg white(vegetarian pasta or spinach or wheat pasta is OK).  Multi-grain bread is OK - like multi-grain flat bread or sandwich thins. Avoid alcohol in excess. Exercise is also important.    Eat all the vegetables you want - avoid meat, especially red meat and dairy - especially cheese.  Cheese is the most concentrated form of trans-fats which is the worst thing to clog up our arteries. Veggie cheese is OK which can be found in the fresh produce section at [employer] or [employer] or [employer]  ++++++++++++++++++++++++++++++++++++++++++++++++++ DASH Eating Plan  DASH stands for "Dietary Approaches to Stop Hypertension."   The DASH eating plan is a healthy eating plan that has been shown to reduce high blood   pressure (hypertension). Additional health benefits may include reducing the risk of type 2 diabetes mellitus, heart disease, and stroke. The DASH eating plan may also help with weight loss.  WHAT DO I NEED TO KNOW ABOUT THE DASH EATING PLAN?  For the DASH eating plan, you will follow these general guidelines:  Choose foods with a percent daily value for sodium of less than 5% (as listed on the food  label).  Use salt-free seasonings or herbs instead of table salt or sea salt.  Check with your health care provider or pharmacist before using salt substitutes.  Eat lower-sodium products, often labeled as "lower sodium" or "no salt added."  Eat fresh foods.  Eat more vegetables, fruits, and low-fat dairy products.    Choose whole grains. Look for the word "whole" as the first word in the ingredient list.  Choose fish   Limit sweets, desserts, sugars, and sugary drinks.  Choose heart-healthy fats.  Eat veggie cheese   Eat more home-cooked food and less restaurant, buffet, and fast food.  Limit fried foods.  Cook foods using methods other than frying.  Limit canned vegetables. If you do use them, rinse them well to decrease the sodium.  When eating at a restaurant, ask that your food be prepared with less salt, or no salt if possible.                      WHAT FOODS CAN I EAT?  Read Dr Joel Fuhrman's books on The End of Dieting & The End of Diabetes  Grains  Whole grain or whole wheat bread. Brown rice. Whole grain or whole wheat pasta. Quinoa, bulgur, and whole grain cereals. Low-sodium cereals. Corn or whole wheat flour tortillas. Whole grain cornbread. Whole grain crackers. Low-sodium crackers.  Vegetables  Fresh or frozen vegetables (raw, steamed, roasted, or grilled). Low-sodium or reduced-sodium tomato and vegetable juices. Low-sodium or reduced-sodium tomato sauce and paste. Low-sodium or reduced-sodium canned vegetables.   Fruits  All fresh, canned (in natural juice), or frozen fruits.  Protein Products   All fish and seafood.  Dried beans, peas, or lentils. Unsalted nuts and seeds. Unsalted canned beans.  Dairy  Low-fat dairy products, such as skim or 1% milk, 2% or reduced-fat cheeses, low-fat ricotta or cottage cheese, or plain low-fat yogurt. Low-sodium or reduced-sodium cheeses.  Fats and Oils  Tub margarines without trans fats. Light or  reduced-fat mayonnaise and salad dressings (reduced sodium). Avocado. Safflower, olive, or canola oils. Natural peanut or almond butter.  Other  Unsalted popcorn and pretzels. The items listed above may not be a complete list of recommended foods or beverages. Contact your dietitian for more options.  +++++++++++++++++++++++++++++++++++++++++++  WHAT FOODS ARE NOT RECOMMENDED?  Grains/ White flour or wheat flour  White bread. White pasta. White rice. Refined cornbread. Bagels and croissants. Crackers that contain trans fat.  Vegetables  Creamed or fried vegetables. Vegetables in a . Regular canned vegetables. Regular canned tomato sauce and paste. Regular tomato and vegetable juices.  Fruits  Dried fruits. Canned fruit in light or heavy syrup. Fruit juice.  Meat and Other Protein Products  Meat in general - RED mwaet & White meat.  Fatty cuts of meat. Ribs, chicken wings, bacon, sausage, bologna, salami, chitterlings, fatback, hot dogs, bratwurst, and packaged luncheon meats.  Dairy  Whole or 2% milk, cream, half-and-half, and cream cheese. Whole-fat or sweetened yogurt. Full-fat cheeses or blue cheese. Nondairy creamers and whipped toppings. Processed cheese, cheese spreads, or   cheese curds.  Condiments  Onion and garlic salt, seasoned salt, table salt, and sea salt. Canned and packaged gravies. Worcestershire sauce. Tartar sauce. Barbecue sauce. Teriyaki sauce. Soy sauce, including reduced sodium. Steak sauce. Fish sauce. Oyster sauce. Cocktail sauce. Horseradish. Ketchup and mustard. Meat flavorings and tenderizers. Bouillon cubes. Hot sauce. Tabasco sauce. Marinades. Taco seasonings. Relishes.  Fats and Oils Butter, stick margarine, lard, shortening and bacon fat. Coconut, palm kernel, or palm oils. Regular salad dressings.  Pickles and olives. Salted popcorn and pretzels.  The items listed above may not be a complete list of foods and beverages to avoid.  Preventative  Care for Adults - Female      MAINTAIN REGULAR HEALTH EXAMS:  A routine yearly physical is a good way to check in with your primary care provider about your health and preventive screening. It is also an opportunity to share updates about your health and any concerns you have, and receive a thorough all-over exam.   Most health insurance companies pay for at least some preventative services.  Check with your health plan for specific coverages.  WHAT PREVENTATIVE SERVICES DO WOMEN NEED?  Adult women should have their weight and blood pressure checked regularly.   Women age 51 and older should have their cholesterol levels checked regularly.  Women should be screened for cervical cancer with a Pap smear and pelvic exam beginning at either age 52, or 3 years after they become sexually activity.    Breast cancer screening generally begins at age 35 with a mammogram and breast exam by your primary care provider.    Beginning at age 75 and continuing to age 2, women should be screened for colorectal cancer.  Certain people may need continued testing until age 21.  Updating vaccinations is part of preventative care.  Vaccinations help protect against diseases such as the flu.  Osteoporosis is a disease in which the bones lose minerals and strength as we age. Women ages 62 and over should discuss this with their caregivers, as should women after menopause who have other risk factors.  Lab tests are generally done as part of preventative care to screen for anemia and blood disorders, to screen for problems with the kidneys and liver, to screen for bladder problems, to check blood sugar, and to check your cholesterol level.  Preventative services generally include counseling about diet, exercise, avoiding tobacco, drugs, excessive alcohol consumption, and sexually transmitted infections.    GENERAL RECOMMENDATIONS FOR GOOD HEALTH:  Healthy diet:  Eat a variety of foods, including fruit,  vegetables, animal or vegetable protein, such as meat, fish, chicken, and eggs, or beans, lentils, tofu, and grains, such as rice.  Drink plenty of water daily.  Decrease saturated fat in the diet, avoid lots of red meat, processed foods, sweets, fast foods, and fried foods.  Exercise:  Aerobic exercise helps maintain good heart health. At least 30-40 minutes of moderate-intensity exercise is recommended. For example, a brisk walk that increases your heart rate and breathing. This should be done on most days of the week.   Find a type of exercise or a variety of exercises that you enjoy so that it becomes a part of your daily life.  Examples are running, walking, swimming, water aerobics, and biking.  For motivation and support, explore group exercise such as aerobic class, spin class, Zumba, Yoga,or  martial arts, etc.    Set exercise goals for yourself, such as a certain weight goal, walk or run in a race  such as a 5k walk/run.  Speak to your primary care provider about exercise goals.  Disease prevention:  If you smoke or chew tobacco, find out from your caregiver how to quit. It can literally save your life, no matter how long you have been a tobacco user. If you do not use tobacco, never begin.   Maintain a healthy diet and normal weight. Increased weight leads to problems with blood pressure and diabetes.   The Body Mass Index or BMI is a way of measuring how much of your body is fat. Having a BMI above 27 increases the risk of heart disease, diabetes, hypertension, stroke and other problems related to obesity. Your caregiver can help determine your BMI and based on it develop an exercise and dietary program to help you achieve or maintain this important measurement at a healthful level.  High blood pressure causes heart and blood vessel problems.  Persistent high blood pressure should be treated with medicine if weight loss and exercise do not work.   Fat and cholesterol leaves  deposits in your arteries that can block them. This causes heart disease and vessel disease elsewhere in your body.  If your cholesterol is found to be high, or if you have heart disease or certain other medical conditions, then you may need to have your cholesterol monitored frequently and be treated with medication.   Ask if you should have a cardiac stress test if your history suggests this. A stress test is a test done on a treadmill that looks for heart disease. This test can find disease prior to there being a problem.  Menopause can be associated with physical symptoms and risks. Hormone replacement therapy is available to decrease these. You should talk to your caregiver about whether starting or continuing to take hormones is right for you.   Osteoporosis is a disease in which the bones lose minerals and strength as we age. This can result in serious bone fractures. Risk of osteoporosis can be identified using a bone density scan. Women ages 65 and over should discuss this with their caregivers, as should women after menopause who have other risk factors. Ask your caregiver whether you should be taking a calcium supplement and Vitamin D, to reduce the rate of osteoporosis.   Avoid drinking alcohol in excess (more than two drinks per day).  Avoid use of street drugs. Do not share needles with anyone. Ask for professional help if you need assistance or instructions on stopping the use of alcohol, cigarettes, and/or drugs.  Brush your teeth twice a day with fluoride toothpaste, and floss once a day. Good oral hygiene prevents tooth decay and gum disease. The problems can be painful, unattractive, and can cause other health problems. Visit your dentist for a routine oral and dental check up and preventive care every 6-12 months.   Look at your skin regularly.  Use a mirror to look at your back. Notify your caregivers of changes in moles, especially if there are changes in shapes, colors, a size  larger than a pencil eraser, an irregular border, or development of new moles.  Safety:  Use seatbelts 100% of the time, whether driving or as a passenger.  Use safety devices such as hearing protection if you work in environments with loud noise or significant background noise.  Use safety glasses when doing any work that could send debris in to the eyes.  Use a helmet if you ride a bike or motorcycle.  Use appropriate safety gear  for contact sports.  Talk to your caregiver about gun safety.  Use sunscreen with a SPF (or skin protection factor) of 15 or greater.  Lighter skinned people are at a greater risk of skin cancer. Don't forget to also wear sunglasses in order to protect your eyes from too much damaging sunlight. Damaging sunlight can accelerate cataract formation.   Practice safe sex. Use condoms. Condoms are used for birth control and to help reduce the spread of sexually transmitted infections (or STIs).  Some of the STIs are gonorrhea (the clap), chlamydia, syphilis, trichomonas, herpes, HPV (human papilloma virus) and HIV (human immunodeficiency virus) which causes AIDS. The herpes, HIV and HPV are viral illnesses that have no cure. These can result in disability, cancer and death.   Keep carbon monoxide and smoke detectors in your home functioning at all times. Change the batteries every 6 months or use a model that plugs into the wall.   Vaccinations:  Stay up to date with your tetanus shots and other required immunizations. You should have a booster for tetanus every 10 years. Be sure to get your flu shot every year, since 5%-20% of the [place] population comes down with the flu. The flu vaccine changes each year, so being vaccinated once is not enough. Get your shot in the fall, before the flu season peaks.   Other vaccines to consider:  Human Papilloma Virus or HPV causes cancer of the cervix, and other infections that can be transmitted from person to person. There is a vaccine for  HPV, and females should get immunized between the ages of 35 and 85. It requires a series of 3 shots.   Pneumococcal vaccine to protect against certain types of pneumonia.  This is normally recommended for adults age 51 or older.  However, adults younger than 59 years old with certain underlying conditions such as diabetes, heart or lung disease should also receive the vaccine.  Shingles vaccine to protect against Varicella Zoster if you are older than age 33, or younger than 59 years old with certain underlying illness.  Hepatitis A vaccine to protect against a form of infection of the liver by a virus acquired from food.  Hepatitis B vaccine to protect against a form of infection of the liver by a virus acquired from blood or body fluids, particularly if you work in health care.  If you plan to travel internationally, check with your local health department for specific vaccination recommendations.  Cancer Screening:  Breast cancer screening is essential to preventive care for women. All women age 55 and older should perform a breast self-exam every month. At age 35 and older, women should have their caregiver complete a breast exam each year. Women at ages 12 and older should have a mammogram (x-ray film) of the breasts. Your caregiver can discuss how often you need mammograms.    Cervical cancer screening includes taking a Pap smear (sample of cells examined under a microscope) from the cervix (end of the uterus). It also includes testing for HPV (Human Papilloma Virus, which can cause cervical cancer). Screening and a pelvic exam should begin at age 82, or 3 years after a woman becomes sexually active. Screening should occur every year, with a Pap smear but no HPV testing, up to age 69. After age 41, you should have a Pap smear every 3 years with HPV testing, if no HPV was found previously.   Most routine colon cancer screening begins at the age of 68. On a  yearly basis, doctors may provide  special easy to use take-home tests to check for hidden blood in the stool. Sigmoidoscopy or colonoscopy can detect the earliest forms of colon cancer and is life saving. These tests use a small camera at the end of a tube to directly examine the colon. Speak to your caregiver about this at age 68, when routine screening begins (and is repeated every 5 years unless early forms of pre-cancerous polyps or small growths are found).

## 2016-05-06 ENCOUNTER — Other Ambulatory Visit: Payer: Self-pay | Admitting: *Deleted

## 2016-05-06 ENCOUNTER — Other Ambulatory Visit: Payer: Self-pay | Admitting: Internal Medicine

## 2016-05-06 DIAGNOSIS — D649 Anemia, unspecified: Secondary | ICD-10-CM

## 2016-05-06 LAB — INSULIN, RANDOM: Insulin: 27.7 u[IU]/mL — ABNORMAL HIGH (ref 2.0–19.6)

## 2016-05-06 LAB — URINALYSIS, ROUTINE W REFLEX MICROSCOPIC
Bilirubin Urine: NEGATIVE
Glucose, UA: NEGATIVE
Hgb urine dipstick: NEGATIVE
Leukocytes, UA: NEGATIVE
Nitrite: NEGATIVE
Protein, ur: NEGATIVE
Specific Gravity, Urine: 1.03 (ref 1.001–1.035)
pH: 5 (ref 5.0–8.0)

## 2016-05-06 LAB — MICROALBUMIN / CREATININE URINE RATIO
Creatinine, Urine: 220 mg/dL (ref 20–320)
Microalb Creat Ratio: 4 mcg/mg creat (ref <30)
Microalb, Ur: 0.9 mg/dL

## 2016-05-06 LAB — VITAMIN D 25 HYDROXY (VIT D DEFICIENCY, FRACTURES): Vit D, 25-Hydroxy: 59 ng/mL (ref 30–100)

## 2016-05-06 MED ORDER — ALBUTEROL SULFATE HFA 108 (90 BASE) MCG/ACT IN AERS: 2.0000 | INHALATION_SPRAY | RESPIRATORY_TRACT | Status: DC | PRN

## 2016-05-15 ENCOUNTER — Other Ambulatory Visit: Payer: Self-pay | Admitting: Physician Assistant

## 2016-05-20 ENCOUNTER — Encounter: Payer: Self-pay | Admitting: Internal Medicine

## 2016-05-24 ENCOUNTER — Other Ambulatory Visit: Payer: Self-pay | Admitting: Internal Medicine

## 2016-05-26 ENCOUNTER — Other Ambulatory Visit: Payer: Self-pay | Admitting: *Deleted

## 2016-05-26 MED ORDER — RIZATRIPTAN BENZOATE 10 MG PO TBDP: 10.0000 mg | ORAL_TABLET | ORAL | Status: DC | PRN

## 2016-06-03 ENCOUNTER — Encounter: Payer: Self-pay | Admitting: Internal Medicine

## 2016-06-21 ENCOUNTER — Other Ambulatory Visit: Payer: Self-pay | Admitting: Internal Medicine

## 2016-06-30 ENCOUNTER — Ambulatory Visit (INDEPENDENT_AMBULATORY_CARE_PROVIDER_SITE_OTHER): Payer: Medicare Other | Admitting: Physician Assistant

## 2016-06-30 ENCOUNTER — Encounter: Payer: Self-pay | Admitting: Physician Assistant

## 2016-06-30 VITALS — BP 120/72 | HR 76 | Temp 97.2°F | Resp 16 | Ht 62.0 in | Wt 113.6 lb

## 2016-06-30 DIAGNOSIS — M25512 Pain in left shoulder: Secondary | ICD-10-CM

## 2016-06-30 MED ORDER — TRAMADOL HCL 50 MG PO TABS: ORAL_TABLET | ORAL | 0 refills | Status: DC

## 2016-06-30 NOTE — Progress Notes (Signed)
Subjective:    Patient ID: Sarah Rodriguez, female    DOB: 1956-01-03, 60 y.o.   MRN: [id number]  HPI 60 y.o. right handed WF presents for left shoulder/elbow pain x 2 weeks. She states she has OA in her back, unable to lay down for a long time, she will play on her Ipad while sitting in her recliner often while putting pressure on her elbows. She has most pain in her shoulder but has pain in her left elbow as well. Denies weakness, numbness,tingling. She is on mobic but nothing else. Has seen Dr. Chalmers Cater in the past negative RA work up. Has seen Dr. Ellene Route for fusion.   Blood pressure 120/72, pulse 76, temperature 97.2 F (36.2 C), temperature source Temporal, resp. rate 16, height 5\' 2"  (1.575 m), weight 113 lb 9.6 oz (51.5 kg), SpO2 98 %.  Medications Current Outpatient Prescriptions on File Prior to Visit  Medication Sig  . albuterol (PROVENTIL HFA;VENTOLIN HFA) 108 (90 Base) MCG/ACT inhaler Inhale 2 puffs into the lungs every 4 (four) hours as needed for wheezing or shortness of breath.  Marland Kitchen atorvastatin (LIPITOR) 10 MG tablet TAKE 1 TABLET BY MOUTH EVERY DAY  . Cholecalciferol (VITAMIN D PO) Take 4,000 Units by mouth daily.   Marland Kitchen CINNAMON PO Take 1,000 Units by mouth 2 (two) times daily.   . clonazePAM (KLONOPIN) 0.5 MG tablet Take 0.125 mg by mouth 2 (two) times daily as needed. For sleep.  . Cyanocobalamin (VITAMIN B-12) 2500 MCG SUBL Place 1 tablet under the tongue daily.   . DULoxetine (CYMBALTA) 30 MG capsule TAKE ONE CAPSULE BY MOUTH THREE TIMES DAILY  . enalapril (VASOTEC) 20 MG tablet TAKE 1 TABLET BY MOUTH DAILY  . fexofenadine-pseudoephedrine (ALLEGRA-D 12 HOUR) 60-120 MG per tablet Take 1 tablet by mouth daily.  . fluconazole (DIFLUCAN) 150 MG tablet Take 1 tablet weekly for yeast infection  . fluticasone (FLONASE) 50 MCG/ACT nasal spray SHAKE WELL AND USE 2 SPRAYS IN EACH NOSTRIL EVERY EVENING  . fluticasone-salmeterol (ADVAIR HFA) 115-21 MCG/ACT inhaler 2 inhalations 10-15  minutes apart every 12 hours  . gabapentin (NEURONTIN) 300 MG capsule Take 300 mg by mouth 3 (three) times daily.   Marland Kitchen levothyroxine (SYNTHROID, LEVOTHROID) 50 MCG tablet TAKE 1 TABLET(50 MCG) BY MOUTH DAILY  . Magnesium 250 MG TABS Take 2 tablets by mouth daily.   . meloxicam (MOBIC) 15 MG tablet TAKE 1 TABLET(15 MG) BY MOUTH DAILY  . montelukast (SINGULAIR) 10 MG tablet TAKE 1 TABLET(10 MG) BY MOUTH AT BEDTIME  . pantoprazole (PROTONIX) 40 MG tablet Take 40 mg by mouth 2 (two) times daily.   . promethazine (PHENERGAN) 25 MG tablet Take 1 tablet (25 mg total) by mouth every 6 (six) hours as needed for nausea or vomiting.  . rizatriptan (MAXALT-MLT) 10 MG disintegrating tablet Take 1 tablet (10 mg total) by mouth as needed for migraine. May repeat in 2 hours if needed   No current facility-administered medications on file prior to visit.     Problem list She has Hyperlipidemia; Hypertension; Asthma; GERD (gastroesophageal reflux disease); Hypothyroidism; Vitamin D deficiency; Medication management; Prediabetes; Spondylolisthesis at L4-L5 level; Encounter for Medicare annual wellness exam; and Migraine on her problem list.  Review of Systems  Constitutional: Negative.   HENT: Negative.   Eyes: Negative.   Respiratory: Negative.   Cardiovascular: Negative.   Gastrointestinal: Negative.   Genitourinary: Negative.   Musculoskeletal: Positive for arthralgias (left shoulder/elbow). Negative for joint swelling, myalgias and neck pain.  Skin: Negative.   Neurological: Negative.   Psychiatric/Behavioral: Negative.        Objective:   Physical Exam  Constitutional: She is oriented to person, place, and time. She appears well-developed and well-nourished.  HENT:  Head: Normocephalic and atraumatic.  Neck: Normal range of motion. Neck supple.  Cardiovascular: Normal rate and regular rhythm.   Pulmonary/Chest: Effort normal and breath sounds normal.  Abdominal: Soft. Bowel sounds are normal.   Musculoskeletal:       Right shoulder: She exhibits normal range of motion, no tenderness, no bony tenderness, no swelling, no effusion, no crepitus, no deformity, no laceration, no pain, no spasm, normal pulse and normal strength.       Left shoulder: She exhibits tenderness. She exhibits no bony tenderness, no swelling, no effusion, normal pulse and normal strength.  Left shoulder without deformity, swelling. + pain with palpation at bicipital groove, + pain with empty can test, + pain with full abduction, good strength bilateral arms, normal distal neurovascular.    Lymphadenopathy:    She has no cervical adenopathy.  Neurological: She is alert and oriented to person, place, and time. She has normal reflexes.  Skin: Skin is warm and dry. No rash noted.        Assessment & Plan:  1. Left shoulder pain Likely bicipital tendonitis with possible rotator cuff tendonitis.  Would benefit from infection/PT, will refer to ortho - Ambulatory referral to Orthopedics - traMADol (ULTRAM) 50 MG tablet; 1 pill twice daily as needed for pain  Dispense: 60 tablet; Refill: 0

## 2016-07-20 ENCOUNTER — Other Ambulatory Visit: Payer: Self-pay | Admitting: Internal Medicine

## 2016-07-21 NOTE — Telephone Encounter (Signed)
Rx called in to [facility]

## 2016-08-03 ENCOUNTER — Other Ambulatory Visit: Payer: Self-pay | Admitting: Internal Medicine

## 2016-08-03 MED ORDER — FLUTICASONE PROPIONATE 50 MCG/ACT NA SUSP: NASAL | 1 refills | Status: DC

## 2016-08-05 ENCOUNTER — Encounter: Payer: Self-pay | Admitting: Internal Medicine

## 2016-08-05 ENCOUNTER — Ambulatory Visit (INDEPENDENT_AMBULATORY_CARE_PROVIDER_SITE_OTHER): Payer: Medicare Other | Admitting: Internal Medicine

## 2016-08-05 VITALS — BP 118/70 | HR 72 | Temp 98.2°F | Resp 16 | Ht 62.0 in | Wt 111.0 lb

## 2016-08-05 DIAGNOSIS — E785 Hyperlipidemia, unspecified: Secondary | ICD-10-CM

## 2016-08-05 DIAGNOSIS — E559 Vitamin D deficiency, unspecified: Secondary | ICD-10-CM

## 2016-08-05 DIAGNOSIS — R7303 Prediabetes: Secondary | ICD-10-CM

## 2016-08-05 DIAGNOSIS — E039 Hypothyroidism, unspecified: Secondary | ICD-10-CM

## 2016-08-05 DIAGNOSIS — M8949 Other hypertrophic osteoarthropathy, multiple sites: Secondary | ICD-10-CM

## 2016-08-05 DIAGNOSIS — F322 Major depressive disorder, single episode, severe without psychotic features: Secondary | ICD-10-CM

## 2016-08-05 DIAGNOSIS — I10 Essential (primary) hypertension: Secondary | ICD-10-CM | POA: Diagnosis not present

## 2016-08-05 DIAGNOSIS — F3341 Major depressive disorder, recurrent, in partial remission: Secondary | ICD-10-CM | POA: Insufficient documentation

## 2016-08-05 DIAGNOSIS — M159 Polyosteoarthritis, unspecified: Secondary | ICD-10-CM

## 2016-08-05 DIAGNOSIS — Z79899 Other long term (current) drug therapy: Secondary | ICD-10-CM

## 2016-08-05 DIAGNOSIS — M797 Fibromyalgia: Secondary | ICD-10-CM

## 2016-08-05 DIAGNOSIS — M150 Primary generalized (osteo)arthritis: Secondary | ICD-10-CM

## 2016-08-05 DIAGNOSIS — M1990 Unspecified osteoarthritis, unspecified site: Secondary | ICD-10-CM | POA: Insufficient documentation

## 2016-08-05 LAB — CBC WITH DIFFERENTIAL/PLATELET
Basophils Absolute: 0 cells/uL (ref 0–200)
Basophils Relative: 0 %
Eosinophils Absolute: 138 cells/uL (ref 15–500)
Eosinophils Relative: 3 %
HCT: 35 % (ref 35.0–45.0)
Hemoglobin: 11.2 g/dL — ABNORMAL LOW (ref 11.7–15.5)
Lymphocytes Relative: 29 %
Lymphs Abs: 1334 cells/uL (ref 850–3900)
MCH: 26.9 pg — ABNORMAL LOW (ref 27.0–33.0)
MCHC: 32 g/dL (ref 32.0–36.0)
MCV: 84.1 fL (ref 80.0–100.0)
MPV: 9.2 fL (ref 7.5–12.5)
Monocytes Absolute: 368 cells/uL (ref 200–950)
Monocytes Relative: 8 %
Neutro Abs: 2760 cells/uL (ref 1500–7800)
Neutrophils Relative %: 60 %
Platelets: 293 10*3/uL (ref 140–400)
RBC: 4.16 MIL/uL (ref 3.80–5.10)
RDW: 16.7 % — ABNORMAL HIGH (ref 11.0–15.0)
WBC: 4.6 10*3/uL (ref 3.8–10.8)

## 2016-08-05 LAB — BASIC METABOLIC PANEL WITH GFR
BUN: 17 mg/dL (ref 7–25)
CO2: 25 mmol/L (ref 20–31)
Calcium: 9.5 mg/dL (ref 8.6–10.4)
Chloride: 105 mmol/L (ref 98–110)
Creat: 0.8 mg/dL (ref 0.50–1.05)
GFR, Est African American: >89 mL/min (ref >=60)
GFR, Est Non African American: 81 mL/min (ref >=60)
Glucose, Bld: 100 mg/dL — ABNORMAL HIGH (ref 65–99)
Potassium: 4.2 mmol/L (ref 3.5–5.3)
Sodium: 142 mmol/L (ref 135–146)

## 2016-08-05 LAB — LIPID PANEL
Cholesterol: 191 mg/dL (ref 125–200)
HDL: 44 mg/dL — ABNORMAL LOW (ref >=46)
LDL Cholesterol: 121 mg/dL (ref <130)
Total CHOL/HDL Ratio: 4.3 Ratio (ref <=5.0)
Triglycerides: 130 mg/dL (ref <150)
VLDL: 26 mg/dL (ref <30)

## 2016-08-05 LAB — HEPATIC FUNCTION PANEL
ALT: 16 U/L (ref 6–29)
AST: 19 U/L (ref 10–35)
Albumin: 4.4 g/dL (ref 3.6–5.1)
Alkaline Phosphatase: 69 U/L (ref 33–130)
Bilirubin, Direct: 0.1 mg/dL (ref <=0.2)
Indirect Bilirubin: 0.4 mg/dL (ref 0.2–1.2)
Total Bilirubin: 0.5 mg/dL (ref 0.2–1.2)
Total Protein: 6.8 g/dL (ref 6.1–8.1)

## 2016-08-05 LAB — TSH: TSH: 0.95 mIU/L

## 2016-08-05 MED ORDER — DULOXETINE HCL 60 MG PO CPEP: 60.0000 mg | ORAL_CAPSULE | Freq: Two times a day (BID) | ORAL | 2 refills | Status: DC

## 2016-08-05 MED ORDER — DICLOFENAC SODIUM 1 % TD GEL: 4.0000 g | Freq: Four times a day (QID) | TRANSDERMAL | 3 refills | Status: DC

## 2016-08-05 NOTE — Progress Notes (Signed)
Assessment and Plan:  Hypertension:  -Continue medication,  -monitor blood pressure at home.  -Continue DASH diet.   -Reminder to go to the ER if any CP, SOB, nausea, dizziness, severe HA, changes vision/speech, left arm numbness and tingling, and jaw pain.  Cholesterol: -Continue diet and exercise.  -Check cholesterol.   Pre-diabetes: -Continue diet and exercise.  -Check A1C  Vitamin D Def: -check level -continue medications.   Extreme anxiety and depression -recommended therapy -increase cymbalta to 60 mg BID -on max dose of cymbalta -can consider adding in abilfy or vraylar  Arthritis -already on meloxicam -already on gabapentin -will try voltaren gel  Continue diet and meds as discussed. Further disposition pending results of labs.  HPI 60 y.o. female  presents for 3 month follow up with hypertension, hyperlipidemia, prediabetes and vitamin D.   Her blood pressure has been controlled at home, today their BP is BP: 118/70.   She does workout. She denies chest pain, shortness of breath, dizziness.   She is on cholesterol medication and denies myalgias. Her cholesterol is at goal. The cholesterol last visit was:   Lab Results  Component Value Date   CHOL 174 05/05/2016   HDL 44 (L) 05/05/2016   LDLCALC 96 05/05/2016   TRIG 172 (H) 05/05/2016   CHOLHDL 4.0 05/05/2016     She has been working on diet and exercise for prediabetes, and denies foot ulcerations, hyperglycemia, hypoglycemia , increased appetite, nausea, paresthesia of the feet, polydipsia, polyuria, visual disturbances, vomiting and weight loss. Last A1C in the office was:  Lab Results  Component Value Date   HGBA1C 6.0 (H) 05/05/2016    Patient is on Vitamin D supplement.  Lab Results  Component Value Date   VD25OH 65 05/05/2016      She reports that she has been having some issues with Dr. Caprice Beaver.  She reports that she is having a hard time affording going to the office.  She reports that she  doesn't want to go back due to financial issues and coldness of the employees.  She is also having issues with crying and feels like she needs to have her medications changed.  She would prefer to have her medications managed here if possible.  She also notes that her joints are really bothering her.  She is taking meloxicam and also is taking gabapentin.  She has had some relief with voltaren in the past.    Current Medications:  Current Outpatient Prescriptions on File Prior to Visit  Medication Sig Dispense Refill  . albuterol (PROVENTIL HFA;VENTOLIN HFA) 108 (90 Base) MCG/ACT inhaler Inhale 2 puffs into the lungs every 4 (four) hours as needed for wheezing or shortness of breath. 1 Inhaler 3  . atorvastatin (LIPITOR) 10 MG tablet TAKE 1 TABLET BY MOUTH EVERY DAY 90 tablet 1  . Cholecalciferol (VITAMIN D PO) Take 4,000 Units by mouth daily.     Marland Kitchen CINNAMON PO Take 1,000 Units by mouth 2 (two) times daily.     . clonazePAM (KLONOPIN) 0.5 MG tablet TAKE 1 TABLET BY MOUTH TWICE DAILY 60 tablet 0  . Cyanocobalamin (VITAMIN B-12) 2500 MCG SUBL Place 1 tablet under the tongue daily.     . DULoxetine (CYMBALTA) 30 MG capsule TAKE ONE CAPSULE BY MOUTH THREE TIMES DAILY 270 capsule 1  . enalapril (VASOTEC) 20 MG tablet TAKE 1 TABLET BY MOUTH DAILY 90 tablet 1  . fexofenadine-pseudoephedrine (ALLEGRA-D 12 HOUR) 60-120 MG per tablet Take 1 tablet by mouth daily.    Marland Kitchen  fluconazole (DIFLUCAN) 150 MG tablet Take 1 tablet weekly for yeast infection 4 tablet 3  . fluticasone (FLONASE) 50 MCG/ACT nasal spray SHAKE WELL AND USE 2 SPRAYS IN EACH NOSTRIL EVERY EVENING 48 g 1  . fluticasone-salmeterol (ADVAIR HFA) 115-21 MCG/ACT inhaler 2 inhalations 10-15 minutes apart every 12 hours 3 Inhaler 3  . gabapentin (NEURONTIN) 300 MG capsule Take 300 mg by mouth 3 (three) times daily.   0  . levothyroxine (SYNTHROID, LEVOTHROID) 50 MCG tablet TAKE 1 TABLET(50 MCG) BY MOUTH DAILY 90 tablet 1  . Magnesium 250 MG TABS Take  2 tablets by mouth daily.     . meloxicam (MOBIC) 15 MG tablet TAKE 1 TABLET(15 MG) BY MOUTH DAILY 90 tablet 1  . montelukast (SINGULAIR) 10 MG tablet TAKE 1 TABLET(10 MG) BY MOUTH AT BEDTIME 90 tablet 3  . pantoprazole (PROTONIX) 40 MG tablet Take 40 mg by mouth 2 (two) times daily.     . promethazine (PHENERGAN) 25 MG tablet Take 1 tablet (25 mg total) by mouth every 6 (six) hours as needed for nausea or vomiting. 30 tablet 2  . rizatriptan (MAXALT-MLT) 10 MG disintegrating tablet Take 1 tablet (10 mg total) by mouth as needed for migraine. May repeat in 2 hours if needed 18 tablet 5  . traMADol (ULTRAM) 50 MG tablet 1 pill twice daily as needed for pain 60 tablet 0   No current facility-administered medications on file prior to visit.     Medical History:  Past Medical History:  Diagnosis Date  . Asthma    Dulera daily  . Chronic back pain    HNP  . Complication of anesthesia    slow to wake up  . Depression    takes Clonazepam nightly  . GERD (gastroesophageal reflux disease)    takes Protonix daily   . Heart murmur   . History of migraine    takes Relpax daily as needed  . HSV (herpes simplex virus) infection   . Hyperlipidemia    takes Atorvastatin daily  . Hypertension    takes Benicar daily  . Hypothyroidism    takes Synthroid daily  . MVP (mitral valve prolapse)   . Pneumonia    hx of--been many yrs ago  . Seasonal allergies    takes Allegra daily  . Shortness of breath    with exertion    Allergies:  Allergies  Allergen Reactions  . Demerol [Meperidine] Other (See Comments)    REACTION: unknown--itching  . Epinephrine Other (See Comments)    "Sensitivity."  . Niacin And Related Rash     Review of Systems:  Review of Systems  Constitutional: Negative for chills, fever and malaise/fatigue.  HENT: Negative for congestion, ear pain and sore throat.   Eyes: Negative.   Respiratory: Negative for cough, shortness of breath and wheezing.   Cardiovascular:  Negative for chest pain, palpitations and leg swelling.  Gastrointestinal: Negative for abdominal pain, blood in stool, constipation, diarrhea, heartburn and melena.  Genitourinary: Negative.   Musculoskeletal: Positive for joint pain.  Skin: Negative.   Neurological: Negative for dizziness, sensory change, loss of consciousness and headaches.  Psychiatric/Behavioral: Positive for depression. Negative for substance abuse and suicidal ideas. The patient is nervous/anxious. The patient does not have insomnia.     Family history- Review and unchanged  Social history- Review and unchanged  Physical Exam: BP 118/70   Pulse 72   Temp 98.2 F (36.8 C) (Temporal)   Resp 16   Ht 5'  2" (1.575 m)   Wt 111 lb (50.3 kg)   BMI 20.30 kg/m  Wt Readings from Last 3 Encounters:  08/05/16 111 lb (50.3 kg)  06/30/16 113 lb 9.6 oz (51.5 kg)  05/05/16 117 lb 3.2 oz (53.2 kg)    General Appearance: Well nourished well developed, in no apparent distress. Eyes: PERRLA, EOMs, conjunctiva no swelling or erythema ENT/Mouth: Ear canals normal without obstruction, swelling, erythma, discharge.  TMs normal bilaterally.  Oropharynx moist, clear, without exudate, or postoropharyngeal swelling. Neck: Supple, thyroid normal,no cervical adenopathy  Respiratory: Respiratory effort normal, Breath sounds clear A&P without rhonchi, wheeze, or rale.  No retractions, no accessory usage. Cardio: RRR with no MRGs. Brisk peripheral pulses without edema.  Abdomen: Soft, + BS,  Non tender, no guarding, rebound, hernias, masses. Musculoskeletal: Full ROM, 5/5 strength, Normal gait Skin: Warm, dry without rashes, lesions, ecchymosis.  Neuro: Awake and oriented X 3, Cranial nerves intact. Normal muscle tone, no cerebellar symptoms. Psych:Labile affect, Insight and Judgment appropriate.    Starlyn Skeans, PA-C 11:42 AM [facility]

## 2016-08-05 NOTE — Patient Instructions (Addendum)
Counseling services  Dr. Arbutus Leas, Ph.D. [address], [place] [address] Phone: [phone], [place] [id number] [place] [address], [place] [address]   [facility] Hours: Monday-Thursday 830-8pm  Friday 830AM-7PM Address: [place] [phone].  Address: [place], [place] [address] [facility] [phone] office [email or web address] [address], [place], [address], [place] [address]  Rema Fendt, therapist  Toy Cookey, MA, clinical psychologist  Cognitive-Behavior Therapy; Mood Disorders; Anxiety Disorders; adult and child ADHD; Family Therapy; Stress Management; personal growth, and Marital Therapy.    Terrance Mass Ph.D., clinical psychologist Cognitive-Behavior Therapy; Mood Disorders; Anxiety Disorders; Stress     Management  [facility] [address], [place], [place] [address] [phone]   [facility] [place], psychotherapist [place], [place] [address] [phone]  Karin Golden Ph.D., clinical psychologist [phone] office [place], [place] [address] Cognitive Behavior Therapy, Depression, Bipolar, Anxiety, Grief and Loss    Dr. Adele Schilder [facility] [phone] [address]. [facility]   Dr. Toy Care [address] [place]. [place] [phone]   Cariprazine oral capsules What is this medicine? CARIPRAZINE (car i PRA zeen) is an antipsychotic. It is used to treat schizophrenia or bipolar disorder. Bipolar disorder is also known as manic-depression. This medicine may be used for other purposes; ask your health care provider or pharmacist if you have questions. What should I tell my health care provider before I take this medicine? They need to know if you have any  of these conditions: -dementia -diabetes -difficulty swallowing -heart disease -history of breast cancer -kidney disease -liver disease -low blood counts, like low white cell, platelet, or red cell counts -low blood pressure -Parkinson's disease -seizures -suicidal thoughts, plans, or attempt; a previous suicide attempt by you or a family member -an unusual or allergic reaction to cariprazine, other medicines, foods, dyes, or preservatives -pregnant or trying to get pregnant -breast-feeding How should I use this medicine? Take this medicine by mouth with a glass of water. Follow the directions on the prescription label. You may take it with or without food. Take your medicine at regular intervals. Do not take it more often than directed. Do not stop taking except on your doctor's advice. Talk to your pediatrician regarding the use of this medicine in children. Special care may be needed. Overdosage: If you think you have taken too much of this medicine contact a poison control center or emergency room at once. NOTE: This medicine is only for you. Do not share this medicine with others. What if I miss a dose? If you miss a dose, take it as soon as you can. If it is almost time for your next dose, take only that dose. Do not take double or extra doses. What may interact with this medicine? Do not take this medicine with any of the following medications: -metoclopramide This medicine may also interact with the following medications: -carbamazepine -certain medicines for depression, anxiety, or psychotic disturbances -certain medicines for sleep -itraconazole -ketoconazole -medicines for blood pressure -rifampin -St John's wort This list may not describe all possible interactions. Give your health care provider a list of all the medicines, herbs, non-prescription drugs, or dietary supplements you use. Also tell them if you smoke, drink alcohol, or use illegal drugs. Some items may  interact with your medicine.  What should I watch for while using this medicine? Visit your doctor or health care professional for regular checks on your progress. It may be several weeks before you see the full effects of this medicine. Notify your doctor or health care professional if your symptoms get worse, if you have new symptoms, if you are having an unusual effect from this medicine, or if you feel out of control, very discouraged or think you might harm yourself or others. Do not suddenly stop taking this medicine. You may need to gradually reduce the dose. Ask your doctor or health care professional for advice. You may get dizzy or drowsy. Do not drive, use machinery, or do anything that needs mental alertness until you know how this medicine affects you. Do not stand or sit up quickly, especially if you are an older patient. This reduces the risk of dizzy or fainting spells. Alcohol can increase dizziness and drowsiness. Avoid alcoholic drinks. This medicine may cause dry eyes and blurred vision. If you wear contact lenses you may feel some discomfort. Lubricating drops may help. See your eye doctor if the problem does not go away or is severe. This medicine can reduce the response of your body to heat or cold. Dress warm in cold weather and stay hydrated in hot weather. If possible, avoid extreme temperatures like saunas, hot tubs, very hot or cold showers, or activities that can cause dehydration such as vigorous exercise. Women should inform their doctor if they wish to become pregnant or think they might be pregnant. The effects of this medicine on an unborn child are not known. A registry is available to monitor pregnancy outcomes in pregnant women exposed to this medicine or similar medicines. Talk to your health care professional or pharmacist for more information. What side effects may I notice from receiving this medicine? Side effects that you should report to your doctor or health care  professional as soon as possible: -allergic reactions like skin rash, itching or hives, swelling of the face, lips, or tongue -changes in emotions or moods -confusion -difficulty swallowing -feeling faint or lightheaded, falls -fever or chills, sore throat -inability to control muscle movements in the face, mouth, hands, arms, or legs -increased hunger or thirst -increased urination -missed or irregular menstrual periods -problems with balance, talking, walking -seizures -stiff muscles -suicidal thoughts or other mood changes -unusually weak or tired Side effects that usually do not require medical attention (Report these to your doctor or health care professional if they continue or are bothersome.): -constipation -dizziness -drowsiness -nausea, vomiting -restlessness -upset stomach -weight gain This list may not describe all possible side effects. Call your doctor for medical advice about side effects. You may report side effects to FDA at 1-800-FDA-1088. Where should I keep my medicine? Keep out of the reach of children. Store at room temperature between 15 and 30 degrees C (59 and 86 degrees F). Protect from light. Throw away any unused medicine after the expiration date. NOTE: This sheet is a summary. It may not cover all possible information. If you have questions about this medicine, talk to your doctor, pharmacist, or health care provider.    2016, [employer]. (2015-01-15 07:16:35)

## 2016-08-06 LAB — HEMOGLOBIN A1C
Hgb A1c MFr Bld: 5.5 % (ref <5.7)
Mean Plasma Glucose: 111 mg/dL

## 2016-09-01 LAB — HM MAMMOGRAPHY

## 2016-09-07 ENCOUNTER — Telehealth: Payer: Self-pay | Admitting: *Deleted

## 2016-09-07 ENCOUNTER — Other Ambulatory Visit: Payer: Self-pay | Admitting: Physician Assistant

## 2016-09-07 MED ORDER — DULOXETINE HCL 30 MG PO CPEP: 30.0000 mg | ORAL_CAPSULE | Freq: Every day | ORAL | 1 refills | Status: DC

## 2016-09-07 NOTE — Telephone Encounter (Signed)
Patient was advised at her last ov 08/05/2016 to increase Cymbalta d/t severe anxiety and depression.  Patient was advised to take 60 mg BID.  Patient called today to state she is unable to tolerate that high of a dose.  Patient requesting we lower to 90 mg.  She has plenty of Cymbalta 60 mg at home and asks if we can send in 30 mg to take in conjunction with the 60 mg to equal 90 mg.  Per Starlyn Skeans, PA-C ok to send in 30 mg Cymbalta and patient will continue to monitor her depression and anxiety symptoms.

## 2016-09-30 ENCOUNTER — Other Ambulatory Visit: Payer: Self-pay | Admitting: Internal Medicine

## 2016-10-06 ENCOUNTER — Encounter: Payer: Self-pay | Admitting: Physician Assistant

## 2016-11-09 ENCOUNTER — Ambulatory Visit (INDEPENDENT_AMBULATORY_CARE_PROVIDER_SITE_OTHER): Payer: Medicare Other | Admitting: Internal Medicine

## 2016-11-09 VITALS — BP 126/76 | HR 72 | Temp 97.6°F | Ht 62.0 in | Wt 115.8 lb

## 2016-11-09 DIAGNOSIS — Z23 Encounter for immunization: Secondary | ICD-10-CM | POA: Diagnosis not present

## 2016-11-09 DIAGNOSIS — Z79899 Other long term (current) drug therapy: Secondary | ICD-10-CM

## 2016-11-09 DIAGNOSIS — K219 Gastro-esophageal reflux disease without esophagitis: Secondary | ICD-10-CM

## 2016-11-09 DIAGNOSIS — E782 Mixed hyperlipidemia: Secondary | ICD-10-CM

## 2016-11-09 DIAGNOSIS — E559 Vitamin D deficiency, unspecified: Secondary | ICD-10-CM

## 2016-11-09 DIAGNOSIS — R7303 Prediabetes: Secondary | ICD-10-CM | POA: Diagnosis not present

## 2016-11-09 DIAGNOSIS — I10 Essential (primary) hypertension: Secondary | ICD-10-CM | POA: Diagnosis not present

## 2016-11-09 LAB — CBC WITH DIFFERENTIAL/PLATELET
Basophils Absolute: 0 cells/uL (ref 0–200)
Basophils Relative: 0 %
Eosinophils Absolute: 120 cells/uL (ref 15–500)
Eosinophils Relative: 2 %
HCT: 37.9 % (ref 35.0–45.0)
Hemoglobin: 12.2 g/dL (ref 11.7–15.5)
Lymphocytes Relative: 33 %
Lymphs Abs: 1980 cells/uL (ref 850–3900)
MCH: 28.4 pg (ref 27.0–33.0)
MCHC: 32.2 g/dL (ref 32.0–36.0)
MCV: 88.3 fL (ref 80.0–100.0)
MPV: 9.1 fL (ref 7.5–12.5)
Monocytes Absolute: 420 cells/uL (ref 200–950)
Monocytes Relative: 7 %
Neutro Abs: 3480 cells/uL (ref 1500–7800)
Neutrophils Relative %: 58 %
Platelets: 274 10*3/uL (ref 140–400)
RBC: 4.29 MIL/uL (ref 3.80–5.10)
RDW: 15 % (ref 11.0–15.0)
WBC: 6 10*3/uL (ref 3.8–10.8)

## 2016-11-09 LAB — TSH: TSH: 1.17 mIU/L

## 2016-11-09 NOTE — Progress Notes (Signed)
[facility] [place] [facility] Unk Pinto, M.D.        Uvaldo Bristle. Silverio Lay, P.A.-C       Starlyn Skeans, P.A.-C   [facility]                [address] [place], [place] [id number] Telephone [phone] Telefax [phone] ______________________________________________________________________     This very nice 60 y.o. MWF presents for 6 month follow up with Hypertension, Hyperlipidemia, Pre-Diabetes and Vitamin D Deficiency. Patient also has Chronic Pain Syndrome related to Cx DDD and Cx HNP surg in 2011. Then in 2013 she underwent L5/S1 Surg by Dr Ellene Route and again in Nov 2016 had Lumbar fusion. Patient also has hx/o GERD controlled with diet and her medications.     Patient is treated for HTN circa 2007 & BP has been controlled at home. Today's BP is at goal - 126/76. Patient has had no complaints of any cardiac type chest pain, palpitations, dyspnea/orthopnea/PND, dizziness, claudication, or dependent edema.     Hyperlipidemia is controlled with diet & meds. Patient denies myalgias or other med SE's. Last Lipids were not at goal: Lab Results  Component Value Date   CHOL 191 08/05/2016   HDL 44 (L) 08/05/2016   LDLCALC 121 08/05/2016   TRIG 130 08/05/2016   CHOLHDL 4.3 08/05/2016      Also, the patient has history of PreDiabetes with A1c 5.9% circa 2009 and then in 2014 A1c was 6.3%. She denies  symptoms of reactive hypoglycemia, diabetic polys, paresthesias or visual blurring.  Last A1c was at goal: Lab Results  Component Value Date   HGBA1C 5.5 08/05/2016      Patient has been on Thyroid Replacement since 2009. Further, the patient also has history of Vitamin D Deficiency in 2009 of "29"  and supplements vitamin D without any suspected side-effects. Last vitamin D was at goal:  Lab Results  Component Value Date   VD25OH 59 05/05/2016   Current Outpatient Prescriptions on File Prior to Visit  Medication Sig   . albuterol (PROVENTIL HFA;VENTOLIN HFA) 108 (90 Base) MCG/ACT inhaler Inhale 2 puffs into the lungs every 4 (four) hours as needed for wheezing or shortness of breath.  Marland Kitchen atorvastatin (LIPITOR) 10 MG tablet TAKE 1 TABLET BY MOUTH EVERY DAY  . Cholecalciferol (VITAMIN D PO) Take 4,000 Units by mouth daily.   Marland Kitchen CINNAMON PO Take 1,000 Units by mouth 2 (two) times daily.   . clonazePAM (KLONOPIN) 0.5 MG tablet TAKE 1 TABLET BY MOUTH TWICE DAILY  . Cyanocobalamin (VITAMIN B-12) 2500 MCG SUBL Place 1 tablet under the tongue daily.   . diclofenac sodium (VOLTAREN) 1 % GEL Apply 4 g topically 4 (four) times daily.  . DULoxetine (CYMBALTA) 30 MG capsule Take 1 capsule (30 mg total) by mouth daily.  . DULoxetine (CYMBALTA) 60 MG capsule Take 1 capsule (60 mg total) by mouth 2 (two) times daily.  . enalapril (VASOTEC) 20 MG tablet TAKE 1 TABLET BY MOUTH DAILY  . fexofenadine-pseudoephedrine (ALLEGRA-D 12 HOUR) 60-120 MG per tablet Take 1 tablet by mouth daily.  . fluconazole (DIFLUCAN) 150 MG tablet Take 1 tablet weekly for yeast infection  . fluticasone (FLONASE) 50 MCG/ACT nasal spray SHAKE WELL AND USE 2 SPRAYS IN EACH NOSTRIL EVERY EVENING  . fluticasone-salmeterol (ADVAIR HFA) 115-21 MCG/ACT inhaler 2 inhalations 10-15 minutes apart every 12 hours  . gabapentin (  NEURONTIN) 300 MG capsule Take 300 mg by mouth 3 (three) times daily.   Marland Kitchen levothyroxine (SYNTHROID, LEVOTHROID) 50 MCG tablet TAKE 1 TABLET(50 MCG) BY MOUTH DAILY  . Magnesium 250 MG TABS Take 2 tablets by mouth daily.   . meloxicam (MOBIC) 15 MG tablet TAKE 1 TABLET(15 MG) BY MOUTH DAILY  . montelukast (SINGULAIR) 10 MG tablet TAKE 1 TABLET(10 MG) BY MOUTH AT BEDTIME  . pantoprazole (PROTONIX) 40 MG tablet Take 40 mg by mouth 2 (two) times daily.   . promethazine (PHENERGAN) 25 MG tablet Take 1 tablet (25 mg total) by mouth every 6 (six) hours as needed for nausea or vomiting.  . rizatriptan (MAXALT-MLT) 10 MG disintegrating tablet Take 1  tablet (10 mg total) by mouth as needed for migraine. May repeat in 2 hours if needed  . traMADol (ULTRAM) 50 MG tablet 1 pill twice daily as needed for pain   No current facility-administered medications on file prior to visit.    Allergies  Allergen Reactions  . Demerol [Meperidine] Other (See Comments)    REACTION: unknown--itching  . Epinephrine Other (See Comments)    "Sensitivity."  . Niacin And Related Rash   PMHx:   Past Medical History:  Diagnosis Date  . Asthma    Dulera daily  . Chronic back pain    HNP  . Complication of anesthesia    slow to wake up  . Depression    takes Clonazepam nightly  . GERD (gastroesophageal reflux disease)    takes Protonix daily   . Heart murmur   . History of migraine    takes Relpax daily as needed  . HSV (herpes simplex virus) infection   . Hyperlipidemia    takes Atorvastatin daily  . Hypertension    takes Benicar daily  . Hypothyroidism    takes Synthroid daily  . MVP (mitral valve prolapse)   . Pneumonia    hx of--been many yrs ago  . Seasonal allergies    takes Allegra daily  . Shortness of breath    with exertion   Immunization History  Administered Date(s) Administered  . Influenza-Unspecified 09/18/2013  . PPD Test 04/17/2014  . Pneumococcal-Unspecified 11/30/1995  . Td 08/27/2005, 05/05/2016   Past Surgical History:  Procedure Laterality Date  . ABLATION  2011  . buttocks surgery     at age 30  . CERVICAL FUSION  03/2010  . CESAREAN SECTION  1992  . CHOLECYSTECTOMY  2008  . COLONOSCOPY    . DILATION AND CURETTAGE OF UTERUS  2011  . ESOPHAGOGASTRODUODENOSCOPY    . LUMBAR LAMINECTOMY/DECOMPRESSION MICRODISCECTOMY  10/24/2012   Procedure: LUMBAR LAMINECTOMY/DECOMPRESSION MICRODISCECTOMY 1 LEVEL;  Surgeon: Kristeen Miss, MD;  Location: [facility];  Service: Neurosurgery;  Laterality: Right;  Right Lumbar five-Sacral One Microdiskectomy  . right knee arthroscopy    . TONSILLECTOMY     at age 89   FHx:     Reviewed / unchanged  SHx:    Reviewed / unchanged  Systems Review:  Constitutional: Denies fever, chills, wt changes, headaches, insomnia, fatigue, night sweats, change in appetite. Eyes: Denies redness, blurred vision, diplopia, discharge, itchy, watery eyes.  ENT: Denies discharge, congestion, post nasal drip, epistaxis, sore throat, earache, hearing loss, dental pain, tinnitus, vertigo, sinus pain, snoring.  CV: Denies chest pain, palpitations, irregular heartbeat, syncope, dyspnea, diaphoresis, orthopnea, PND, claudication or edema. Respiratory: denies cough, dyspnea, DOE, pleurisy, hoarseness, laryngitis, wheezing.  Gastrointestinal: Denies dysphagia, odynophagia, heartburn, reflux, water brash, abdominal pain or  cramps, nausea, vomiting, bloating, diarrhea, constipation, hematemesis, melena, hematochezia  or hemorrhoids. Genitourinary: Denies dysuria, frequency, urgency, nocturia, hesitancy, discharge, hematuria or flank pain. Musculoskeletal: Denies arthralgias, myalgias, stiffness, jt. swelling, pain, limping or strain/sprain.  Skin: Denies pruritus, rash, hives, warts, acne, eczema or change in skin lesion(s). Neuro: No weakness, tremor, incoordination, spasms, paresthesia or pain. Psychiatric: Denies confusion, memory loss or sensory loss. Endo: Denies change in weight, skin or hair change.  Heme/Lymph: No excessive bleeding, bruising or enlarged lymph nodes.  Physical Exam BP 126/76   Pulse 72   Temp 97.6 F (36.4 C)   Ht 5\' 2"  (1.575 m)   Wt 115 lb 12.8 oz (52.5 kg)   BMI 21.18 kg/m   Appears well nourished and in no distress.  Eyes: PERRLA, EOMs, conjunctiva no swelling or erythema. Sinuses: No frontal/maxillary tenderness ENT/Mouth: EAC's clear, TM's nl w/o erythema, bulging. Nares clear w/o erythema, swelling, exudates. Oropharynx clear without erythema or exudates. Oral hygiene is good. Tongue normal, non obstructing. Hearing intact.  Neck: Supple. Thyroid nl. Car  2+/2+ without bruits, nodes or JVD. Chest: Respirations nl with BS clear & equal w/o rales, rhonchi, wheezing or stridor.  Cor: Heart sounds normal w/ regular rate and rhythm without sig. murmurs, gallops, clicks, or rubs. Peripheral pulses normal and equal  without edema.  Abdomen: Soft & bowel sounds normal. Non-tender w/o guarding, rebound, hernias, masses, or organomegaly.  Lymphatics: Unremarkable.  Musculoskeletal: Full ROM all peripheral extremities, joint stability, 5/5 strength, and normal gait.  Skin: Warm, dry without exposed rashes, lesions or ecchymosis apparent.  Neuro: Cranial nerves intact, reflexes equal bilaterally. Sensory-motor testing grossly intact. Tendon reflexes grossly intact.  Pysch: Alert & oriented x 3.  Insight and judgement nl & appropriate. No ideations.  Assessment and Plan:   1. Essential hypertension  - Continue medication, monitor blood pressure at home.  - Continue DASH diet. Reminder to go to the ER if any CP,  SOB, nausea, dizziness, severe HA, changes vision/speech,  left arm numbness and tingling and jaw pain. - CBC with Differential/Platelet - BASIC METABOLIC PANEL WITH GFR - TSH  2. Mixed hyperlipidemia  - Continue diet/meds, exercise,& lifestyle modifications. - Continue monitor periodic cholesterol/liver & renal functions  - Hepatic function panel - Lipid panel - TSH  3. Prediabetes  - Continue diet, exercise, lifestyle modifications.  - Monitor appropriate labs. - Hemoglobin A1c - Insulin, random  4. Vitamin D deficiency  - Continue supplementation. - VITAMIN D 25 Hydroxy   5. Gastroesophageal reflux disease   6. Medication management  - CBC with Differential/Platelet - BASIC METABOLIC PANEL WITH GFR - Hepatic function panel - Magnesium  7. Need for prophylactic vaccination and inoculation against influenza  - Flu Vaccine QUAD with presevative        Recommended regular exercise, BP monitoring, weight control, and  discussed med and SE's. Recommended labs to assess and monitor clinical status. Further disposition pending results of labs. Over 30 minutes of exam, counseling, chart review was performed

## 2016-11-09 NOTE — Patient Instructions (Signed)

## 2016-11-10 LAB — BASIC METABOLIC PANEL WITH GFR
BUN: 15 mg/dL (ref 7–25)
CO2: 26 mmol/L (ref 20–31)
Calcium: 9.4 mg/dL (ref 8.6–10.4)
Chloride: 105 mmol/L (ref 98–110)
Creat: 0.96 mg/dL (ref 0.50–1.05)
GFR, Est African American: 75 mL/min (ref >=60)
GFR, Est Non African American: 65 mL/min (ref >=60)
Glucose, Bld: 135 mg/dL — ABNORMAL HIGH (ref 65–99)
Potassium: 4.2 mmol/L (ref 3.5–5.3)
Sodium: 142 mmol/L (ref 135–146)

## 2016-11-10 LAB — MAGNESIUM: Magnesium: 1.8 mg/dL (ref 1.5–2.5)

## 2016-11-10 LAB — LIPID PANEL
Cholesterol: 203 mg/dL — ABNORMAL HIGH (ref <200)
HDL: 41 mg/dL — ABNORMAL LOW (ref >50)
LDL Cholesterol: 129 mg/dL — ABNORMAL HIGH (ref <100)
Total CHOL/HDL Ratio: 5 Ratio — ABNORMAL HIGH (ref <5.0)
Triglycerides: 166 mg/dL — ABNORMAL HIGH (ref <150)
VLDL: 33 mg/dL — ABNORMAL HIGH (ref <30)

## 2016-11-10 LAB — VITAMIN D 25 HYDROXY (VIT D DEFICIENCY, FRACTURES): Vit D, 25-Hydroxy: 69 ng/mL (ref 30–100)

## 2016-11-10 LAB — HEPATIC FUNCTION PANEL
ALT: 12 U/L (ref 6–29)
AST: 16 U/L (ref 10–35)
Albumin: 4.3 g/dL (ref 3.6–5.1)
Alkaline Phosphatase: 54 U/L (ref 33–130)
Bilirubin, Direct: 0.1 mg/dL (ref <=0.2)
Indirect Bilirubin: 0.5 mg/dL (ref 0.2–1.2)
Total Bilirubin: 0.6 mg/dL (ref 0.2–1.2)
Total Protein: 6.6 g/dL (ref 6.1–8.1)

## 2016-11-10 LAB — INSULIN, RANDOM: Insulin: 51.8 u[IU]/mL — ABNORMAL HIGH (ref 2.0–19.6)

## 2016-11-10 LAB — HEMOGLOBIN A1C
Hgb A1c MFr Bld: 5.7 % — ABNORMAL HIGH (ref <5.7)
Mean Plasma Glucose: 117 mg/dL

## 2016-11-13 ENCOUNTER — Encounter: Payer: Self-pay | Admitting: Internal Medicine

## 2016-11-17 ENCOUNTER — Other Ambulatory Visit: Payer: Self-pay | Admitting: Internal Medicine

## 2016-11-17 MED ORDER — BENZONATATE 200 MG PO CAPS: ORAL_CAPSULE | ORAL | 0 refills | Status: DC

## 2016-11-17 MED ORDER — AZITHROMYCIN 250 MG PO TABS: ORAL_TABLET | ORAL | 0 refills | Status: DC

## 2016-11-17 MED ORDER — PREDNISONE 10 MG PO TABS: ORAL_TABLET | ORAL | 0 refills | Status: DC

## 2016-11-18 ENCOUNTER — Other Ambulatory Visit: Payer: Self-pay | Admitting: *Deleted

## 2016-11-18 MED ORDER — PROMETHAZINE-DM 6.25-15 MG/5ML PO SYRP: 5.0000 mL | ORAL_SOLUTION | Freq: Three times a day (TID) | ORAL | 0 refills | Status: DC | PRN

## 2016-11-26 ENCOUNTER — Telehealth: Payer: Self-pay | Admitting: *Deleted

## 2016-11-26 MED ORDER — FLUCONAZOLE 150 MG PO TABS: ORAL_TABLET | ORAL | 0 refills | Status: DC

## 2016-11-26 NOTE — Telephone Encounter (Signed)
Patient called and states she has developed a vaginal yeast infection due to recent antibiotics. Per Dr Melford Aase, send in an RX for Carlin.  Patient was advised.

## 2017-01-02 ENCOUNTER — Other Ambulatory Visit: Payer: Self-pay | Admitting: Internal Medicine

## 2017-01-10 ENCOUNTER — Other Ambulatory Visit: Payer: Self-pay | Admitting: Internal Medicine

## 2017-01-10 ENCOUNTER — Other Ambulatory Visit: Payer: Self-pay | Admitting: *Deleted

## 2017-01-10 MED ORDER — CLONAZEPAM 0.5 MG PO TABS: ORAL_TABLET | ORAL | 0 refills | Status: DC

## 2017-01-19 DIAGNOSIS — M5416 Radiculopathy, lumbar region: Secondary | ICD-10-CM | POA: Diagnosis not present

## 2017-01-24 ENCOUNTER — Other Ambulatory Visit: Payer: Self-pay | Admitting: *Deleted

## 2017-01-24 DIAGNOSIS — R110 Nausea: Secondary | ICD-10-CM

## 2017-01-24 MED ORDER — PROMETHAZINE HCL 25 MG PO TABS: 25.0000 mg | ORAL_TABLET | ORAL | 0 refills | Status: DC | PRN

## 2017-02-07 ENCOUNTER — Encounter: Payer: Self-pay | Admitting: Internal Medicine

## 2017-02-07 ENCOUNTER — Ambulatory Visit (INDEPENDENT_AMBULATORY_CARE_PROVIDER_SITE_OTHER): Payer: PPO | Admitting: Internal Medicine

## 2017-02-07 VITALS — BP 132/64 | HR 72 | Temp 98.2°F | Resp 12 | Ht 62.0 in | Wt 116.0 lb

## 2017-02-07 DIAGNOSIS — I10 Essential (primary) hypertension: Secondary | ICD-10-CM | POA: Diagnosis not present

## 2017-02-07 DIAGNOSIS — E782 Mixed hyperlipidemia: Secondary | ICD-10-CM | POA: Diagnosis not present

## 2017-02-07 DIAGNOSIS — R7303 Prediabetes: Secondary | ICD-10-CM

## 2017-02-07 DIAGNOSIS — E039 Hypothyroidism, unspecified: Secondary | ICD-10-CM | POA: Diagnosis not present

## 2017-02-07 DIAGNOSIS — E559 Vitamin D deficiency, unspecified: Secondary | ICD-10-CM

## 2017-02-07 DIAGNOSIS — Z79899 Other long term (current) drug therapy: Secondary | ICD-10-CM

## 2017-02-07 DIAGNOSIS — K219 Gastro-esophageal reflux disease without esophagitis: Secondary | ICD-10-CM

## 2017-02-07 DIAGNOSIS — L309 Dermatitis, unspecified: Secondary | ICD-10-CM

## 2017-02-07 LAB — CBC WITH DIFFERENTIAL/PLATELET
Basophils Absolute: 57 cells/uL (ref 0–200)
Basophils Relative: 1 %
Eosinophils Absolute: 114 cells/uL (ref 15–500)
Eosinophils Relative: 2 %
HCT: 36.2 % (ref 35.0–45.0)
Hemoglobin: 11.8 g/dL (ref 11.7–15.5)
Lymphocytes Relative: 36 %
Lymphs Abs: 2052 cells/uL (ref 850–3900)
MCH: 28.8 pg (ref 27.0–33.0)
MCHC: 32.6 g/dL (ref 32.0–36.0)
MCV: 88.3 fL (ref 80.0–100.0)
MPV: 9.5 fL (ref 7.5–12.5)
Monocytes Absolute: 456 cells/uL (ref 200–950)
Monocytes Relative: 8 %
Neutro Abs: 3021 cells/uL (ref 1500–7800)
Neutrophils Relative %: 53 %
Platelets: 280 10*3/uL (ref 140–400)
RBC: 4.1 MIL/uL (ref 3.80–5.10)
RDW: 14.5 % (ref 11.0–15.0)
WBC: 5.7 10*3/uL (ref 3.8–10.8)

## 2017-02-07 LAB — LIPID PANEL
Cholesterol: 155 mg/dL (ref <200)
HDL: 39 mg/dL — ABNORMAL LOW (ref >50)
LDL Cholesterol: 92 mg/dL (ref <100)
Total CHOL/HDL Ratio: 4 Ratio (ref <5.0)
Triglycerides: 120 mg/dL (ref <150)
VLDL: 24 mg/dL (ref <30)

## 2017-02-07 LAB — BASIC METABOLIC PANEL WITH GFR
BUN: 20 mg/dL (ref 7–25)
CO2: 30 mmol/L (ref 20–31)
Calcium: 9.1 mg/dL (ref 8.6–10.4)
Chloride: 105 mmol/L (ref 98–110)
Creat: 0.85 mg/dL (ref 0.50–1.05)
GFR, Est African American: 87 mL/min (ref >=60)
GFR, Est Non African American: 75 mL/min (ref >=60)
Glucose, Bld: 96 mg/dL (ref 65–99)
Potassium: 4 mmol/L (ref 3.5–5.3)
Sodium: 142 mmol/L (ref 135–146)

## 2017-02-07 LAB — HEPATIC FUNCTION PANEL
ALT: 17 U/L (ref 6–29)
AST: 21 U/L (ref 10–35)
Albumin: 4.5 g/dL (ref 3.6–5.1)
Alkaline Phosphatase: 66 U/L (ref 33–130)
Bilirubin, Direct: 0.1 mg/dL (ref <=0.2)
Indirect Bilirubin: 0.2 mg/dL (ref 0.2–1.2)
Total Bilirubin: 0.3 mg/dL (ref 0.2–1.2)
Total Protein: 6.7 g/dL (ref 6.1–8.1)

## 2017-02-07 LAB — TSH: TSH: 1.12 mIU/L

## 2017-02-07 MED ORDER — FLUOCINONIDE 0.05 % EX CREA: TOPICAL_CREAM | CUTANEOUS | 3 refills | Status: DC

## 2017-02-07 NOTE — Patient Instructions (Signed)

## 2017-02-07 NOTE — Progress Notes (Signed)
This very nice  MWF presents for 3 month follow up with Hypertension, Hyperlipidemia, Pre-Diabetes and Vitamin D Deficiency.      Patient also has Chronic Pain Syndrome consequent of Cx DDD and Cx HNP surg in 2011. Then in 2013 she underwent L5/S1 Surg by Dr Ellene Route and again in Nov 2016 had Lumbar fusion. Patient also has hx/o GERD controlled with diet and her medications.     Patient is treated for HTN (2007)& BP has been controlled at home. Today's BP is at goal - 132/64. Patient has had no complaints of any cardiac type chest pain, palpitations, dyspnea/orthopnea/PND, dizziness, claudication, or dependent edema.     Hyperlipidemia is not  controlled with diet & meds. Patient denies myalgias or other med SE's. Last Lipids were not at goal: Lab Results  Component Value Date   CHOL 203 (H) 11/09/2016   HDL 41 (L) 11/09/2016   LDLCALC 129 (H) 11/09/2016   TRIG 166 (H) 11/09/2016   CHOLHDL 5.0 (H) 11/09/2016      Patient has been on Thyroid Replacement since 2009. Also, the patient has history of PreDiabetes (A1c 5.9% in 2009) and has had no symptoms of reactive hypoglycemia, diabetic polys, paresthesias or visual blurring.  Last A1c was  Lab Results  Component Value Date   HGBA1C 5.7 (H) 11/09/2016      Further, the patient also has history of Vitamin D Deficiency ("29" in 2009)  and supplements vitamin D without any suspected side-effects. Last vitamin D was at goal:  Lab Results  Component Value Date   VD25OH 69 11/09/2016   Current Outpatient Prescriptions on File Prior to Visit  Medication Sig  . albuterol (PROVENTIL HFA;VENTOLIN HFA) 108 (90 Base) MCG/ACT inhaler Inhale 2 puffs into the lungs every 4 (four) hours as needed for wheezing or shortness of breath.  Marland Kitchen atorvastatin (LIPITOR) 10 MG tablet TAKE 1 TABLET BY MOUTH EVERY DAY  . Cholecalciferol (VITAMIN D PO) Take 4,000 Units by mouth daily.   Marland Kitchen CINNAMON PO Take 1,000 Units by mouth 2 (two) times daily.   . clonazePAM  (KLONOPIN) 0.5 MG tablet Take 1/2 to 1 tablet up to 2  X / day only if needed for anxiety attack or sleep  . Cyanocobalamin (VITAMIN B-12) 2500 MCG SUBL Place 1 tablet under the tongue daily.   . diclofenac sodium (VOLTAREN) 1 % GEL Apply 4 g topically 4 (four) times daily.  . DULoxetine (CYMBALTA) 30 MG capsule Take 1 capsule (30 mg total) by mouth daily.  . DULoxetine (CYMBALTA) 60 MG capsule Take 1 capsule (60 mg total) by mouth 2 (two) times daily.  . enalapril (VASOTEC) 20 MG tablet TAKE 1 TABLET BY MOUTH DAILY  . fexofenadine-pseudoephedrine (ALLEGRA-D 12 HOUR) 60-120 MG per tablet Take 1 tablet by mouth daily.  . fluconazole (DIFLUCAN) 150 MG tablet Take 1 tablet by mouth 2 times a week.  . fluticasone (FLONASE) 50 MCG/ACT nasal spray SHAKE WELL AND USE 2 SPRAYS IN EACH NOSTRIL EVERY EVENING  . fluticasone-salmeterol (ADVAIR HFA) 115-21 MCG/ACT inhaler 2 inhalations 10-15 minutes apart every 12 hours  . gabapentin (NEURONTIN) 300 MG capsule Take 300 mg by mouth 3 (three) times daily.   Marland Kitchen levothyroxine (SYNTHROID, LEVOTHROID) 50 MCG tablet TAKE 1 TABLET(50 MCG) BY MOUTH DAILY  . Magnesium 250 MG TABS Take 2 tablets by mouth daily.   . meloxicam (MOBIC) 15 MG tablet TAKE 1 TABLET(15 MG) BY MOUTH DAILY  . montelukast (SINGULAIR) 10 MG tablet  TAKE 1 TABLET(10 MG) BY MOUTH AT BEDTIME  . pantoprazole (PROTONIX) 40 MG tablet Take 40 mg by mouth 2 (two) times daily.   . promethazine (PHENERGAN) 25 MG tablet Take 1 tablet (25 mg total) by mouth every 4 (four) hours as needed for nausea or vomiting.  . rizatriptan (MAXALT-MLT) 10 MG disintegrating tablet Take 1 tablet (10 mg total) by mouth as needed for migraine. May repeat in 2 hours if needed  . traMADol (ULTRAM) 50 MG tablet 1 pill twice daily as needed for pain   No current facility-administered medications on file prior to visit.    Allergies  Allergen Reactions  . Demerol [Meperidine] Other (See Comments)    REACTION: unknown--itching   . Epinephrine Other (See Comments)    "Sensitivity."  . Niacin And Related Rash   PMHx:   Past Medical History:  Diagnosis Date  . Asthma    Dulera daily  . Chronic back pain    HNP  . Complication of anesthesia    slow to wake up  . Depression    takes Clonazepam nightly  . GERD (gastroesophageal reflux disease)    takes Protonix daily   . Heart murmur   . History of migraine    takes Relpax daily as needed  . HSV (herpes simplex virus) infection   . Hyperlipidemia    takes Atorvastatin daily  . Hypertension    takes Benicar daily  . Hypothyroidism    takes Synthroid daily  . MVP (mitral valve prolapse)   . Pneumonia    hx of--been many yrs ago  . Seasonal allergies    takes Allegra daily  . Shortness of breath    with exertion   Immunization History  Administered Date(s) Administered  . Influenza,inj,quad, With Preservative 11/09/2016  . Influenza-Unspecified 09/18/2013  . PPD Test 04/17/2014  . Pneumococcal-Unspecified 11/30/1995  . Td 08/27/2005, 05/05/2016   Past Surgical History:  Procedure Laterality Date  . ABLATION  2011  . buttocks surgery     at age 75  . CERVICAL FUSION  03/2010  . CESAREAN SECTION  1992  . CHOLECYSTECTOMY  2008  . COLONOSCOPY    . DILATION AND CURETTAGE OF UTERUS  2011  . ESOPHAGOGASTRODUODENOSCOPY    . LUMBAR LAMINECTOMY/DECOMPRESSION MICRODISCECTOMY  10/24/2012   Procedure: LUMBAR LAMINECTOMY/DECOMPRESSION MICRODISCECTOMY 1 LEVEL;  Surgeon: Kristeen Miss, MD;  Location: [facility];  Service: Neurosurgery;  Laterality: Right;  Right Lumbar five-Sacral One Microdiskectomy  . right knee arthroscopy    . TONSILLECTOMY     at age 68   FHx:    Reviewed / unchanged  SHx:    Reviewed / unchanged  Systems Review:  Constitutional: Denies fever, chills, wt changes, headaches, insomnia, fatigue, night sweats, change in appetite. Eyes: Denies redness, blurred vision, diplopia, discharge, itchy, watery eyes.  ENT: Denies discharge,  congestion, post nasal drip, epistaxis, sore throat, earache, hearing loss, dental pain, tinnitus, vertigo, sinus pain, snoring.  CV: Denies chest pain, palpitations, irregular heartbeat, syncope, dyspnea, diaphoresis, orthopnea, PND, claudication or edema. Respiratory: denies cough, dyspnea, DOE, pleurisy, hoarseness, laryngitis, wheezing.  Gastrointestinal: Denies dysphagia, odynophagia, heartburn, reflux, water brash, abdominal pain or cramps, nausea, vomiting, bloating, diarrhea, constipation, hematemesis, melena, hematochezia  or hemorrhoids. Genitourinary: Denies dysuria, frequency, urgency, nocturia, hesitancy, discharge, hematuria or flank pain. Musculoskeletal: Denies arthralgias, myalgias, stiffness, jt. swelling, pain, limping or strain/sprain.  Skin: Denies pruritus, rash, hives, warts, acne, eczema or change in skin lesion(s). Neuro: No weakness, tremor, incoordination, spasms, paresthesia or  pain. Psychiatric: Denies confusion, memory loss or sensory loss. Endo: Denies change in weight, skin or hair change.  Heme/Lymph: No excessive bleeding, bruising or enlarged lymph nodes.  Physical Exam  BP 132/64   P 72   T 98.2 F    R 12   Ht 5\' 2"     Wt 116 lb    BMI 21.22  Appears well nourished and in no distress.  Eyes: PERRLA, EOMs, conjunctiva no swelling or erythema. Sinuses: No frontal/maxillary tenderness ENT/Mouth: EAC's clear, TM's nl w/o erythema, bulging. Nares clear w/o erythema, swelling, exudates. Oropharynx clear without erythema or exudates. Oral hygiene is good. Tongue normal, non obstructing. Hearing intact.  Neck: Supple. Thyroid nl. Car 2+/2+ without bruits, nodes or JVD. Chest: Respirations nl with BS clear & equal w/o rales, rhonchi, wheezing or stridor.  Cor: Heart sounds normal w/ regular rate and rhythm without sig. murmurs, gallops, clicks, or rubs. Peripheral pulses normal and equal  without edema.  Abdomen: Soft & bowel sounds normal. Non-tender w/o  guarding, rebound, hernias, masses, or organomegaly.  Lymphatics: Unremarkable.  Musculoskeletal: Full ROM all peripheral extremities, joint stability, 5/5 strength, and normal gait.  Skin: Warm, dry without exposed rashes, lesions or ecchymosis apparent.  Neuro: Cranial nerves intact, reflexes equal bilaterally. Sensory-motor testing grossly intact. Tendon reflexes grossly intact.  Pysch: Alert & oriented x 3.  Insight and judgement nl & appropriate. No ideations.  Assessment and Plan:  1. Essential hypertension  - Continue medication, monitor blood pressure at home.  - Continue DASH diet. Reminder to go to the ER if any CP,  SOB, nausea, dizziness, severe HA, changes vision/speech,  left arm numbness and tingling and jaw pain. - CBC with Differential/Platelet - BASIC METABOLIC PANEL WITH GFR - Magnesium - TSH  2. Mixed hyperlipidemia  - Continue diet/meds, exercise,& lifestyle modifications.  - Continue monitor periodic cholesterol/liver & renal functions  - Hepatic function panel - Lipid panel - TSH  3. Prediabetes  - Continue diet, exercise, lifestyle modifications.  - Monitor appropriate labs. - Insulin, random  4. Vitamin D deficiency  - Continue supplementation. - VITAMIN D 25 Hydroxy   5. Hypothyroidism  - TSH  6. Gastroesophageal reflux disease   7. Medication management  - CBC with Differential/Platelet - BASIC METABOLIC PANEL WITH GFR - Hepatic function panel - Magnesium - Lipid panel - TSH - Hemoglobin A1c - Insulin, random - VITAMIN D 25 Hydroxy   8. Eczema  - fluocinonide cream (LIDEX) 0.05 %; Apply to rash 2 x / day as needed  Dispense: 60 g; Refill: 3        Recommended regular exercise, BP monitoring, weight control, and discussed med and SE's. Recommended labs to assess and monitor clinical status. Further disposition pending results of labs. Over 30 minutes of exam, counseling, chart review was performed

## 2017-02-08 LAB — HEMOGLOBIN A1C
Hgb A1c MFr Bld: 5.8 % — ABNORMAL HIGH (ref <5.7)
Mean Plasma Glucose: 120 mg/dL

## 2017-02-08 LAB — MAGNESIUM: Magnesium: 2.1 mg/dL (ref 1.5–2.5)

## 2017-02-08 LAB — VITAMIN D 25 HYDROXY (VIT D DEFICIENCY, FRACTURES): Vit D, 25-Hydroxy: 84 ng/mL (ref 30–100)

## 2017-02-08 LAB — INSULIN, RANDOM: Insulin: 15.3 u[IU]/mL (ref 2.0–19.6)

## 2017-03-07 ENCOUNTER — Other Ambulatory Visit: Payer: Self-pay | Admitting: Internal Medicine

## 2017-03-13 ENCOUNTER — Other Ambulatory Visit: Payer: Self-pay | Admitting: Internal Medicine

## 2017-03-16 DIAGNOSIS — Z0289 Encounter for other administrative examinations: Secondary | ICD-10-CM | POA: Diagnosis not present

## 2017-03-16 DIAGNOSIS — F331 Major depressive disorder, recurrent, moderate: Secondary | ICD-10-CM | POA: Diagnosis not present

## 2017-03-16 DIAGNOSIS — F410 Panic disorder [episodic paroxysmal anxiety] without agoraphobia: Secondary | ICD-10-CM | POA: Diagnosis not present

## 2017-03-16 DIAGNOSIS — Z79899 Other long term (current) drug therapy: Secondary | ICD-10-CM | POA: Diagnosis not present

## 2017-03-22 DIAGNOSIS — M25561 Pain in right knee: Secondary | ICD-10-CM | POA: Diagnosis not present

## 2017-03-22 DIAGNOSIS — M94261 Chondromalacia, right knee: Secondary | ICD-10-CM | POA: Diagnosis not present

## 2017-03-24 ENCOUNTER — Other Ambulatory Visit: Payer: Self-pay | Admitting: *Deleted

## 2017-03-24 MED ORDER — PANTOPRAZOLE SODIUM 40 MG PO TBEC: 40.0000 mg | DELAYED_RELEASE_TABLET | Freq: Two times a day (BID) | ORAL | 1 refills | Status: DC

## 2017-03-30 DIAGNOSIS — F410 Panic disorder [episodic paroxysmal anxiety] without agoraphobia: Secondary | ICD-10-CM | POA: Diagnosis not present

## 2017-03-30 DIAGNOSIS — F331 Major depressive disorder, recurrent, moderate: Secondary | ICD-10-CM | POA: Diagnosis not present

## 2017-03-31 ENCOUNTER — Telehealth: Payer: Self-pay | Admitting: *Deleted

## 2017-03-31 NOTE — Telephone Encounter (Signed)
Patient called and asked for the directions for mixing a vinegar solution to help avoid yeast in her mouth, due to inhaler use.  Per Dr Melford Aase, mix 1/2 tsp vinegar with 4 ounces of water, hold in the mouth for 1 minute 4 times daily.

## 2017-04-13 ENCOUNTER — Other Ambulatory Visit: Payer: Self-pay | Admitting: Physician Assistant

## 2017-04-14 ENCOUNTER — Other Ambulatory Visit: Payer: Self-pay

## 2017-04-15 MED ORDER — ATORVASTATIN CALCIUM 10 MG PO TABS: 10.0000 mg | ORAL_TABLET | Freq: Every day | ORAL | 1 refills | Status: DC

## 2017-04-15 MED ORDER — FLUTICASONE PROPIONATE 50 MCG/ACT NA SUSP: NASAL | 1 refills | Status: DC

## 2017-04-21 DIAGNOSIS — M25561 Pain in right knee: Secondary | ICD-10-CM | POA: Diagnosis not present

## 2017-04-21 DIAGNOSIS — M94261 Chondromalacia, right knee: Secondary | ICD-10-CM | POA: Diagnosis not present

## 2017-04-27 DIAGNOSIS — B9689 Other specified bacterial agents as the cause of diseases classified elsewhere: Secondary | ICD-10-CM | POA: Diagnosis not present

## 2017-04-27 DIAGNOSIS — L0202 Furuncle of face: Secondary | ICD-10-CM | POA: Diagnosis not present

## 2017-04-27 DIAGNOSIS — L728 Other follicular cysts of the skin and subcutaneous tissue: Secondary | ICD-10-CM | POA: Diagnosis not present

## 2017-04-27 DIAGNOSIS — D2262 Melanocytic nevi of left upper limb, including shoulder: Secondary | ICD-10-CM | POA: Diagnosis not present

## 2017-05-26 ENCOUNTER — Other Ambulatory Visit: Payer: Self-pay | Admitting: Physician Assistant

## 2017-05-27 ENCOUNTER — Other Ambulatory Visit: Payer: Self-pay | Admitting: *Deleted

## 2017-05-27 MED ORDER — MONTELUKAST SODIUM 10 MG PO TABS: ORAL_TABLET | ORAL | 3 refills | Status: DC

## 2017-06-05 ENCOUNTER — Encounter: Payer: Self-pay | Admitting: Internal Medicine

## 2017-06-05 NOTE — Progress Notes (Signed)
[facility] Unk Pinto, M.D.      Uvaldo Bristle. Silverio Lay, P.A.-C [facility]                [address] [place], [place] [address] Telephone [phone] Telefax [phone]  Annual Screening/Preventative Visit & Comprehensive Evaluation &  Examination     This very nice 61 y.o. MWF presents for a Screening/Preventative Visit & comprehensive evaluation and management of multiple medical co-morbidities.  Patient has been followed for HTN, Prediabetes, Hyperlipidemia and Vitamin D Deficiency. Patient has GERD controlled on current meds.      Patient also has Chronic Pain Syndrome related to Cx DDD and Cx HNP surg in 2011. In 2013, she underwent L5/S1 Surg by Dr Ellene Route and again in Nov 2016, she had a Lumbar fusion.      HTN predates since 2007. Patient's BP has been controlled at home and patient denies any cardiac symptoms as chest pain, palpitations, shortness of breath, dizziness or ankle swelling. Today's BP: 120/76      Patient's hyperlipidemia is controlled with diet and medications. Patient denies myalgias or other medication SE's. Last lipids were at goal: Lab Results  Component Value Date   CHOL 155 02/07/2017   HDL 39 (L) 02/07/2017   LDLCALC 92 02/07/2017   TRIG 120 02/07/2017   CHOLHDL 4.0 02/07/2017      Patient has prediabetes (A1c 5.9% in 2009 and 6.3% in 2014) and patient denies reactive hypoglycemic symptoms, visual blurring, diabetic polys, or paresthesias. Last A1c was almost back to goal: Lab Results  Component Value Date   HGBA1C 5.8 (H) 02/07/2017      Patient has been on Thyroid replacement since 2009 and currently is taking 1/2 tab of thyroxine 50 mcg = 25 mcg. Finally, patient has history of Vitamin D Deficiency ("9" in 2009) and last Vitamin D was at goal: Lab Results  Component Value Date   VD25OH 84 02/07/2017   Current Outpatient Prescriptions on File Prior to Visit   Medication Sig  . albuterol (PROVENTIL HFA;VENTOLIN HFA) 108 (90 Base) MCG/ACT inhaler Inhale 2 puffs into the lungs every 4 (four) hours as needed for wheezing or shortness of breath.  Marland Kitchen atorvastatin (LIPITOR) 10 MG tablet Take 1 tablet (10 mg total) by mouth daily.  . Cholecalciferol (VITAMIN D PO) Take 4,000 Units by mouth daily.   Marland Kitchen CINNAMON PO Take 1,000 Units by mouth 2 (two) times daily.   . clonazePAM (KLONOPIN) 0.5 MG tablet Take 1/2 to 1 tablet up to 2  X / day only if needed for anxiety attack or sleep  . Cyanocobalamin (VITAMIN B-12) 2500 MCG SUBL Place 1 tablet under the tongue daily.   . diclofenac sodium (VOLTAREN) 1 % GEL Apply 4 g topically 4 (four) times daily.  . DULoxetine (CYMBALTA) 30 MG capsule TAKE ONE CAPSULE BY MOUTH DAILY  . DULoxetine (CYMBALTA) 60 MG capsule Take 1 capsule (60 mg total) by mouth 2 (two) times daily.  . enalapril (VASOTEC) 20 MG tablet TAKE 1 TABLET BY MOUTH DAILY  . fexofenadine-pseudoephedrine (ALLEGRA-D 12 HOUR) 60-120 MG per tablet Take 1 tablet by mouth daily.  . fluconazole (DIFLUCAN) 150 MG tablet TAKE 1 TABLET BY MOUTH 2 TIMES A WEEK  . fluocinonide cream (LIDEX) 0.05 % Apply to rash 2 x / day as needed  . fluticasone (FLONASE) 50 MCG/ACT nasal spray SHAKE  WELL AND USE 2 SPRAYS IN EACH NOSTRIL EVERY EVENING  . gabapentin (NEURONTIN) 300 MG capsule Take 300 mg by mouth 3 (three) times daily.   Marland Kitchen levothyroxine (SYNTHROID, LEVOTHROID) 50 MCG tablet TAKE 1 TABLET(50 MCG) BY MOUTH DAILY  . Magnesium 250 MG TABS Take 2 tablets by mouth daily.   . meloxicam (MOBIC) 15 MG tablet TAKE 1 TABLET(15 MG) BY MOUTH DAILY  . montelukast (SINGULAIR) 10 MG tablet TAKE 1 TABLET(10 MG) BY MOUTH AT BEDTIME  . pantoprazole (PROTONIX) 40 MG tablet Take 1 tablet (40 mg total) by mouth 2 (two) times daily.  . promethazine (PHENERGAN) 25 MG tablet Take 1 tablet (25 mg total) by mouth every 4 (four) hours as needed for nausea or vomiting.  . rizatriptan (MAXALT-MLT)  10 MG disintegrating tablet Take 1 tablet (10 mg total) by mouth as needed for migraine. May repeat in 2 hours if needed  . traMADol (ULTRAM) 50 MG tablet 1 pill twice daily as needed for pain  . fluticasone-salmeterol (ADVAIR HFA) 115-21 MCG/ACT inhaler 2 inhalations 10-15 minutes apart every 12 hours   No current facility-administered medications on file prior to visit.    Allergies  Allergen Reactions  . Demerol [Meperidine] Other (See Comments)    REACTION: unknown--itching  . Epinephrine Other (See Comments)    "Sensitivity."  . Niacin And Related Rash   Past Medical History:  Diagnosis Date  . Asthma    Dulera daily  . Chronic back pain    HNP  . Complication of anesthesia    slow to wake up  . Depression    takes Clonazepam nightly  . GERD (gastroesophageal reflux disease)    takes Protonix daily   . Heart murmur   . History of migraine    takes Relpax daily as needed  . HSV (herpes simplex virus) infection   . Hyperlipidemia    takes Atorvastatin daily  . Hypertension    takes Benicar daily  . Hypothyroidism    takes Synthroid daily  . MVP (mitral valve prolapse)   . Pneumonia    hx of--been many yrs ago  . Seasonal allergies    takes Allegra daily  . Shortness of breath    with exertion   Health Maintenance  Topic Date Due  . COLONOSCOPY  04/04/2007  . PAP SMEAR  01/12/2014  . MAMMOGRAM  10/04/2016  . INFLUENZA VACCINE  06/29/2017  . TETANUS/TDAP  05/05/2026  . Hepatitis C Screening  Completed  . HIV Screening  Completed   Immunization History  Administered Date(s) Administered  . Influenza,inj,quad, With Preservative 11/09/2016  . Influenza-Unspecified 09/18/2013  . PPD Test 04/17/2014  . Pneumococcal-Unspecified 11/30/1995  . Td 08/27/2005, 05/05/2016   Past Surgical History:  Procedure Laterality Date  . ABLATION  2011  . buttocks surgery     at age 29  . CERVICAL FUSION  03/2010  . CESAREAN SECTION  1992  . CHOLECYSTECTOMY  2008  .  COLONOSCOPY    . DILATION AND CURETTAGE OF UTERUS  2011  . ESOPHAGOGASTRODUODENOSCOPY    . LUMBAR LAMINECTOMY/DECOMPRESSION MICRODISCECTOMY  10/24/2012   Procedure: LUMBAR LAMINECTOMY/DECOMPRESSION MICRODISCECTOMY 1 LEVEL;  Surgeon: Kristeen Miss, MD;  Location: [facility];  Service: Neurosurgery;  Laterality: Right;  Right Lumbar five-Sacral One Microdiskectomy  . right knee arthroscopy    . TONSILLECTOMY     at age 53   Family History  Problem Relation Age of Onset  . Arthritis Mother   . Heart attack Mother   .  Depression Mother   . Allergies Mother   . Hypertension Mother   . Heart attack Father    Social History  Substance Use Topics  . Smoking status: Former Research scientist (life sciences)  . Smokeless tobacco: Never Used     Comment: quit at age 61  . Alcohol use No     Comment: 2-3 times a yr    ROS Constitutional: Denies fever, chills, weight loss/gain, headaches, insomnia,  night sweats, and change in appetite. Does c/o fatigue. Eyes: Denies redness, blurred vision, diplopia, discharge, itchy, watery eyes.  ENT: Denies discharge, congestion, post nasal drip, epistaxis, sore throat, earache, hearing loss, dental pain, Tinnitus, Vertigo, Sinus pain, snoring.  Cardio: Denies chest pain, palpitations, irregular heartbeat, syncope, dyspnea, diaphoresis, orthopnea, PND, claudication, edema Respiratory: denies cough, dyspnea, DOE, pleurisy, hoarseness, laryngitis, wheezing.  Gastrointestinal: Denies dysphagia, heartburn, reflux, water brash, pain, cramps, nausea, vomiting, bloating, diarrhea, constipation, hematemesis, melena, hematochezia, jaundice, hemorrhoids Genitourinary: Denies dysuria, frequency, urgency, nocturia, hesitancy, discharge, hematuria, flank pain Breast: Breast lumps, nipple discharge, bleeding.  Musculoskeletal: Denies arthralgia, myalgia, stiffness, Jt. Swelling, pain, limp, and strain/sprain. Denies falls. Skin: Denies puritis, rash, hives, warts, acne, eczema, changing in skin  lesion Neuro: No weakness, tremor, incoordination, spasms, paresthesia, pain Psychiatric: Denies confusion, memory loss, sensory loss. Denies Depression. Endocrine: Denies change in weight, skin, hair change, nocturia, and paresthesia, diabetic polys, visual blurring, hyper / hypo glycemic episodes.  Heme/Lymph: No excessive bleeding, bruising, enlarged lymph nodes.  Physical Exam  BP 120/76   Pulse 80   Temp 97.8 F (36.6 C)   Resp 16   Ht 5\' 1"  (1.549 m)   Wt 116 lb 6.4 oz (52.8 kg)   BMI 21.99 kg/m   General Appearance: Well nourished, well groomed and in no apparent distress.  Eyes: PERRLA, EOMs, conjunctiva no swelling or erythema, normal fundi and vessels. Sinuses: No frontal/maxillary tenderness ENT/Mouth: EACs patent / TMs  nl. Nares clear without erythema, swelling, mucoid exudates. Oral hygiene is good. No erythema, swelling, or exudate. Tongue normal, non-obstructing. Tonsils not swollen or erythematous. Hearing normal.  Neck: Supple, thyroid normal. No bruits, nodes or JVD. Respiratory: Respiratory effort normal.  BS equal and clear bilateral without rales, rhonci, wheezing or stridor. Cardio: Heart sounds are normal with regular rate and rhythm and no murmurs, rubs or gallops. Peripheral pulses are normal and equal bilaterally without edema. No aortic or femoral bruits. Chest: symmetric with normal excursions and percussion. Breasts: Symmetric, without lumps, nipple discharge, retractions, or fibrocystic changes.  Abdomen: Flat, soft with bowel sounds active. Nontender, no guarding, rebound, hernias, masses, or organomegaly.  Lymphatics: Non tender without lymphadenopathy.  Genitourinary:  Musculoskeletal: Full ROM all peripheral extremities, joint stability, 5/5 strength, and normal gait. Skin: Warm and dry without rashes, lesions, cyanosis, clubbing or  ecchymosis.  Neuro: Cranial nerves intact, reflexes equal bilaterally. Normal muscle tone, no cerebellar symptoms.  Sensation intact.  Pysch: Alert and oriented X 3, normal affect, Insight and Judgment appropriate.   Assessment and Plan  1. Annual Preventative Screening Examination  2. Essential hypertension  - EKG 12-Lead - Urinalysis, Routine w reflex microscopic - Microalbumin / creatinine urine ratio - CBC with Differential/Platelet - BASIC METABOLIC PANEL WITH GFR - Magnesium - TSH  3. Hyperlipidemia, mixed  - EKG 12-Lead - Hepatic function panel - Lipid panel  4. Prediabetes  - EKG 12-Lead - Hemoglobin A1c - Insulin, random  5. Vitamin D deficiency  - VITAMIN D 25 Hydroxy   6. Hypothyroidism  - TSH  7.  Gastroesophageal reflux disease   8. DDD (degenerative disc disease), Cervical & Lumbar   9. Screening for rectal cancer  - POC Hemoccult Bld/Stl   10. Screening for ischemic heart disease  - EKG 12-Lead  11. Medication management  - Urinalysis, Routine w reflex microscopic - Microalbumin / creatinine urine ratio - BASIC METABOLIC PANEL WITH GFR - Hepatic function panel - Magnesium - Lipid panel - TSH - Hemoglobin A1c - Insulin, random - VITAMIN D 25 Hydroxy        Patient was counseled in prudent diet to achieve/maintain BMI less than 25 for weight control, BP monitoring, regular exercise and medications. Discussed med's effects and SE's. Screening labs and tests as requested with regular follow-up as recommended. Over 40 minutes of exam, counseling, chart review and high complex critical decision making was performed.

## 2017-06-05 NOTE — Patient Instructions (Signed)

## 2017-06-06 ENCOUNTER — Ambulatory Visit (INDEPENDENT_AMBULATORY_CARE_PROVIDER_SITE_OTHER): Payer: PPO | Admitting: Internal Medicine

## 2017-06-06 VITALS — BP 120/76 | HR 80 | Temp 97.8°F | Resp 16 | Ht 61.0 in | Wt 116.4 lb

## 2017-06-06 DIAGNOSIS — Z79899 Other long term (current) drug therapy: Secondary | ICD-10-CM

## 2017-06-06 DIAGNOSIS — E559 Vitamin D deficiency, unspecified: Secondary | ICD-10-CM

## 2017-06-06 DIAGNOSIS — Z0000 Encounter for general adult medical examination without abnormal findings: Secondary | ICD-10-CM | POA: Diagnosis not present

## 2017-06-06 DIAGNOSIS — E039 Hypothyroidism, unspecified: Secondary | ICD-10-CM

## 2017-06-06 DIAGNOSIS — I10 Essential (primary) hypertension: Secondary | ICD-10-CM | POA: Diagnosis not present

## 2017-06-06 DIAGNOSIS — R7303 Prediabetes: Secondary | ICD-10-CM

## 2017-06-06 DIAGNOSIS — Z136 Encounter for screening for cardiovascular disorders: Secondary | ICD-10-CM

## 2017-06-06 DIAGNOSIS — K219 Gastro-esophageal reflux disease without esophagitis: Secondary | ICD-10-CM

## 2017-06-06 DIAGNOSIS — M5030 Other cervical disc degeneration, unspecified cervical region: Secondary | ICD-10-CM

## 2017-06-06 DIAGNOSIS — E782 Mixed hyperlipidemia: Secondary | ICD-10-CM

## 2017-06-06 DIAGNOSIS — Z0001 Encounter for general adult medical examination with abnormal findings: Secondary | ICD-10-CM

## 2017-06-06 DIAGNOSIS — Z1212 Encounter for screening for malignant neoplasm of rectum: Secondary | ICD-10-CM

## 2017-06-06 LAB — HEPATIC FUNCTION PANEL
ALT: 15 U/L (ref 6–29)
AST: 18 U/L (ref 10–35)
Albumin: 4.4 g/dL (ref 3.6–5.1)
Alkaline Phosphatase: 70 U/L (ref 33–130)
Bilirubin, Direct: 0.1 mg/dL (ref <=0.2)
Indirect Bilirubin: 0.3 mg/dL (ref 0.2–1.2)
Total Bilirubin: 0.4 mg/dL (ref 0.2–1.2)
Total Protein: 6.7 g/dL (ref 6.1–8.1)

## 2017-06-06 LAB — BASIC METABOLIC PANEL WITH GFR
BUN: 23 mg/dL (ref 7–25)
CO2: 24 mmol/L (ref 20–31)
Calcium: 9.2 mg/dL (ref 8.6–10.4)
Chloride: 105 mmol/L (ref 98–110)
Creat: 0.89 mg/dL (ref 0.50–0.99)
GFR, Est African American: 81 mL/min (ref >=60)
GFR, Est Non African American: 71 mL/min (ref >=60)
Glucose, Bld: 127 mg/dL — ABNORMAL HIGH (ref 65–99)
Potassium: 5.1 mmol/L (ref 3.5–5.3)
Sodium: 141 mmol/L (ref 135–146)

## 2017-06-06 LAB — CBC WITH DIFFERENTIAL/PLATELET
Basophils Absolute: 0 cells/uL (ref 0–200)
Basophils Relative: 0 %
Eosinophils Absolute: 84 cells/uL (ref 15–500)
Eosinophils Relative: 1 %
HCT: 37 % (ref 35.0–45.0)
Hemoglobin: 12 g/dL (ref 11.7–15.5)
Lymphocytes Relative: 11 %
Lymphs Abs: 924 cells/uL (ref 850–3900)
MCH: 28.6 pg (ref 27.0–33.0)
MCHC: 32.4 g/dL (ref 32.0–36.0)
MCV: 88.1 fL (ref 80.0–100.0)
MPV: 9.5 fL (ref 7.5–12.5)
Monocytes Absolute: 336 cells/uL (ref 200–950)
Monocytes Relative: 4 %
Neutro Abs: 7056 cells/uL (ref 1500–7800)
Neutrophils Relative %: 84 %
Platelets: 318 10*3/uL (ref 140–400)
RBC: 4.2 MIL/uL (ref 3.80–5.10)
RDW: 14.1 % (ref 11.0–15.0)
WBC: 8.4 10*3/uL (ref 3.8–10.8)

## 2017-06-06 LAB — LIPID PANEL
Cholesterol: 194 mg/dL (ref <200)
HDL: 46 mg/dL — ABNORMAL LOW (ref >50)
LDL Cholesterol: 117 mg/dL — ABNORMAL HIGH (ref <100)
Total CHOL/HDL Ratio: 4.2 Ratio (ref <5.0)
Triglycerides: 153 mg/dL — ABNORMAL HIGH (ref <150)
VLDL: 31 mg/dL — ABNORMAL HIGH (ref <30)

## 2017-06-06 LAB — TSH: TSH: 0.4 mIU/L

## 2017-06-07 LAB — HEMOGLOBIN A1C
Hgb A1c MFr Bld: 5.9 % — ABNORMAL HIGH (ref <5.7)
Mean Plasma Glucose: 123 mg/dL

## 2017-06-07 LAB — URINALYSIS, ROUTINE W REFLEX MICROSCOPIC
Bilirubin Urine: NEGATIVE
Glucose, UA: NEGATIVE
Hgb urine dipstick: NEGATIVE
Ketones, ur: NEGATIVE
Leukocytes, UA: NEGATIVE
Nitrite: NEGATIVE
Protein, ur: NEGATIVE
Specific Gravity, Urine: 1.026 (ref 1.001–1.035)
pH: 7 (ref 5.0–8.0)

## 2017-06-07 LAB — INSULIN, RANDOM: Insulin: 28 u[IU]/mL — ABNORMAL HIGH (ref 2.0–19.6)

## 2017-06-07 LAB — VITAMIN D 25 HYDROXY (VIT D DEFICIENCY, FRACTURES): Vit D, 25-Hydroxy: 73 ng/mL (ref 30–100)

## 2017-06-07 LAB — MAGNESIUM: Magnesium: 2.2 mg/dL (ref 1.5–2.5)

## 2017-06-07 LAB — MICROALBUMIN / CREATININE URINE RATIO
Creatinine, Urine: 165 mg/dL (ref 20–320)
Microalb Creat Ratio: 4 mcg/mg creat (ref <30)
Microalb, Ur: 0.7 mg/dL

## 2017-06-21 ENCOUNTER — Other Ambulatory Visit: Payer: Self-pay | Admitting: Physician Assistant

## 2017-07-07 ENCOUNTER — Ambulatory Visit (INDEPENDENT_AMBULATORY_CARE_PROVIDER_SITE_OTHER): Payer: PPO | Admitting: Physician Assistant

## 2017-07-07 ENCOUNTER — Encounter: Payer: Self-pay | Admitting: Physician Assistant

## 2017-07-07 VITALS — BP 118/80 | HR 71 | Temp 97.9°F | Wt 116.2 lb

## 2017-07-07 DIAGNOSIS — B3789 Other sites of candidiasis: Secondary | ICD-10-CM

## 2017-07-07 MED ORDER — UMECLIDINIUM-VILANTEROL 62.5-25 MCG/INH IN AEPB: 1.0000 | INHALATION_SPRAY | Freq: Every day | RESPIRATORY_TRACT | 3 refills | Status: DC

## 2017-07-07 MED ORDER — FLUCONAZOLE 150 MG PO TABS: ORAL_TABLET | ORAL | 0 refills | Status: DC

## 2017-07-07 MED ORDER — NYSTATIN 100000 UNIT/ML MT SUSP: 5.0000 mL | Freq: Four times a day (QID) | OROMUCOSAL | 0 refills | Status: DC

## 2017-07-07 NOTE — Patient Instructions (Signed)
HOW TO TREAT VIRAL COUGH AND COLD SYMPTOMS:  -Symptoms usually last at least 1 week with the worst symptoms being around day 4.  - colds usually start with a sore throat and end with a cough, and the cough can take 2 weeks to get better.  -No antibiotics are needed for colds, flu, sore throats, cough, bronchitis UNLESS symptoms are longer than 7 days OR if you are getting better then get drastically worse.  -There are a lot of combination medications (Dayquil, Nyquil, Vicks 44, tyelnol cold and sinus, ETC). Please look at the ingredients on the back so that you are treating the correct symptoms and not doubling up on medications/ingredients.    Medicines you can use  Nasal congestion  - pseudoephedrine (Sudafed)- behind the counter, do not use if you have high blood pressure, medicine that have -D in them.  - phenylephrine (Sudafed PE) -Dextormethorphan + chlorpheniramine (Coridcidin HBP)- okay if you have high blood pressure -Oxymetazoline (Afrin) nasal spray- LIMIT to 3 days -Saline nasal spray -Neti pot (used distilled or bottled water)  Ear pain/congestion  -pseudoephedrine (sudafed) - Nasonex/flonase nasal spray  Fever  -Acetaminophen (Tyelnol) -Ibuprofen (Advil, motrin, aleve)  Sore Throat  -Acetaminophen (Tyelnol) -Ibuprofen (Advil, motrin, aleve) -Drink a lot of water -Gargle with salt water - Rest your voice (don't talk) -Throat sprays -Cough drops  Body Aches  -Acetaminophen (Tyelnol) -Ibuprofen (Advil, motrin, aleve)  Headache  -Acetaminophen (Tyelnol) -Ibuprofen (Advil, motrin, aleve) - Exedrin, Exedrin Migraine  Allergy symptoms (cough, sneeze, runny nose, itchy eyes) -Claritin or loratadine cheapest but likely the weakest  -Zyrtec or certizine at night because it can make you sleepy -The strongest is allegra or fexafinadine  Cheapest at [employer], [employer], [employer]  Cough  -Dextromethorphan (Delsym)- medicine that has DM in it -Guafenesin  (Mucinex/Robitussin) - cough drops - drink lots of water  Chest Congestion  -Guafenesin (Mucinex/Robitussin)  Red Itchy Eyes  - Naphcon-A  Upset Stomach  - Bland diet (nothing spicy, greasy, fried, and high acid foods like tomatoes, oranges, berries) -OKAY- cereal, bread, soup, crackers, rice -Eat smaller more frequent meals -reduce caffeine, no alcohol -Loperamide (Imodium-AD) if diarrhea -Prevacid for heart burn  General health when sick  -Hydration -wash your hands frequently -keep surfaces clean -change pillow cases and sheets often -Get fresh air but do not exercise strenuously -Vitamin D, double up on it - Vitamin C -Zinc     Oral Thrush, Adult Oral thrush, also called oral candidiasis, is a fungal infection that develops in the mouth and throat and on the tongue. It causes white patches to form on the mouth and tongue. Ritta Slot is most common in older adults, but it can occur at any age. Many cases of thrush are mild, but this infection can also be serious. Ritta Slot can be a repeated (recurrent) problem for certain people who have a weak body defense system (immune system). The weakness can be caused by chronic illnesses, or by taking medicines that limit the body's ability to fight infection. If a person has difficulty fighting infection, the fungus that causes thrush can spread through the body. This can cause life-threatening blood or organ infections. What are the causes? This condition is caused by a fungus (yeast) called Candida albicans.  This fungus is normally present in small amounts in the mouth and on other mucous membranes. It usually causes no harm.  If conditions are present that allow the fungus to grow without control, it invades surrounding tissues and becomes an infection.  Other  Candida species can also lead to thrush (rare).  What increases the risk? This condition is more likely to develop in:  People with a weakened immune system.  Older  adults.  People with HIV (human immunodeficiency virus).  People with diabetes.  People with dry mouth (xerostomia).  Pregnant women.  People with poor dental care, especially people who have false teeth.  People who use antibiotic medicines.  What are the signs or symptoms? Symptoms of this condition can vary from mild and moderate to severe and persistent. Symptoms may include:  A burning feeling in the mouth and throat. This can occur at the start of a thrush infection.  White patches that stick to the mouth and tongue. The tissue around the patches may be red, raw, and painful. If rubbed (during tooth brushing, for example), the patches and the tissue of the mouth may bleed easily.  A bad taste in the mouth or difficulty tasting foods.  A cottony feeling in the mouth.  Pain during eating and swallowing.  Poor appetite.  Cracking at the corners of the mouth.  How is this diagnosed? This condition is diagnosed based on:  Physical exam. Your health care provider will look in your mouth.  Health history. Your health care provider will ask you questions about your health.  How is this treated? This condition is treated with medicines called antifungals, which prevent the growth of fungi. These medicines are either applied directly to the affected area (topical) or swallowed (oral). The treatment will depend on the severity of the condition. Mild thrush Mild cases of thrush may clear up with the use of an antifungal mouth rinse or lozenges. Treatment usually lasts about 14 days. Moderate to severe thrush  More severe thrush infections that have spread to the esophagus are treated with an oral antifungal medicine. A topical antifungal medicine may also be used.  For some severe infections, treatment may need to continue for more than 14 days.  Oral antifungal medicines are rarely used during pregnancy because they may be harmful to the unborn child. If you are pregnant,  talk with your health care provider about options for treatment. Persistent or recurrent thrush For cases of thrush that do not go away or keep coming back:  Treatment may be needed twice as long as the symptoms last.  Treatment will include both oral and topical antifungal medicines.  People with a weakened immune system can take an antifungal medicine on a continuous basis to prevent thrush infections.  It is important to treat conditions that make a person more likely to get thrush, such as diabetes or HIV. Follow these instructions at home: Medicines  Take over-the-counter and prescription medicines only as told by your health care provider.  Talk with your health care provider about an over-the-counter medicine called gentian violet, which kills bacteria and fungi. Relieving soreness and discomfort To help reduce the discomfort of thrush:  Drink cold liquids such as water or iced tea.  Try flavored ice treats or frozen juices.  Eat foods that are easy to swallow, such as gelatin, ice cream, or custard.  Try drinking from a straw if the patches in your mouth are painful.  General instructions  Eat plain, unflavored yogurt as directed by your health care provider. Check the label to make sure the yogurt contains live cultures. This yogurt can help healthy bacteria to grow in the mouth and can stop the growth of the fungus that causes thrush.  If you wear dentures,  remove the dentures before going to bed, brush them vigorously, and soak them in a cleaning solution as directed by your health care provider.  Rinse your mouth with a warm salt-water mixture several times a day. To make a salt-water mixture, completely dissolve 1/2-1 tsp of salt in 1 cup of warm water. Contact a health care provider if:  Your symptoms are getting worse or are not improving within 7 days of starting treatment.  You have symptoms of a spreading infection, such as white patches on the skin outside of  the mouth. This information is not intended to replace advice given to you by your health care provider. Make sure you discuss any questions you have with your health care provider. Document Released: 08/10/2004 Document Revised: 08/09/2016 Document Reviewed: 08/09/2016 Elsevier Interactive Patient Education  2017 [employer].

## 2017-07-07 NOTE — Progress Notes (Signed)
Subjective:    Patient ID: Sarah Rodriguez, female    DOB: Oct 31, 1956, 61 y.o.   MRN: [id number]  HPI 61 y.o. WF presents with sinus pain x 1 days.  She started old prednisone, having right ear pain, sinus congestion, sore throat, right sided facial pain, teeth pain,   Blood pressure 118/80, pulse 71, temperature 97.9 F (36.6 C), weight 116 lb 3.2 oz (52.7 kg), SpO2 98 %.  Medications Current Outpatient Prescriptions on File Prior to Visit  Medication Sig  . albuterol (PROVENTIL HFA;VENTOLIN HFA) 108 (90 Base) MCG/ACT inhaler Inhale 2 puffs into the lungs every 4 (four) hours as needed for wheezing or shortness of breath.  Marland Kitchen atorvastatin (LIPITOR) 10 MG tablet Take 1 tablet (10 mg total) by mouth daily.  . Cholecalciferol (VITAMIN D PO) Take 4,000 Units by mouth daily.   Marland Kitchen CINNAMON PO Take 1,000 Units by mouth 2 (two) times daily.   . clonazePAM (KLONOPIN) 0.5 MG tablet Take 1/2 to 1 tablet up to 2  X / day only if needed for anxiety attack or sleep  . Cyanocobalamin (VITAMIN B-12) 2500 MCG SUBL Place 1 tablet under the tongue daily.   . diclofenac sodium (VOLTAREN) 1 % GEL Apply 4 g topically 4 (four) times daily.  . DULoxetine (CYMBALTA) 30 MG capsule TAKE 1 CAPSULE BY MOUTH DAILY  . enalapril (VASOTEC) 20 MG tablet TAKE 1 TABLET BY MOUTH DAILY  . fexofenadine-pseudoephedrine (ALLEGRA-D 12 HOUR) 60-120 MG per tablet Take 1 tablet by mouth daily.  . fluconazole (DIFLUCAN) 150 MG tablet TAKE 1 TABLET BY MOUTH 2 TIMES A WEEK  . fluocinonide cream (LIDEX) 0.05 % Apply to rash 2 x / day as needed  . fluticasone (FLONASE) 50 MCG/ACT nasal spray SHAKE WELL AND USE 2 SPRAYS IN EACH NOSTRIL EVERY EVENING  . gabapentin (NEURONTIN) 300 MG capsule Take 300 mg by mouth 3 (three) times daily.   Marland Kitchen levothyroxine (SYNTHROID, LEVOTHROID) 50 MCG tablet TAKE 1 TABLET(50 MCG) BY MOUTH DAILY  . Magnesium 250 MG TABS Take 2 tablets by mouth daily.   . meloxicam (MOBIC) 15 MG tablet TAKE 1 TABLET(15 MG) BY  MOUTH DAILY  . montelukast (SINGULAIR) 10 MG tablet TAKE 1 TABLET(10 MG) BY MOUTH AT BEDTIME  . pantoprazole (PROTONIX) 40 MG tablet Take 1 tablet (40 mg total) by mouth 2 (two) times daily.  . promethazine (PHENERGAN) 25 MG tablet Take 1 tablet (25 mg total) by mouth every 4 (four) hours as needed for nausea or vomiting.  . rizatriptan (MAXALT-MLT) 10 MG disintegrating tablet Take 1 tablet (10 mg total) by mouth as needed for migraine. May repeat in 2 hours if needed  . traMADol (ULTRAM) 50 MG tablet 1 pill twice daily as needed for pain  . fluticasone-salmeterol (ADVAIR HFA) 115-21 MCG/ACT inhaler 2 inhalations 10-15 minutes apart every 12 hours   No current facility-administered medications on file prior to visit.     Problem list She has Hyperlipidemia; Hypertension; Asthma; GERD (gastroesophageal reflux disease); Hypothyroidism; Vitamin D deficiency; Medication management; Prediabetes; Spondylolisthesis at L4-L5 level; Encounter for Medicare annual wellness exam; Migraine; Major depression; and Degenerative joint disease on her problem list.    Review of Systems  Constitutional: Negative for chills and diaphoresis.  HENT: Positive for congestion, ear pain, sinus pressure and sore throat. Negative for sneezing.   Respiratory: Negative.  Negative for cough and shortness of breath.   Cardiovascular: Negative.   Musculoskeletal: Positive for neck pain.  Neurological: Negative.  Negative for  headaches.       Objective:   Physical Exam  Constitutional: She appears well-developed and well-nourished.  HENT:  Head: Normocephalic and atraumatic.  Right Ear: External ear normal.  Left Ear: External ear normal.  Mouth/Throat: Uvula is midline and mucous membranes are normal. Posterior oropharyngeal edema and posterior oropharyngeal erythema present.  Eyes: Pupils are equal, round, and reactive to light. Conjunctivae and EOM are normal.  Neck: Normal range of motion. Neck supple.  Post  pharynx with erythema/scrapable white plaque   Cardiovascular: Normal rate, regular rhythm and normal heart sounds.   Pulmonary/Chest: Effort normal and breath sounds normal.  Abdominal: Soft. Bowel sounds are normal.  Lymphadenopathy:    She has no cervical adenopathy.  Skin: Skin is warm and dry.       Assessment & Plan:    Yeast pharyngitis -     fluconazole (DIFLUCAN) 150 MG tablet; TAKE 1 TABLET BY MOUTH 2 TIMES A WEEK -     nystatin (MYCOSTATIN) 100000 UNIT/ML suspension; Take 5 mLs (500,000 Units total) by mouth 4 (four) times daily. -     umeclidinium-vilanterol (ANORO ELLIPTA) 62.5-25 MCG/INH AEPB; Inhale 1 puff into the lungs daily. Make sure you rinse your mouth after each use.

## 2017-07-12 ENCOUNTER — Encounter (HOSPITAL_COMMUNITY): Payer: Self-pay | Admitting: Psychiatry

## 2017-07-12 ENCOUNTER — Ambulatory Visit (INDEPENDENT_AMBULATORY_CARE_PROVIDER_SITE_OTHER): Payer: PPO | Admitting: Psychiatry

## 2017-07-12 VITALS — BP 122/68 | HR 83 | Ht 61.0 in | Wt 114.8 lb

## 2017-07-12 DIAGNOSIS — Z87891 Personal history of nicotine dependence: Secondary | ICD-10-CM

## 2017-07-12 DIAGNOSIS — G4700 Insomnia, unspecified: Secondary | ICD-10-CM

## 2017-07-12 DIAGNOSIS — M549 Dorsalgia, unspecified: Secondary | ICD-10-CM | POA: Diagnosis not present

## 2017-07-12 DIAGNOSIS — Z818 Family history of other mental and behavioral disorders: Secondary | ICD-10-CM | POA: Diagnosis not present

## 2017-07-12 DIAGNOSIS — F410 Panic disorder [episodic paroxysmal anxiety] without agoraphobia: Secondary | ICD-10-CM

## 2017-07-12 DIAGNOSIS — G8929 Other chronic pain: Secondary | ICD-10-CM

## 2017-07-12 DIAGNOSIS — Z560 Unemployment, unspecified: Secondary | ICD-10-CM

## 2017-07-12 DIAGNOSIS — M79669 Pain in unspecified lower leg: Secondary | ICD-10-CM | POA: Diagnosis not present

## 2017-07-12 DIAGNOSIS — F331 Major depressive disorder, recurrent, moderate: Secondary | ICD-10-CM

## 2017-07-12 MED ORDER — AMITRIPTYLINE HCL 25 MG PO TABS: 25.0000 mg | ORAL_TABLET | Freq: Every day | ORAL | 0 refills | Status: DC
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
## 2017-07-13 ENCOUNTER — Encounter (HOSPITAL_COMMUNITY): Payer: Self-pay | Admitting: Licensed Clinical Social Worker

## 2017-07-13 ENCOUNTER — Ambulatory Visit (INDEPENDENT_AMBULATORY_CARE_PROVIDER_SITE_OTHER): Payer: PPO | Admitting: Licensed Clinical Social Worker

## 2017-07-13 DIAGNOSIS — F331 Major depressive disorder, recurrent, moderate: Secondary | ICD-10-CM

## 2017-07-13 NOTE — Progress Notes (Signed)
Comprehensive Clinical Assessment (CCA) Note  07/13/2017 Dimitri Ped [id number]  Visit Diagnosis:      ICD-10-CM   1. MDD (major depressive disorder), recurrent episode, moderate ([place]) F33.1       CCA Part One  Part One has been completed on paper by the patient.  (See scanned document in Chart Review)  CCA Part Two A  Intake/Chief Complaint:  CCA Intake With Chief Complaint CCA Part Two Date: 07/13/17 CCA Part Two Time: 03/07/10 Chief Complaint/Presenting Problem: Pt is being referred to therapy by Dr Adele Schilder for depression.She was seeing Dr. Letta Moynahan until last year her insurance changed.  Her biggest stressor is her chronic health and chronic pain.  Patient has neck and back surgery and she still have a lot of pain in her legs. She is sad because her mother died in Mar 08, 2015 and since then she lost touch with her brother.  She has a father who lives 2 hours away and she tried to see him whenever she had a time.  Her husband is very supportive usually transport her.  Patient has a daughter who lives in [place] but since she moved in Mar 07, 2009 she did not return.  Patient reported that sometimes she feels hopeless helpless and having crying spells.  She lost church and friendship.  She admitted poor sleep, racing thoughts, anxiety and worries about her future.  She denies any hallucination, paranoia, suicidal thoughts or any self abusive behavior.  She also mentioned very sensitive his psychiatric medication.  She was prescribed gabapentin for chronic pain but she could not take more than 300 day.  She reported after taking the medication she has to lie down because she becomes very loopy.  Even though she prescribed Cymbalta 90 mg she does not take every day because it makes her numb and constipation.  She endorse her energy level is gradually going down because of the pain.  She used to enjoy life when she was working but things are changed since she is on disability and lost her  mother in 03/08/15.  Patient denies drinking alcohol or using any illegal substances.  She lives with her husband who is very supportive.   Patients Currently Reported Symptoms/Problems: Tearful, helpless, hopeless, poor sleep, racing thoughts, panic attacks Collateral Involvement: Dr. 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 Type of Services Patient Feels Are Needed: outpatient therapy  Mental Health Symptoms Depression:  Depression: Change in energy/activity, Fatigue, Hopelessness, Irritability, Tearfulness, Worthlessness  Mania:     Anxiety:   Anxiety: Fatigue, Irritability, Restlessness, Tension, Worrying  Psychosis:     Trauma:  Trauma: Avoids reminders of event (death of  mother)  Obsessions:     Compulsions:     Inattention:     Hyperactivity/Impulsivity:     Oppositional/Defiant Behaviors:     Borderline Personality:  Emotional Irregularity: Chronic feelings of emptiness  Other Mood/Personality Symptoms:      Mental Status Exam Appearance and self-care  Stature:  Stature: Small  Weight:  Weight: Thin  Clothing:  Clothing: Casual  Grooming:  Grooming: Normal  Cosmetic use:  Cosmetic Use: None  Posture/gait:  Posture/Gait: Normal  Motor activity:  Motor Activity: Not Remarkable  Sensorium  Attention:  Attention: Normal  Concentration:  Concentration: Scattered  Orientation:  Orientation: X5  Recall/memory:  Recall/Memory: Normal  Affect and Mood  Affect:  Affect: Tearful, Depressed  Mood:  Mood: Depressed  Relating  Eye contact:  Eye Contact: Normal  Facial expression:  Facial Expression: Depressed  Attitude toward examiner:  Attitude Toward Examiner: Cooperative  Thought and Language  Speech flow: Speech Flow: Normal  Thought content:  Thought Content: Appropriate to mood and circumstances  Preoccupation:   Preoccupations: Ruminations  Hallucinations:     Organization:     Transport planner of Knowledge:  Fund of Knowledge: Impoverished by:  (Comment)  Intelligence:  Intelligence: Average  Abstraction:  Abstraction: Normal  Judgement:  Judgement: Fair  Art therapist:  Reality Testing: Adequate  Insight:  Insight: Fair  Decision Making:  Decision Making: Vacilates  Social Functioning  Social Maturity:  Social Maturity: Responsible  Social Judgement:  Social Judgement: Normal  Stress  Stressors:  Stressors: Grief/losses, Illness, Family conflict  Coping Ability:  Coping Ability: Deficient supports, Theatre stage manager, English as a second language teacher Deficits:     Supports:      Family and Psychosocial History: Family history Marital status: Married Number of Years Married: 12 What types of issues is patient dealing with in the relationship?: i am depressed and he has to deal with my depressive symptoms and panic attacks Does patient have children?: Yes How many children?: 1 How is patient's relationship with their children?: Daughter lives in [place]  Childhood History:  Childhood History By whom was/is the patient raised?: Both parents Additional childhood history information: mom was sick a lot, bronchitis and surgeries, fibromyalgia Description of patient's relationship with caregiver when they were a child: good relationship Patient's description of current relationship with people who raised him/her: mother passed away in March 07, 2015, tries to see father as often as possible (dementia) How were you disciplined when you got in trouble as a child/adolescent?: I was a good kid Does patient have siblings?: Yes Number of Siblings: 1 Description of patient's current relationship with siblings: not close since my mother died, I don't know why Did patient suffer any verbal/emotional/physical/sexual abuse as a child?: No Did patient suffer from severe childhood neglect?: No Has patient ever been sexually  abused/assaulted/raped as an adolescent or adult?: No Was the patient ever a victim of a crime or a disaster?: No Witnessed domestic violence?: No Has patient been effected by domestic violence as an adult?: No  CCA Part Two B  Employment/Work Situation: Employment / Work Copywriter, advertising Employment situation: On disability Why is patient on disability: back surgery How long has patient been on disability: 4 years What is the longest time patient has a held a job?: 9 years Where was the patient employed at that time?: [employer] Has patient ever been in the [employer]?: No  Education: Education Did Teacher, adult education From [employer]?: Yes Did Physicist, medical?: Yes What Type of College Degree Do you Have?: BA [employer] Did You Attend Graduate School?: No What Was Your Major?: Nursing  Religion: Religion/Spirituality Are You A Religious Person?: Yes  Leisure/Recreation: Leisure / Recreation Leisure and Hobbies: walk my dog  Exercise/Diet: Exercise/Diet Do You Exercise?: No Have You Gained or Lost A Significant Amount of Weight in the Past Six Months?: No Do You Follow a Special Diet?: No Do You Have Any Trouble Sleeping?: Yes Explanation of Sleeping Difficulties: trouble going to sleep and staying asleep  CCA Part Two C  Alcohol/Drug Use: Alcohol / Drug Use History of alcohol / drug use?: No history of alcohol / drug abuse  CCA Part Three  ASAM's:  Six Dimensions of Multidimensional Assessment  Dimension 1:  Acute Intoxication and/or Withdrawal Potential:     Dimension 2:  Biomedical Conditions and Complications:     Dimension 3:  Emotional, Behavioral, or Cognitive Conditions and Complications:     Dimension 4:  Readiness to Change:     Dimension 5:  Relapse, Continued use, or Continued Problem Potential:     Dimension 6:  Recovery/Living Environment:      Substance use Disorder (SUD)    Social Function:  Social Functioning Social  Maturity: Responsible Social Judgement: Normal  Stress:  Stress Stressors: Grief/losses, Illness, Family conflict Coping Ability: Deficient supports, Theatre stage manager, Overwhelmed Patient Takes Medications The Way The Doctor Instructed?: Yes Priority Risk: Low Acuity  Risk Assessment- Self-Harm Potential: Risk Assessment For Self-Harm Potential Thoughts of Self-Harm: No current thoughts Method: No plan Availability of Means: No access/NA  Risk Assessment -Dangerous to Others Potential: Risk Assessment For Dangerous to Others Potential Method: No Plan Availability of Means: No access or NA Intent: Vague intent or NA  DSM5 Diagnoses: Patient Active Problem List   Diagnosis Date Noted  . Major depression 08/05/2016  . Degenerative joint disease 08/05/2016  . Migraine 03/03/2016  . Encounter for Medicare annual wellness exam 02/16/2016  . Spondylolisthesis at L4-L5 level 10/27/2015  . Prediabetes 12/12/2014  . Vitamin D deficiency 04/17/2014  . Medication management 04/17/2014  . Hyperlipidemia   . Hypertension   . Asthma   . GERD (gastroesophageal reflux disease)   . Hypothyroidism     Patient Centered Plan: Patient is on the following Treatment Plan(s): Depression and panic attacks  Recommendations for Services/Supports/Treatments: Recommendations for Services/Supports/Treatments Recommendations For Services/Supports/Treatments: IOP (Intensive Outpatient Program), Individual Therapy, Medication Management  Treatment Plan Summary: To be completed by individual theapist    Referrals to Alternative Service(s): Referred to Alternative Service(s):   Place:   Date:   Time:    Referred to Alternative Service(s):   Place:   Date:   Time:    Referred to Alternative Service(s):   Place:   Date:   Time:    Referred to Alternative Service(s):   Place:   Date:   Time:     Jenkins Rouge

## 2017-07-22 ENCOUNTER — Other Ambulatory Visit: Payer: Self-pay | Admitting: Internal Medicine

## 2017-07-26 ENCOUNTER — Other Ambulatory Visit: Payer: Self-pay | Admitting: Internal Medicine

## 2017-08-04 ENCOUNTER — Other Ambulatory Visit: Payer: Self-pay | Admitting: Internal Medicine

## 2017-08-04 NOTE — Telephone Encounter (Signed)
Please call Clonazepam

## 2017-08-05 ENCOUNTER — Other Ambulatory Visit: Payer: Self-pay | Admitting: Internal Medicine

## 2017-08-05 DIAGNOSIS — J4520 Mild intermittent asthma, uncomplicated: Secondary | ICD-10-CM

## 2017-08-10 ENCOUNTER — Ambulatory Visit (HOSPITAL_COMMUNITY): Payer: PPO | Admitting: Psychiatry

## 2017-08-15 ENCOUNTER — Other Ambulatory Visit: Payer: Self-pay

## 2017-08-15 MED ORDER — DULOXETINE HCL 60 MG PO CPEP: 60.0000 mg | ORAL_CAPSULE | Freq: Every day | ORAL | 0 refills | Status: DC

## 2017-08-16 ENCOUNTER — Other Ambulatory Visit (HOSPITAL_COMMUNITY): Payer: Self-pay

## 2017-08-16 DIAGNOSIS — F331 Major depressive disorder, recurrent, moderate: Secondary | ICD-10-CM

## 2017-08-16 MED ORDER — AMITRIPTYLINE HCL 25 MG PO TABS: 25.0000 mg | ORAL_TABLET | Freq: Every day | ORAL | 0 refills | Status: DC

## 2017-08-17 ENCOUNTER — Encounter (HOSPITAL_COMMUNITY): Payer: Self-pay | Admitting: Licensed Clinical Social Worker

## 2017-08-17 ENCOUNTER — Ambulatory Visit (INDEPENDENT_AMBULATORY_CARE_PROVIDER_SITE_OTHER): Payer: PPO | Admitting: Licensed Clinical Social Worker

## 2017-08-17 DIAGNOSIS — F331 Major depressive disorder, recurrent, moderate: Secondary | ICD-10-CM

## 2017-08-17 NOTE — Progress Notes (Signed)
   THERAPIST PROGRESS NOTE  Session Time: 3:10-4pm  Participation Level: Active  Behavioral Response: CasualAlertAnxious and Depressed  Type of Therapy: Individual Therapy  Treatment Goals addressed: Depressive symptoms  Interventions: CBT/Motivational Interviewing  Summary: Sarah Rodriguez is a 61 y.o. female who presents for her first individual counseling session. Spent a considerable amount of time building a trusting therapeutic relationship. Pt reports she has panic attacks and depression. She was a Marine scientist at [employer]. She hurt her back and had to have 2 back surgeries. She has a lot of shame due to not having a job and being on disability. She isolates and reports she has no friends. Her mother passed away in 08-Mar-2015 and her father still lives about 2 miles away. Pt reports she has lost a lot of family and friend relationships and doesn't know why, including members of her church. Pt feels worthless and was tearful. Discussed with pt the benefits of the IOP program. Pt reports she will speak to her husband about it and talk to her insurance co. SHe will call back with her decision.  Suicidal/Homicidal: Nowithout intent/plan  Therapist Response: Assessed pt'Rodriguez current functioning by self report. Assisted pt building a trusting therapeutic relationship. Discussed the suggestion of IOP program.  Plan: Return again in 2 weeks.. Call with decision of IOP program.  Diagnosis: Axis I: MDD, recurrent epsode moderate        Sarah Rodriguez,Sarah Rodriguez, LCAS 08/17/2017

## 2017-08-24 ENCOUNTER — Ambulatory Visit (INDEPENDENT_AMBULATORY_CARE_PROVIDER_SITE_OTHER): Payer: PPO | Admitting: Psychiatry

## 2017-08-24 ENCOUNTER — Encounter (HOSPITAL_COMMUNITY): Payer: Self-pay | Admitting: Psychiatry

## 2017-08-24 DIAGNOSIS — M2550 Pain in unspecified joint: Secondary | ICD-10-CM

## 2017-08-24 DIAGNOSIS — Z560 Unemployment, unspecified: Secondary | ICD-10-CM | POA: Diagnosis not present

## 2017-08-24 DIAGNOSIS — F410 Panic disorder [episodic paroxysmal anxiety] without agoraphobia: Secondary | ICD-10-CM | POA: Diagnosis not present

## 2017-08-24 DIAGNOSIS — F419 Anxiety disorder, unspecified: Secondary | ICD-10-CM

## 2017-08-24 DIAGNOSIS — F331 Major depressive disorder, recurrent, moderate: Secondary | ICD-10-CM | POA: Diagnosis not present

## 2017-08-24 DIAGNOSIS — Z87891 Personal history of nicotine dependence: Secondary | ICD-10-CM

## 2017-08-24 DIAGNOSIS — R450 Nervousness: Secondary | ICD-10-CM | POA: Diagnosis not present

## 2017-08-24 DIAGNOSIS — Z818 Family history of other mental and behavioral disorders: Secondary | ICD-10-CM | POA: Diagnosis not present

## 2017-08-24 DIAGNOSIS — M549 Dorsalgia, unspecified: Secondary | ICD-10-CM

## 2017-08-24 MED ORDER — LAMOTRIGINE 25 MG PO TABS: ORAL_TABLET | ORAL | 1 refills | Status: DC

## 2017-08-24 MED ORDER — AMITRIPTYLINE HCL 25 MG PO TABS: 25.0000 mg | ORAL_TABLET | Freq: Every day | ORAL | 1 refills | Status: DC

## 2017-08-24 NOTE — Progress Notes (Signed)
BH MD/PA/NP OP Progress Note  08/24/2017 2:32 PM MARIGNY BORRE  MRN:  [id number]  Chief Complaint:  I'm doing so-so.  I feel lonely.  HPI: Timisha is 61 year old Caucasian, married unemployed female who was seen first time 6 weeks ago.  She had a long history of depression and anxiety symptoms.  She was seeing Dr. Caprice Beaver until last year her insurance changed and she is no longer able to see her.  She is taking Cymbalta 60 mg alternative 90 mg some days.  She's also taking Klonopin 0.5 mg for panic attacks and recently recommended to try amitriptyline.  She is sleeping better with amitriptyline but she continues to feel lonely, depressed, isolated, withdrawn.  She endorsed her depression is situational because she has no friends and she is not involved in church.  She tried Cymbalta 90 mg every day but she developed GI side effects.  Patient told that she is sensitive with psychotropic medication.  Patient is very emotional, tearful, crying about feeling lonely less and lack of friends.  She is sensitive and worried about her father who is 30 year old and has been not doing good.  She wants to see him but he lives 2 hours away and she cannot drive due to chronic pain.  She is seeing [facility] for counseling.  Patient has chronic pain including tingling, joint pain, numbness.  She has neck and back surgery.  She missed her mother who died in 03-14-15.  Her daughter lives in [place].  She lives with her husband who is supportive but she wish that she is more able to do things on her own.  Sometimes she feels helpless and hopeless but denies any suicidal thoughts.  Patient denies any paranoia, hallucination or any self abusive behavior.  Her appetite is okay.  Her energy level is fair.  Her vital signs are stable.  Visit Diagnosis:    ICD-10-CM   1. MDD (major depressive disorder), recurrent episode, moderate (HCC) F33.1 lamoTRIgine (LAMICTAL) 25 MG tablet    amitriptyline (ELAVIL) 25 MG  tablet    Past Psychiatric History: Reviewed. Patient reported history of depression and has been taking antidepressants since 03/14/1995.  She had a history of panic attacks while driving.  She was seeing Dr. Letta Moynahan and tried Prozac, Paxil, Zoloft, Xanax, Cymbalta and Klonopin.  Patient denies any history of psychiatric inpatient treatment or any suicidal attempt.  Past Medical History:  Past Medical History:  Diagnosis Date  . Asthma    Dulera daily  . Chronic back pain    HNP  . Complication of anesthesia    slow to wake up  . Depression    takes Clonazepam nightly  . GERD (gastroesophageal reflux disease)    takes Protonix daily   . Heart murmur   . History of migraine    takes Relpax daily as needed  . HSV (herpes simplex virus) infection   . Hyperlipidemia    takes Atorvastatin daily  . Hypertension    takes Benicar daily  . Hypothyroidism    takes Synthroid daily  . MVP (mitral valve prolapse)   . Pneumonia    hx of--been many yrs ago  . Seasonal allergies    takes Allegra daily  . Shortness of breath    with exertion    Past Surgical History:  Procedure Laterality Date  . ABLATION  March 13, 2010  . buttocks surgery     at age 61  . CERVICAL FUSION  03/2010  . CESAREAN  SECTION  1992  . CHOLECYSTECTOMY  2008  . COLONOSCOPY    . DILATION AND CURETTAGE OF UTERUS  2011  . ESOPHAGOGASTRODUODENOSCOPY    . LUMBAR LAMINECTOMY/DECOMPRESSION MICRODISCECTOMY  10/24/2012   Procedure: LUMBAR LAMINECTOMY/DECOMPRESSION MICRODISCECTOMY 1 LEVEL;  Surgeon: Kristeen Miss, MD;  Location: [facility];  Service: Neurosurgery;  Laterality: Right;  Right Lumbar five-Sacral One Microdiskectomy  . right knee arthroscopy    . TONSILLECTOMY     at age 55    Family Psychiatric History: Reviewed.  Family History:  Family History  Problem Relation Age of Onset  . Arthritis Mother   . Heart attack Mother   . Depression Mother   . Allergies Mother   . Hypertension Mother   . Heart  attack Father     Social History:  Social History   Social History  . Marital status: Married    Spouse name: N/A  . Number of children: N/A  . Years of education: N/A   Social History Main Topics  . Smoking status: Former Research scientist (life sciences)  . Smokeless tobacco: Never Used     Comment: quit at age 41  . Alcohol use No     Comment: 2-3 times a yr  . Drug use: No  . Sexual activity: Yes    Birth control/ protection: Post-menopausal   Other Topics Concern  . Not on file   Social History Narrative  . No narrative on file    Allergies:  Allergies  Allergen Reactions  . Demerol [Meperidine] Other (See Comments)    REACTION: unknown--itching  . Epinephrine Other (See Comments)    "Sensitivity."  . Niacin And Related Rash    Metabolic Disorder Labs: Lab Results  Component Value Date   HGBA1C 5.9 (H) 06/06/2017   MPG 123 06/06/2017   MPG 120 02/07/2017   No results found for: PROLACTIN Lab Results  Component Value Date   CHOL 194 06/06/2017   TRIG 153 (H) 06/06/2017   HDL 46 (L) 06/06/2017   CHOLHDL 4.2 06/06/2017   VLDL 31 (H) 06/06/2017   LDLCALC 117 (H) 06/06/2017   LDLCALC 92 02/07/2017   Lab Results  Component Value Date   TSH 0.40 06/06/2017   TSH 1.12 02/07/2017    Therapeutic Level Labs: No results found for: LITHIUM No results found for: VALPROATE No components found for:  CBMZ  Current Medications: Current Outpatient Prescriptions  Medication Sig Dispense Refill  . ADVAIR HFA 115-21 MCG/ACT inhaler INHALE 2 PUFFS BY MOUTH EVERY 12 HOURS 10 TO 15 MINUTES APART 3 Inhaler 3  . amitriptyline (ELAVIL) 25 MG tablet Take 1 tablet (25 mg total) by mouth at bedtime. 30 tablet 1  . atorvastatin (LIPITOR) 10 MG tablet Take 1 tablet (10 mg total) by mouth daily. 90 tablet 1  . atorvastatin (LIPITOR) 10 MG tablet TAKE 1 TABLET BY MOUTH EVERY DAY 90 tablet 0  . Cholecalciferol (VITAMIN D PO) Take 4,000 Units by mouth daily.     Marland Kitchen CINNAMON PO Take 1,000 Units by  mouth 2 (two) times daily.     . clonazePAM (KLONOPIN) 0.5 MG tablet TAKE 1/2 (ONE-HALF) TO 1 TABLET BY MOUTH UP TO  TWICE DAILY ONLY IF NEEDED FOR AN ANXIETY ATTACK OR SLEEP 60 tablet 0  . Cyanocobalamin (VITAMIN B-12) 2500 MCG SUBL Place 1 tablet under the tongue daily.     . diclofenac sodium (VOLTAREN) 1 % GEL Apply 4 g topically 4 (four) times daily. 100 g 3  . DULoxetine (CYMBALTA) 60  MG capsule Take 1 capsule (60 mg total) by mouth daily. 180 capsule 0  . enalapril (VASOTEC) 20 MG tablet TAKE 1 TABLET BY MOUTH DAILY 90 tablet 1  . fexofenadine-pseudoephedrine (ALLEGRA-D 12 HOUR) 60-120 MG per tablet Take 1 tablet by mouth daily.    . fluconazole (DIFLUCAN) 150 MG tablet TAKE 1 TABLET BY MOUTH 2 TIMES A WEEK 4 tablet 0  . fluocinonide cream (LIDEX) 0.05 % Apply to rash 2 x / day as needed 60 g 3  . fluticasone (FLONASE) 50 MCG/ACT nasal spray SHAKE WELL AND USE 2 SPRAYS IN EACH NOSTRIL EVERY EVENING 48 g 1  . gabapentin (NEURONTIN) 100 MG capsule Take 100 mg by mouth 3 (three) times daily.    Marland Kitchen lamoTRIgine (LAMICTAL) 25 MG tablet Take 1 tab daily for 1 week and than 2 tab daily 60 tablet 1  . levothyroxine (SYNTHROID, LEVOTHROID) 50 MCG tablet TAKE 1 TABLET(50 MCG) BY MOUTH DAILY 90 tablet 0  . Magnesium 250 MG TABS Take 2 tablets by mouth daily.     . meloxicam (MOBIC) 15 MG tablet TAKE 1 TABLET(15 MG) BY MOUTH DAILY 90 tablet 3  . montelukast (SINGULAIR) 10 MG tablet TAKE 1 TABLET(10 MG) BY MOUTH AT BEDTIME 90 tablet 3  . nystatin (MYCOSTATIN) 100000 UNIT/ML suspension Take 5 mLs (500,000 Units total) by mouth 4 (four) times daily. 60 mL 0  . pantoprazole (PROTONIX) 40 MG tablet Take 1 tablet (40 mg total) by mouth 2 (two) times daily. 180 tablet 1  . promethazine (PHENERGAN) 25 MG tablet Take 1 tablet (25 mg total) by mouth every 4 (four) hours as needed for nausea or vomiting. 100 tablet 0  . umeclidinium-vilanterol (ANORO ELLIPTA) 62.5-25 MCG/INH AEPB Inhale 1 puff into the lungs daily.  Make sure you rinse your mouth after each use. 1 each 3   No current facility-administered medications for this visit.      Musculoskeletal: Strength & Muscle Tone: within normal limits Gait & Station: normal Patient leans: N/A  Psychiatric Specialty Exam: Review of Systems  Constitutional: Negative.   HENT: Negative.   Respiratory: Negative.   Cardiovascular: Negative.   Genitourinary: Negative.   Musculoskeletal: Positive for back pain and joint pain.  Skin: Negative.  Negative for rash.  Neurological: Positive for tremors.  Psychiatric/Behavioral: Positive for depression. The patient is nervous/anxious.     There were no vitals taken for this visit.There is no height or weight on file to calculate BMI.  General Appearance: Casual and Tearful  Eye Contact:  Fair  Speech:  Slow  Volume:  Decreased  Mood:  Anxious and Dysphoric  Affect:  Constricted and Depressed  Thought Process:  Goal Directed  Orientation:  Full (Time, Place, and Person)  Thought Content: Rumination   Suicidal Thoughts:  No  Homicidal Thoughts:  No  Memory:  Immediate;   Good Recent;   Good Remote;   Good  Judgement:  Good  Insight:  Good  Psychomotor Activity:  Decreased  Concentration:  Concentration: Fair and Attention Span: Fair  Recall:  Good  Fund of Knowledge: Good  Language: Good  Akathisia:  No  Handed:  Right  AIMS (if indicated): not done  Assets:  Communication Skills Desire for Improvement Housing  ADL's:  Intact  Cognition: WNL  Sleep:  Improved   Screenings: PHQ2-9     Office Visit from 06/06/2017 in [facility] Office Visit from 02/07/2017 in [facility] Office Visit from 11/09/2016 in  [facility] Office Visit from 05/05/2016 in [facility] Office Visit from 02/16/2016 in [facility]  PHQ-2 Total Score  0  0   0  0  0       Assessment and Plan: Major depressive disorder, recurrent.  Panic attacks.  I review her psychosocial stressors in current medication.  Since we added amitriptyline she had improved sleep but she still feels depressed, emotional, tearful and having crying spells.  She feels lonely.  She started seeing therapist in this office.  I recommended to try Lamictal 25 mg daily for 1 week and then 50 mg daily to help her mood symptoms.  Continue Cymbalta 60 mg daily and amitriptyline 25 mg at bedtime.  We will consider lowering the Cymbalta on her next appointment.  Encouraged to keep appointment with therapist.  Described medication side effects that if she developed a rash then she need to stop the medication immediately.  Time spent 25 minutes.  Follow-up in 4-6 weeks.   Jeanette Rauth T., MD 08/24/2017, 2:32 PM

## 2017-08-31 ENCOUNTER — Ambulatory Visit (INDEPENDENT_AMBULATORY_CARE_PROVIDER_SITE_OTHER): Payer: PPO | Admitting: Licensed Clinical Social Worker

## 2017-08-31 ENCOUNTER — Encounter (HOSPITAL_COMMUNITY): Payer: Self-pay | Admitting: Licensed Clinical Social Worker

## 2017-08-31 DIAGNOSIS — F331 Major depressive disorder, recurrent, moderate: Secondary | ICD-10-CM

## 2017-08-31 NOTE — Progress Notes (Signed)
   THERAPIST PROGRESS NOTE  Session Time: 11:10-12pm  Participation Level: Active  Behavioral Response: CasualAlertAnxious and Depressed  Type of Therapy: Individual Therapy  Treatment Goals addressed: Depressive symptoms  Interventions: CBT/Motivational Interviewing  Summary: Sarah Rodriguez is a 61 y.o. female who presents for her first individual counseling session.Pt discussed her psychiatric symptoms and current life events. Pt presented very positive today. She has accepted her plight of disability and chronic back pain. Processed with pt "happiness comes from within." Asked open ended questions about pt's happiness. Pt met with her psychiatrist who put her on Lamictal. She was afraid to take it at first but does like the results. Spent a considerable amount of time teaching pt coping tools for her stressors: meditation, breathing exercises, CALM app, yoga. Pt has begun reaching out to old friends and family to establish positive relationships. Pt is trying to have a better outlook on life.    Suicidal/Homicidal: Nowithout intent/plan  Therapist Response: Assessed pt's current functioning by self report and reviewed progress. Assisted pt processing happiness, medication, coping tools, relationships. Assisted pt processing for the management of her stressors.  Plan: Return again in 2 weeks.. \  Diagnosis: Axis I: MDD, recurrent epsode moderate        Gavina Dildine S, LCAS 08/31/2017

## 2017-09-13 ENCOUNTER — Other Ambulatory Visit (HOSPITAL_COMMUNITY): Payer: Self-pay | Admitting: Psychiatry

## 2017-09-13 DIAGNOSIS — F331 Major depressive disorder, recurrent, moderate: Secondary | ICD-10-CM

## 2017-09-14 ENCOUNTER — Ambulatory Visit (INDEPENDENT_AMBULATORY_CARE_PROVIDER_SITE_OTHER): Payer: PPO | Admitting: Physician Assistant

## 2017-09-14 ENCOUNTER — Encounter: Payer: Self-pay | Admitting: Physician Assistant

## 2017-09-14 VITALS — BP 134/86 | HR 88 | Temp 97.3°F | Resp 14 | Ht 61.0 in | Wt 120.8 lb

## 2017-09-14 DIAGNOSIS — E039 Hypothyroidism, unspecified: Secondary | ICD-10-CM | POA: Diagnosis not present

## 2017-09-14 DIAGNOSIS — M150 Primary generalized (osteo)arthritis: Secondary | ICD-10-CM | POA: Diagnosis not present

## 2017-09-14 DIAGNOSIS — Z79899 Other long term (current) drug therapy: Secondary | ICD-10-CM | POA: Diagnosis not present

## 2017-09-14 DIAGNOSIS — G43109 Migraine with aura, not intractable, without status migrainosus: Secondary | ICD-10-CM | POA: Diagnosis not present

## 2017-09-14 DIAGNOSIS — M8949 Other hypertrophic osteoarthropathy, multiple sites: Secondary | ICD-10-CM

## 2017-09-14 DIAGNOSIS — K5900 Constipation, unspecified: Secondary | ICD-10-CM

## 2017-09-14 DIAGNOSIS — R7303 Prediabetes: Secondary | ICD-10-CM | POA: Diagnosis not present

## 2017-09-14 DIAGNOSIS — Z0000 Encounter for general adult medical examination without abnormal findings: Secondary | ICD-10-CM

## 2017-09-14 DIAGNOSIS — I10 Essential (primary) hypertension: Secondary | ICD-10-CM

## 2017-09-14 DIAGNOSIS — M159 Polyosteoarthritis, unspecified: Secondary | ICD-10-CM

## 2017-09-14 DIAGNOSIS — R05 Cough: Secondary | ICD-10-CM

## 2017-09-14 DIAGNOSIS — E559 Vitamin D deficiency, unspecified: Secondary | ICD-10-CM

## 2017-09-14 DIAGNOSIS — Z0001 Encounter for general adult medical examination with abnormal findings: Secondary | ICD-10-CM

## 2017-09-14 DIAGNOSIS — R059 Cough, unspecified: Secondary | ICD-10-CM

## 2017-09-14 DIAGNOSIS — M4316 Spondylolisthesis, lumbar region: Secondary | ICD-10-CM | POA: Diagnosis not present

## 2017-09-14 DIAGNOSIS — Z23 Encounter for immunization: Secondary | ICD-10-CM | POA: Diagnosis not present

## 2017-09-14 DIAGNOSIS — K219 Gastro-esophageal reflux disease without esophagitis: Secondary | ICD-10-CM | POA: Diagnosis not present

## 2017-09-14 DIAGNOSIS — J45909 Unspecified asthma, uncomplicated: Secondary | ICD-10-CM

## 2017-09-14 DIAGNOSIS — J028 Acute pharyngitis due to other specified organisms: Secondary | ICD-10-CM

## 2017-09-14 DIAGNOSIS — F3341 Major depressive disorder, recurrent, in partial remission: Secondary | ICD-10-CM | POA: Diagnosis not present

## 2017-09-14 DIAGNOSIS — E782 Mixed hyperlipidemia: Secondary | ICD-10-CM

## 2017-09-14 DIAGNOSIS — B3789 Other sites of candidiasis: Secondary | ICD-10-CM

## 2017-09-14 DIAGNOSIS — R6889 Other general symptoms and signs: Secondary | ICD-10-CM

## 2017-09-14 MED ORDER — NYSTATIN 100000 UNIT/ML MT SUSP: 5.0000 mL | Freq: Four times a day (QID) | OROMUCOSAL | 1 refills | Status: DC

## 2017-09-14 NOTE — Progress Notes (Signed)
MEDICARE ANNUAL WELLNESS VISIT AND FOLLOW UP  Assessment:   Essential hypertension - continue medications, DASH diet, exercise and monitor at home. Call if greater than 130/80.   Hypothyroidism, unspecified hypothyroidism type Hypothyroidism-check TSH level, continue medications the same, reminded to take on an empty stomach 30-28mins before food.    Hyperlipidemia -continue medications, check lipids, decrease fatty foods, increase activity.    Prediabetes Decrease carbs   Vitamin D deficiency Continue supplement  Medication management  Asthma, unspecified asthma severity, uncomplicated Continue medications  Gastroesophageal reflux disease, esophagitis presence not specified Continue PPI/H2 blocker, diet discussed   Spondylolisthesis at L4-L5 level Continue follow up ortho   Encounter for Medicare annual wellness exam Schedule MGM will get at GYN, will request colonoscopy report  Depression, major, recurrent, in partial remission ([place]) Continue follow up psych  Needs flu shot -     FLU VACCINE MDCK QUAD W/Preservative  Cough -     DG Chest 2 View; Future - treat gerd, get CXR, continue advair  Constipation, unspecified constipation type -     Celiac Disease Comprehensive Panel with Reflexes - increase fiber, water, walking  Yeast pharyngitis -     nystatin (MYCOSTATIN) 100000 UNIT/ML suspension; Take 5 mLs (500,000 Units total) by mouth 4 (four) times daily. - add MDI to advair - did not tolerate anoro   Over 30 minutes of exam, counseling, chart review, and critical decision making was performed Future Appointments Date Time Provider [place]  09/15/2017 11:00 AM Jenkins Rouge, LCAS [facility] None  09/21/2017 12:15 PM Arfeen, Arlyce Harman, MD [facility] None  09/27/2017 11:00 AM Jenkins Rouge, LCAS [facility] None  12/15/2017 11:30 AM Unk Pinto, MD [facility] None  07/03/2018 2:00 PM Unk Pinto, MD [facility] None    Plan:    During the course of the visit the patient was educated and counseled about appropriate screening and preventive services including:    Pneumococcal vaccine   Influenza vaccine  Td vaccine  Prevnar 13  Screening electrocardiogram  Screening mammography  Bone densitometry screening  Colorectal cancer screening  Diabetes screening  Glaucoma screening  Nutrition counseling   Advanced directives: given info/requested copies   Subjective:   Sarah Rodriguez is a 61 y.o. female who presents for Medicare Annual Wellness Visit and 3 month follow up on hypertension, prediabetes, hyperlipidemia, vitamin D def.   Her blood pressure has been controlled at home, today their BP is BP: 134/86 She does workout. She denies chest pain,  dizziness.  She has had cough, productive x 1 month, worse at night, on robitussin DM. No fever, or chills.  She is on cholesterol medication and denies myalgias. Her cholesterol is at goal. The cholesterol last visit was:   Lab Results  Component Value Date   CHOL 194 06/06/2017   HDL 46 (L) 06/06/2017   LDLCALC 117 (H) 06/06/2017   TRIG 153 (H) 06/06/2017   CHOLHDL 4.2 06/06/2017   She has been working on diet and exercise for prediabetes, and denies paresthesia of the feet, polydipsia, polyuria and visual disturbances. Last A1C in the office was:  Lab Results  Component Value Date   HGBA1C 5.9 (H) 06/06/2017   Last GFR   Lab Results  Component Value Date   GFRNONAA 71 06/06/2017   Patient is on Vitamin D supplement. Lab Results  Component Value Date   VD25OH 83 06/06/2017     She is on thyroid medication. Her medication was not changed last visit.   Lab  Results  Component Value Date   TSH 0.40 06/06/2017  .  She is on pristiq for depression, in remission, on klonopin for sleep.  Follows with Dr. Lenna Gilford for RA versus OA.  Follows with Dr. Ellene Route for her back.  BMI is Body mass index is 22.82 kg/m., she is working on diet and  exercise. Wt Readings from Last 3 Encounters:  09/14/17 120 lb 12.8 oz (54.8 kg)  07/07/17 116 lb 3.2 oz (52.7 kg)  06/06/17 116 lb 6.4 oz (52.8 kg)     Medication Review Current Outpatient Prescriptions on File Prior to Visit  Medication Sig Dispense Refill  . ADVAIR HFA 115-21 MCG/ACT inhaler INHALE 2 PUFFS BY MOUTH EVERY 12 HOURS 10 TO 15 MINUTES APART 3 Inhaler 3  . amitriptyline (ELAVIL) 25 MG tablet Take 1 tablet (25 mg total) by mouth at bedtime. 30 tablet 1  . atorvastatin (LIPITOR) 10 MG tablet TAKE 1 TABLET BY MOUTH EVERY DAY 90 tablet 0  . Cholecalciferol (VITAMIN D PO) Take 4,000 Units by mouth daily.     Marland Kitchen CINNAMON PO Take 1,000 Units by mouth 2 (two) times daily.     . clonazePAM (KLONOPIN) 0.5 MG tablet TAKE 1/2 (ONE-HALF) TO 1 TABLET BY MOUTH UP TO  TWICE DAILY ONLY IF NEEDED FOR AN ANXIETY ATTACK OR SLEEP 60 tablet 0  . Cyanocobalamin (VITAMIN B-12) 2500 MCG SUBL Place 1 tablet under the tongue daily.     . diclofenac sodium (VOLTAREN) 1 % GEL Apply 4 g topically 4 (four) times daily. 100 g 3  . DULoxetine (CYMBALTA) 60 MG capsule Take 1 capsule (60 mg total) by mouth daily. 180 capsule 0  . enalapril (VASOTEC) 20 MG tablet TAKE 1 TABLET BY MOUTH DAILY 90 tablet 1  . fexofenadine-pseudoephedrine (ALLEGRA-D 12 HOUR) 60-120 MG per tablet Take 1 tablet by mouth daily.    . fluocinonide cream (LIDEX) 0.05 % Apply to rash 2 x / day as needed 60 g 3  . fluticasone (FLONASE) 50 MCG/ACT nasal spray SHAKE WELL AND USE 2 SPRAYS IN EACH NOSTRIL EVERY EVENING 48 g 1  . gabapentin (NEURONTIN) 100 MG capsule Take 100 mg by mouth 3 (three) times daily.    Marland Kitchen lamoTRIgine (LAMICTAL) 25 MG tablet Take 1 tab daily for 1 week and than 2 tab daily 60 tablet 1  . levothyroxine (SYNTHROID, LEVOTHROID) 50 MCG tablet TAKE 1 TABLET(50 MCG) BY MOUTH DAILY 90 tablet 0  . Magnesium 250 MG TABS Take 2 tablets by mouth daily.     . meloxicam (MOBIC) 15 MG tablet TAKE 1 TABLET(15 MG) BY MOUTH DAILY 90  tablet 3  . montelukast (SINGULAIR) 10 MG tablet TAKE 1 TABLET(10 MG) BY MOUTH AT BEDTIME 90 tablet 3  . pantoprazole (PROTONIX) 40 MG tablet Take 1 tablet (40 mg total) by mouth 2 (two) times daily. 180 tablet 1  . promethazine (PHENERGAN) 25 MG tablet Take 1 tablet (25 mg total) by mouth every 4 (four) hours as needed for nausea or vomiting. 100 tablet 0   No current facility-administered medications on file prior to visit.     Current Problems (verified) Patient Active Problem List   Diagnosis Date Noted  . Depression, major, recurrent, in partial remission ([place]) 08/05/2016  . Degenerative joint disease 08/05/2016  . Migraine 03/03/2016  . Encounter for Medicare annual wellness exam 02/16/2016  . Spondylolisthesis at L4-L5 level 10/27/2015  . Prediabetes 12/12/2014  . Vitamin D deficiency 04/17/2014  . Medication management 04/17/2014  .  Hyperlipidemia   . Hypertension   . Asthma   . GERD (gastroesophageal reflux disease)   . Hypothyroidism     Screening Tests Immunization History  Administered Date(s) Administered  . Influenza Inj Mdck Quad With Preservative 09/14/2017  . Influenza,inj,quad, With Preservative 11/09/2016  . Influenza-Unspecified 09/18/2013  . PPD Test 04/17/2014  . Pneumococcal-Unspecified 11/30/1995  . Td 08/27/2005, 05/05/2016    Preventative care: Last colonoscopy: will request records, wants to do cologuard when do Last mammogram: scheduled Nov 6th at Dr. Valentino Saxon Last pap smear/pelvic exam: 2012 declines another  DEXA: declines Stress test 2006 EGD 2014, Dr. Cristina Gong  Prior vaccinations: TD or Tdap: 2017 Influenza: 2018  Pneumococcal: 1997 Prevnar13: age 82 Shingles/Zostavax: declines  Names of Other Physician/Practitioners you currently use: 1. [facility]- here for primary care 2. Dr. Georgina Snell, eye doctor, last visit 2017, wears glasses.  Patient Care Team: Unk Pinto, MD as PCP - General  (Internal Medicine)  Allergies Allergies  Allergen Reactions  . Demerol [Meperidine] Other (See Comments)    REACTION: unknown--itching  . Epinephrine Other (See Comments)    "Sensitivity."  . Niacin And Related Rash    SURGICAL HISTORY She  has a past surgical history that includes Dilation and curettage of uterus (2011); Ablation (2011); Cholecystectomy (2008); Tonsillectomy; buttocks surgery; Cesarean section (1992); right knee arthroscopy; Cervical fusion (03/2010); Colonoscopy; Esophagogastroduodenoscopy; and Lumbar laminectomy/decompression microdiscectomy (10/24/2012). FAMILY HISTORY Her family history includes Allergies in her mother; Arthritis in her mother; Depression in her mother; Heart attack in her father and mother; Hypertension in her mother. SOCIAL HISTORY She  reports that she has quit smoking. She has never used smokeless tobacco. She reports that she does not drink alcohol or use drugs.  MEDICARE WELLNESS OBJECTIVES: Physical activity: Current Exercise Habits: Home exercise routine, Time (Minutes): 30, Frequency (Times/Week): 7, Weekly Exercise (Minutes/Week): 210, Intensity: Mild Cardiac risk factors: Cardiac Risk Factors include: dyslipidemia;hypertension Depression/mood screen:   Depression screen [facility] 2/9 09/14/2017  Decreased Interest 0  Down, Depressed, Hopeless 0  PHQ - 2 Score 0    ADLs:  In your present state of health, do you have any difficulty performing the following activities: 09/14/2017 06/05/2017  Hearing? N N  Vision? N N  Difficulty concentrating or making decisions? N N  Walking or climbing stairs? N N  Dressing or bathing? N N  Doing errands, shopping? N N  Some recent data might be hidden     Cognitive Testing  Alert? Yes  Normal Appearance?Yes  Oriented to person? Yes  Place? Yes   Time? Yes  Recall of three objects?  Yes  Can perform simple calculations? Yes  Displays appropriate judgment?Yes  Can read the correct time from a watch  face?Yes  EOL planning: Does Patient Have a Medical Advance Directive?: No Would patient like information on creating a medical advance directive?: Yes (MAU/Ambulatory/Procedural Areas - Information given)   Objective:   Today's Vitals   09/14/17 1132  BP: 134/86  Pulse: 88  Resp: 14  Temp: (!) 97.3 F (36.3 C)  SpO2: 96%  Weight: 120 lb 12.8 oz (54.8 kg)  Height: 5\' 1"  (1.549 m)  PainSc: 5   PainLoc: Back   Body mass index is 22.82 kg/m.  General appearance: alert, no distress, WD/WN,  female HEENT: normocephalic, sclerae anicteric, TMs pearly, + bilateral maxiallry sinus tenderness right worse than left nares patent, no discharge or erythema, pharynx normal Oral cavity: MMM, no lesions Neck: supple, no lymphadenopathy, no thyromegaly,  no masses Heart: RRR, normal S1, S2, no murmurs Lungs: CTA bilaterally, no wheezes, rhonchi, or rales Abdomen: +bs, soft, non tender, non distended, no masses, no hepatomegaly, no splenomegaly Musculoskeletal: nontender, no swelling, no obvious deformity Extremities: no edema, no cyanosis, no clubbing Pulses: 2+ symmetric, upper and lower extremities, normal cap refill Neurological: alert, oriented x 3, CN2-12 intact, strength normal upper extremities and lower extremities, sensation normal throughout, DTRs 2+ throughout, no cerebellar signs, gait normal Psychiatric: normal affect, behavior normal, pleasant  Breast: defer Gyn: defer Rectal: defer   Medicare Attestation I have personally reviewed: The patient's medical and social history Their use of alcohol, tobacco or illicit drugs Their current medications and supplements The patient's functional ability including ADLs,fall risks, home safety risks, cognitive, and hearing and visual impairment Diet and physical activities Evidence for depression or mood disorders  The patient's weight, height, BMI, and visual acuity have been recorded in the chart.  I have made referrals, counseling,  and provided education to the patient based on review of the above and I have provided the patient with a written personalized care plan for preventive services.     Vicie Mutters, PA-C   09/14/2017

## 2017-09-14 NOTE — Patient Instructions (Addendum)
Can drink apple cider vinegar Can do [place] at groceries stores Can do probiotic Need to cut back on carbs for yeast overgrowth syndrome  About Constipation  Constipation Overview Constipation is the most common gastrointestinal complaint - about 4 million Americans experience constipation and make 2.5 million physician visits a year to get help for the problem.  Constipation can occur when the colon absorbs too much water, the colon's muscle contraction is slow or sluggish, and/or there is delayed transit time through the colon.  The result is stool that is hard and dry.  Indicators of constipation include straining during bowel movements greater than 25% of the time, having fewer than three bowel movements per week, and/or the feeling of incomplete evacuation.  There are established guidelines ([place] II ) for defining constipation. A person needs to have two or more of the following symptoms for at least 12 weeks (not necessarily consecutive) in the preceding 12 months: . Straining in  greater than 25% of bowel movements . Lumpy or hard stools in greater than 25% of bowel movements . Sensation of incomplete emptying in greater than 25% of bowel movements . Sensation of anorectal obstruction/blockade in greater than 25% of bowel movements . Manual maneuvers to help empty greater than 25% of bowel movements (e.g., digital evacuation, support of the pelvic floor)  . Less than  3 bowel movements/week . Loose stools are not present, and criteria for irritable bowel syndrome are insufficient  Common Causes of Constipation . Lack of fiber in your diet . Lack of physical activity . Medications, including iron and calcium supplements  . Dairy intake . Dehydration . Abuse of laxatives  Travel  Irritable Bowel Syndrome  Pregnancy  Luteal phase of menstruation (after ovulation and before menses)  Colorectal problems  Intestinal Dysfunction  Treating Constipation  There are several  ways of treating constipation, including changes to diet and exercise, use of laxatives, adjustments to the pelvic floor, and scheduled toileting.  These treatments include: . increasing fiber and fluids in the diet  . increasing physical activity . learning muscle coordination   learning proper toileting techniques and toileting modifications   designing and sticking  to a toileting schedule     2007, [employer] Doc.22    Oral Thrush, Adult Oral thrush, also called oral candidiasis, is a fungal infection that develops in the mouth and throat and on the tongue. It causes white patches to form on the mouth and tongue. Sarah Rodriguez is most common in older adults, but it can occur at any age. Many cases of thrush are mild, but this infection can also be serious. Sarah Rodriguez can be a repeated (recurrent) problem for certain people who have a weak body defense system (immune system). The weakness can be caused by chronic illnesses, or by taking medicines that limit the body's ability to fight infection. If a person has difficulty fighting infection, the fungus that causes thrush can spread through the body. This can cause life-threatening blood or organ infections. What are the causes? This condition is caused by a fungus (yeast) called Candida albicans.  This fungus is normally present in small amounts in the mouth and on other mucous membranes. It usually causes no harm.  If conditions are present that allow the fungus to grow without control, it invades surrounding tissues and becomes an infection.  Other Candida species can also lead to thrush (rare).  What increases the risk? This condition is more likely to develop in:  People with a weakened immune  system.  Older adults.  People with HIV (human immunodeficiency virus).  People with diabetes.  People with dry mouth (xerostomia).  Pregnant women.  People with poor dental care, especially people who have false  teeth.  People who use antibiotic medicines.  What are the signs or symptoms? Symptoms of this condition can vary from mild and moderate to severe and persistent. Symptoms may include:  A burning feeling in the mouth and throat. This can occur at the start of a thrush infection.  White patches that stick to the mouth and tongue. The tissue around the patches may be red, raw, and painful. If rubbed (during tooth brushing, for example), the patches and the tissue of the mouth may bleed easily.  A bad taste in the mouth or difficulty tasting foods.  A cottony feeling in the mouth.  Pain during eating and swallowing.  Poor appetite.  Cracking at the corners of the mouth.  How is this diagnosed? This condition is diagnosed based on:  Physical exam. Your health care provider will look in your mouth.  Health history. Your health care provider will ask you questions about your health.  How is this treated? This condition is treated with medicines called antifungals, which prevent the growth of fungi. These medicines are either applied directly to the affected area (topical) or swallowed (oral). The treatment will depend on the severity of the condition. Mild thrush Mild cases of thrush may clear up with the use of an antifungal mouth rinse or lozenges. Treatment usually lasts about 14 days. Moderate to severe thrush  More severe thrush infections that have spread to the esophagus are treated with an oral antifungal medicine. A topical antifungal medicine may also be used.  For some severe infections, treatment may need to continue for more than 14 days.  Oral antifungal medicines are rarely used during pregnancy because they may be harmful to the unborn child. If you are pregnant, talk with your health care provider about options for treatment. Persistent or recurrent thrush For cases of thrush that do not go away or keep coming back:  Treatment may be needed twice as long as the  symptoms last.  Treatment will include both oral and topical antifungal medicines.  People with a weakened immune system can take an antifungal medicine on a continuous basis to prevent thrush infections.  It is important to treat conditions that make a person more likely to get thrush, such as diabetes or HIV. Follow these instructions at home: Medicines  Take over-the-counter and prescription medicines only as told by your health care provider.  Talk with your health care provider about an over-the-counter medicine called gentian violet, which kills bacteria and fungi. Relieving soreness and discomfort To help reduce the discomfort of thrush:  Drink cold liquids such as water or iced tea.  Try flavored ice treats or frozen juices.  Eat foods that are easy to swallow, such as gelatin, ice cream, or custard.  Try drinking from a straw if the patches in your mouth are painful.  General instructions  Eat plain, unflavored yogurt as directed by your health care provider. Check the label to make sure the yogurt contains live cultures. This yogurt can help healthy bacteria to grow in the mouth and can stop the growth of the fungus that causes thrush.  If you wear dentures, remove the dentures before going to bed, brush them vigorously, and soak them in a cleaning solution as directed by your health care provider.  Rinse your  mouth with a warm salt-water mixture several times a day. To make a salt-water mixture, completely dissolve 1/2-1 tsp of salt in 1 cup of warm water. Contact a health care provider if:  Your symptoms are getting worse or are not improving within 7 days of starting treatment.  You have symptoms of a spreading infection, such as white patches on the skin outside of the mouth. This information is not intended to replace advice given to you by your health care provider. Make sure you discuss any questions you have with your health care provider. Document Released:  08/10/2004 Document Revised: 08/09/2016 Document Reviewed: 08/09/2016 Elsevier Interactive Patient Education  2017 [employer].

## 2017-09-15 ENCOUNTER — Ambulatory Visit (INDEPENDENT_AMBULATORY_CARE_PROVIDER_SITE_OTHER): Payer: PPO | Admitting: Licensed Clinical Social Worker

## 2017-09-15 DIAGNOSIS — F331 Major depressive disorder, recurrent, moderate: Secondary | ICD-10-CM

## 2017-09-15 LAB — BASIC METABOLIC PANEL WITH GFR
BUN: 14 mg/dL (ref 7–25)
CO2: 31 mmol/L (ref 20–32)
Calcium: 9.5 mg/dL (ref 8.6–10.4)
Chloride: 107 mmol/L (ref 98–110)
Creat: 0.83 mg/dL (ref 0.50–0.99)
GFR, Est African American: 89 mL/min/{1.73_m2} (ref >=60)
GFR, Est Non African American: 77 mL/min/{1.73_m2} (ref >=60)
Glucose, Bld: 107 mg/dL — ABNORMAL HIGH (ref 65–99)
Potassium: 4.6 mmol/L (ref 3.5–5.3)
Sodium: 143 mmol/L (ref 135–146)

## 2017-09-15 LAB — CBC WITH DIFFERENTIAL/PLATELET
Basophils Absolute: 39 cells/uL (ref 0–200)
Basophils Relative: 0.7 %
Eosinophils Absolute: 207 cells/uL (ref 15–500)
Eosinophils Relative: 3.7 %
HCT: 35.1 % (ref 35.0–45.0)
Hemoglobin: 11.6 g/dL — ABNORMAL LOW (ref 11.7–15.5)
Lymphs Abs: 1495 cells/uL (ref 850–3900)
MCH: 27.8 pg (ref 27.0–33.0)
MCHC: 33 g/dL (ref 32.0–36.0)
MCV: 84.2 fL (ref 80.0–100.0)
MPV: 9.9 fL (ref 7.5–12.5)
Monocytes Relative: 8.2 %
Neutro Abs: 3399 cells/uL (ref 1500–7800)
Neutrophils Relative %: 60.7 %
Platelets: 287 10*3/uL (ref 140–400)
RBC: 4.17 10*6/uL (ref 3.80–5.10)
RDW: 13.1 % (ref 11.0–15.0)
Total Lymphocyte: 26.7 %
WBC mixed population: 459 cells/uL (ref 200–950)
WBC: 5.6 10*3/uL (ref 3.8–10.8)

## 2017-09-15 LAB — HEMOGLOBIN A1C
Hgb A1c MFr Bld: 5.9 % of total Hgb — ABNORMAL HIGH (ref <5.7)
Mean Plasma Glucose: 123 (calc)
eAG (mmol/L): 6.8 (calc)

## 2017-09-15 LAB — HEPATIC FUNCTION PANEL
AG Ratio: 2 (calc) (ref 1.0–2.5)
ALT: 15 U/L (ref 6–29)
AST: 19 U/L (ref 10–35)
Albumin: 4.3 g/dL (ref 3.6–5.1)
Alkaline phosphatase (APISO): 65 U/L (ref 33–130)
Bilirubin, Direct: 0.1 mg/dL (ref 0.0–0.2)
Globulin: 2.2 g/dL (calc) (ref 1.9–3.7)
Indirect Bilirubin: 0.2 mg/dL (calc) (ref 0.2–1.2)
Total Bilirubin: 0.3 mg/dL (ref 0.2–1.2)
Total Protein: 6.5 g/dL (ref 6.1–8.1)

## 2017-09-15 LAB — LIPID PANEL
Cholesterol: 162 mg/dL (ref <200)
HDL: 39 mg/dL — ABNORMAL LOW (ref >50)
LDL Cholesterol (Calc): 95 mg/dL (calc)
Non-HDL Cholesterol (Calc): 123 mg/dL (calc) (ref <130)
Total CHOL/HDL Ratio: 4.2 (calc) (ref <5.0)
Triglycerides: 179 mg/dL — ABNORMAL HIGH (ref <150)

## 2017-09-15 LAB — CELIAC DISEASE COMPREHENSIVE PANEL WITH REFLEXES
(tTG) Ab, IgA: 1 U/mL
Immunoglobulin A: 111 mg/dL (ref 81–463)

## 2017-09-15 LAB — MAGNESIUM: Magnesium: 2 mg/dL (ref 1.5–2.5)

## 2017-09-15 LAB — TSH: TSH: 0.93 mIU/L (ref 0.40–4.50)

## 2017-09-19 ENCOUNTER — Encounter: Payer: Self-pay | Admitting: Physician Assistant

## 2017-09-19 ENCOUNTER — Encounter (HOSPITAL_COMMUNITY): Payer: Self-pay | Admitting: Licensed Clinical Social Worker

## 2017-09-19 NOTE — Progress Notes (Signed)
   THERAPIST PROGRESS NOTE  Session Time: 11:10-12pm  Participation Level: Active  Behavioral Response: CasualAlertAnxious and Depressed  Type of Therapy: Individual Therapy  Treatment Goals addressed: Depressive symptoms  Interventions: CBT/Motivational Interviewing  Summary: Sarah Rodriguez is a 61 y.o. female who presents for her individual counseling session.Pt discussed her psychiatric symptoms and current life events. Pt presented very negative today. She was lamenting about her loss of support and social life. She has a contentious relationship with her brother which makes it difficult when she goes home to visit her father.  Processed again with pt "happiness comes from within." Asked open ended questions about pt's happiness.Refresher with pt teaching pt coping tools for her stressors: meditation, breathing exercises, CALM app, yoga. Pt has begun reaching out to old friends and family to establish positive relationships, but it is a struggle and does not come naturally. Discussed with pt how relationships don't just happen but work must be put into them to maintain a healthy relationship.     Suicidal/Homicidal: Nowithout intent/plan  Therapist Response: Assessed pt's current functioning by self report and reviewed progress. Assisted pt processing happiness, , coping tools, relationships. Assisted pt processing for the management of her stressors.  Plan: Return again in 2 weeks.. \  Diagnosis: Axis I: MDD, recurrent epsode moderate        Ashlen Kiger S, LCAS  09/15/17

## 2017-09-21 ENCOUNTER — Ambulatory Visit (INDEPENDENT_AMBULATORY_CARE_PROVIDER_SITE_OTHER): Payer: PPO | Admitting: Psychiatry

## 2017-09-21 ENCOUNTER — Encounter (HOSPITAL_COMMUNITY): Payer: Self-pay | Admitting: Psychiatry

## 2017-09-21 DIAGNOSIS — F331 Major depressive disorder, recurrent, moderate: Secondary | ICD-10-CM

## 2017-09-21 DIAGNOSIS — Z818 Family history of other mental and behavioral disorders: Secondary | ICD-10-CM | POA: Diagnosis not present

## 2017-09-21 DIAGNOSIS — Z87891 Personal history of nicotine dependence: Secondary | ICD-10-CM | POA: Diagnosis not present

## 2017-09-21 DIAGNOSIS — R4583 Excessive crying of child, adolescent or adult: Secondary | ICD-10-CM | POA: Diagnosis not present

## 2017-09-21 DIAGNOSIS — F419 Anxiety disorder, unspecified: Secondary | ICD-10-CM

## 2017-09-21 MED ORDER — LAMOTRIGINE 25 MG PO TABS: ORAL_TABLET | ORAL | 0 refills | Status: DC

## 2017-09-21 MED ORDER — AMITRIPTYLINE HCL 25 MG PO TABS: 25.0000 mg | ORAL_TABLET | Freq: Every day | ORAL | 0 refills | Status: DC

## 2017-09-21 NOTE — Progress Notes (Signed)
[facility] MD/PA/NP OP Progress Note  09/21/2017 12:52 PM Sarah Rodriguez  MRN:  [id number]  Chief Complaint: I'm taking 1 Lamictal.when I took to it makes me depressed.  HPI: Sarah Rodriguez came for her follow-up appointment.  On her last visit we prescribe Lamictal 25 mg to take for 1 week and then 50 mg.  However when she took 50 mg she started feeling sad and depressed.  She cut down to 1 tablet and she is feeling much better.  She believe her depression, anxiety and crying spells are much at her and she sleeping good.  She still have issues with her brother who lives with the father.  Since taking Lamictal she denies any trying spells or any feeling of hopelessness or worthlessness.  She is seeing Sarah Rodriguez for counseling.  Patient has chronic pain including tingling and joint pain and she has history of back and neck surgery.  She lives with her husband who is supportive but she wish they can do things more on a regular basis.  Patient denies any rash, itching, tremors or shakes.  She is taking amitriptyline at bedtime is helping her sleep.  Energy level is good.  Her vital signs are stable.  Visit Diagnosis:    ICD-10-CM   1. MDD (major depressive disorder), recurrent episode, moderate (HCC) F33.1 lamoTRIgine (LAMICTAL) 25 MG tablet    amitriptyline (ELAVIL) 25 MG tablet    Past Psychiatric History: Reviewed. Patient reported history of depression and has been taking antidepressants since 1996.  She had a history of panic attacks while driving.  She was seeing Dr. Letta Rodriguez and tried Prozac, Paxil, Zoloft, Xanax, Cymbalta and Klonopin.  Patient denies any history of psychiatric inpatient treatment or any suicidal attempt.  Past Medical History:  Past Medical History:  Diagnosis Date  . Asthma    Dulera daily  . Chronic back pain    HNP  . Complication of anesthesia    slow to wake up  . Depression    takes Clonazepam nightly  . GERD (gastroesophageal reflux disease)    takes Protonix  daily   . Heart murmur   . History of migraine    takes Relpax daily as needed  . HSV (herpes simplex virus) infection   . Hyperlipidemia    takes Atorvastatin daily  . Hypertension    takes Benicar daily  . Hypothyroidism    takes Synthroid daily  . MVP (mitral valve prolapse)   . Pneumonia    hx of--been many yrs ago  . Seasonal allergies    takes Allegra daily  . Shortness of breath    with exertion    Past Surgical History:  Procedure Laterality Date  . ABLATION  2011  . buttocks surgery     at age 69  . CERVICAL FUSION  03/2010  . CESAREAN SECTION  1992  . CHOLECYSTECTOMY  2008  . COLONOSCOPY    . DILATION AND CURETTAGE OF UTERUS  2011  . ESOPHAGOGASTRODUODENOSCOPY    . LUMBAR LAMINECTOMY/DECOMPRESSION MICRODISCECTOMY  10/24/2012   Procedure: LUMBAR LAMINECTOMY/DECOMPRESSION MICRODISCECTOMY 1 LEVEL;  Surgeon: Sarah Miss, MD;  Location: [facility];  Service: Neurosurgery;  Laterality: Right;  Right Lumbar five-Sacral One Microdiskectomy  . right knee arthroscopy    . TONSILLECTOMY     at age 25    Family Psychiatric History: reviewed.  Family History:  Family History  Problem Relation Age of Onset  . Arthritis Mother   . Heart attack Mother   .  Depression Mother   . Allergies Mother   . Hypertension Mother   . Heart attack Father     Social History:  Social History   Social History  . Marital status: Married    Spouse name: N/A  . Number of children: N/A  . Years of education: N/A   Social History Main Topics  . Smoking status: Former Research scientist (life sciences)  . Smokeless tobacco: Never Used     Comment: quit at age 26  . Alcohol use No     Comment: 2-3 times a yr  . Drug use: No  . Sexual activity: Yes    Birth control/ protection: Post-menopausal   Other Topics Concern  . Not on file   Social History Narrative  . No narrative on file    Allergies:  Allergies  Allergen Reactions  . Demerol [Meperidine] Other (See Comments)    REACTION:  unknown--itching  . Epinephrine Other (See Comments)    "Sensitivity."  . Niacin And Related Rash    Metabolic Disorder Labs: Lab Results  Component Value Date   HGBA1C 5.9 (H) 09/14/2017   MPG 123 09/14/2017   MPG 123 06/06/2017   No results found for: PROLACTIN Lab Results  Component Value Date   CHOL 162 09/14/2017   TRIG 179 (H) 09/14/2017   HDL 39 (L) 09/14/2017   CHOLHDL 4.2 09/14/2017   VLDL 31 (H) 06/06/2017   LDLCALC 117 (H) 06/06/2017   LDLCALC 92 02/07/2017   Lab Results  Component Value Date   TSH 0.93 09/14/2017   TSH 0.40 06/06/2017    Therapeutic Level Labs: No results found for: LITHIUM No results found for: VALPROATE No components found for:  CBMZ  Current Medications: Current Outpatient Prescriptions  Medication Sig Dispense Refill  . ADVAIR HFA 115-21 MCG/ACT inhaler INHALE 2 PUFFS BY MOUTH EVERY 12 HOURS 10 TO 15 MINUTES APART 3 Inhaler 3  . amitriptyline (ELAVIL) 25 MG tablet Take 1 tablet (25 mg total) by mouth at bedtime. 30 tablet 1  . atorvastatin (LIPITOR) 10 MG tablet TAKE 1 TABLET BY MOUTH EVERY DAY 90 tablet 0  . Cholecalciferol (VITAMIN D PO) Take 4,000 Units by mouth daily.     Marland Kitchen CINNAMON PO Take 1,000 Units by mouth 2 (two) times daily.     . clonazePAM (KLONOPIN) 0.5 MG tablet TAKE 1/2 (ONE-HALF) TO 1 TABLET BY MOUTH UP TO  TWICE DAILY ONLY IF NEEDED FOR AN ANXIETY ATTACK OR SLEEP 60 tablet 0  . Cyanocobalamin (VITAMIN B-12) 2500 MCG SUBL Place 1 tablet under the tongue daily.     . diclofenac sodium (VOLTAREN) 1 % GEL Apply 4 g topically 4 (four) times daily. 100 g 3  . DULoxetine (CYMBALTA) 60 MG capsule Take 1 capsule (60 mg total) by mouth daily. 180 capsule 0  . enalapril (VASOTEC) 20 MG tablet TAKE 1 TABLET BY MOUTH DAILY 90 tablet 1  . fexofenadine-pseudoephedrine (ALLEGRA-D 12 HOUR) 60-120 MG per tablet Take 1 tablet by mouth daily.    . fluocinonide cream (LIDEX) 0.05 % Apply to rash 2 x / day as needed 60 g 3  . fluticasone  (FLONASE) 50 MCG/ACT nasal spray SHAKE WELL AND USE 2 SPRAYS IN EACH NOSTRIL EVERY EVENING 48 g 1  . gabapentin (NEURONTIN) 100 MG capsule Take 100 mg by mouth 3 (three) times daily.    Marland Kitchen lamoTRIgine (LAMICTAL) 25 MG tablet Take 1 tab daily for 1 week and than 2 tab daily 60 tablet 1  . levothyroxine (  SYNTHROID, LEVOTHROID) 50 MCG tablet TAKE 1 TABLET(50 MCG) BY MOUTH DAILY 90 tablet 0  . Magnesium 250 MG TABS Take 2 tablets by mouth daily.     . meloxicam (MOBIC) 15 MG tablet TAKE 1 TABLET(15 MG) BY MOUTH DAILY 90 tablet 3  . montelukast (SINGULAIR) 10 MG tablet TAKE 1 TABLET(10 MG) BY MOUTH AT BEDTIME 90 tablet 3  . nystatin (MYCOSTATIN) 100000 UNIT/ML suspension Take 5 mLs (500,000 Units total) by mouth 4 (four) times daily. 473 mL 1  . pantoprazole (PROTONIX) 40 MG tablet Take 1 tablet (40 mg total) by mouth 2 (two) times daily. 180 tablet 1  . promethazine (PHENERGAN) 25 MG tablet Take 1 tablet (25 mg total) by mouth every 4 (four) hours as needed for nausea or vomiting. 100 tablet 0   No current facility-administered medications for this visit.      Musculoskeletal: Strength & Muscle Tone: within normal limits Gait & Station: normal Patient leans: N/A  Psychiatric Specialty Exam: ROS  Blood pressure 118/80, pulse 80, height 5\' 1"  (1.549 m), weight 119 lb 9.6 oz (54.3 kg).There is no height or weight on file to calculate BMI.  General Appearance: Casual  Eye Contact:  Good  Speech:  Clear and Coherent  Volume:  Normal  Mood:  Anxious  Affect:  Appropriate  Thought Process:  Goal Directed  Orientation:  Full (Time, Place, and Person)  Thought Content: Logical   Suicidal Thoughts:  No  Homicidal Thoughts:  No  Memory:  Immediate;   Good Recent;   Good Remote;   Good  Judgement:  Good  Insight:  Good  Psychomotor Activity:  Normal  Concentration:  Concentration: Good and Attention Span: Good  Recall:  Good  Fund of Knowledge: Good  Language: Good  Akathisia:  No   Handed:  Right  AIMS (if indicated): not done  Assets:  Communication Skills Desire for Improvement Housing  ADL's:  Intact  Cognition: WNL  Sleep:  Good   Screenings: PHQ2-9     Office Visit from 09/14/2017 in [facility] [place] Office Visit from 06/06/2017 in [facility] [place] Office Visit from 02/07/2017 in [facility] [place] Office Visit from 11/09/2016 in [facility] [place] Office Visit from 05/05/2016 in [facility]  PHQ-2 Total Score  0  0  0  0  0       Assessment and Plan: Major depressive disorder, recurrent.  Patient doing better with Lamictal but she is only taking 1 tablet. She tried 50 mg but it makes her more sad and depressed.  Recommended to continue 25 mg daily since it is working very well.  Continue amitriptyline 25 mg at bedtime. She is getting Cymbalta and Klonopin from her primary care physician.  Encouraged to keep appointment with Sarah Rodriguez for counseling.  Recommended to call us back if she has any question or any concern.  Follow-up in 3 months.    Jaydyn Bozzo T., MD 09/21/2017, 12:52 PM

## 2017-09-27 ENCOUNTER — Ambulatory Visit (INDEPENDENT_AMBULATORY_CARE_PROVIDER_SITE_OTHER): Payer: PPO | Admitting: Licensed Clinical Social Worker

## 2017-09-27 ENCOUNTER — Encounter (HOSPITAL_COMMUNITY): Payer: Self-pay | Admitting: Licensed Clinical Social Worker

## 2017-09-27 DIAGNOSIS — F331 Major depressive disorder, recurrent, moderate: Secondary | ICD-10-CM

## 2017-09-27 NOTE — Progress Notes (Signed)
   THERAPIST PROGRESS NOTE  Session Time: 11:10-12pm  Participation Level: Active  Behavioral Response: CasualAlertAnxious and Depressed  Type of Therapy: Individual Therapy  Treatment Goals addressed: Depressive symptoms  Interventions: CBT/Motivational Interviewing  Summary: Sarah Rodriguez is a 61 y.o. female who presents for her individual counseling session.Pt discussed her psychiatric symptoms and current life events. Pt was somewhat less negative today. She still lamented about her loss of social support. Pt struggles to relate to other and lacks insight. She blames everyone else for her lack of social circles. Used radical honesty in the discussion. Discussed with pt the need to transfer her to another therapist due to her insurance. Pt was concerned about opening up to a new therapist. Gave pt immediate feedback on the progress she has begun to make and therapy. Asked open ended questions about her future goals for self. Pt said she would call back if she decided to return to the practice.   Suicidal/Homicidal: Nowithout intent/plan  Therapist Response: Assessed pt's current functioning by self report and reviewed progress. Assisted pt processing happiness, relationships. Assisted pt processing for the management of her stressors.  Plan: Return again in 2 weeks.. \  Diagnosis: Axis I: MDD, recurrent epsode moderate        Ruthvik Barnaby S, LCAS  09/27/17

## 2017-09-29 ENCOUNTER — Other Ambulatory Visit: Payer: Self-pay | Admitting: Physician Assistant

## 2017-10-04 DIAGNOSIS — L292 Pruritus vulvae: Secondary | ICD-10-CM | POA: Diagnosis not present

## 2017-10-04 DIAGNOSIS — Z01419 Encounter for gynecological examination (general) (routine) without abnormal findings: Secondary | ICD-10-CM | POA: Diagnosis not present

## 2017-10-04 DIAGNOSIS — Z1231 Encounter for screening mammogram for malignant neoplasm of breast: Secondary | ICD-10-CM | POA: Diagnosis not present

## 2017-10-04 LAB — HM MAMMOGRAPHY

## 2017-10-11 ENCOUNTER — Ambulatory Visit (HOSPITAL_COMMUNITY): Payer: Self-pay | Admitting: Licensed Clinical Social Worker

## 2017-10-24 ENCOUNTER — Encounter: Payer: Self-pay | Admitting: Internal Medicine

## 2017-10-25 ENCOUNTER — Ambulatory Visit (HOSPITAL_COMMUNITY): Payer: Self-pay | Admitting: Licensed Clinical Social Worker

## 2017-12-06 ENCOUNTER — Telehealth (HOSPITAL_COMMUNITY): Payer: Self-pay

## 2017-12-06 NOTE — Telephone Encounter (Signed)
Advice - Telephone call with patient to follow up on concerns regarding past bills she was getting that was now up to $80.  Pt. reported coming to this practice thinking co-payment was $10 but later learned was $40.  Patient reported she is on a very limited budget and had she known the cost would be $40 with ech visit she would not have made so many initial appointments for therapy and for Dr. Adele Schilder.  States she understands now that from this point forward she will have to pay $40 when coming in for appointments as we cannot change her insurance's approved co-payment amounts and agreed to see is there was anything we could do to assist with her current $80 hardship back costs that she was not aware of initially she would later be charged.  Patient stated she was told at her first appointments her co-payment would be $10 and that is what she paid each time but now owes [place] $80 in past fees.  Agreed to send information to administrator to see what options we had to assist with hardship and patient agreed with plan.  Patient stated she now is more careful about how much she is being seen and can pay $40 due with each visit.

## 2017-12-15 ENCOUNTER — Ambulatory Visit: Payer: Self-pay | Admitting: Internal Medicine

## 2017-12-19 ENCOUNTER — Other Ambulatory Visit: Payer: Self-pay | Admitting: Adult Health

## 2017-12-19 ENCOUNTER — Other Ambulatory Visit: Payer: Self-pay | Admitting: Internal Medicine

## 2017-12-21 ENCOUNTER — Encounter: Payer: Self-pay | Admitting: Internal Medicine

## 2017-12-21 ENCOUNTER — Ambulatory Visit (INDEPENDENT_AMBULATORY_CARE_PROVIDER_SITE_OTHER): Payer: PPO | Admitting: Internal Medicine

## 2017-12-21 ENCOUNTER — Other Ambulatory Visit: Payer: Self-pay | Admitting: *Deleted

## 2017-12-21 VITALS — BP 140/78 | HR 80 | Temp 97.6°F | Resp 16 | Ht 61.0 in | Wt 119.6 lb

## 2017-12-21 DIAGNOSIS — G8929 Other chronic pain: Secondary | ICD-10-CM

## 2017-12-21 DIAGNOSIS — M549 Dorsalgia, unspecified: Secondary | ICD-10-CM

## 2017-12-21 DIAGNOSIS — I10 Essential (primary) hypertension: Secondary | ICD-10-CM | POA: Diagnosis not present

## 2017-12-21 DIAGNOSIS — Z79899 Other long term (current) drug therapy: Secondary | ICD-10-CM

## 2017-12-21 DIAGNOSIS — R7309 Other abnormal glucose: Secondary | ICD-10-CM | POA: Diagnosis not present

## 2017-12-21 DIAGNOSIS — E559 Vitamin D deficiency, unspecified: Secondary | ICD-10-CM

## 2017-12-21 DIAGNOSIS — E782 Mixed hyperlipidemia: Secondary | ICD-10-CM | POA: Diagnosis not present

## 2017-12-21 DIAGNOSIS — R7303 Prediabetes: Secondary | ICD-10-CM | POA: Diagnosis not present

## 2017-12-21 DIAGNOSIS — E039 Hypothyroidism, unspecified: Secondary | ICD-10-CM

## 2017-12-21 DIAGNOSIS — K219 Gastro-esophageal reflux disease without esophagitis: Secondary | ICD-10-CM | POA: Diagnosis not present

## 2017-12-21 MED ORDER — FLUTICASONE PROPIONATE 50 MCG/ACT NA SUSP: NASAL | 2 refills | Status: DC

## 2017-12-21 NOTE — Patient Instructions (Signed)

## 2017-12-21 NOTE — Progress Notes (Signed)
This very nice 62 y.o. MWF presents for 6 month follow up with Hypertension, Hyperlipidemia, Pre-Diabetes and Vitamin D Deficiency. Patient has GERD controlled on her meds. She also has Chronic Pain Syndrome consequent of Cx DDD and Cx HNP surg in 2011 and then in 2013, she underwent L5/S1 Surg by Dr Ellene Route and lastly in Nov 2016, she had a Lumbar fusion. Fortunately she is able to control her pain on Gabapentin and avoid Opioids.      Patient is treated for HTN (2007) & BP has been controlled at home. Today's BP is at goal - 140/78. Patient has had no complaints of any cardiac type chest pain, palpitations, dyspnea / orthopnea / PND, dizziness, claudication, or dependent edema.     Hyperlipidemia is not controlled with diet & meds. Patient denies myalgias or other med SE's. Last Lipids were not at goal: Lab Results  Component Value Date   CHOL 162 09/14/2017   HDL 39 (L) 09/14/2017   LDLCALC 117 (H) 06/06/2017   TRIG 179 (H) 09/14/2017   CHOLHDL 4.2 09/14/2017      Also, the patient has history of PreDiabetes (A1c 5.9%/2009 and 6.3%/2014) and has had no symptoms of reactive hypoglycemia, diabetic polys, paresthesias or visual blurring.  Last A1c was Normal & at goal: Lab Results  Component Value Date   HGBA1C 5.9 (H) 09/14/2017      Patient was dx'd Hypothyroid in 2009 and has since been on replacement. Further, the patient also has history of Vitamin D Deficiency ("9"/2009) and supplements vitamin D without any suspected side-effects. Last vitamin D was at goal: Lab Results  Component Value Date   VD25OH 73 06/06/2017   Current Outpatient Medications on File Prior to Visit  Medication Sig  . ADVAIR HFA 115-21 MCG/ACT inhaler INHALE 2 PUFFS BY MOUTH EVERY 12 HOURS 10 TO 15 MINUTES APART  . amitriptyline (ELAVIL) 25 MG tablet Take 1 tablet (25 mg total) by mouth at bedtime.  Marland Kitchen atorvastatin (LIPITOR) 10 MG tablet TAKE 1 TABLET BY MOUTH EVERY DAY  . Cholecalciferol (VITAMIN D PO)  Take 4,000 Units by mouth daily.   Marland Kitchen CINNAMON PO Take 1,000 Units by mouth 2 (two) times daily.   . clonazePAM (KLONOPIN) 0.5 MG tablet TAKE 1/2 (ONE-HALF) TO 1 TABLET BY MOUTH UP TO  TWICE DAILY ONLY IF NEEDED FOR AN ANXIETY ATTACK OR SLEEP  . Cyanocobalamin (VITAMIN B-12) 2500 MCG SUBL Place 1 tablet under the tongue daily.   . diclofenac sodium (VOLTAREN) 1 % GEL Apply 4 g topically 4 (four) times daily.  . DULoxetine (CYMBALTA) 60 MG capsule Take 1 capsule (60 mg total) by mouth daily.  . enalapril (VASOTEC) 20 MG tablet TAKE 1 TABLET BY MOUTH DAILY  . fexofenadine-pseudoephedrine (ALLEGRA-D 12 HOUR) 60-120 MG per tablet Take 1 tablet by mouth daily.  . fluocinonide cream (LIDEX) 0.05 % Apply to rash 2 x / day as needed  . fluticasone (FLONASE) 50 MCG/ACT nasal spray SHAKE LIQUID AND USE 2 SPRAYS IN EACH NOSTRIL EVERY EVENING  . gabapentin (NEURONTIN) 100 MG capsule Take 100 mg by mouth 3 (three) times daily.  Marland Kitchen lamoTRIgine (LAMICTAL) 25 MG tablet Take 1 tab daily  . levothyroxine (SYNTHROID, LEVOTHROID) 50 MCG tablet TAKE 1 TABLET(50 MCG) BY MOUTH DAILY  . Magnesium 250 MG TABS Take 2 tablets by mouth daily.   . meloxicam (MOBIC) 15 MG tablet TAKE 1 TABLET(15 MG) BY MOUTH DAILY  . montelukast (SINGULAIR) 10 MG tablet TAKE  1 TABLET(10 MG) BY MOUTH AT BEDTIME  . nystatin (MYCOSTATIN) 100000 UNIT/ML suspension Take 5 mLs (500,000 Units total) by mouth 4 (four) times daily.  . pantoprazole (PROTONIX) 40 MG tablet Take 1 tablet (40 mg total) by mouth 2 (two) times daily.  . promethazine (PHENERGAN) 25 MG tablet Take 1 tablet (25 mg total) by mouth every 4 (four) hours as needed for nausea or vomiting.   No current facility-administered medications on file prior to visit.    Allergies  Allergen Reactions  . Demerol [Meperidine] Other (See Comments)    REACTION: unknown--itching  . Epinephrine Other (See Comments)    "Sensitivity."  . Niacin And Related Rash   PMHx:   Past Medical  History:  Diagnosis Date  . Asthma    Dulera daily  . Chronic back pain    HNP  . Complication of anesthesia    slow to wake up  . Depression    takes Clonazepam nightly  . GERD (gastroesophageal reflux disease)    takes Protonix daily   . Heart murmur   . History of migraine    takes Relpax daily as needed  . HSV (herpes simplex virus) infection   . Hyperlipidemia    takes Atorvastatin daily  . Hypertension    takes Benicar daily  . Hypothyroidism    takes Synthroid daily  . MVP (mitral valve prolapse)   . Pneumonia    hx of--been many yrs ago  . Seasonal allergies    takes Allegra daily  . Shortness of breath    with exertion   Immunization History  Administered Date(s) Administered  . Influenza Inj Mdck Quad With Preservative 09/14/2017  . Influenza,inj,quad, With Preservative 11/09/2016  . Influenza-Unspecified 09/18/2013  . PPD Test 04/17/2014  . Pneumococcal-Unspecified 11/30/1995  . Td 08/27/2005, 05/05/2016   Past Surgical History:  Procedure Laterality Date  . ABLATION  2011  . buttocks surgery     at age 85  . CERVICAL FUSION  03/2010  . CESAREAN SECTION  1992  . CHOLECYSTECTOMY  2008  . COLONOSCOPY    . DILATION AND CURETTAGE OF UTERUS  2011  . ESOPHAGOGASTRODUODENOSCOPY    . LUMBAR LAMINECTOMY/DECOMPRESSION MICRODISCECTOMY  10/24/2012   Procedure: LUMBAR LAMINECTOMY/DECOMPRESSION MICRODISCECTOMY 1 LEVEL;  Surgeon: Kristeen Miss, MD;  Location: [facility];  Service: Neurosurgery;  Laterality: Right;  Right Lumbar five-Sacral One Microdiskectomy  . right knee arthroscopy    . TONSILLECTOMY     at age 34   FHx:    Reviewed / unchanged  SHx:    Reviewed / unchanged  Systems Review:  Constitutional: Denies fever, chills, wt changes, headaches, insomnia, fatigue, night sweats, change in appetite. Eyes: Denies redness, blurred vision, diplopia, discharge, itchy, watery eyes.  ENT: Denies discharge, congestion, post nasal drip, epistaxis, sore throat,  earache, hearing loss, dental pain, tinnitus, vertigo, sinus pain, snoring.  CV: Denies chest pain, palpitations, irregular heartbeat, syncope, dyspnea, diaphoresis, orthopnea, PND, claudication or edema. Respiratory: denies cough, dyspnea, DOE, pleurisy, hoarseness, laryngitis, wheezing.  Gastrointestinal: Denies dysphagia, odynophagia, heartburn, reflux, water brash, abdominal pain or cramps, nausea, vomiting, bloating, diarrhea, constipation, hematemesis, melena, hematochezia  or hemorrhoids. Genitourinary: Denies dysuria, frequency, urgency, nocturia, hesitancy, discharge, hematuria or flank pain. Musculoskeletal: Denies arthralgias, myalgias, stiffness, jt. swelling, pain, limping or strain/sprain.  Skin: Denies pruritus, rash, hives, warts, acne, eczema or change in skin lesion(s). Neuro: No weakness, tremor, incoordination, spasms, paresthesia or pain. Psychiatric: Denies confusion, memory loss or sensory loss. Endo: Denies change in  weight, skin or hair change.  Heme/Lymph: No excessive bleeding, bruising or enlarged lymph nodes.  Physical Exam  BP 140/78   Pulse 80   Temp 97.6 F (36.4 C)   Resp 16   Ht 5\' 1"  (1.549 m)   Wt 119 lb 9.6 oz (54.3 kg)   BMI 22.60 kg/m   Appears well nourished, well groomed  and in no distress.  Eyes: PERRLA, EOMs, conjunctiva no swelling or erythema. Sinuses: No frontal/maxillary tenderness ENT/Mouth: EAC's clear, TM's nl w/o erythema, bulging. Nares clear w/o erythema, swelling, exudates. Oropharynx clear without erythema or exudates. Oral hygiene is good. Tongue normal, non obstructing. Hearing intact.  Neck: Supple. Thyroid nl. Car 2+/2+ without bruits, nodes or JVD. Chest: Respirations nl with BS clear & equal w/o rales, rhonchi, wheezing or stridor.  Cor: Heart sounds normal w/ regular rate and rhythm without sig. murmurs, gallops, clicks or rubs. Peripheral pulses normal and equal  without edema.  Abdomen: Soft & bowel sounds normal.  Non-tender w/o guarding, rebound, hernias, masses or organomegaly.  Lymphatics: Unremarkable.  Musculoskeletal: Full ROM all peripheral extremities, joint stability, 5/5 strength and normal gait.  Skin: Warm, dry without exposed rashes, lesions or ecchymosis apparent.  Neuro: Cranial nerves intact, reflexes equal bilaterally. Sensory-motor testing grossly intact. Tendon reflexes grossly intact.  Pysch: Alert & oriented x 3.  Insight and judgement nl & appropriate. No ideations.  Assessment and Plan:  1. Essential hypertension  - Continue medication, monitor blood pressure at home.  - Continue DASH diet. Reminder to go to the ER if any CP,  SOB, nausea, dizziness, severe HA, changes vision/speech.  - CBC with Differential/Platelet - BASIC METABOLIC PANEL WITH GFR - Magnesium - TSH  2. Hyperlipidemia, mixed  - Continue diet/meds, exercise,& lifestyle modifications.  - Continue monitor periodic cholesterol/liver & renal functions  - Hepatic function panel - Lipid panel - TSH  3. Prediabetes  - Continue diet, exercise, lifestyle modifications.  - Monitor appropriate labs.  - Hemoglobin A1c - Insulin, random  4. Vitamin D deficiency  - Continue supplementation.  - VITAMIN D 25 Hydroxy  5. Hypothyroidism  - TSH  6. Abnormal glucose  - Hemoglobin A1c - Insulin, random  7. Chronic back pain   8. Gastroesophageal reflux disease  - CBC with Differential/Platelet  9. Medication management  - CBC with Differential/Platelet - BASIC METABOLIC PANEL WITH GFR - Hepatic function panel - Magnesium - Lipid panel - TSH - Hemoglobin A1c - Insulin, random - VITAMIN D 25 Hydroxy          Discussed  regular exercise, BP monitoring, weight control to achieve/maintain BMI less than 25 and discussed med and SE's. Recommended labs to assess and monitor clinical status with further disposition pending results of labs. Over 30 minutes of exam, counseling, chart review was  performed.

## 2017-12-22 ENCOUNTER — Encounter (HOSPITAL_COMMUNITY): Payer: Self-pay | Admitting: Psychiatry

## 2017-12-22 ENCOUNTER — Ambulatory Visit (HOSPITAL_COMMUNITY): Payer: PPO | Admitting: Psychiatry

## 2017-12-22 DIAGNOSIS — F419 Anxiety disorder, unspecified: Secondary | ICD-10-CM | POA: Diagnosis not present

## 2017-12-22 DIAGNOSIS — Z87891 Personal history of nicotine dependence: Secondary | ICD-10-CM | POA: Diagnosis not present

## 2017-12-22 DIAGNOSIS — Z818 Family history of other mental and behavioral disorders: Secondary | ICD-10-CM

## 2017-12-22 DIAGNOSIS — F331 Major depressive disorder, recurrent, moderate: Secondary | ICD-10-CM

## 2017-12-22 LAB — CBC WITH DIFFERENTIAL/PLATELET
Basophils Absolute: 40 cells/uL (ref 0–200)
Basophils Relative: 0.6 %
Eosinophils Absolute: 271 cells/uL (ref 15–500)
Eosinophils Relative: 4.1 %
HCT: 36.1 % (ref 35.0–45.0)
Hemoglobin: 12 g/dL (ref 11.7–15.5)
Lymphs Abs: 1888 cells/uL (ref 850–3900)
MCH: 28.7 pg (ref 27.0–33.0)
MCHC: 33.2 g/dL (ref 32.0–36.0)
MCV: 86.4 fL (ref 80.0–100.0)
MPV: 9.9 fL (ref 7.5–12.5)
Monocytes Relative: 7.1 %
Neutro Abs: 3934 cells/uL (ref 1500–7800)
Neutrophils Relative %: 59.6 %
Platelets: 309 10*3/uL (ref 140–400)
RBC: 4.18 10*6/uL (ref 3.80–5.10)
RDW: 13.1 % (ref 11.0–15.0)
Total Lymphocyte: 28.6 %
WBC mixed population: 469 cells/uL (ref 200–950)
WBC: 6.6 10*3/uL (ref 3.8–10.8)

## 2017-12-22 LAB — BASIC METABOLIC PANEL WITH GFR
BUN: 16 mg/dL (ref 7–25)
CO2: 31 mmol/L (ref 20–32)
Calcium: 10 mg/dL (ref 8.6–10.4)
Chloride: 103 mmol/L (ref 98–110)
Creat: 0.87 mg/dL (ref 0.50–0.99)
GFR, Est African American: 84 mL/min/{1.73_m2} (ref >=60)
GFR, Est Non African American: 72 mL/min/{1.73_m2} (ref >=60)
Glucose, Bld: 104 mg/dL — ABNORMAL HIGH (ref 65–99)
Potassium: 4.3 mmol/L (ref 3.5–5.3)
Sodium: 143 mmol/L (ref 135–146)

## 2017-12-22 LAB — HEPATIC FUNCTION PANEL
AG Ratio: 1.8 (calc) (ref 1.0–2.5)
ALT: 15 U/L (ref 6–29)
AST: 20 U/L (ref 10–35)
Albumin: 4.6 g/dL (ref 3.6–5.1)
Alkaline phosphatase (APISO): 67 U/L (ref 33–130)
Bilirubin, Direct: 0.1 mg/dL (ref 0.0–0.2)
Globulin: 2.6 g/dL (calc) (ref 1.9–3.7)
Indirect Bilirubin: 0.3 mg/dL (calc) (ref 0.2–1.2)
Total Bilirubin: 0.4 mg/dL (ref 0.2–1.2)
Total Protein: 7.2 g/dL (ref 6.1–8.1)

## 2017-12-22 LAB — LIPID PANEL
Cholesterol: 185 mg/dL (ref <200)
HDL: 48 mg/dL — ABNORMAL LOW (ref >50)
LDL Cholesterol (Calc): 111 mg/dL (calc) — ABNORMAL HIGH
Non-HDL Cholesterol (Calc): 137 mg/dL (calc) — ABNORMAL HIGH (ref <130)
Total CHOL/HDL Ratio: 3.9 (calc) (ref <5.0)
Triglycerides: 147 mg/dL (ref <150)

## 2017-12-22 LAB — HEMOGLOBIN A1C
Hgb A1c MFr Bld: 6 % of total Hgb — ABNORMAL HIGH (ref <5.7)
Mean Plasma Glucose: 126 (calc)
eAG (mmol/L): 7 (calc)

## 2017-12-22 LAB — VITAMIN D 25 HYDROXY (VIT D DEFICIENCY, FRACTURES): Vit D, 25-Hydroxy: 60 ng/mL (ref 30–100)

## 2017-12-22 LAB — MAGNESIUM: Magnesium: 2.1 mg/dL (ref 1.5–2.5)

## 2017-12-22 LAB — INSULIN, RANDOM: Insulin: 12.4 u[IU]/mL (ref 2.0–19.6)

## 2017-12-22 LAB — TSH: TSH: 1.21 mIU/L (ref 0.40–4.50)

## 2017-12-22 MED ORDER — AMITRIPTYLINE HCL 25 MG PO TABS: 25.0000 mg | ORAL_TABLET | Freq: Every day | ORAL | 1 refills | Status: DC

## 2017-12-22 MED ORDER — LAMOTRIGINE 25 MG PO TABS: ORAL_TABLET | ORAL | 1 refills | Status: DC

## 2017-12-22 NOTE — Progress Notes (Signed)
[place] MD/PA/NP OP Progress Note  12/22/2017 11:09 AM Sarah Rodriguez  MRN:  [id number]  Chief Complaint: I am doing fine.  I am taking medication and they are working.  I stopped seeing Beth because I feel I do not need it.  HPI: Sarah Rodriguez came for her follow-up appointment.  She had a good Christmas.  She is taking Lamictal 25 mg and amitriptyline 25 mg at bedtime.  She feels current competent of medication is working very well.  She is also taking Cymbalta and Klonopin prescribed by her primary care physician.  She denies any crying spells, irritability, hopelessness or any suicidal thoughts.  She admitted sometimes anxiety and nervousness but she feels medicine working.  She is no longer taking [employer] because she feels she does not needed.  She has chronic pain and sometimes tingling and joint pain.  She takes gabapentin.  She lives with her husband who is very supportive.  Patient denies any rash, itching, tremors or shakes.  Her energy level is good.  Her appetite is okay.  Patient denies any mania or psychosis.  She wants to continue current psychiatric medication.  She has no concerns.  She denies drinking alcohol or using any illegal substances.  Patient like to follow-up in 6 months because of increased co-pay cost.  Visit Diagnosis:    ICD-10-CM   1. MDD (major depressive disorder), recurrent episode, moderate (HCC) F33.1 lamoTRIgine (LAMICTAL) 25 MG tablet    amitriptyline (ELAVIL) 25 MG tablet    Past Psychiatric History: Reviewed. Patient reported history of depression and has been taking antidepressants since 1996. She had a history of panic attacks while driving. She was seeing Dr. Letta Rodriguez and tried Prozac, Paxil, Zoloft, Xanax, Cymbalta and Klonopin. Patient denies any history of psychiatric inpatient treatment or any suicidal attempt.  Past Medical History:  Past Medical History:  Diagnosis Date  . Asthma    Dulera daily  . Chronic back pain    HNP  . Complication  of anesthesia    slow to wake up  . Depression    takes Clonazepam nightly  . GERD (gastroesophageal reflux disease)    takes Protonix daily   . Heart murmur   . History of migraine    takes Relpax daily as needed  . HSV (herpes simplex virus) infection   . Hyperlipidemia    takes Atorvastatin daily  . Hypertension    takes Benicar daily  . Hypothyroidism    takes Synthroid daily  . MVP (mitral valve prolapse)   . Pneumonia    hx of--been many yrs ago  . Seasonal allergies    takes Allegra daily  . Shortness of breath    with exertion    Past Surgical History:  Procedure Laterality Date  . ABLATION  2011  . buttocks surgery     at age 43  . CERVICAL FUSION  03/2010  . CESAREAN SECTION  1992  . CHOLECYSTECTOMY  2008  . COLONOSCOPY    . DILATION AND CURETTAGE OF UTERUS  2011  . ESOPHAGOGASTRODUODENOSCOPY    . LUMBAR LAMINECTOMY/DECOMPRESSION MICRODISCECTOMY  10/24/2012   Procedure: LUMBAR LAMINECTOMY/DECOMPRESSION MICRODISCECTOMY 1 LEVEL;  Surgeon: Sarah Miss, MD;  Location: [facility];  Service: Neurosurgery;  Laterality: Right;  Right Lumbar five-Sacral One Microdiskectomy  . right knee arthroscopy    . TONSILLECTOMY     at age 61    Family Psychiatric History: Reviewed.  Family History:  Family History  Problem Relation Age of Onset  .  Arthritis Mother   . Heart attack Mother   . Depression Mother   . Allergies Mother   . Hypertension Mother   . Heart attack Father     Social History:  Social History   Socioeconomic History  . Marital status: Married    Spouse name: Not on file  . Number of children: Not on file  . Years of education: Not on file  . Highest education level: Not on file  Social Needs  . Financial resource strain: Not on file  . Food insecurity - worry: Not on file  . Food insecurity - inability: Not on file  . Transportation needs - medical: Not on file  . Transportation needs - non-medical: Not on file  Occupational History  .  Not on file  Tobacco Use  . Smoking status: Former Research scientist (life sciences)  . Smokeless tobacco: Never Used  . Tobacco comment: quit at age 81  Substance and Sexual Activity  . Alcohol use: No    Comment: 2-3 times a yr  . Drug use: No  . Sexual activity: Yes    Birth control/protection: Post-menopausal  Other Topics Concern  . Not on file  Social History Narrative  . Not on file    Allergies:  Allergies  Allergen Reactions  . Demerol [Meperidine] Other (See Comments)    REACTION: unknown--itching  . Epinephrine Other (See Comments)    "Sensitivity."  . Niacin And Related Rash    Metabolic Disorder Labs: Lab Results  Component Value Date   HGBA1C 6.0 (H) 12/21/2017   MPG 126 12/21/2017   MPG 123 09/14/2017   No results found for: PROLACTIN Lab Results  Component Value Date   CHOL 185 12/21/2017   TRIG 147 12/21/2017   HDL 48 (L) 12/21/2017   CHOLHDL 3.9 12/21/2017   VLDL 31 (H) 06/06/2017   LDLCALC 117 (H) 06/06/2017   LDLCALC 92 02/07/2017   Lab Results  Component Value Date   TSH 1.21 12/21/2017   TSH 0.93 09/14/2017    Therapeutic Level Labs: No results found for: LITHIUM No results found for: VALPROATE No components found for:  CBMZ  Current Medications: Current Outpatient Medications  Medication Sig Dispense Refill  . ADVAIR HFA 115-21 MCG/ACT inhaler INHALE 2 PUFFS BY MOUTH EVERY 12 HOURS 10 TO 15 MINUTES APART 3 Inhaler 3  . amitriptyline (ELAVIL) 25 MG tablet Take 1 tablet (25 mg total) by mouth at bedtime. 90 tablet 0  . atorvastatin (LIPITOR) 10 MG tablet TAKE 1 TABLET BY MOUTH EVERY DAY 90 tablet 0  . Cholecalciferol (VITAMIN D PO) Take 4,000 Units by mouth daily.     Marland Kitchen CINNAMON PO Take 1,000 Units by mouth 2 (two) times daily.     . clonazePAM (KLONOPIN) 0.5 MG tablet TAKE 1/2 (ONE-HALF) TO 1 TABLET BY MOUTH UP TO  TWICE DAILY ONLY IF NEEDED FOR AN ANXIETY ATTACK OR SLEEP 60 tablet 0  . Cyanocobalamin (VITAMIN B-12) 2500 MCG SUBL Place 1 tablet under the  tongue daily.     . diclofenac sodium (VOLTAREN) 1 % GEL Apply 4 g topically 4 (four) times daily. 100 g 3  . DULoxetine (CYMBALTA) 60 MG capsule Take 1 capsule (60 mg total) by mouth daily. 180 capsule 0  . enalapril (VASOTEC) 20 MG tablet TAKE 1 TABLET BY MOUTH DAILY 90 tablet 1  . fexofenadine-pseudoephedrine (ALLEGRA-D 12 HOUR) 60-120 MG per tablet Take 1 tablet by mouth daily.    . fluocinonide cream (LIDEX) 0.05 % Apply to  rash 2 x / day as needed 60 g 3  . fluticasone (FLONASE) 50 MCG/ACT nasal spray SHAKE LIQUID AND USE 2 SPRAYS IN EACH NOSTRIL EVERY EVENING 16 g 2  . fluticasone (FLONASE) 50 MCG/ACT nasal spray SHAKE WELL AND USE 2 SPRAYS IN EACH NOSTRIL EVERY EVENING 48 g 2  . gabapentin (NEURONTIN) 100 MG capsule Take 100 mg by mouth 3 (three) times daily.    Marland Kitchen lamoTRIgine (LAMICTAL) 25 MG tablet Take 1 tab daily 90 tablet 0  . levothyroxine (SYNTHROID, LEVOTHROID) 50 MCG tablet TAKE 1 TABLET(50 MCG) BY MOUTH DAILY 90 tablet 1  . Magnesium 250 MG TABS Take 2 tablets by mouth daily.     . meloxicam (MOBIC) 15 MG tablet TAKE 1 TABLET(15 MG) BY MOUTH DAILY 90 tablet 3  . montelukast (SINGULAIR) 10 MG tablet TAKE 1 TABLET(10 MG) BY MOUTH AT BEDTIME 90 tablet 3  . nystatin (MYCOSTATIN) 100000 UNIT/ML suspension Take 5 mLs (500,000 Units total) by mouth 4 (four) times daily. 473 mL 1  . pantoprazole (PROTONIX) 40 MG tablet Take 1 tablet (40 mg total) by mouth 2 (two) times daily. 180 tablet 1  . promethazine (PHENERGAN) 25 MG tablet Take 1 tablet (25 mg total) by mouth every 4 (four) hours as needed for nausea or vomiting. 100 tablet 0   No current facility-administered medications for this visit.      Musculoskeletal: Strength & Muscle Tone: within normal limits Gait & Station: normal Patient leans: N/A  Psychiatric Specialty Exam: ROS  Blood pressure 124/80, pulse 73, height 5' 0.5" (1.537 m), weight 119 lb (54 kg), SpO2 99 %.Body mass index is 22.86 kg/m.  General Appearance:  Casual  Eye Contact:  Good  Speech:  Clear and Coherent  Volume:  Normal  Mood:  Anxious  Affect:  Appropriate  Thought Process:  Goal Directed  Orientation:  Full (Time, Place, and Person)  Thought Content: Logical   Suicidal Thoughts:  No  Homicidal Thoughts:  No  Memory:  Immediate;   Good Recent;   Good Remote;   Good  Judgement:  Good  Insight:  Good  Psychomotor Activity:  Normal  Concentration:  Concentration: Good and Attention Span: Good  Recall:  Good  Fund of Knowledge: Good  Language: Good  Akathisia:  No  Handed:  Right  AIMS (if indicated): not done  Assets:  Communication Skills Desire for Improvement Intimacy Resilience Social Support  ADL's:  Intact  Cognition: WNL  Sleep:  Good   Screenings: PHQ2-9     Office Visit from 12/21/2017 in [facility] [place] Office Visit from 09/14/2017 in [facility] [place] Office Visit from 06/06/2017 in [facility] [place] [facility] Visit from 02/07/2017 in [facility] [place] Office Visit from 11/09/2016 in [facility]  PHQ-2 Total Score  0  0  0  0  0       Assessment and Plan: Major depressive disorder, recurrent.  Anxiety disorder NOS.  I reviewed records from her primary care physician and recent blood work results.  Her hemoglobin A1c is 6.  She wants to stay on Lamictal 25 mg because she feels it is working very well.  I will continue amitriptyline 25 mg at bedtime, Lamictal 25 mg daily.  Patient is getting Cymbalta and Klonopin from her primary care physician.  Discussed medication side effects and benefits.  Patient like to follow-up in 6 months due to increased co-pay cost.  She  is not seeing therapist however agreed that if symptoms started to get worse then she will call us sooner.  Follow-up in 6 months.  Kathlee Nations, MD 12/22/2017, 11:09 AM

## 2018-01-06 ENCOUNTER — Other Ambulatory Visit: Payer: Self-pay | Admitting: Internal Medicine

## 2018-01-24 ENCOUNTER — Other Ambulatory Visit: Payer: Self-pay | Admitting: Physician Assistant

## 2018-01-24 MED ORDER — ATORVASTATIN CALCIUM 10 MG PO TABS: 10.0000 mg | ORAL_TABLET | Freq: Every day | ORAL | 0 refills | Status: DC

## 2018-01-24 NOTE — Progress Notes (Signed)
Future Appointments  Date Time Provider [place]  03/30/2018 11:15 AM Liane Comber, NP [facility] None  06/21/2018 10:45 AM Arfeen, Arlyce Harman, MD [facility] None  07/03/2018  2:00 PM Unk Pinto, MD [facility] None

## 2018-02-13 ENCOUNTER — Other Ambulatory Visit: Payer: Self-pay | Admitting: Internal Medicine

## 2018-03-01 DIAGNOSIS — K219 Gastro-esophageal reflux disease without esophagitis: Secondary | ICD-10-CM | POA: Diagnosis not present

## 2018-03-14 DIAGNOSIS — R490 Dysphonia: Secondary | ICD-10-CM | POA: Insufficient documentation

## 2018-03-14 DIAGNOSIS — J342 Deviated nasal septum: Secondary | ICD-10-CM | POA: Diagnosis not present

## 2018-03-14 DIAGNOSIS — K219 Gastro-esophageal reflux disease without esophagitis: Secondary | ICD-10-CM | POA: Diagnosis not present

## 2018-03-14 DIAGNOSIS — R198 Other specified symptoms and signs involving the digestive system and abdomen: Secondary | ICD-10-CM | POA: Insufficient documentation

## 2018-03-14 DIAGNOSIS — Z7289 Other problems related to lifestyle: Secondary | ICD-10-CM | POA: Diagnosis not present

## 2018-03-14 DIAGNOSIS — R053 Chronic cough: Secondary | ICD-10-CM | POA: Insufficient documentation

## 2018-03-14 DIAGNOSIS — Z87891 Personal history of nicotine dependence: Secondary | ICD-10-CM | POA: Diagnosis not present

## 2018-03-23 DIAGNOSIS — M5416 Radiculopathy, lumbar region: Secondary | ICD-10-CM | POA: Diagnosis not present

## 2018-03-29 NOTE — Progress Notes (Signed)
MEDICARE ANNUAL WELLNESS VISIT AND FOLLOW UP  Assessment:   Diagnoses and all orders for this visit:  Encounter for Medicare annual wellness exam  Migraine with aura and without status migrainosus, not intractable Well managed by PRN OTC analgesic and phenergan Recently improved after menopause  Essential hypertension Continue medication Monitor blood pressure at home; call if consistently over 130/80 Continue DASH diet.   Reminder to go to the ER if any CP, SOB, nausea, dizziness, severe HA, changes vision/speech, left arm numbness and tingling and jaw pain.  Uncomplicated asthma, unspecified asthma severity, unspecified whether persistent Well controlled on daily inhalers without recent flair  Gastroesophageal reflux disease, esophagitis presence not specified Well managed on current medications Discussed diet, avoiding triggers and other lifestyle changes  Hypothyroidism, unspecified typ continue medications the same pending lab results reminded to take on an empty stomach 30-61mins before food.  check TSH level  Spondylolisthesis at L4-L5 level S/p fusion for repair Followed by Dr. Osborne Oman  Primary osteoarthritis involving multiple joints Well managed by motrin/tylenol  Vitamin D deficiency At goal at recent check; continue to recommend supplementation Defer vitamin D level  Prediabetes Discussed disease and risks Discussed diet/exercise, weight management  A1C,  Medication management CBC, CMP/GFR  Hyperlipidemia, mixed Continue medications: atorvastatin Continue low cholesterol diet and exercise.  Check lipid panel.   Depression, major, recurrent, in partial remission ([place]) Followed by Dr. Adele Schilder Continue medications, reminded to limit benzo use to <5 days a week if possible to avoid dependence  Lifestyle discussed: diet/exerise, sleep hygiene, stress management, hydration  Chronic back pain, unspecified back location, unspecified back pain  laterality Continue follow up with Dr. Ellene Route Takes gabapentin for back pain  BMI 22.0-22.9, adult Continue to recommend diet heavy in fruits and veggies and low in animal meats, cheeses, and dairy products, appropriate calorie intake Discuss exercise recommendations routinely Continue to monitor weight at each visit    Over 40 minutes of exam, counseling, chart review and critical decision making was performed Future Appointments  Date Time Provider [place]  06/13/2018  2:15 PM Arfeen, Arlyce Harman, MD [facility] None  07/03/2018  2:00 PM Unk Pinto, MD [facility] None     Plan:   During the course of the visit the patient was educated and counseled about appropriate screening and preventive services including:    Pneumococcal vaccine   Prevnar 13  Influenza vaccine  Td vaccine  Screening electrocardiogram  Bone densitometry screening  Colorectal cancer screening  Diabetes screening  Glaucoma screening  Nutrition counseling   Advanced directives: requested   Subjective:  Sarah Rodriguez is a 62 y.o. female who presents for Medicare Annual Wellness Visit and 3 month follow up. Patient has GERD controlled on her meds. She also has Chronic Pain Syndrome consequent of Cx DDD and Cx HNP surg in 2011 and then in 2013, she underwent L5/S1 Surg by Dr Ellene Route and lastly in Nov 2016, she had a Lumbar fusion. She is able to control her pain on Gabapentin and has avoided Opioids.    she has a diagnosis of depression currently in remission on lamictal and amitryptiline, and currently takes klonopin 0.25-0.5 mg BID PRN anxiety and insomnia, reports symptoms are well controlled on current regimen. she takes 0.125 mg of klonopin at night for sleep, uses rarely during the day for anxiety.   BMI is Body mass index is 22.48 kg/m., she has been working on diet and exercise. Wt Readings from Last 3 Encounters:  03/30/18 119 lb (54 kg)  12/21/17 119 lb 9.6 oz (54.3 kg)   09/14/17 120 lb 12.8 oz (54.8 kg)   . Her blood pressure has been controlled at home, today their BP is BP: 120/78 She does workout. She denies chest pain, shortness of breath, dizziness.   She is on cholesterol medication (atorvastatin 10 mg daily) and denies myalgias. Her cholesterol is not at goal. The cholesterol last visit was:   Lab Results  Component Value Date   CHOL 185 12/21/2017   HDL 48 (L) 12/21/2017   LDLCALC 111 (H) 12/21/2017   TRIG 147 12/21/2017   CHOLHDL 3.9 12/21/2017    She has been working on diet and exercise for prediabetes, and denies foot ulcerations, increased appetite, nausea, polydipsia, polyuria, visual disturbances, vomiting and weight loss. Last A1C in the office was:  Lab Results  Component Value Date   HGBA1C 6.0 (H) 12/21/2017   She is on thyroid medication. Her medication was not changed last visit.   Lab Results  Component Value Date   TSH 1.21 12/21/2017   Last GFR: Lab Results  Component Value Date   GFRNONAA 72 12/21/2017   Patient is on Vitamin D supplement.   Lab Results  Component Value Date   VD25OH 60 12/21/2017      Medication Review: Current Outpatient Medications on File Prior to Visit  Medication Sig Dispense Refill  . ADVAIR HFA 115-21 MCG/ACT inhaler INHALE 2 PUFFS BY MOUTH EVERY 12 HOURS 10 TO 15 MINUTES APART 3 Inhaler 3  . amitriptyline (ELAVIL) 25 MG tablet Take 1 tablet (25 mg total) by mouth at bedtime. 90 tablet 1  . atorvastatin (LIPITOR) 10 MG tablet Take 1 tablet (10 mg total) by mouth daily. 90 tablet 0  . Cholecalciferol (VITAMIN D PO) Take 4,000 Units by mouth daily.     Marland Kitchen CINNAMON PO Take 1,000 Units by mouth 2 (two) times daily.     . clonazePAM (KLONOPIN) 0.5 MG tablet Take 1/2 to 1 tablet 2 x / day Only if needed for Anxiety Attack or sleep & please try to limit to 5 days /week to avoid addiction 60 tablet 0  . Cyanocobalamin (VITAMIN B-12) 2500 MCG SUBL Place 1 tablet under the tongue daily.     .  diclofenac sodium (VOLTAREN) 1 % GEL Apply 4 g topically 4 (four) times daily. 100 g 3  . DULoxetine (CYMBALTA) 60 MG capsule Take 1 capsule (60 mg total) by mouth daily. 180 capsule 0  . enalapril (VASOTEC) 20 MG tablet TAKE 1 TABLET BY MOUTH DAILY 90 tablet 1  . fexofenadine-pseudoephedrine (ALLEGRA-D 12 HOUR) 60-120 MG per tablet Take 1 tablet by mouth daily.    . fluocinonide cream (LIDEX) 0.05 % Apply to rash 2 x / day as needed 60 g 3  . fluticasone (FLONASE) 50 MCG/ACT nasal spray SHAKE LIQUID AND USE 2 SPRAYS IN EACH NOSTRIL EVERY EVENING 16 g 2  . fluticasone (FLONASE) 50 MCG/ACT nasal spray SHAKE WELL AND USE 2 SPRAYS IN EACH NOSTRIL EVERY EVENING 48 g 2  . gabapentin (NEURONTIN) 100 MG capsule Take 100 mg by mouth 3 (three) times daily.    . halobetasol (ULTRAVATE) 0.05 % ointment   3  . lamoTRIgine (LAMICTAL) 25 MG tablet Take 1 tab daily 90 tablet 1  . levothyroxine (SYNTHROID, LEVOTHROID) 50 MCG tablet TAKE 1 TABLET(50 MCG) BY MOUTH DAILY 90 tablet 1  . Magnesium 250 MG TABS Take 3 tablets by mouth daily.     . meloxicam (MOBIC) 15  MG tablet TAKE 1 TABLET(15 MG) BY MOUTH DAILY 90 tablet 3  . montelukast (SINGULAIR) 10 MG tablet TAKE 1 TABLET(10 MG) BY MOUTH AT BEDTIME 90 tablet 3  . nystatin (MYCOSTATIN) 100000 UNIT/ML suspension Take 5 mLs (500,000 Units total) by mouth 4 (four) times daily. 473 mL 1  . pantoprazole (PROTONIX) 40 MG tablet TAKE 1 TABLET(40 MG) BY MOUTH TWICE DAILY 180 tablet 1  . PREMARIN vaginal cream INSERT 1/2 GRAM VAGINALLY VIA APPLICATOR NIGHTLY FOR W EEKS THEN JUST DO TEICE A WEEK  11  . promethazine (PHENERGAN) 25 MG tablet Take 1 tablet (25 mg total) by mouth every 4 (four) hours as needed for nausea or vomiting. 100 tablet 0   No current facility-administered medications on file prior to visit.     Allergies  Allergen Reactions  . Demerol [Meperidine] Other (See Comments)    REACTION: unknown--itching  . Epinephrine Other (See Comments)     "Sensitivity."  . Niacin And Related Rash    Current Problems (verified) Patient Active Problem List   Diagnosis Date Noted  . Chronic back pain 12/21/2017  . Depression, major, recurrent, in partial remission ([place]) 08/05/2016  . Degenerative joint disease 08/05/2016  . Migraine 03/03/2016  . Encounter for Medicare annual wellness exam 02/16/2016  . Spondylolisthesis at L4-L5 level 10/27/2015  . Prediabetes 12/12/2014  . Vitamin D deficiency 04/17/2014  . Medication management 04/17/2014  . Hyperlipidemia, mixed   . Hypertension   . Asthma   . GERD (gastroesophageal reflux disease)   . Hypothyroidism     Screening Tests Immunization History  Administered Date(s) Administered  . Influenza Inj Mdck Quad With Preservative 09/14/2017  . Influenza,inj,quad, With Preservative 11/09/2016  . Influenza-Unspecified 09/18/2013  . PPD Test 04/17/2014  . Pneumococcal-Unspecified 11/30/1995  . Td 08/27/2005, 05/05/2016   Preventative care: Last colonoscopy: 2014 Dr. Cristina Gong  normal Last mammogram: 10/04/2017 at Dr. Valentino Saxon Last pap smear/pelvic exam: 2017 at [place]: declines Stress test 2006 EGD 2014, Dr. Cristina Gong  Prior vaccinations: TD or Tdap: 2017 Influenza: 2018  Pneumococcal: 1997 Prevnar13: age 89 Shingles/Zostavax: declines  Names of Other Physician/Practitioners you currently use: 1.  [facility]- here for primary care 2. Dr. Georgina Snell, eye doctor, last visit 2018, wears glasses.  3. Dr. Carlye Grippe, dentist, last visit 2019  Patient Care Team: Unk Pinto, MD as PCP - General (Internal Medicine)  SURGICAL HISTORY She  has a past surgical history that includes Dilation and curettage of uterus (2011); Ablation (2011); Cholecystectomy (2008); Tonsillectomy; buttocks surgery; Cesarean section (1992); right knee arthroscopy; Cervical fusion (03/2010); Colonoscopy; Esophagogastroduodenoscopy; and Lumbar  laminectomy/decompression microdiscectomy (10/24/2012). FAMILY HISTORY Her family history includes Allergies in her mother; Arthritis in her mother; Depression in her mother; Heart attack in her father and mother; Hypertension in her mother. SOCIAL HISTORY She  reports that she has quit smoking. She has never used smokeless tobacco. She reports that she does not drink alcohol or use drugs.   MEDICARE WELLNESS OBJECTIVES: Physical activity: Current Exercise Habits: Home exercise routine, Type of exercise: walking;strength training/weights, Time (Minutes): 45, Frequency (Times/Week): 7, Weekly Exercise (Minutes/Week): 315, Intensity: Mild, Exercise limited by: orthopedic condition(s) Cardiac risk factors: Cardiac Risk Factors include: dyslipidemia;hypertension;smoking/ tobacco exposure Depression/mood screen:   Depression screen [facility] 2/9 03/30/2018  Decreased Interest 0  Down, Depressed, Hopeless 1  PHQ - 2 Score 1    ADLs:  In your present state of health, do you have any difficulty performing the following activities: 03/30/2018 12/21/2017  Hearing?  Y N  Comment Has bilateral hearing aids -  Vision? N N  Difficulty concentrating or making decisions? N N  Walking or climbing stairs? N N  Dressing or bathing? N N  Doing errands, shopping? N N  Some recent data might be hidden     Cognitive Testing  Alert? Yes  Normal Appearance?Yes  Oriented to person? Yes  Place? Yes   Time? Yes  Recall of three objects?  Yes  Can perform simple calculations? Yes  Displays appropriate judgment?Yes  Can read the correct time from a watch face?Yes  EOL planning: Does Patient Have a Medical Advance Directive?: Yes Type of Advance Directive: Healthcare Power of Attorney, Living will Does patient want to make changes to medical advance directive?: No - Patient declined Copy of [place] in Chart?: No - copy requested  Review of Systems  Constitutional: Negative for malaise/fatigue  and weight loss.  HENT: Negative for hearing loss and tinnitus.   Eyes: Negative for blurred vision and double vision.  Respiratory: Negative for cough, sputum production, shortness of breath and wheezing.   Cardiovascular: Negative for chest pain, palpitations, orthopnea, claudication, leg swelling and PND.  Gastrointestinal: Negative for abdominal pain, blood in stool, constipation, diarrhea, heartburn, melena, nausea and vomiting.  Genitourinary: Negative.   Musculoskeletal: Positive for back pain and joint pain. Negative for falls and myalgias.  Skin: Negative for rash.  Neurological: Negative for dizziness, tingling, sensory change, weakness and headaches.  Endo/Heme/Allergies: Negative for polydipsia.  Psychiatric/Behavioral: Negative.  Negative for depression, memory loss, substance abuse and suicidal ideas. The patient is not nervous/anxious and does not have insomnia.   All other systems reviewed and are negative.    Objective:     Today's Vitals   03/30/18 1143  BP: 120/78  Pulse: 76  Temp: (!) 97.5 F (36.4 C)  SpO2: 99%  Weight: 119 lb (54 kg)  Height: 5\' 1"  (1.549 m)   Body mass index is 22.48 kg/m.  General appearance: alert, no distress, WD/WN, female HEENT: normocephalic, sclerae anicteric, TMs pearly, nares patent, no discharge or erythema, pharynx normal Oral cavity: MMM, no lesions Neck: supple, no lymphadenopathy, no thyromegaly, no masses Heart: RRR, normal S1, S2, no murmurs Lungs: CTA bilaterally, no wheezes, rhonchi, or rales Abdomen: +bs, soft, non tender, non distended, no masses, no hepatomegaly, no splenomegaly Musculoskeletal: nontender, no swelling, no obvious deformity Extremities: no edema, no cyanosis, no clubbing Pulses: 2+ symmetric, upper and lower extremities, normal cap refill Neurological: alert, oriented x 3, CN2-12 intact, strength normal upper extremities and lower extremities, sensation normal throughout, DTRs 2+ throughout, no  cerebellar signs, gait normal Psychiatric: normal affect, behavior normal, pleasant   Medicare Attestation I have personally reviewed: The patient's medical and social history Their use of alcohol, tobacco or illicit drugs Their current medications and supplements The patient's functional ability including ADLs,fall risks, home safety risks, cognitive, and hearing and visual impairment Diet and physical activities Evidence for depression or mood disorders  The patient's weight, height, BMI, and visual acuity have been recorded in the chart.  I have made referrals, counseling, and provided education to the patient based on review of the above and I have provided the patient with a written personalized care plan for preventive services.     Izora Ribas, NP   03/30/2018

## 2018-03-30 ENCOUNTER — Encounter: Payer: Self-pay | Admitting: Adult Health

## 2018-03-30 ENCOUNTER — Ambulatory Visit (INDEPENDENT_AMBULATORY_CARE_PROVIDER_SITE_OTHER): Payer: PPO | Admitting: Adult Health

## 2018-03-30 VITALS — BP 120/78 | HR 76 | Temp 97.5°F | Ht 61.0 in | Wt 119.0 lb

## 2018-03-30 DIAGNOSIS — R7303 Prediabetes: Secondary | ICD-10-CM

## 2018-03-30 DIAGNOSIS — E782 Mixed hyperlipidemia: Secondary | ICD-10-CM | POA: Diagnosis not present

## 2018-03-30 DIAGNOSIS — Z0001 Encounter for general adult medical examination with abnormal findings: Secondary | ICD-10-CM | POA: Diagnosis not present

## 2018-03-30 DIAGNOSIS — I10 Essential (primary) hypertension: Secondary | ICD-10-CM | POA: Diagnosis not present

## 2018-03-30 DIAGNOSIS — Z6822 Body mass index (BMI) 22.0-22.9, adult: Secondary | ICD-10-CM

## 2018-03-30 DIAGNOSIS — R6889 Other general symptoms and signs: Secondary | ICD-10-CM | POA: Diagnosis not present

## 2018-03-30 DIAGNOSIS — M549 Dorsalgia, unspecified: Secondary | ICD-10-CM

## 2018-03-30 DIAGNOSIS — Z0000 Encounter for general adult medical examination without abnormal findings: Secondary | ICD-10-CM

## 2018-03-30 DIAGNOSIS — J45909 Unspecified asthma, uncomplicated: Secondary | ICD-10-CM

## 2018-03-30 DIAGNOSIS — M159 Polyosteoarthritis, unspecified: Secondary | ICD-10-CM

## 2018-03-30 DIAGNOSIS — M4316 Spondylolisthesis, lumbar region: Secondary | ICD-10-CM | POA: Diagnosis not present

## 2018-03-30 DIAGNOSIS — Z79899 Other long term (current) drug therapy: Secondary | ICD-10-CM

## 2018-03-30 DIAGNOSIS — K219 Gastro-esophageal reflux disease without esophagitis: Secondary | ICD-10-CM | POA: Diagnosis not present

## 2018-03-30 DIAGNOSIS — G8929 Other chronic pain: Secondary | ICD-10-CM

## 2018-03-30 DIAGNOSIS — E559 Vitamin D deficiency, unspecified: Secondary | ICD-10-CM | POA: Diagnosis not present

## 2018-03-30 DIAGNOSIS — F3341 Major depressive disorder, recurrent, in partial remission: Secondary | ICD-10-CM | POA: Diagnosis not present

## 2018-03-30 DIAGNOSIS — M150 Primary generalized (osteo)arthritis: Secondary | ICD-10-CM | POA: Diagnosis not present

## 2018-03-30 DIAGNOSIS — E039 Hypothyroidism, unspecified: Secondary | ICD-10-CM

## 2018-03-30 DIAGNOSIS — G43109 Migraine with aura, not intractable, without status migrainosus: Secondary | ICD-10-CM

## 2018-03-30 DIAGNOSIS — M8949 Other hypertrophic osteoarthropathy, multiple sites: Secondary | ICD-10-CM

## 2018-03-30 LAB — CBC WITH DIFFERENTIAL/PLATELET
Basophils Absolute: 40 cells/uL (ref 0–200)
Basophils Relative: 0.7 %
Eosinophils Absolute: 171 cells/uL (ref 15–500)
Eosinophils Relative: 3 %
HCT: 37.7 % (ref 35.0–45.0)
Hemoglobin: 12.6 g/dL (ref 11.7–15.5)
Lymphs Abs: 1528 cells/uL (ref 850–3900)
MCH: 28.8 pg (ref 27.0–33.0)
MCHC: 33.4 g/dL (ref 32.0–36.0)
MCV: 86.1 fL (ref 80.0–100.0)
MPV: 9.8 fL (ref 7.5–12.5)
Monocytes Relative: 7.7 %
Neutro Abs: 3523 cells/uL (ref 1500–7800)
Neutrophils Relative %: 61.8 %
Platelets: 286 10*3/uL (ref 140–400)
RBC: 4.38 10*6/uL (ref 3.80–5.10)
RDW: 12.6 % (ref 11.0–15.0)
Total Lymphocyte: 26.8 %
WBC mixed population: 439 cells/uL (ref 200–950)
WBC: 5.7 10*3/uL (ref 3.8–10.8)

## 2018-03-30 LAB — LIPID PANEL
Cholesterol: 180 mg/dL (ref <200)
HDL: 49 mg/dL — ABNORMAL LOW (ref >50)
LDL Cholesterol (Calc): 105 mg/dL (calc) — ABNORMAL HIGH
Non-HDL Cholesterol (Calc): 131 mg/dL (calc) — ABNORMAL HIGH (ref <130)
Total CHOL/HDL Ratio: 3.7 (calc) (ref <5.0)
Triglycerides: 145 mg/dL (ref <150)

## 2018-03-30 LAB — COMPLETE METABOLIC PANEL WITH GFR
AG Ratio: 2.3 (calc) (ref 1.0–2.5)
ALT: 21 U/L (ref 6–29)
AST: 24 U/L (ref 10–35)
Albumin: 4.6 g/dL (ref 3.6–5.1)
Alkaline phosphatase (APISO): 66 U/L (ref 33–130)
BUN: 19 mg/dL (ref 7–25)
CO2: 28 mmol/L (ref 20–32)
Calcium: 9.4 mg/dL (ref 8.6–10.4)
Chloride: 105 mmol/L (ref 98–110)
Creat: 0.93 mg/dL (ref 0.50–0.99)
GFR, Est African American: 77 mL/min/{1.73_m2} (ref >=60)
GFR, Est Non African American: 67 mL/min/{1.73_m2} (ref >=60)
Globulin: 2 g/dL (calc) (ref 1.9–3.7)
Glucose, Bld: 111 mg/dL — ABNORMAL HIGH (ref 65–99)
Potassium: 4.2 mmol/L (ref 3.5–5.3)
Sodium: 143 mmol/L (ref 135–146)
Total Bilirubin: 0.3 mg/dL (ref 0.2–1.2)
Total Protein: 6.6 g/dL (ref 6.1–8.1)

## 2018-03-30 LAB — TSH: TSH: 1.02 mIU/L (ref 0.40–4.50)

## 2018-03-30 MED ORDER — LEVOTHYROXINE SODIUM 50 MCG PO TABS
ORAL_TABLET | ORAL | 1 refills | Status: DC
Start: 2018-03-30 — End: 2019-01-08

## 2018-03-30 NOTE — Patient Instructions (Signed)
Aim for 7+ servings of fruits and vegetables daily  1/2 your body weight in fluid ounces of water or unsweet tea for healthy kidneys  Limit alcohol intake - max 1 drink per day  Limit animal fats in diet for cholesterol and heart health - choose grass fed whenever available  Aim for low stress - take time to unwind and care for your mental health  Aim for 150 min of moderate intensity exercise weekly for heart health, and weights twice weekly for bone health  Aim for 7-9 hours of sleep daily

## 2018-03-31 LAB — HEMOGLOBIN A1C
Hgb A1c MFr Bld: 5.9 % of total Hgb — ABNORMAL HIGH (ref <5.7)
Mean Plasma Glucose: 123 (calc)
eAG (mmol/L): 6.8 (calc)

## 2018-05-01 ENCOUNTER — Other Ambulatory Visit: Payer: Self-pay | Admitting: Internal Medicine

## 2018-05-10 ENCOUNTER — Encounter: Payer: Self-pay | Admitting: Adult Health

## 2018-05-10 ENCOUNTER — Ambulatory Visit (INDEPENDENT_AMBULATORY_CARE_PROVIDER_SITE_OTHER): Payer: PPO | Admitting: Adult Health

## 2018-05-10 VITALS — BP 136/84 | HR 92 | Temp 97.0°F | Resp 16 | Ht 61.0 in | Wt 124.2 lb

## 2018-05-10 DIAGNOSIS — R059 Cough, unspecified: Secondary | ICD-10-CM

## 2018-05-10 DIAGNOSIS — R05 Cough: Secondary | ICD-10-CM | POA: Diagnosis not present

## 2018-05-10 MED ORDER — PREDNISONE 20 MG PO TABS: ORAL_TABLET | ORAL | 0 refills | Status: DC

## 2018-05-10 MED ORDER — HYDROCODONE-HOMATROPINE 5-1.5 MG/5ML PO SYRP: 5.0000 mL | ORAL_SOLUTION | Freq: Four times a day (QID) | ORAL | 0 refills | Status: DC | PRN

## 2018-05-10 NOTE — Progress Notes (Signed)
Assessment and Plan:  Sarah Rodriguez was seen today for cough.  Diagnoses and all orders for this visit:  Cough R/t dust; seal off work areas as best as possible, wear mask while dusting/vaccuuming, keep bedroom door closed. Continue current home medications, can take hycodan at night. Do not take hycodan with klonopin. F/u as needed.  -     predniSONE (DELTASONE) 20 MG tablet; 2 tablets daily for 3 days, 1 tablet daily for 4 days. -     HYDROcodone-homatropine (HYCODAN) 5-1.5 MG/5ML syrup; Take 5 mLs by mouth every 6 (six) hours as needed for cough.  Further disposition pending results of labs. Discussed med's effects and SE's.   Over 15 minutes of exam, counseling, chart review, and critical decision making was performed.   Future Appointments  Date Time Provider [place]  06/13/2018  2:15 PM Arfeen, Arlyce Harman, MD [facility] None  07/03/2018  2:00 PM Unk Pinto, MD [facility] None    ------------------------------------------------------------------------------------------------------------------  HPI BP 136/84   Pulse 92   Temp (!) 97 F (36.1 C)   Resp 16   Ht 5\' 1"  (1.549 m)   Wt 124 lb 3.2 oz (56.3 kg)   BMI 23.47 kg/m   62 y.o.female with hx of asthma presents for evaluation of cough; she reports she is currently having home renovations, lots of dust, has been dusting and has had increasing dry cough that is settling into her chest. Demonstrates occasional barky cough fits while in office today. Denies fever/chills, HA, dizziness, nasal congestion (other than baseline with allergies, chronic), sore throat, watery eyes, itching, chest pain, dyspnea, wheezing, nausea/vomiting.   She continues to take advair, singulair, allergra-D daily.   She has tried promethazine DM, robitussin DM, tessalon perles without much improvement  Past Medical History:  Diagnosis Date  . Asthma    Dulera daily  . Chronic back pain    HNP  . Complication of anesthesia    slow to wake up  .  Depression    takes Clonazepam nightly  . GERD (gastroesophageal reflux disease)    takes Protonix daily   . Heart murmur   . History of migraine    takes Relpax daily as needed  . HSV (herpes simplex virus) infection   . Hyperlipidemia    takes Atorvastatin daily  . Hypertension    takes Benicar daily  . Hypothyroidism    takes Synthroid daily  . MVP (mitral valve prolapse)   . Pneumonia    hx of--been many yrs ago  . Seasonal allergies    takes Allegra daily  . Shortness of breath    with exertion     Allergies  Allergen Reactions  . Demerol [Meperidine] Other (See Comments)    REACTION: unknown--itching  . Epinephrine Other (See Comments)    "Sensitivity."  . Niacin And Related Rash    Current Outpatient Medications on File Prior to Visit  Medication Sig  . ADVAIR HFA 115-21 MCG/ACT inhaler INHALE 2 PUFFS BY MOUTH EVERY 12 HOURS 10 TO 15 MINUTES APART  . amitriptyline (ELAVIL) 25 MG tablet Take 1 tablet (25 mg total) by mouth at bedtime.  Marland Kitchen atorvastatin (LIPITOR) 10 MG tablet Take 1 tablet (10 mg total) by mouth daily.  . Cholecalciferol (VITAMIN D PO) Take 4,000 Units by mouth daily.   Marland Kitchen CINNAMON PO Take 1,000 Units by mouth 2 (two) times daily.   . clonazePAM (KLONOPIN) 0.5 MG tablet Take 1/2 to 1 tablet 2 x / day Only if needed for  Anxiety Attack or sleep & please try to limit to 5 days /week to avoid addiction  . Cyanocobalamin (VITAMIN B-12) 2500 MCG SUBL Place 1 tablet under the tongue daily.   . diclofenac sodium (VOLTAREN) 1 % GEL Apply 4 g topically 4 (four) times daily.  . DULoxetine (CYMBALTA) 30 MG capsule TAKE 1 CAPSULE BY MOUTH DAILY  . DULoxetine (CYMBALTA) 60 MG capsule Take 1 capsule (60 mg total) by mouth daily.  . enalapril (VASOTEC) 20 MG tablet TAKE 1 TABLET BY MOUTH DAILY  . fexofenadine-pseudoephedrine (ALLEGRA-D 12 HOUR) 60-120 MG per tablet Take 1 tablet by mouth daily.  . fluocinonide cream (LIDEX) 0.05 % Apply to rash 2 x / day as needed  .  fluticasone (FLONASE) 50 MCG/ACT nasal spray SHAKE WELL AND USE 2 SPRAYS IN EACH NOSTRIL EVERY EVENING  . gabapentin (NEURONTIN) 100 MG capsule Take 100 mg by mouth 3 (three) times daily.  . halobetasol (ULTRAVATE) 0.05 % ointment   . lamoTRIgine (LAMICTAL) 25 MG tablet Take 1 tab daily  . levothyroxine (SYNTHROID, LEVOTHROID) 50 MCG tablet TAKE 1/2 TABLET(25 MCG) BY MOUTH DAILY  . Magnesium 250 MG TABS Take 3 tablets by mouth daily.   . meloxicam (MOBIC) 15 MG tablet TAKE 1 TABLET(15 MG) BY MOUTH DAILY  . montelukast (SINGULAIR) 10 MG tablet TAKE 1 TABLET(10 MG) BY MOUTH AT BEDTIME  . nystatin (MYCOSTATIN) 100000 UNIT/ML suspension Take 5 mLs (500,000 Units total) by mouth 4 (four) times daily.  . pantoprazole (PROTONIX) 40 MG tablet TAKE 1 TABLET(40 MG) BY MOUTH TWICE DAILY  . PREMARIN vaginal cream INSERT 1/2 GRAM VAGINALLY VIA APPLICATOR NIGHTLY FOR W EEKS THEN JUST DO TEICE A WEEK  . promethazine (PHENERGAN) 25 MG tablet Take 1 tablet (25 mg total) by mouth every 4 (four) hours as needed for nausea or vomiting.   No current facility-administered medications on file prior to visit.     ROS: all negative except above.   Physical Exam:  BP 136/84   Pulse 92   Temp (!) 97 F (36.1 C)   Resp 16   Ht 5\' 1"  (1.549 m)   Wt 124 lb 3.2 oz (56.3 kg)   BMI 23.47 kg/m   General Appearance: Well nourished, in no apparent distress. Eyes: PERRLA, EOMs, conjunctiva no swelling or erythema Sinuses: No Frontal/maxillary tenderness ENT/Mouth: Ext aud canals clear, TMs without erythema, bulging. No erythema, swelling, or exudate on post pharynx.  Tonsils not swollen or erythematous. Hearing normal.  Neck: Supple, thyroid normal.  Respiratory: Respiratory effort normal, BS equal bilaterally without rales, rhonchi, wheezing or stridor.  Cardio: RRR with no MRGs. Brisk peripheral pulses without edema.  Abdomen: Soft, + BS.  Non tender, no guarding, rebound, hernias, masses. Lymphatics: Non tender  without lymphadenopathy.  Musculoskeletal: normal gait.  Skin: Warm, dry without rashes, lesions, ecchymosis.  Psych: Awake and oriented X 3, normal affect, Insight and Judgment appropriate.     Izora Ribas, NP 5:18 PM [facility]

## 2018-05-10 NOTE — Patient Instructions (Addendum)
Do not take hycodan cough syrup with klonopin  Irrigate your nose regularly while at home, keep door closed, vaccume/dust with mask on     Common causes of cough OR hoarseness OR sore throat:   Allergies, Viral Infections, Acid Reflux and Bacterial Infections.   Allergies and viral infections cause a cough OR sore throat by post nasal drip and are often worse at night, can also have sneezing, lower grade fevers, clear/yellow mucus. This is best treated with allergy medications or nasal sprays.  Please get on allegra for 1-2 weeks The strongest is allegra or fexafinadine  Cheapest at [employer], [employer], [employer]   Bacterial infections are more severe than allergies or viral infections with fever, teeth pain, fatigue. This can be treated with prednisone and the same over the counter medication and after 7 days can be treated with an antibiotic.   Silent reflux/GERD can cause a cough OR sore throat OR hoarseness WITHOUT heart burn because the esophagus that goes to the stomach and trachea that goes to the lungs are very close and when you lay down the acid can irritate your throat and lungs. This can cause hoarseness, cough, and wheezing. Please stop any alcohol or anti-inflammatories like aleve/advil/ibuprofen and start an over the counter Prilosec or omeprazole 1-2 times daily 3mins before food for 2 weeks, then switch to over the counter zantac/ratinidine or pepcid/famotadine once at night for 2 weeks.    sometimes irritation causes more irritation. Try voice rest, use sugar free cough drops to prevent coughing, and try to stop clearing your throat.   If you ever have a cough that does not go away after trying these things please make a follow up visit for further evaluation or we can refer you to a specialist. Or if you ever have shortness of breath or chest pain go to the ER.

## 2018-05-12 ENCOUNTER — Other Ambulatory Visit: Payer: Self-pay | Admitting: *Deleted

## 2018-05-12 MED ORDER — DULOXETINE HCL 60 MG PO CPEP: 60.0000 mg | ORAL_CAPSULE | Freq: Every day | ORAL | 1 refills | Status: DC

## 2018-06-05 ENCOUNTER — Other Ambulatory Visit: Payer: Self-pay | Admitting: Internal Medicine

## 2018-06-13 ENCOUNTER — Ambulatory Visit (HOSPITAL_COMMUNITY): Payer: Self-pay | Admitting: Psychiatry

## 2018-06-21 ENCOUNTER — Ambulatory Visit (HOSPITAL_COMMUNITY): Payer: Self-pay | Admitting: Psychiatry

## 2018-07-03 ENCOUNTER — Encounter: Payer: Self-pay | Admitting: Internal Medicine

## 2018-07-16 ENCOUNTER — Encounter: Payer: Self-pay | Admitting: Internal Medicine

## 2018-07-16 NOTE — Progress Notes (Signed)
[facility] Unk Pinto, M.D.     Uvaldo Bristle. Silverio Lay, P.A.-C Liane Comber, [place] [address] [place], [place] [address] Telephone [phone] Telefax [phone] Annual Screening/Preventative Visit & Comprehensive Evaluation &  Examination     This very nice 62 y.o. MWF presents for a Screening /Preventative Visit & comprehensive evaluation and management of multiple medical co-morbidities.  Patient has been followed for HTN, HLD, Prediabetes  and Vitamin D Deficiency. Patient's GERD is controlled on her meds.     Patient also has Chronic Pain Syndrome consequent of  Cx DDD / Cx HNP surg in 2011. In 2013, she underwent L5/S1 Surg by Dr Ellene Route and again in Nov 2016, she had a Lumbar Fusion. She is on SS Disability since 2016. She reports chronic intermittent RLE pain.      HTN predates circa 2007. Patient's BP has been controlled at home and patient denies any cardiac symptoms as chest pain, palpitations, shortness of breath, dizziness or ankle swelling. Today's BP is at goal - 140/76.      Patient's hyperlipidemia is controlled with diet and medications. Patient denies myalgias or other medication SE's. Last lipids were near goal :  Lab Results  Component Value Date   CHOL 180 03/30/2018   HDL 49 (L) 03/30/2018   LDLCALC 105 (H) 03/30/2018   TRIG 145 03/30/2018   CHOLHDL 3.7 03/30/2018      Patient has hx/o prediabetes (A1c 5.9%/2009 & 6.3%/2014)  and patient denies reactive hypoglycemic symptoms, visual blurring, diabetic polys or paresthesias. Last A1c was still not at goal: Lab Results  Component Value Date   HGBA1C 5.9 (H) 03/30/2018      Patient has been on Thyroid Replacement since 2009.      Finally, patient has history of Vitamin D Deficiency  ("9"/2009)  and last Vitamin D was at goal: Lab Results  Component Value Date   VD25OH 60 12/21/2017   Current Outpatient Medications on File  Prior to Visit  Medication Sig  . ADVAIR HFA 115-21 MCG/ACT inhaler INHALE 2 PUFFS BY MOUTH EVERY 12 HOURS 10 TO 15 MINUTES APART  . amitriptyline (ELAVIL) 25 MG tablet Take 1 tablet (25 mg total) by mouth at bedtime.  Marland Kitchen atorvastatin (LIPITOR) 10 MG tablet Take 1 tablet (10 mg total) by mouth daily.  . Cholecalciferol (VITAMIN D PO) Take 4,000 Units by mouth daily.   Marland Kitchen CINNAMON PO Take 1,000 Units by mouth 2 (two) times daily.   . clonazePAM (KLONOPIN) 0.5 MG tablet Take 1/2 to 1 tablet 2 x / day Only if needed for Anxiety Attack or sleep & please try to limit to 5 days /week to avoid addiction  . Cyanocobalamin (VITAMIN B-12) 2500 MCG SUBL Place 1 tablet under the tongue daily.   . diclofenac sodium (VOLTAREN) 1 % GEL Apply 4 g topically 4 (four) times daily.  . DULoxetine (CYMBALTA) 60 MG capsule Take 1 capsule (60 mg total) by mouth daily.  . enalapril (VASOTEC) 20 MG tablet TAKE 1 TABLET BY MOUTH DAILY  . fexofenadine-pseudoephedrine (ALLEGRA-D 12 HOUR) 60-120 MG per tablet Take 1 tablet by mouth daily.  . fluocinonide cream (LIDEX) 0.05 % Apply to rash 2 x / day as needed  . fluticasone (FLONASE) 50 MCG/ACT nasal spray SHAKE WELL AND USE 2 SPRAYS IN EACH NOSTRIL EVERY EVENING  . gabapentin (NEURONTIN) 100 MG capsule Take 100 mg by mouth 3 (three) times daily.  . halobetasol (ULTRAVATE) 0.05 %  ointment   . HYDROcodone-homatropine (HYCODAN) 5-1.5 MG/5ML syrup Take 5 mLs by mouth every 6 (six) hours as needed for cough.  . lamoTRIgine (LAMICTAL) 25 MG tablet Take 1 tab daily  . levothyroxine (SYNTHROID, LEVOTHROID) 50 MCG tablet TAKE 1/2 TABLET(25 MCG) BY MOUTH DAILY  . Magnesium 250 MG TABS Take 3 tablets by mouth daily.   . meloxicam (MOBIC) 15 MG tablet TAKE 1 TABLET(15 MG) BY MOUTH DAILY  . montelukast (SINGULAIR) 10 MG tablet TAKE 1 TABLET(10 MG) BY MOUTH AT BEDTIME  . nystatin (MYCOSTATIN) 100000 UNIT/ML suspension Take 5 mLs (500,000 Units total) by mouth 4 (four) times daily.  .  pantoprazole (PROTONIX) 40 MG tablet TAKE 1 TABLET(40 MG) BY MOUTH TWICE DAILY  . PREMARIN vaginal cream INSERT 1/2 GRAM VAGINALLY VIA APPLICATOR NIGHTLY FOR W EEKS THEN JUST DO TEICE A WEEK  . promethazine (PHENERGAN) 25 MG tablet Take 1 tablet (25 mg total) by mouth every 4 (four) hours as needed for nausea or vomiting.   No current facility-administered medications on file prior to visit.    Allergies  Allergen Reactions  . Demerol [Meperidine] Other (See Comments)    REACTION: unknown--itching  . Epinephrine Other (See Comments)    "Sensitivity."  . Niacin And Related Rash   Past Medical History:  Diagnosis Date  . Asthma    Dulera daily  . Chronic back pain    HNP  . Complication of anesthesia    slow to wake up  . Depression    takes Clonazepam nightly  . GERD (gastroesophageal reflux disease)    takes Protonix daily   . Heart murmur   . History of migraine    takes Relpax daily as needed  . HSV (herpes simplex virus) infection   . Hyperlipidemia    takes Atorvastatin daily  . Hypertension    takes Benicar daily  . Hypothyroidism    takes Synthroid daily  . MVP (mitral valve prolapse)   . Pneumonia    hx of--been many yrs ago  . Seasonal allergies    takes Allegra daily  . Shortness of breath    with exertion   Health Maintenance  Topic Date Due  . INFLUENZA VACCINE  06/29/2018  . MAMMOGRAM  10/04/2018  . COLONOSCOPY  10/04/2023  . TETANUS/TDAP  05/05/2026  . Hepatitis C Screening  Completed  . HIV Screening  Completed   Immunization History  Administered Date(s) Administered  . Influenza Inj Mdck Quad With Preservative 09/14/2017  . Influenza,inj,quad, With Preservative 11/09/2016  . Influenza-Unspecified 09/18/2013  . PPD Test 04/17/2014  . Pneumococcal-Unspecified 11/30/1995  . Td 08/27/2005, 05/05/2016   Last Colon - 10/03/2013 - Dr Kathleen Lime - recc 5 yr f/u in Nov 2019. Last MGM - 10/04/2017  Past Surgical History:  Procedure Laterality Date   . ABLATION  2011   Uterine  . buttocks surgery     at age 29 (coccyx repair)  . CERVICAL FUSION  03/2010  . CESAREAN SECTION  1992  . CHOLECYSTECTOMY  2008  . COLONOSCOPY    . DILATION AND CURETTAGE OF UTERUS  2011  . ESOPHAGOGASTRODUODENOSCOPY    . LUMBAR LAMINECTOMY/DECOMPRESSION MICRODISCECTOMY  10/24/2012   Procedure: LUMBAR LAMINECTOMY/DECOMPRESSION MICRODISCECTOMY 1 LEVEL;  Surgeon: Kristeen Miss, MD;  Location: [facility];  Service: Neurosurgery;  Laterality: Right;  Right Lumbar five-Sacral One Microdiskectomy  . right knee arthroscopy     x 3  . TONSILLECTOMY     at age 55   Family History  Problem Relation Age of Onset  . Arthritis Mother   . Heart attack Mother   . Depression Mother   . Allergies Mother   . Hypertension Mother   . Heart attack Father    Social History   Tobacco Use  . Smoking status: Former Research scientist (life sciences)  . Smokeless tobacco: Never Used  . Tobacco comment: quit at age 87  Substance Use Topics  . Alcohol use: No    Comment: 2-3 times a yr  . Drug use: No    ROS Constitutional: Denies fever, chills, weight loss/gain, headaches, insomnia,  night sweats, and change in appetite. Does c/o fatigue. Eyes: Denies redness, blurred vision, diplopia, discharge, itchy, watery eyes.  ENT: Denies discharge, congestion, post nasal drip, epistaxis, sore throat, earache, hearing loss, dental pain, Tinnitus, Vertigo, Sinus pain, snoring.  Cardio: Denies chest pain, palpitations, irregular heartbeat, syncope, dyspnea, diaphoresis, orthopnea, PND, claudication, edema Respiratory: denies cough, dyspnea, DOE, pleurisy, hoarseness, laryngitis, wheezing.  Gastrointestinal: Denies dysphagia, heartburn, reflux, water brash, pain, cramps, nausea, vomiting, bloating, diarrhea, constipation, hematemesis, melena, hematochezia, jaundice, hemorrhoids Genitourinary: Denies dysuria, frequency, urgency, nocturia, hesitancy, discharge, hematuria, flank pain Breast: Breast lumps, nipple  discharge, bleeding.  Musculoskeletal: Denies arthralgia, myalgia, stiffness, Jt. Swelling, pain, limp, and strain/sprain. Denies falls. Skin: Denies puritis, rash, hives, warts, acne, eczema, changing in skin lesion Neuro: No weakness, tremor, incoordination, spasms, paresthesia, pain Psychiatric: Denies confusion, memory loss, sensory loss. Denies Depression. Endocrine: Denies change in weight, skin, hair change, nocturia, and paresthesia, diabetic polys, visual blurring, hyper / hypo glycemic episodes.  Heme/Lymph: No excessive bleeding, bruising, enlarged lymph nodes.  Physical Exam  BP 140/76   Pulse 84   Temp 97.9 F (36.6 C)   Resp 16   Ht 5' 1.5" (1.562 m)   Wt 118 lb 9.6 oz (53.8 kg)   BMI 22.05 kg/m   General Appearance: Well nourished, well groomed and in no apparent distress.  Eyes: PERRLA, EOMs, conjunctiva no swelling or erythema, normal fundi and vessels. Sinuses: No frontal/maxillary tenderness ENT/Mouth: EACs patent / TMs  nl. Nares clear without erythema, swelling, mucoid exudates. Oral hygiene is good. No erythema, swelling, or exudate. Tongue normal, non-obstructing. Tonsils not swollen or erythematous. Hearing normal.  Neck: Supple, thyroid not palpable. No bruits, nodes or JVD. Respiratory: Respiratory effort normal.  BS equal and clear bilateral without rales, rhonci, wheezing or stridor. Cardio: Heart sounds are normal with regular rate and rhythm and no murmurs, rubs or gallops. Peripheral pulses are normal and equal bilaterally without edema. No aortic or femoral bruits. Chest: symmetric with normal excursions and percussion. Breasts: Symmetric, without lumps, nipple discharge, retractions, or fibrocystic changes.  Abdomen: Flat, soft with bowel sounds active. Nontender, no guarding, rebound, hernias, masses, or organomegaly.  Lymphatics: Non tender without lymphadenopathy.  Genitourinary:  Musculoskeletal: Full ROM all peripheral extremities, joint  stability, 5/5 strength, and normal gait. Skin: Warm and dry without rashes, lesions, cyanosis, clubbing or  ecchymosis.  Neuro: Cranial nerves intact, reflexes equal bilaterally. Normal muscle tone, no cerebellar symptoms. Sensation intact.  Pysch: Alert and oriented X 3, normal affect, Insight and Judgment appropriate.   Assessment and Plan  1. Annual Preventative Screening Examination  2. Essential hypertension  - EKG 12-Lead - Urinalysis, Routine w reflex microscopic - Microalbumin / creatinine urine ratio - CBC with Differential/Platelet - COMPLETE METABOLIC PANEL WITH GFR - Magnesium - TSH  3. Hyperlipidemia, mixed  - EKG 12-Lead - Lipid panel - TSH  4. Abnormal glucose  - EKG 12-Lead -  Hemoglobin A1c - Insulin, random  5. Vitamin D deficiency  - VITAMIN D 25 Hydroxy (Vit-D Deficiency, Fractures)  6. Prediabetes  - EKG 12-Lead - Insulin, random  7. Hypothyroidism  - TSH  8. Gastroesophageal reflux disease  - COMPLETE METABOLIC PANEL WITH GFR  9. DDD (degenerative disc disease), cervical   10. Chronic Back Pain   11. Screening for ischemic heart disease  - EKG 12-Lead  12. Former smoker  - EKG 12-Lead  13. FHx: heart disease  - EKG 12-Lead  14. Medication management  - Urinalysis, Routine w reflex microscopic - Microalbumin / creatinine urine ratio - CBC with Differential/Platelet - COMPLETE METABOLIC PANEL WITH GFR - Magnesium - Lipid panel - TSH - Hemoglobin A1c - Insulin, random - VITAMIN D 25 Hydroxyl       Patient was counseled in prudent diet to achieve/maintain BMI less than 25 for weight control, BP monitoring, regular exercise and medications. Discussed med's effects and SE's. Screening labs and tests as requested with regular follow-up as recommended. Over 40 minutes of exam, counseling, chart review and high complex critical decision making was performed.

## 2018-07-16 NOTE — Patient Instructions (Signed)

## 2018-07-17 ENCOUNTER — Ambulatory Visit (INDEPENDENT_AMBULATORY_CARE_PROVIDER_SITE_OTHER): Payer: PPO | Admitting: Internal Medicine

## 2018-07-17 VITALS — BP 140/76 | HR 84 | Temp 97.9°F | Resp 16 | Ht 61.5 in | Wt 118.6 lb

## 2018-07-17 DIAGNOSIS — Z136 Encounter for screening for cardiovascular disorders: Secondary | ICD-10-CM

## 2018-07-17 DIAGNOSIS — M5030 Other cervical disc degeneration, unspecified cervical region: Secondary | ICD-10-CM

## 2018-07-17 DIAGNOSIS — Z0001 Encounter for general adult medical examination with abnormal findings: Secondary | ICD-10-CM

## 2018-07-17 DIAGNOSIS — R7303 Prediabetes: Secondary | ICD-10-CM | POA: Diagnosis not present

## 2018-07-17 DIAGNOSIS — G8929 Other chronic pain: Secondary | ICD-10-CM

## 2018-07-17 DIAGNOSIS — I10 Essential (primary) hypertension: Secondary | ICD-10-CM

## 2018-07-17 DIAGNOSIS — R7309 Other abnormal glucose: Secondary | ICD-10-CM

## 2018-07-17 DIAGNOSIS — E039 Hypothyroidism, unspecified: Secondary | ICD-10-CM | POA: Diagnosis not present

## 2018-07-17 DIAGNOSIS — Z0000 Encounter for general adult medical examination without abnormal findings: Secondary | ICD-10-CM | POA: Diagnosis not present

## 2018-07-17 DIAGNOSIS — E782 Mixed hyperlipidemia: Secondary | ICD-10-CM

## 2018-07-17 DIAGNOSIS — M549 Dorsalgia, unspecified: Secondary | ICD-10-CM

## 2018-07-17 DIAGNOSIS — E559 Vitamin D deficiency, unspecified: Secondary | ICD-10-CM

## 2018-07-17 DIAGNOSIS — K219 Gastro-esophageal reflux disease without esophagitis: Secondary | ICD-10-CM

## 2018-07-17 DIAGNOSIS — Z87891 Personal history of nicotine dependence: Secondary | ICD-10-CM

## 2018-07-17 DIAGNOSIS — Z8249 Family history of ischemic heart disease and other diseases of the circulatory system: Secondary | ICD-10-CM

## 2018-07-17 DIAGNOSIS — Z79899 Other long term (current) drug therapy: Secondary | ICD-10-CM

## 2018-07-18 ENCOUNTER — Other Ambulatory Visit (HOSPITAL_COMMUNITY): Payer: Self-pay | Admitting: Psychiatry

## 2018-07-18 ENCOUNTER — Other Ambulatory Visit: Payer: Self-pay | Admitting: Internal Medicine

## 2018-07-18 DIAGNOSIS — F331 Major depressive disorder, recurrent, moderate: Secondary | ICD-10-CM

## 2018-07-18 DIAGNOSIS — E782 Mixed hyperlipidemia: Secondary | ICD-10-CM

## 2018-07-18 LAB — CBC WITH DIFFERENTIAL/PLATELET
Basophils Absolute: 40 cells/uL (ref 0–200)
Basophils Relative: 0.5 %
Eosinophils Absolute: 166 cells/uL (ref 15–500)
Eosinophils Relative: 2.1 %
HCT: 42.9 % (ref 35.0–45.0)
Hemoglobin: 14.1 g/dL (ref 11.7–15.5)
Lymphs Abs: 2204 cells/uL (ref 850–3900)
MCH: 28.8 pg (ref 27.0–33.0)
MCHC: 32.9 g/dL (ref 32.0–36.0)
MCV: 87.7 fL (ref 80.0–100.0)
MPV: 10.1 fL (ref 7.5–12.5)
Monocytes Relative: 6.4 %
Neutro Abs: 4985 cells/uL (ref 1500–7800)
Neutrophils Relative %: 63.1 %
Platelets: 334 10*3/uL (ref 140–400)
RBC: 4.89 10*6/uL (ref 3.80–5.10)
RDW: 13.1 % (ref 11.0–15.0)
Total Lymphocyte: 27.9 %
WBC mixed population: 506 cells/uL (ref 200–950)
WBC: 7.9 10*3/uL (ref 3.8–10.8)

## 2018-07-18 LAB — LIPID PANEL
Cholesterol: 208 mg/dL — ABNORMAL HIGH (ref <200)
HDL: 49 mg/dL — ABNORMAL LOW (ref >50)
LDL Cholesterol (Calc): 133 mg/dL (calc) — ABNORMAL HIGH
Non-HDL Cholesterol (Calc): 159 mg/dL (calc) — ABNORMAL HIGH (ref <130)
Total CHOL/HDL Ratio: 4.2 (calc) (ref <5.0)
Triglycerides: 150 mg/dL — ABNORMAL HIGH (ref <150)

## 2018-07-18 LAB — URINALYSIS, ROUTINE W REFLEX MICROSCOPIC
Bilirubin Urine: NEGATIVE
Glucose, UA: NEGATIVE
Hgb urine dipstick: NEGATIVE
Ketones, ur: NEGATIVE
Leukocytes, UA: NEGATIVE
Nitrite: NEGATIVE
Protein, ur: NEGATIVE
Specific Gravity, Urine: 1.019 (ref 1.001–1.03)
pH: <=5 (ref 5.0–8.0)

## 2018-07-18 LAB — COMPLETE METABOLIC PANEL WITH GFR
AG Ratio: 2.2 (calc) (ref 1.0–2.5)
ALT: 21 U/L (ref 6–29)
AST: 23 U/L (ref 10–35)
Albumin: 5.1 g/dL (ref 3.6–5.1)
Alkaline phosphatase (APISO): 77 U/L (ref 33–130)
BUN/Creatinine Ratio: 19 (calc) (ref 6–22)
BUN: 19 mg/dL (ref 7–25)
CO2: 27 mmol/L (ref 20–32)
Calcium: 10.2 mg/dL (ref 8.6–10.4)
Chloride: 102 mmol/L (ref 98–110)
Creat: 1 mg/dL — ABNORMAL HIGH (ref 0.50–0.99)
GFR, Est African American: 70 mL/min/{1.73_m2} (ref >=60)
GFR, Est Non African American: 61 mL/min/{1.73_m2} (ref >=60)
Globulin: 2.3 g/dL (calc) (ref 1.9–3.7)
Glucose, Bld: 137 mg/dL — ABNORMAL HIGH (ref 65–99)
Potassium: 3.9 mmol/L (ref 3.5–5.3)
Sodium: 142 mmol/L (ref 135–146)
Total Bilirubin: 0.5 mg/dL (ref 0.2–1.2)
Total Protein: 7.4 g/dL (ref 6.1–8.1)

## 2018-07-18 LAB — HEMOGLOBIN A1C
Hgb A1c MFr Bld: 5.9 % of total Hgb — ABNORMAL HIGH (ref <5.7)
Mean Plasma Glucose: 123 (calc)
eAG (mmol/L): 6.8 (calc)

## 2018-07-18 LAB — MICROALBUMIN / CREATININE URINE RATIO
Creatinine, Urine: 152 mg/dL (ref 20–275)
Microalb Creat Ratio: 5 mcg/mg creat (ref <30)
Microalb, Ur: 0.7 mg/dL

## 2018-07-18 LAB — INSULIN, RANDOM: Insulin: 43.2 u[IU]/mL — ABNORMAL HIGH (ref 2.0–19.6)

## 2018-07-18 LAB — MAGNESIUM: Magnesium: 2.1 mg/dL (ref 1.5–2.5)

## 2018-07-18 LAB — VITAMIN D 25 HYDROXY (VIT D DEFICIENCY, FRACTURES): Vit D, 25-Hydroxy: 61 ng/mL (ref 30–100)

## 2018-07-18 LAB — TSH: TSH: 1.7 mIU/L (ref 0.40–4.50)

## 2018-07-18 MED ORDER — ROSUVASTATIN CALCIUM 40 MG PO TABS: ORAL_TABLET | ORAL | 1 refills | Status: DC

## 2018-07-19 ENCOUNTER — Ambulatory Visit (INDEPENDENT_AMBULATORY_CARE_PROVIDER_SITE_OTHER): Payer: PPO | Admitting: Psychiatry

## 2018-07-19 VITALS — BP 124/78 | HR 83 | Ht 61.0 in | Wt 120.0 lb

## 2018-07-19 DIAGNOSIS — F332 Major depressive disorder, recurrent severe without psychotic features: Secondary | ICD-10-CM | POA: Diagnosis not present

## 2018-07-19 DIAGNOSIS — F331 Major depressive disorder, recurrent, moderate: Secondary | ICD-10-CM

## 2018-07-19 DIAGNOSIS — Z87891 Personal history of nicotine dependence: Secondary | ICD-10-CM | POA: Diagnosis not present

## 2018-07-19 DIAGNOSIS — Z818 Family history of other mental and behavioral disorders: Secondary | ICD-10-CM | POA: Diagnosis not present

## 2018-07-19 MED ORDER — LAMOTRIGINE 25 MG PO TABS: ORAL_TABLET | ORAL | 1 refills | Status: DC

## 2018-07-19 MED ORDER — AMITRIPTYLINE HCL 25 MG PO TABS: 25.0000 mg | ORAL_TABLET | Freq: Every day | ORAL | 1 refills | Status: DC

## 2018-07-19 NOTE — Progress Notes (Signed)
BH MD/PA/NP OP Progress Note  07/19/2018 4:39 PM Sarah Rodriguez  MRN:  [id number]  Chief Complaint: I am doing fine.  I am taking medication and they are working.  I stopped seeing Beth because I feel I do not need it.  Today the patient was seen to evaluate refill her medicines. The patient was seen by her previous doctor at our center at bedtime is doing quite well. At this time she showed no differences. She does describe some depression but is not persistent. She says she sleeps and eats fairly well. He's got a good amount of energy. She does not feel fatigued. She's taking taking her medicines as prescribed. At our center she gets all little 25 mg and Lamictal 25 mg. Apparently her primary care doctor prescribes Klonopin and Cymbalta for panic disorder. The patient says the combination of Lamictal and Elavil to help her sleep. She's another physician prescribed Neurontin. Overall this is an abbreviated visit. She denies being suicidal. She is not psychotic. She denies the use of alcohol or drugs. She says physically she is stable. She denies chest pain shortness of breath or any neurological symptoms. Visit Diagnosis:    ICD-10-CM   1. MDD (major depressive disorder), recurrent episode, moderate (HCC) F33.1 lamoTRIgine (LAMICTAL) 25 MG tablet    amitriptyline (ELAVIL) 25 MG tablet    DISCONTINUED: lamoTRIgine (LAMICTAL) 25 MG tablet    Past Psychiatric History: Reviewed. Patient reported history of depression and has been taking antidepressants since 1996. She had a history of panic attacks while driving. She was seeing Dr. Letta Moynahan and tried Prozac, Paxil, Zoloft, Xanax, Cymbalta and Klonopin. Patient denies any history of psychiatric inpatient treatment or any suicidal attempt.  Past Medical History:  Past Medical History:  Diagnosis Date  . Asthma    Dulera daily  . Chronic back pain    HNP  . Complication of anesthesia    slow to wake up  . Depression    takes  Clonazepam nightly  . GERD (gastroesophageal reflux disease)    takes Protonix daily   . Heart murmur   . History of migraine    takes Relpax daily as needed  . HSV (herpes simplex virus) infection   . Hyperlipidemia    takes Atorvastatin daily  . Hypertension    takes Benicar daily  . Hypothyroidism    takes Synthroid daily  . MVP (mitral valve prolapse)   . Pneumonia    hx of--been many yrs ago  . Seasonal allergies    takes Allegra daily  . Shortness of breath    with exertion    Past Surgical History:  Procedure Laterality Date  . ABLATION  2011   Uterine  . buttocks surgery     at age 82 (coccyx repair)  . CERVICAL FUSION  03/2010  . CESAREAN SECTION  1992  . CHOLECYSTECTOMY  2008  . COLONOSCOPY    . DILATION AND CURETTAGE OF UTERUS  2011  . ESOPHAGOGASTRODUODENOSCOPY    . LUMBAR LAMINECTOMY/DECOMPRESSION MICRODISCECTOMY  10/24/2012   Procedure: LUMBAR LAMINECTOMY/DECOMPRESSION MICRODISCECTOMY 1 LEVEL;  Surgeon: Kristeen Miss, MD;  Location: [facility];  Service: Neurosurgery;  Laterality: Right;  Right Lumbar five-Sacral One Microdiskectomy  . right knee arthroscopy     x 3  . TONSILLECTOMY     at age 53    Family Psychiatric History: Reviewed.  Family History:  Family History  Problem Relation Age of Onset  . Arthritis Mother   . Heart attack  Mother   . Depression Mother   . Allergies Mother   . Hypertension Mother   . Heart attack Father     Social History:  Social History   Socioeconomic History  . Marital status: Married    Spouse name: Not on file  . Number of children: Not on file  . Years of education: Not on file  . Highest education level: Not on file  Occupational History  . Not on file  Social Needs  . Financial resource strain: Not on file  . Food insecurity:    Worry: Not on file    Inability: Not on file  . Transportation needs:    Medical: Not on file    Non-medical: Not on file  Tobacco Use  . Smoking status: Former Research scientist (life sciences)   . Smokeless tobacco: Never Used  . Tobacco comment: quit at age 9  Substance and Sexual Activity  . Alcohol use: No    Comment: 2-3 times a yr  . Drug use: No  . Sexual activity: Yes    Birth control/protection: Post-menopausal  Lifestyle  . Physical activity:    Days per week: Not on file    Minutes per session: Not on file  . Stress: Not on file  Relationships  . Social connections:    Talks on phone: Not on file    Gets together: Not on file    Attends religious service: Not on file    Active member of club or organization: Not on file    Attends meetings of clubs or organizations: Not on file    Relationship status: Not on file  Other Topics Concern  . Not on file  Social History Narrative  . Not on file    Allergies:  Allergies  Allergen Reactions  . Demerol [Meperidine] Other (See Comments)    REACTION: unknown--itching  . Epinephrine Other (See Comments)    "Sensitivity."  . Niacin And Related Rash    Metabolic Disorder Labs: Lab Results  Component Value Date   HGBA1C 5.9 (H) 07/17/2018   MPG 123 07/17/2018   MPG 123 03/30/2018   No results found for: PROLACTIN Lab Results  Component Value Date   CHOL 208 (H) 07/17/2018   TRIG 150 (H) 07/17/2018   HDL 49 (L) 07/17/2018   CHOLHDL 4.2 07/17/2018   VLDL 31 (H) 06/06/2017   LDLCALC 133 (H) 07/17/2018   LDLCALC 105 (H) 03/30/2018   Lab Results  Component Value Date   TSH 1.70 07/17/2018   TSH 1.02 03/30/2018    Therapeutic Level Labs: No results found for: LITHIUM No results found for: VALPROATE No components found for:  CBMZ  Current Medications: Current Outpatient Medications  Medication Sig Dispense Refill  . ADVAIR HFA 115-21 MCG/ACT inhaler INHALE 2 PUFFS BY MOUTH EVERY 12 HOURS 10 TO 15 MINUTES APART 3 Inhaler 3  . amitriptyline (ELAVIL) 25 MG tablet Take 1 tablet (25 mg total) by mouth at bedtime. 90 tablet 1  . atorvastatin (LIPITOR) 10 MG tablet Take 1 tablet (10 mg total) by mouth  daily. 90 tablet 0  . Cholecalciferol (VITAMIN D PO) Take 4,000 Units by mouth daily.     Marland Kitchen CINNAMON PO Take 1,000 Units by mouth 2 (two) times daily.     . clonazePAM (KLONOPIN) 0.5 MG tablet Take 1/2 to 1 tablet 2 x / day Only if needed for Anxiety Attack or sleep & please try to limit to 5 days /week to avoid addiction 60 tablet 0  .  Cyanocobalamin (VITAMIN B-12) 2500 MCG SUBL Place 1 tablet under the tongue daily.     . diclofenac sodium (VOLTAREN) 1 % GEL Apply 4 g topically 4 (four) times daily. 100 g 3  . DULoxetine (CYMBALTA) 60 MG capsule Take 1 capsule (60 mg total) by mouth daily. 90 capsule 1  . enalapril (VASOTEC) 20 MG tablet TAKE 1 TABLET BY MOUTH DAILY 90 tablet 1  . fexofenadine-pseudoephedrine (ALLEGRA-D 12 HOUR) 60-120 MG per tablet Take 1 tablet by mouth daily.    . fluocinonide cream (LIDEX) 0.05 % Apply to rash 2 x / day as needed 60 g 3  . fluticasone (FLONASE) 50 MCG/ACT nasal spray SHAKE WELL AND USE 2 SPRAYS IN EACH NOSTRIL EVERY EVENING 48 g 2  . gabapentin (NEURONTIN) 100 MG capsule Take 100 mg by mouth 3 (three) times daily.    . halobetasol (ULTRAVATE) 0.05 % ointment   3  . HYDROcodone-homatropine (HYCODAN) 5-1.5 MG/5ML syrup Take 5 mLs by mouth every 6 (six) hours as needed for cough. 120 mL 0  . lamoTRIgine (LAMICTAL) 25 MG tablet Take 1 tab daily 90 tablet 1  . levothyroxine (SYNTHROID, LEVOTHROID) 50 MCG tablet TAKE 1/2 TABLET(25 MCG) BY MOUTH DAILY 90 tablet 1  . Magnesium 250 MG TABS Take 3 tablets by mouth daily.     . meloxicam (MOBIC) 15 MG tablet TAKE 1 TABLET(15 MG) BY MOUTH DAILY 90 tablet 3  . montelukast (SINGULAIR) 10 MG tablet TAKE 1 TABLET(10 MG) BY MOUTH AT BEDTIME 90 tablet 3  . nystatin (MYCOSTATIN) 100000 UNIT/ML suspension Take 5 mLs (500,000 Units total) by mouth 4 (four) times daily. 473 mL 1  . pantoprazole (PROTONIX) 40 MG tablet TAKE 1 TABLET(40 MG) BY MOUTH TWICE DAILY 180 tablet 1  . PREMARIN vaginal cream INSERT 1/2 GRAM VAGINALLY VIA  APPLICATOR NIGHTLY FOR W EEKS THEN JUST DO TEICE A WEEK  11  . promethazine (PHENERGAN) 25 MG tablet Take 1 tablet (25 mg total) by mouth every 4 (four) hours as needed for nausea or vomiting. 100 tablet 0  . rosuvastatin (CRESTOR) 40 MG tablet Take 1/2 to 1 tablet daily or as directed for Cholesterol 90 tablet 1   No current facility-administered medications for this visit.      Musculoskeletal: Strength & Muscle Tone: within normal limits Gait & Station: normal Patient leans: N/A  Psychiatric Specialty Exam: ROS  Blood pressure 124/78, pulse 83, height 5\' 1"  (1.549 m), weight 120 lb (54.4 kg).Body mass index is 22.67 kg/m.  General Appearance: Casual  Eye Contact:  Good  Speech:  Clear and Coherent  Volume:  Normal  Mood:  Anxious  Affect:  Appropriate  Thought Process:  Goal Directed  Orientation:  Full (Time, Place, and Person)  Thought Content: Logical   Suicidal Thoughts:  No  Homicidal Thoughts:  No  Memory:  Immediate;   Good Recent;   Good Remote;   Good  Judgement:  Good  Insight:  Good  Psychomotor Activity:  Normal  Concentration:  Concentration: Good and Attention Span: Good  Recall:  Good  Fund of Knowledge: Good  Language: Good  Akathisia:  No  Handed:  Right  AIMS (if indicated): not done  Assets:  Communication Skills Desire for Improvement Intimacy Resilience Social Support  ADL's:  Intact  Cognition: WNL  Sleep:  Good   Screenings: PHQ2-9     Office Visit from 07/17/2018 in [facility] [place] [facility] Visit from 03/30/2018 in  [facility]  Office Visit from 12/21/2017 in [facility] [place] Visit from 09/14/2017 in [facility] Office Visit from 06/06/2017 in [facility]  PHQ-2 Total Score  0  1  0  0  0       Assessment and Plan: Major depressive disorder, recurrent.  Anxiety disorder  NOS.  At this time the patient seems stable. She is no vegetative symptoms. She is irritating with her family which is apparently a chronic problem. She is not in therapy. My recommendations were her to try to find a therapist to talk about family issues dynamic. Her first problem therefore is a history of panic disorder or she's receiving Klonopin and Cymbalta from another provider. Her second problem is that insomnia. She takes Elavil for thatpurpose. I strongly recommend the patient get reevaluated by Dr.Arfine at the Center previously her for years. The patient is not suicidal. She's not showing any changes or decline since her last visit according to her.Jerral Ralph, MD 07/19/2018, 4:39 PM

## 2018-07-22 ENCOUNTER — Other Ambulatory Visit: Payer: Self-pay | Admitting: Internal Medicine

## 2018-07-25 DIAGNOSIS — M25561 Pain in right knee: Secondary | ICD-10-CM | POA: Diagnosis not present

## 2018-07-27 ENCOUNTER — Ambulatory Visit (HOSPITAL_COMMUNITY): Payer: Self-pay | Admitting: Psychiatry

## 2018-09-12 ENCOUNTER — Other Ambulatory Visit: Payer: Self-pay | Admitting: Internal Medicine

## 2018-09-12 DIAGNOSIS — J4520 Mild intermittent asthma, uncomplicated: Secondary | ICD-10-CM

## 2018-09-13 ENCOUNTER — Other Ambulatory Visit: Payer: Self-pay | Admitting: Adult Health

## 2018-09-19 ENCOUNTER — Encounter (HOSPITAL_COMMUNITY): Payer: Self-pay | Admitting: Psychiatry

## 2018-09-19 ENCOUNTER — Ambulatory Visit (INDEPENDENT_AMBULATORY_CARE_PROVIDER_SITE_OTHER): Payer: PPO | Admitting: Psychiatry

## 2018-09-19 VITALS — BP 122/70 | Ht 61.0 in | Wt 120.0 lb

## 2018-09-19 DIAGNOSIS — F411 Generalized anxiety disorder: Secondary | ICD-10-CM

## 2018-09-19 DIAGNOSIS — F331 Major depressive disorder, recurrent, moderate: Secondary | ICD-10-CM

## 2018-09-19 MED ORDER — LAMOTRIGINE 25 MG PO TABS: ORAL_TABLET | ORAL | 1 refills | Status: DC

## 2018-09-19 MED ORDER — AMITRIPTYLINE HCL 25 MG PO TABS: 25.0000 mg | ORAL_TABLET | Freq: Every day | ORAL | 1 refills | Status: DC

## 2018-09-19 NOTE — Progress Notes (Signed)
[facility] MD/PA/NP OP Progress Note  09/19/2018 10:57 AM Sarah Rodriguez  MRN:  [id number]  Chief Complaint: I am doing better.  I am taking my medication.  I do not have panic attacks.  HPI: Patient came for her follow-up appointment.  She was seen by Dr. Wadie Lessen in my absence few months ago but there were no change in the medication.  Patient is stable on low-dose Lamictal and amitriptyline.  She sleeps good.  She also takes Cymbalta and Klonopin prescribed by her primary care physician.  She has chronic issues with the family especially brother but now these things does not bother her.  She does not feel she need a therapist since she can handle these issues very well.  Patient denies any crying spells, irritability, anger, mania or any depression.  Her sleep is good.  She has chronic pain and sometimes tingling for that she takes gabapentin prescribed by other doctor.  She lives with her husband is very supportive.  Patient denies any rash, itching, tremors or shakes.  Her energy level is good.  Patient denies drinking or using any illegal substances.  She denies any major panic attack since the last visit.  Her appetite is okay.  Visit Diagnosis:    ICD-10-CM   1. GAD (generalized anxiety disorder) F41.1   2. MDD (major depressive disorder), recurrent episode, moderate (HCC) F33.1 lamoTRIgine (LAMICTAL) 25 MG tablet    amitriptyline (ELAVIL) 25 MG tablet    Past Psychiatric History: Reviewed Patient had history of depression and has been taking antidepressants since 1996. She had a history of panic attacks while driving. She was seeing Dr. Letta Moynahan and tried Prozac, Paxil, Zoloft, Xanax, Cymbalta and Klonopin. Patient denies any history of psychiatric inpatient treatment or any suicidal attempt.  Past Medical History:  Past Medical History:  Diagnosis Date  . Asthma    Dulera daily  . Chronic back pain    HNP  . Complication of anesthesia    slow to wake up  . Depression    takes Clonazepam nightly  . GERD (gastroesophageal reflux disease)    takes Protonix daily   . Heart murmur   . History of migraine    takes Relpax daily as needed  . HSV (herpes simplex virus) infection   . Hyperlipidemia    takes Atorvastatin daily  . Hypertension    takes Benicar daily  . Hypothyroidism    takes Synthroid daily  . MVP (mitral valve prolapse)   . Pneumonia    hx of--been many yrs ago  . Seasonal allergies    takes Allegra daily  . Shortness of breath    with exertion    Past Surgical History:  Procedure Laterality Date  . ABLATION  2011   Uterine  . buttocks surgery     at age 32 (coccyx repair)  . CERVICAL FUSION  03/2010  . CESAREAN SECTION  1992  . CHOLECYSTECTOMY  2008  . COLONOSCOPY    . DILATION AND CURETTAGE OF UTERUS  2011  . ESOPHAGOGASTRODUODENOSCOPY    . LUMBAR LAMINECTOMY/DECOMPRESSION MICRODISCECTOMY  10/24/2012   Procedure: LUMBAR LAMINECTOMY/DECOMPRESSION MICRODISCECTOMY 1 LEVEL;  Surgeon: Kristeen Miss, MD;  Location: [facility];  Service: Neurosurgery;  Laterality: Right;  Right Lumbar five-Sacral One Microdiskectomy  . right knee arthroscopy     x 3  . TONSILLECTOMY     at age 89    Family Psychiatric History: Reviewed  Family History:  Family History  Problem Relation Age of  Onset  . Arthritis Mother   . Heart attack Mother   . Depression Mother   . Allergies Mother   . Hypertension Mother   . Heart attack Father     Social History:  Social History   Socioeconomic History  . Marital status: Married    Spouse name: Not on file  . Number of children: Not on file  . Years of education: Not on file  . Highest education level: Not on file  Occupational History  . Not on file  Social Needs  . Financial resource strain: Not on file  . Food insecurity:    Worry: Not on file    Inability: Not on file  . Transportation needs:    Medical: Not on file    Non-medical: Not on file  Tobacco Use  . Smoking status: Former  Research scientist (life sciences)  . Smokeless tobacco: Never Used  . Tobacco comment: quit at age 47  Substance and Sexual Activity  . Alcohol use: No    Comment: 2-3 times a yr  . Drug use: No  . Sexual activity: Yes    Birth control/protection: Post-menopausal  Lifestyle  . Physical activity:    Days per week: Not on file    Minutes per session: Not on file  . Stress: Not on file  Relationships  . Social connections:    Talks on phone: Not on file    Gets together: Not on file    Attends religious service: Not on file    Active member of club or organization: Not on file    Attends meetings of clubs or organizations: Not on file    Relationship status: Not on file  Other Topics Concern  . Not on file  Social History Narrative  . Not on file    Allergies:  Allergies  Allergen Reactions  . Demerol [Meperidine] Other (See Comments)    REACTION: unknown--itching  . Epinephrine Other (See Comments)    "Sensitivity."  . Niacin And Related Rash    Metabolic Disorder Labs: Recent Results (from the past 2160 hour(s))  Urinalysis, Routine w reflex microscopic     Status: None   Collection Time: 07/17/18  9:26 AM  Result Value Ref Range   Color, Urine YELLOW YELLOW   APPearance CLEAR CLEAR   Specific Gravity, Urine 1.019 1.001 - 1.03   pH < OR = 5.0 5.0 - 8.0   Glucose, UA NEGATIVE NEGATIVE   Bilirubin Urine NEGATIVE NEGATIVE   Ketones, ur NEGATIVE NEGATIVE   Hgb urine dipstick NEGATIVE NEGATIVE   Protein, ur NEGATIVE NEGATIVE   Nitrite NEGATIVE NEGATIVE   Leukocytes, UA NEGATIVE NEGATIVE  Microalbumin / creatinine urine ratio     Status: None   Collection Time: 07/17/18  9:26 AM  Result Value Ref Range   Creatinine, Urine 152 20 - 275 mg/dL   Microalb, Ur 0.7 mg/dL    Comment: Reference Range Not established    Microalb Creat Ratio 5 <30 mcg/mg creat    Comment: . The ADA defines abnormalities in albumin excretion as follows: Marland Kitchen Category         Result (mcg/mg creatinine) . Normal                     <30 Microalbuminuria         30-299  Clinical albuminuria   > OR = 300 . The ADA recommends that at least two of three specimens collected within a 3-6 month period be abnormal  before considering a patient to be within a diagnostic category.   CBC with Differential/Platelet     Status: None   Collection Time: 07/17/18  9:26 AM  Result Value Ref Range   WBC 7.9 3.8 - 10.8 Thousand/uL   RBC 4.89 3.80 - 5.10 Million/uL   Hemoglobin 14.1 11.7 - 15.5 g/dL   HCT 42.9 35.0 - 45.0 %   MCV 87.7 80.0 - 100.0 fL   MCH 28.8 27.0 - 33.0 pg   MCHC 32.9 32.0 - 36.0 g/dL   RDW 13.1 11.0 - 15.0 %   Platelets 334 140 - 400 Thousand/uL   MPV 10.1 7.5 - 12.5 fL   Neutro Abs 4,985 1,500 - 7,800 cells/uL   Lymphs Abs 2,204 850 - 3,900 cells/uL   WBC mixed population 506 200 - 950 cells/uL   Eosinophils Absolute 166 15 - 500 cells/uL   Basophils Absolute 40 0 - 200 cells/uL   Neutrophils Relative % 63.1 %   Total Lymphocyte 27.9 %   Monocytes Relative 6.4 %   Eosinophils Relative 2.1 %   Basophils Relative 0.5 %  COMPLETE METABOLIC PANEL WITH GFR     Status: Abnormal   Collection Time: 07/17/18  9:26 AM  Result Value Ref Range   Glucose, Bld 137 (H) 65 - 99 mg/dL    Comment: .            Fasting reference interval . For someone without known diabetes, a glucose value >125 mg/dL indicates that they may have diabetes and this should be confirmed with a follow-up test. .    BUN 19 7 - 25 mg/dL   Creat 1.00 (H) 0.50 - 0.99 mg/dL    Comment: For patients >66 years of age, the reference limit for Creatinine is approximately 13% higher for people identified as African-American. .    GFR, Est Non African American 61 > OR = 60 mL/min/1.15m2   GFR, Est African American 70 > OR = 60 mL/min/1.83m2   BUN/Creatinine Ratio 19 6 - 22 (calc)   Sodium 142 135 - 146 mmol/L   Potassium 3.9 3.5 - 5.3 mmol/L   Chloride 102 98 - 110 mmol/L   CO2 27 20 - 32 mmol/L   Calcium 10.2 8.6 -  10.4 mg/dL   Total Protein 7.4 6.1 - 8.1 g/dL   Albumin 5.1 3.6 - 5.1 g/dL   Globulin 2.3 1.9 - 3.7 g/dL (calc)   AG Ratio 2.2 1.0 - 2.5 (calc)   Total Bilirubin 0.5 0.2 - 1.2 mg/dL   Alkaline phosphatase (APISO) 77 33 - 130 U/L   AST 23 10 - 35 U/L   ALT 21 6 - 29 U/L  Magnesium     Status: None   Collection Time: 07/17/18  9:26 AM  Result Value Ref Range   Magnesium 2.1 1.5 - 2.5 mg/dL  Lipid panel     Status: Abnormal   Collection Time: 07/17/18  9:26 AM  Result Value Ref Range   Cholesterol 208 (H) <200 mg/dL   HDL 49 (L) >50 mg/dL   Triglycerides 150 (H) <150 mg/dL   LDL Cholesterol (Calc) 133 (H) mg/dL (calc)    Comment: Reference range: <100 . Desirable range <100 mg/dL for primary prevention;   <70 mg/dL for patients with CHD or diabetic patients  with > or = 2 CHD risk factors. Marland Kitchen LDL-C is now calculated using the Martin-Hopkins  calculation, which is a validated novel method providing  better accuracy than the [employer]  equation in the  estimation of LDL-C.  Cresenciano Genre et al. Annamaria Helling. 4401;027(25): 2061-2068  (http://education.[facility].com/faq/FAQ164)    Total CHOL/HDL Ratio 4.2 <5.0 (calc)   Non-HDL Cholesterol (Calc) 159 (H) <130 mg/dL (calc)    Comment: For patients with diabetes plus 1 major ASCVD risk  factor, treating to a non-HDL-C goal of <100 mg/dL  (LDL-C of <70 mg/dL) is considered a therapeutic  option.   TSH     Status: None   Collection Time: 07/17/18  9:26 AM  Result Value Ref Range   TSH 1.70 0.40 - 4.50 mIU/L  Hemoglobin A1c     Status: Abnormal   Collection Time: 07/17/18  9:26 AM  Result Value Ref Range   Hgb A1c MFr Bld 5.9 (H) <5.7 % of total Hgb    Comment: For someone without known diabetes, a hemoglobin  A1c value between 5.7% and 6.4% is consistent with prediabetes and should be confirmed with a  follow-up test. . For someone with known diabetes, a value <7% indicates that their diabetes is well controlled. A1c targets  should be individualized based on duration of diabetes, age, comorbid conditions, and other considerations. . This assay result is consistent with an increased risk of diabetes. . Currently, no consensus exists regarding use of hemoglobin A1c for diagnosis of diabetes for children. .    Mean Plasma Glucose 123 (calc)   eAG (mmol/L) 6.8 (calc)  Insulin, random     Status: Abnormal   Collection Time: 07/17/18  9:26 AM  Result Value Ref Range   Insulin 43.2 (H) 2.0 - 19.6 uIU/mL    Comment: This insulin assay shows strong cross-reactivity for some insulin analogs (lispro, aspart, and glargine) and much lower cross-reactivity with others (detemir, glulisine).   VITAMIN D 25 Hydroxy (Vit-D Deficiency, Fractures)     Status: None   Collection Time: 07/17/18  9:26 AM  Result Value Ref Range   Vit D, 25-Hydroxy 61 30 - 100 ng/mL    Comment: Vitamin D Status         25-OH Vitamin D: . Deficiency:                    <20 ng/mL Insufficiency:             20 - 29 ng/mL Optimal:                 > or = 30 ng/mL . For 25-OH Vitamin D testing on patients on  D2-supplementation and patients for whom quantitation  of D2 and D3 fractions is required, the Quest[employer]ureD(TM) 25-OH VIT D, (D2,D3), LC/MS/MS is recommended: order  code [phone] (patients >43yrs). . For more information on this test, go to: http://education.[facility].com/faq/FAQ163 (This link is being provided for  informational/educational purposes only.)    Lab Results  Component Value Date   HGBA1C 5.9 (H) 07/17/2018   MPG 123 07/17/2018   MPG 123 03/30/2018   No results found for: PROLACTIN Lab Results  Component Value Date   CHOL 208 (H) 07/17/2018   TRIG 150 (H) 07/17/2018   HDL 49 (L) 07/17/2018   CHOLHDL 4.2 07/17/2018   VLDL 31 (H) 06/06/2017   LDLCALC 133 (H) 07/17/2018   LDLCALC 105 (H) 03/30/2018   Lab Results  Component Value Date   TSH 1.70 07/17/2018   TSH 1.02 03/30/2018    Therapeutic Level  Labs: No results found for: LITHIUM No results found for: VALPROATE No components found for:  CBMZ  Current Medications: Current  Outpatient Medications  Medication Sig Dispense Refill  . ADVAIR HFA 115-21 MCG/ACT inhaler INHALE 2 PUFFS BY MOUTH EVERY 12 HOURS 10 TO 15 MINUTES APART 36 g 0  . amitriptyline (ELAVIL) 25 MG tablet Take 1 tablet (25 mg total) by mouth at bedtime. 90 tablet 1  . Cholecalciferol (VITAMIN D PO) Take 4,000 Units by mouth daily.     Marland Kitchen CINNAMON PO Take 1,000 Units by mouth 2 (two) times daily.     . clonazePAM (KLONOPIN) 0.5 MG tablet Take 1/2 to 1 tablet 2 x / day Only if needed for Anxiety Attack or sleep & please try to limit to 5 days /week to avoid addiction 60 tablet 0  . Cyanocobalamin (VITAMIN B-12) 2500 MCG SUBL Place 1 tablet under the tongue daily.     . diclofenac sodium (VOLTAREN) 1 % GEL Apply 4 g topically 4 (four) times daily. 100 g 3  . DULoxetine (CYMBALTA) 60 MG capsule Take 1 capsule (60 mg total) by mouth daily. 90 capsule 1  . enalapril (VASOTEC) 20 MG tablet TAKE 1 TABLET BY MOUTH DAILY 90 tablet 0  . fexofenadine-pseudoephedrine (ALLEGRA-D 12 HOUR) 60-120 MG per tablet Take 1 tablet by mouth daily.    . fluocinonide cream (LIDEX) 0.05 % Apply to rash 2 x / day as needed 60 g 3  . fluticasone (FLONASE) 50 MCG/ACT nasal spray SHAKE WELL AND USE 2 SPRAYS IN EACH NOSTRIL EVERY EVENING 48 g 2  . gabapentin (NEURONTIN) 100 MG capsule Take 100 mg by mouth 3 (three) times daily.    . halobetasol (ULTRAVATE) 0.05 % ointment   3  . lamoTRIgine (LAMICTAL) 25 MG tablet Take 1 tab daily 90 tablet 1  . levothyroxine (SYNTHROID, LEVOTHROID) 50 MCG tablet TAKE 1/2 TABLET(25 MCG) BY MOUTH DAILY 90 tablet 1  . Magnesium 250 MG TABS Take 3 tablets by mouth daily.     . meloxicam (MOBIC) 15 MG tablet TAKE 1 TABLET(15 MG) BY MOUTH DAILY 90 tablet 3  . montelukast (SINGULAIR) 10 MG tablet TAKE 1 TABLET(10 MG) BY MOUTH AT BEDTIME 90 tablet 3  . nystatin (MYCOSTATIN)  100000 UNIT/ML suspension Take 5 mLs (500,000 Units total) by mouth 4 (four) times daily. 473 mL 1  . pantoprazole (PROTONIX) 40 MG tablet TAKE 1 TABLET(40 MG) BY MOUTH TWICE DAILY 180 tablet 1  . PREMARIN vaginal cream INSERT 1/2 GRAM VAGINALLY VIA APPLICATOR NIGHTLY FOR W EEKS THEN JUST DO TEICE A WEEK  11  . promethazine (PHENERGAN) 25 MG tablet Take 1 tablet (25 mg total) by mouth every 4 (four) hours as needed for nausea or vomiting. 100 tablet 0  . rosuvastatin (CRESTOR) 40 MG tablet Take 1/2 to 1 tablet daily or as directed for Cholesterol 90 tablet 1   No current facility-administered medications for this visit.      Musculoskeletal: Strength & Muscle Tone: within normal limits Gait & Station: normal Patient leans: N/A  Psychiatric Specialty Exam: ROS  Blood pressure 122/70, height 5\' 1"  (1.549 m), weight 120 lb (54.4 kg).Body mass index is 22.67 kg/m.  General Appearance: Casual  Eye Contact:  Good  Speech:  Clear and Coherent  Volume:  Normal  Mood:  Anxious  Affect:  Congruent  Thought Process:  Goal Directed  Orientation:  Full (Time, Place, and Person)  Thought Content: Logical   Suicidal Thoughts:  No  Homicidal Thoughts:  No  Memory:  Immediate;   Good Recent;   Good Remote;   Good  Judgement:  Good  Insight:  Good  Psychomotor Activity:  Normal  Concentration:  Concentration: Good and Attention Span: Good  Recall:  Good  Fund of Knowledge: Good  Language: Good  Akathisia:  No  Handed:  Right  AIMS (if indicated): not done  Assets:  Communication Skills Desire for Improvement Housing  ADL's:  Intact  Cognition: WNL  Sleep:  Good   Screenings: PHQ2-9     Office Visit from 07/17/2018 in [facility] [place] [facility] Visit from 03/30/2018 in [facility] [facility] Visit from 12/21/2017 in [facility] [place] [facility] Visit from 09/14/2017 in [facility] [place] Office Visit from 06/06/2017 in [facility]  PHQ-2 Total Score  0  1  0  0  0       Assessment and Plan: Major depressive disorder, recurrent.  Generalized anxiety disorder.  Patient is a stable on low-dose amitriptyline and Lamictal.  She also takes Klonopin as needed and Cymbalta prescribed by her primary care physician.  Patient does not want to change her medication.  I will continue tripling 25 mg at bedtime and Lamictal 25 mg daily.  Patient does not want to change the dose of Lamictal.  She has no side effects.  I also reviewed blood work results.  Encourage healthy lifestyle and watch her calorie intake.  Recommended to call us back if she has any question or any concern.  Follow-up in 6 months.   Kathlee Nations, MD 09/19/2018, 10:57 AM

## 2018-10-18 NOTE — Progress Notes (Signed)
FOLLOW UP  Assessment and Plan:   Essential hypertension - continue medications, DASH diet, exercise and monitor at home. Call if greater than 130/80.  -     CBC with Differential/Platelet -     COMPLETE METABOLIC PANEL WITH GFR -     TSH -     losartan (COZAAR) 50 MG tablet; Take 1 tablet (50 mg total) by mouth daily.  Hypothyroidism, unspecified type Hypothyroidism-check TSH level, continue medications the same, reminded to take on an empty stomach 30-21mins before food.  -     TSH  Hyperlipidemia, mixed check lipids decrease fatty foods increase activity.  -     Lipid panel  Medication management -     Magnesium  Prediabetes Discussed disease progression and risks Discussed diet/exercise, weight management and risk modification -     Hemoglobin A1c  Depression, major, recurrent, in partial remission (HCC) -     clonazePAM (KLONOPIN) 0.5 MG tablet; Take 1/2 to 1 tablet 2 x / day Only if needed for Anxiety Attack or sleep & please try to limit to 5 days /week to avoid addiction  Needs flu shot -     FLU VACCINE MDCK QUAD W/Preservative  Fatigue, unspecified type No CP, no SOB -     Iron,Total/Total Iron Binding Cap -     Ferritin -     Vitamin B12 -     albuterol (PROAIR HFA) 108 (90 Base) MCG/ACT inhaler; Inhale 2 puffs into the lungs every 6 (six) hours as needed for wheezing or shortness of breath.  Other orders -     Discontinue: albuterol (PROAIR HFA) 108 (90 Base) MCG/ACT inhaler; Inhale 2 puffs into the lungs every 6 (six) hours as needed for wheezing or shortness of breath.     Continue diet and meds as discussed. Further disposition pending results of labs. Over 30 minutes of exam, counseling, chart review, and critical decision making was performed  Future Appointments  Date Time Provider [place]  01/25/2019 10:30 AM Unk Pinto, MD [facility] None  03/21/2019 10:00 AM Arfeen, Arlyce Harman, MD [facility] None  08/03/2019  9:00 AM Unk Pinto,  MD [facility] None     HPI 62 y.o. female  presents for 3 month follow up on hypertension, cholesterol, prediabetes, and vitamin D deficiency.  She has dry cough and is on ACE/enalapril. She is on protonix as well for hoarsness and it is dong better. She has had some fatigue recently as well.   Her blood pressure has been controlled at home, today their BP is BP: 122/80 BMI is Body mass index is 22.27 kg/m., she is working on diet and exercise. Wt Readings from Last 3 Encounters:  10/19/18 119 lb 12.8 oz (54.3 kg)  09/19/18 120 lb (54.4 kg)  07/19/18 120 lb (54.4 kg)    She does not workout. She denies chest pain, shortness of breath, dizziness. She has anxiety and will take klonopin at night and rarely during the day.    She  is  on cholesterol medication, she was switched from lipitor to crestor, she is on 1/2 pill and states that she has been exhausted and denies myalgias. Her cholesterol is not at goal. The cholesterol last visit was:   Lab Results  Component Value Date   CHOL 208 (H) 07/17/2018   HDL 49 (L) 07/17/2018   LDLCALC 133 (H) 07/17/2018   TRIG 150 (H) 07/17/2018   CHOLHDL 4.2 07/17/2018    She has been working on diet and  exercise for prediabetes, and denies paresthesia of the feet, polydipsia, polyuria and visual disturbances. Last A1C in the office was:  Lab Results  Component Value Date   HGBA1C 5.9 (H) 07/17/2018   Patient is on Vitamin D supplement.   Lab Results  Component Value Date   VD25OH 61 07/17/2018        Current Medications:  Current Outpatient Medications on File Prior to Visit  Medication Sig  . ADVAIR HFA 115-21 MCG/ACT inhaler INHALE 2 PUFFS BY MOUTH EVERY 12 HOURS 10 TO 15 MINUTES APART  . amitriptyline (ELAVIL) 25 MG tablet Take 1 tablet (25 mg total) by mouth at bedtime.  . Cholecalciferol (VITAMIN D PO) Take 4,000 Units by mouth daily.   Marland Kitchen CINNAMON PO Take 1,000 Units by mouth 2 (two) times daily.   . Cyanocobalamin (VITAMIN B-12)  2500 MCG SUBL Place 1 tablet under the tongue daily.   . diclofenac sodium (VOLTAREN) 1 % GEL Apply 4 g topically 4 (four) times daily.  . DULoxetine (CYMBALTA) 60 MG capsule Take 1 capsule (60 mg total) by mouth daily.  . fexofenadine-pseudoephedrine (ALLEGRA-D 12 HOUR) 60-120 MG per tablet Take 1 tablet by mouth daily.  . fluocinonide cream (LIDEX) 0.05 % Apply to rash 2 x / day as needed  . fluticasone (FLONASE) 50 MCG/ACT nasal spray SHAKE WELL AND USE 2 SPRAYS IN EACH NOSTRIL EVERY EVENING  . gabapentin (NEURONTIN) 100 MG capsule Take 100 mg by mouth 3 (three) times daily.  . halobetasol (ULTRAVATE) 0.05 % ointment   . lamoTRIgine (LAMICTAL) 25 MG tablet Take 1 tab daily  . levothyroxine (SYNTHROID, LEVOTHROID) 50 MCG tablet TAKE 1/2 TABLET(25 MCG) BY MOUTH DAILY  . Magnesium 250 MG TABS Take 3 tablets by mouth daily.   . meloxicam (MOBIC) 15 MG tablet TAKE 1 TABLET(15 MG) BY MOUTH DAILY  . montelukast (SINGULAIR) 10 MG tablet TAKE 1 TABLET(10 MG) BY MOUTH AT BEDTIME  . nystatin (MYCOSTATIN) 100000 UNIT/ML suspension Take 5 mLs (500,000 Units total) by mouth 4 (four) times daily.  . pantoprazole (PROTONIX) 40 MG tablet TAKE 1 TABLET(40 MG) BY MOUTH TWICE DAILY  . PREMARIN vaginal cream INSERT 1/2 GRAM VAGINALLY VIA APPLICATOR NIGHTLY FOR W EEKS THEN JUST DO TEICE A WEEK  . promethazine (PHENERGAN) 25 MG tablet Take 1 tablet (25 mg total) by mouth every 4 (four) hours as needed for nausea or vomiting.  . rosuvastatin (CRESTOR) 40 MG tablet Take 1/2 to 1 tablet daily or as directed for Cholesterol   No current facility-administered medications on file prior to visit.     Medical History:  Past Medical History:  Diagnosis Date  . Asthma    Dulera daily  . Chronic back pain    HNP  . Complication of anesthesia    slow to wake up  . Depression    takes Clonazepam nightly  . GERD (gastroesophageal reflux disease)    takes Protonix daily   . Heart murmur   . History of migraine     takes Relpax daily as needed  . HSV (herpes simplex virus) infection   . Hyperlipidemia    takes Atorvastatin daily  . Hypertension    takes Benicar daily  . Hypothyroidism    takes Synthroid daily  . MVP (mitral valve prolapse)   . Pneumonia    hx of--been many yrs ago  . Seasonal allergies    takes Allegra daily  . Shortness of breath    with exertion   Allergies:  Allergies  Allergen Reactions  . Demerol [Meperidine] Other (See Comments)    REACTION: unknown--itching  . Epinephrine Other (See Comments)    "Sensitivity."  . Niacin And Related Rash     Review of Systems:  ROS  Family history- Review and unchanged Social history- Review and unchanged Physical Exam: BP 122/80   Pulse 75   Temp 98.5 F (36.9 C)   Ht 5' 1.5" (1.562 m)   Wt 119 lb 12.8 oz (54.3 kg)   SpO2 96%   BMI 22.27 kg/m  Wt Readings from Last 3 Encounters:  10/19/18 119 lb 12.8 oz (54.3 kg)  09/19/18 120 lb (54.4 kg)  07/19/18 120 lb (54.4 kg)   General Appearance: Well nourished, in no apparent distress. Eyes: PERRLA, EOMs, conjunctiva no swelling or erythema Sinuses: No Frontal/maxillary tenderness ENT/Mouth: Ext aud canals clear, TMs without erythema, bulging. No erythema, swelling, or exudate on post pharynx.  Tonsils not swollen or erythematous. Hearing normal.  Neck: Supple, thyroid normal.  Respiratory: Respiratory effort normal, BS equal bilaterally without rales, rhonchi, wheezing or stridor.  Cardio: RRR with no MRGs. Brisk peripheral pulses without edema.  Abdomen: Soft, + BS,  Non tender, no guarding, rebound, hernias, masses. Lymphatics: Non tender without lymphadenopathy.  Musculoskeletal: Full ROM, 5/5 strength, Normal gait Skin: Warm, dry without rashes, lesions, ecchymosis.  Neuro: Cranial nerves intact. Normal muscle tone, no cerebellar symptoms. Psych: Awake and oriented X 3, normal affect, Insight and Judgment appropriate.    Vicie Mutters, PA-C 12:11 PM [facility]  [facility]

## 2018-10-19 ENCOUNTER — Ambulatory Visit (INDEPENDENT_AMBULATORY_CARE_PROVIDER_SITE_OTHER): Payer: PPO | Admitting: Physician Assistant

## 2018-10-19 ENCOUNTER — Encounter: Payer: Self-pay | Admitting: Physician Assistant

## 2018-10-19 VITALS — BP 122/80 | HR 75 | Temp 98.5°F | Ht 61.5 in | Wt 119.8 lb

## 2018-10-19 DIAGNOSIS — Z79899 Other long term (current) drug therapy: Secondary | ICD-10-CM

## 2018-10-19 DIAGNOSIS — E782 Mixed hyperlipidemia: Secondary | ICD-10-CM

## 2018-10-19 DIAGNOSIS — E039 Hypothyroidism, unspecified: Secondary | ICD-10-CM | POA: Diagnosis not present

## 2018-10-19 DIAGNOSIS — R5383 Other fatigue: Secondary | ICD-10-CM | POA: Diagnosis not present

## 2018-10-19 DIAGNOSIS — Z23 Encounter for immunization: Secondary | ICD-10-CM | POA: Diagnosis not present

## 2018-10-19 DIAGNOSIS — R7303 Prediabetes: Secondary | ICD-10-CM | POA: Diagnosis not present

## 2018-10-19 DIAGNOSIS — F3341 Major depressive disorder, recurrent, in partial remission: Secondary | ICD-10-CM

## 2018-10-19 DIAGNOSIS — I10 Essential (primary) hypertension: Secondary | ICD-10-CM | POA: Diagnosis not present

## 2018-10-19 MED ORDER — ALBUTEROL SULFATE HFA 108 (90 BASE) MCG/ACT IN AERS: 2.0000 | INHALATION_SPRAY | Freq: Four times a day (QID) | RESPIRATORY_TRACT | 1 refills | Status: DC | PRN

## 2018-10-19 MED ORDER — LOSARTAN POTASSIUM 50 MG PO TABS: 50.0000 mg | ORAL_TABLET | Freq: Every day | ORAL | 11 refills | Status: DC

## 2018-10-19 MED ORDER — CLONAZEPAM 0.5 MG PO TABS: ORAL_TABLET | ORAL | 0 refills | Status: DC

## 2018-10-19 NOTE — Patient Instructions (Addendum)
STARTING ACE MEDICATION  ACE inhibitors are blood pressure medications that protect your heart and kidneys. It can cause two symptoms: The most common symptom is a dry cough/tickle in your throat that can happen the first day you take it or 5 years after you have been taking it. Please call us if you have this and we can switch it to a different medication that is very similar. Stop enalapril and start losartan, monitor your blood pressure  Stop the crestor x 1 week, then restart crestor take 1/2 every other day or 3 days a week and we will recheck cholesterol next ov 3 months  If you are still tired with this if you labs are normal we will switch you back to lipitor but dicuss adding on vasepea or something for trigs.   Your LDL could improve, ideally we want it under a 100.  Your LDL is the bad cholesterol that can lead to heart attack and stroke. To lower your number you can decrease your fatty foods, red meat, cheese, milk and increase fiber like whole grains and veggies. You can also add a fiber supplement like Citracel or Benefiber, these do not cause gas and bloating and are safe to use. Especially if you have a strong family history of heart disease or stroke or you have evidence of plaque on any imaging like a chest xray, we may discuss at your next office visit putting you on a medication to get your number below 100.   11 Tips to Follow:  1. No caffeine after 3pm: Avoid beverages with caffeine (soda, tea, energy drinks, etc.) especially after 3pm. 2. Don't go to bed hungry: Have your evening meal at least 3 hrs. before going to sleep. It's fine to have a small bedtime snack such as a glass of milk and a few crackers but don't have a big meal. 3. Have a nightly routine before bed: Plan on "winding down" before you go to sleep. Begin relaxing about 1 hour before you go to bed. Try doing a quiet activity such as listening to calming music, reading a book or meditating. 4. Turn off the TV and  ALL electronics including video games, tablets, laptops, etc. 1 hour before sleep, and keep them out of the bedroom. 5. Turn off your cell phone and all notifications (new email and text alerts) or even better, leave your phone outside your room while you sleep. Studies have shown that a part of your brain continues to respond to certain lights and sounds even while you're still asleep. 6. Make your bedroom quiet, dark and cool. If you can't control the noise, try wearing earplugs or using a fan to block out other sounds. 7. Practice relaxation techniques. Try reading a book or meditating or drain your brain by writing a list of what you need to do the next day. 8. Don't nap unless you feel sick: you'll have a better night's sleep. 9. Don't smoke, or quit if you do. Nicotine, alcohol, and marijuana can all keep you awake. Talk to your health care provider if you need help with substance use. 10. Most importantly, wake up at the same time every day (or within 1 hour of your usual wake up time) EVEN on the weekends. A regular wake up time promotes sleep hygiene and prevents sleep problems. 11. Reduce exposure to bright light in the last three hours of the day before going to sleep. Maintaining good sleep hygiene and having good sleep habits lower your risk  of developing sleep problems. Getting better sleep can also improve your concentration and alertness. Try the simple steps in this guide. If you still have trouble getting enough rest, make an appointment with your health care provider.  Fatigue Fatigue is feeling tired all of the time, a lack of energy, or a lack of motivation. Occasional or mild fatigue is often a normal response to activity or life in general. However, long-lasting (chronic) or extreme fatigue may indicate an underlying medical condition. Follow these instructions at home: Watch your fatigue for any changes. The following actions may help to lessen any discomfort you are  feeling:  Talk to your health care provider about how much sleep you need each night. Try to get the required amount every night.  Take medicines only as directed by your health care provider.  Eat a healthy and nutritious diet. Ask your health care provider if you need help changing your diet.  Drink enough fluid to keep your urine clear or pale yellow.  Practice ways of relaxing, such as yoga, meditation, massage therapy, or acupuncture.  Exercise regularly.  Change situations that cause you stress. Try to keep your work and personal routine reasonable.  Do not abuse illegal drugs.  Limit alcohol intake to no more than 1 drink per day for nonpregnant women and 2 drinks per day for men. One drink equals 12 ounces of beer, 5 ounces of wine, or 1 ounces of hard liquor.  Take a multivitamin, if directed by your health care provider.  Contact a health care provider if:  Your fatigue does not get better.  You have a fever.  You have unintentional weight loss or gain.  You have headaches.  You have difficulty: ? Falling asleep. ? Sleeping throughout the night.  You feel angry, guilty, anxious, or sad.  You are unable to have a bowel movement (constipation).  You skin is dry.  Your legs or another part of your body is swollen. Get help right away if:  You feel confused.  Your vision is blurry.  You feel faint or pass out.  You have a severe headache.  You have severe abdominal, pelvic, or back pain.  You have chest pain, shortness of breath, or an irregular or fast heartbeat.  You are unable to urinate or you urinate less than normal.  You develop abnormal bleeding, such as bleeding from the rectum, vagina, nose, lungs, or nipples.  You vomit blood.  You have thoughts about harming yourself or committing suicide.  You are worried that you might harm someone else. This information is not intended to replace advice given to you by your health care provider.  Make sure you discuss any questions you have with your health care provider. Document Released: 09/12/2007 Document Revised: 04/22/2016 Document Reviewed: 03/19/2014 Elsevier Interactive Patient Education  [employer].

## 2018-10-20 DIAGNOSIS — Z1231 Encounter for screening mammogram for malignant neoplasm of breast: Secondary | ICD-10-CM | POA: Diagnosis not present

## 2018-10-20 DIAGNOSIS — Z01419 Encounter for gynecological examination (general) (routine) without abnormal findings: Secondary | ICD-10-CM | POA: Diagnosis not present

## 2018-10-20 LAB — COMPLETE METABOLIC PANEL WITH GFR
AG Ratio: 2.1 (calc) (ref 1.0–2.5)
ALT: 25 U/L (ref 6–29)
AST: 23 U/L (ref 10–35)
Albumin: 4.4 g/dL (ref 3.6–5.1)
Alkaline phosphatase (APISO): 62 U/L (ref 33–130)
BUN: 18 mg/dL (ref 7–25)
CO2: 30 mmol/L (ref 20–32)
Calcium: 9.6 mg/dL (ref 8.6–10.4)
Chloride: 104 mmol/L (ref 98–110)
Creat: 0.91 mg/dL (ref 0.50–0.99)
GFR, Est African American: 79 mL/min/{1.73_m2} (ref >=60)
GFR, Est Non African American: 68 mL/min/{1.73_m2} (ref >=60)
Globulin: 2.1 g/dL (calc) (ref 1.9–3.7)
Glucose, Bld: 86 mg/dL (ref 65–99)
Potassium: 4 mmol/L (ref 3.5–5.3)
Sodium: 142 mmol/L (ref 135–146)
Total Bilirubin: 0.4 mg/dL (ref 0.2–1.2)
Total Protein: 6.5 g/dL (ref 6.1–8.1)

## 2018-10-20 LAB — IRON, TOTAL/TOTAL IRON BINDING CAP
%SAT: 20 % (calc) (ref 16–45)
Iron: 70 ug/dL (ref 45–160)
TIBC: 357 mcg/dL (calc) (ref 250–450)

## 2018-10-20 LAB — CBC WITH DIFFERENTIAL/PLATELET
Basophils Absolute: 31 cells/uL (ref 0–200)
Basophils Relative: 0.5 %
Eosinophils Absolute: 130 cells/uL (ref 15–500)
Eosinophils Relative: 2.1 %
HCT: 37.2 % (ref 35.0–45.0)
Hemoglobin: 12.5 g/dL (ref 11.7–15.5)
Lymphs Abs: 1798 cells/uL (ref 850–3900)
MCH: 29.9 pg (ref 27.0–33.0)
MCHC: 33.6 g/dL (ref 32.0–36.0)
MCV: 89 fL (ref 80.0–100.0)
MPV: 10.1 fL (ref 7.5–12.5)
Monocytes Relative: 6.9 %
Neutro Abs: 3813 cells/uL (ref 1500–7800)
Neutrophils Relative %: 61.5 %
Platelets: 252 10*3/uL (ref 140–400)
RBC: 4.18 10*6/uL (ref 3.80–5.10)
RDW: 12.4 % (ref 11.0–15.0)
Total Lymphocyte: 29 %
WBC mixed population: 428 cells/uL (ref 200–950)
WBC: 6.2 10*3/uL (ref 3.8–10.8)

## 2018-10-20 LAB — VITAMIN B12: Vitamin B-12: >2000 pg/mL — ABNORMAL HIGH (ref 200–1100)

## 2018-10-20 LAB — LIPID PANEL
Cholesterol: 144 mg/dL (ref <200)
HDL: 41 mg/dL — ABNORMAL LOW (ref >50)
LDL Cholesterol (Calc): 79 mg/dL (calc)
Non-HDL Cholesterol (Calc): 103 mg/dL (calc) (ref <130)
Total CHOL/HDL Ratio: 3.5 (calc) (ref <5.0)
Triglycerides: 137 mg/dL (ref <150)

## 2018-10-20 LAB — FERRITIN: Ferritin: 8 ng/mL — ABNORMAL LOW (ref 16–288)

## 2018-10-20 LAB — HEMOGLOBIN A1C
Hgb A1c MFr Bld: 6 % of total Hgb — ABNORMAL HIGH (ref <5.7)
Mean Plasma Glucose: 126 (calc)
eAG (mmol/L): 7 (calc)

## 2018-10-20 LAB — MAGNESIUM: Magnesium: 1.8 mg/dL (ref 1.5–2.5)

## 2018-10-20 LAB — TSH: TSH: 1.34 mIU/L (ref 0.40–4.50)

## 2018-10-20 LAB — HM MAMMOGRAPHY

## 2018-10-30 ENCOUNTER — Encounter: Payer: Self-pay | Admitting: *Deleted

## 2018-10-30 ENCOUNTER — Other Ambulatory Visit: Payer: Self-pay | Admitting: Internal Medicine

## 2018-11-16 ENCOUNTER — Other Ambulatory Visit: Payer: Self-pay | Admitting: Internal Medicine

## 2018-12-11 ENCOUNTER — Other Ambulatory Visit: Payer: Self-pay | Admitting: Internal Medicine

## 2019-01-08 ENCOUNTER — Other Ambulatory Visit: Payer: Self-pay

## 2019-01-08 MED ORDER — LEVOTHYROXINE SODIUM 50 MCG PO TABS: ORAL_TABLET | ORAL | 1 refills | Status: DC

## 2019-01-11 ENCOUNTER — Other Ambulatory Visit: Payer: Self-pay | Admitting: Internal Medicine

## 2019-01-25 ENCOUNTER — Ambulatory Visit: Payer: Self-pay | Admitting: Internal Medicine

## 2019-02-07 ENCOUNTER — Other Ambulatory Visit (HOSPITAL_COMMUNITY): Payer: Self-pay | Admitting: Psychiatry

## 2019-02-07 DIAGNOSIS — F331 Major depressive disorder, recurrent, moderate: Secondary | ICD-10-CM

## 2019-02-20 ENCOUNTER — Other Ambulatory Visit: Payer: Self-pay | Admitting: Internal Medicine

## 2019-02-28 ENCOUNTER — Encounter: Payer: Self-pay | Admitting: Internal Medicine

## 2019-03-01 ENCOUNTER — Encounter: Payer: Self-pay | Admitting: Internal Medicine

## 2019-03-01 ENCOUNTER — Ambulatory Visit: Payer: PPO | Admitting: Internal Medicine

## 2019-03-01 ENCOUNTER — Other Ambulatory Visit: Payer: Self-pay

## 2019-03-01 VITALS — BP 143/85 | HR 72 | Wt 116.0 lb

## 2019-03-01 DIAGNOSIS — J0140 Acute pansinusitis, unspecified: Secondary | ICD-10-CM

## 2019-03-01 DIAGNOSIS — Z79899 Other long term (current) drug therapy: Secondary | ICD-10-CM

## 2019-03-01 DIAGNOSIS — J4521 Mild intermittent asthma with (acute) exacerbation: Secondary | ICD-10-CM | POA: Diagnosis not present

## 2019-03-01 DIAGNOSIS — R7303 Prediabetes: Secondary | ICD-10-CM

## 2019-03-01 DIAGNOSIS — J301 Allergic rhinitis due to pollen: Secondary | ICD-10-CM | POA: Diagnosis not present

## 2019-03-01 DIAGNOSIS — I10 Essential (primary) hypertension: Secondary | ICD-10-CM | POA: Diagnosis not present

## 2019-03-01 DIAGNOSIS — F419 Anxiety disorder, unspecified: Secondary | ICD-10-CM

## 2019-03-01 DIAGNOSIS — M533 Sacrococcygeal disorders, not elsewhere classified: Secondary | ICD-10-CM | POA: Diagnosis not present

## 2019-03-01 DIAGNOSIS — M961 Postlaminectomy syndrome, not elsewhere classified: Secondary | ICD-10-CM | POA: Diagnosis not present

## 2019-03-01 DIAGNOSIS — R7309 Other abnormal glucose: Secondary | ICD-10-CM

## 2019-03-01 DIAGNOSIS — E559 Vitamin D deficiency, unspecified: Secondary | ICD-10-CM | POA: Diagnosis not present

## 2019-03-01 DIAGNOSIS — E782 Mixed hyperlipidemia: Secondary | ICD-10-CM | POA: Diagnosis not present

## 2019-03-01 DIAGNOSIS — F3341 Major depressive disorder, recurrent, in partial remission: Secondary | ICD-10-CM

## 2019-03-01 MED ORDER — CLONAZEPAM 0.5 MG PO TABS: ORAL_TABLET | ORAL | 0 refills | Status: DC

## 2019-03-01 MED ORDER — ROSUVASTATIN CALCIUM 40 MG PO TABS: ORAL_TABLET | ORAL | 1 refills | Status: DC

## 2019-03-01 MED ORDER — PROMETHAZINE-DM 6.25-15 MG/5ML PO SYRP: ORAL_SOLUTION | ORAL | 1 refills | Status: DC

## 2019-03-01 MED ORDER — FLUTICASONE PROPIONATE 50 MCG/ACT NA SUSP: NASAL | 2 refills | Status: DC

## 2019-03-01 MED ORDER — AZITHROMYCIN 250 MG PO TABS: ORAL_TABLET | ORAL | 1 refills | Status: DC

## 2019-03-01 MED ORDER — PREDNISONE 20 MG PO TABS: ORAL_TABLET | ORAL | 0 refills | Status: DC

## 2019-03-01 NOTE — Progress Notes (Signed)
THIS ENCOUNTER IS A VIRTUAL VISIT DUE TO COVID-19 - PATIENT WAS NOT SEEN IN THE OFFICE.   PATIENT HAS CONSENTED TO VIRTUAL VISIT / TELEMEDICINE VISIT  Virtual Visit via telephone Note  I connected with@ on 03/01/19  by telephone.  I verified that I am speaking with the correct person using two identifiers.        I discussed the limitations of evaluation and management by telemedicine and the availability of in person appointments. The patient expressed understanding and agreed to proceed.  History of Present Illness:      Patient is out of town in [place], [place] caring for her father with Dementia in his home for the last 2 weeks. She denies any suspect exposures to covid 19.  She does report head & chest congestion with thick yellow to greenish nasal secretions & sputum. Denies fever, chill, sweats, rash , SOB or chest pains. She also requests refills for her "nerve pills" and g Flonase.      This very nice 63 y.o. MWF  presents for 6 month follow up with HTN, HLD, Pre-Diabetes and Vitamin D Deficiency.       Patient is treated for HTN (2007)  & BP has been controlled at home. Todays BP is slightly elevated at 143/85.  Patient has had no complaints of any cardiac type chest pain, palpitations, dyspnea / orthopnea / PND, dizziness, claudication, or dependent edema.      Patient was initiated on Thyroid replacement in 2009.       Also, the patient has history of PreDiabetes (A1c 5.9% / 2009 & 6.3% / 2014) and has had no symptoms of reactive hypoglycemia, diabetic polys, paresthesias or visual blurring.  Last A1c was not at goal: Lab Results  Component Value Date   HGBA1C 6.0 (H) 10/19/2018       Finally, patient has history of Vitamin D Deficiency ("9"/2009) and last Vitamin D was at goal ("61" in Aug 2019).   Current Outpatient Medications on File Prior to Visit  Medication Sig   ADVAIR HFA 115-21 MCG/ACT inhaler INHALE 2 PUFFS BY MOUTH EVERY 12 HOURS 10 TO 15 MINUTES APART     albuterol (PROAIR HFA) 108 (90 Base) MCG/ACT inhaler Inhale 2 puffs into the lungs every 6 (six) hours as needed for wheezing or shortness of breath.   amitriptyline (ELAVIL) 25 MG tablet Take 1 tablet (25 mg total) by mouth at bedtime.   Cholecalciferol (VITAMIN D PO) Take 4,000 Units by mouth daily.    CINNAMON PO Take 1,000 Units by mouth 2 (two) times daily.    clonazePAM (KLONOPIN) 0.5 MG tablet Take 1/2 to 1 tablet 2 x / day Only if needed for Anxiety Attack or sleep & please try to limit to 5 days /week to avoid addiction   Cyanocobalamin (VITAMIN B-12) 2500 MCG SUBL Place 1 tablet under the tongue daily.    diclofenac sodium (VOLTAREN) 1 % GEL Apply 4 g topically 4 (four) times daily.   DULoxetine (CYMBALTA) 60 MG capsule TAKE 1 CAPSULE(60 MG) BY MOUTH DAILY   fexofenadine-pseudoephedrine (ALLEGRA-D 12 HOUR) 60-120 MG per tablet Take 1 tablet by mouth daily.   fluocinonide cream (LIDEX) 0.05 % Apply to rash 2 x / day as needed   fluticasone (FLONASE) 50 MCG/ACT nasal spray INSTILL 2 SPRAYS IN EACH NOSTRIL EVERY EVENING   gabapentin (NEURONTIN) 100 MG capsule Take 100 mg by mouth 3 (three) times daily.   halobetasol (ULTRAVATE) 0.05 % ointment  lamoTRIgine (LAMICTAL) 25 MG tablet Take 1 tab daily   levothyroxine (SYNTHROID, LEVOTHROID) 50 MCG tablet TAKE 1/2 TABLET(25 MCG) BY MOUTH DAILY   losartan (COZAAR) 50 MG tablet Take 1 tablet (50 mg total) by mouth daily.   Magnesium 250 MG TABS Take 3 tablets by mouth daily.    meloxicam (MOBIC) 15 MG tablet TAKE 1 TABLET(15 MG) BY MOUTH DAILY   montelukast (SINGULAIR) 10 MG tablet TAKE 1 TABLET(10 MG) BY MOUTH AT BEDTIME   nystatin (MYCOSTATIN) 100000 UNIT/ML suspension Take 5 mLs (500,000 Units total) by mouth 4 (four) times daily.   pantoprazole (PROTONIX) 40 MG tablet TAKE 1 TABLET(40 MG) BY MOUTH TWICE DAILY   PREMARIN vaginal cream INSERT 1/2 GRAM VAGINALLY VIA APPLICATOR NIGHTLY FOR W EEKS THEN JUST DO TEICE A  WEEK   promethazine (PHENERGAN) 25 MG tablet Take 1 tablet (25 mg total) by mouth every 4 (four) hours as needed for nausea or vomiting.   rosuvastatin (CRESTOR) 40 MG tablet Take 1/2 to 1 tablet daily or as directed for Cholesterol   No current facility-administered medications on file prior to visit.    Allergies  Allergen Reactions   Demerol [Meperidine] Other (See Comments)    REACTION: unknown--itching   Epinephrine Other (See Comments)    "Sensitivity."   Niacin And Related Rash   PMHx:   Past Medical History:  Diagnosis Date   Asthma    Dulera daily   Chronic back pain    HNP   Complication of anesthesia    slow to wake up   Depression    takes Clonazepam nightly   GERD (gastroesophageal reflux disease)    takes Protonix daily    Heart murmur    History of migraine    takes Relpax daily as needed   HSV (herpes simplex virus) infection    Hyperlipidemia    takes Atorvastatin daily   Hypertension    takes Benicar daily   Hypothyroidism    takes Synthroid daily   MVP (mitral valve prolapse)    Pneumonia    hx of--been many yrs ago   Seasonal allergies    takes Allegra daily   Shortness of breath    with exertion   Immunization History  Administered Date(s) Administered   Influenza Inj Mdck Quad With Preservative 09/14/2017, 10/19/2018   Influenza,inj,quad, With Preservative 11/09/2016   Influenza-Unspecified 09/18/2013   PPD Test 04/17/2014   Pneumococcal-Unspecified 11/30/1995   Td 08/27/2005, 05/05/2016   Past Surgical History:  Procedure Laterality Date   ABLATION  2011   Uterine   buttocks surgery     at age 68 (coccyx repair)   CERVICAL FUSION  03/2010   CESAREAN SECTION  1992   CHOLECYSTECTOMY  2008   COLONOSCOPY     DILATION AND CURETTAGE OF UTERUS  2011   ESOPHAGOGASTRODUODENOSCOPY     LUMBAR LAMINECTOMY/DECOMPRESSION MICRODISCECTOMY  10/24/2012   Procedure: LUMBAR LAMINECTOMY/DECOMPRESSION  MICRODISCECTOMY 1 LEVEL;  Surgeon: Kristeen Miss, MD;  Location: [facility];  Service: Neurosurgery;  Laterality: Right;  Right Lumbar five-Sacral One Microdiskectomy   right knee arthroscopy     x 3   TONSILLECTOMY     at age 81   FHx:    Reviewed / unchanged  SHx:    Reviewed / unchanged   Systems Review:  Eyes: Denies redness, blurred vision, diplopia, discharge, itchy, watery eyes.   ENT: Denies discharge, congestion, post nasal drip, epistaxis, sore throat, earache, hearing loss, dental pain, tinnitus, vertigo, sinus pain,  snoring.   Respiratory: denies cough, dyspnea, DOE, pleurisy, hoarseness, laryngitis, wheezing.    Physical Exam  BP (!) 143/85    Pulse 72    Wt 116 lb (52.6 kg)    BMI 21.56 kg/m    General   HEENT: + hoarseness, wheezy congested cough. No stridor. Lungs: speaks in complete sentences, + audible wheezing, no apparent distress Neurological: alert, oriented x 3 Psychiatric: pleasant, judgement appropriate   Assessment and Plan:  1. Essential hypertension   2. Hyperlipidemia, mixed  - rosuvastatin (CRESTOR) 40 MG tablet; Take 1/2 to 1 tablet daily or as directed for Cholesterol  Dispense: 90 tablet; Refill: 1  3. Abnormal glucose  4. Vitamin D deficiency  5. Prediabetes  6. Depression, major, recurrent, in partial remission (HCC)  - clonazePAM (KLONOPIN) 0.5 MG tablet; Take 1/2 to 1 tablet 2 x / day Only if needed for Anxiety Attack or sleep & please try to limit to 5 days /week to avoid addiction  Dispense: 60 tablet; Refill: 0  7. Chronic anxiety  - clonazePAM (KLONOPIN) 0.5 MG tablet; Take 1/2 to 1 tablet 2 x / day Only if needed for Anxiety Attack or sleep & please try to limit to 5 days /week to avoid addiction  Dispense: 60 tablet; Refill: 0  8. Subacute pansinusitis  - predniSONE (DELTASONE) 20 MG tablet; 1 tab 3 x day for 3 days, then 1 tab 2 x day for 3 days, then 1 tab 1 x day for 5 days  Dispense: 20 tablet; Refill: 0 -  azithromycin (ZITHROMAX) 250 MG tablet; Take 2 tablets (500 mg) on  Day 1,  followed by 1 tablet (250 mg) once daily on Days 2 through 5 for sinusitis / asthmatic bronchitis  Dispense: 6 each; Refill: 1 - promethazine-dextromethorphan (PROMETHAZINE-DM) 6.25-15 MG/5ML syrup; Take 1 to 2 tsp enery 4 hours if needed for cough  Dispense: 360 mL; Refill: 1  9. Mild intermittent asthmatic bronchitis with acute exacerbation  - predniSONE (DELTASONE) 20 MG tablet; 1 tab 3 x day for 3 days, then 1 tab 2 x day for 3 days, then 1 tab 1 x day for 5 days  Dispense: 20 tablet; Refill: 0  - azithromycin (ZITHROMAX) 250 MG tablet; Take 2 tablets (500 mg) on  Day 1,  followed by 1 tablet (250 mg) once daily on Days 2 through 5 for sinusitis / asthmatic bronchitis  Dispense: 6 each; Refill: 1 - promethazine-dextromethorphan (PROMETHAZINE-DM) 6.25-15 MG/5ML syrup; Take 1 to 2 tsp enery 4 hours if needed for cough  Dispense: 360 mL; Refill: 1  10. History allergic rhinitis due to pollen  - fluticasone (FLONASE) 50 MCG/ACT nasal spray; INSTILL 2 SPRAYS IN EACH NOSTRIL EVERY EVENING  Dispense: 48 g; Refill: 2  11. Medication management  - reviewed med effects / side-effects w/patient       I discussed the assessment and treatment plan with the patient. The patient was provided an opportunity to ask questions and all were answered. The patient agreed with the plan and demonstrated an understanding of the instructions.        The patient was advised to call back or seek an in-person evaluation if the symptoms worsen or if the condition fails to improve as anticipated.       I provided  26 minutes of non-face-to-face time during this encounter and over 40 minutes of virtual exam,  counseling, chart review and  complex critical decision making was performed  Kirtland Bouchard, MD

## 2019-03-01 NOTE — Patient Instructions (Signed)

## 2019-03-08 ENCOUNTER — Other Ambulatory Visit: Payer: Self-pay | Admitting: Internal Medicine

## 2019-03-09 ENCOUNTER — Other Ambulatory Visit: Payer: PPO | Admitting: Internal Medicine

## 2019-03-09 DIAGNOSIS — Z85820 Personal history of malignant melanoma of skin: Secondary | ICD-10-CM | POA: Diagnosis not present

## 2019-03-09 DIAGNOSIS — B370 Candidal stomatitis: Secondary | ICD-10-CM

## 2019-03-09 DIAGNOSIS — L578 Other skin changes due to chronic exposure to nonionizing radiation: Secondary | ICD-10-CM | POA: Diagnosis not present

## 2019-03-09 DIAGNOSIS — Z85828 Personal history of other malignant neoplasm of skin: Secondary | ICD-10-CM | POA: Diagnosis not present

## 2019-03-09 DIAGNOSIS — D1801 Hemangioma of skin and subcutaneous tissue: Secondary | ICD-10-CM | POA: Diagnosis not present

## 2019-03-09 DIAGNOSIS — L570 Actinic keratosis: Secondary | ICD-10-CM | POA: Diagnosis not present

## 2019-03-09 DIAGNOSIS — L821 Other seborrheic keratosis: Secondary | ICD-10-CM | POA: Diagnosis not present

## 2019-03-09 DIAGNOSIS — L812 Freckles: Secondary | ICD-10-CM | POA: Diagnosis not present

## 2019-03-09 MED ORDER — FLUCONAZOLE 150 MG PO TABS: ORAL_TABLET | ORAL | 3 refills | Status: DC

## 2019-03-09 NOTE — Progress Notes (Signed)
THIS ENCOUNTER IS A VIRTUAL VISIT DUE TO COVID-19 - PATIENT WAS NOT SEEN IN THE OFFICE.  PATIENT HAS CONSENTED TO VIRTUAL VISIT / TELEMEDICINE VISIT   Virtual Visit via telephone Note  I connected with Sarah Rodriguez on 03/09/2019 by telephone.  I verified that I am speaking with the correct person using two identifiers.    I discussed the limitations of evaluation and management by telemedicine and the availability of in person appointments. The patient expressed understanding and agreed to proceed.  History of Present Illness:   The patient was treated on 04/02 with a Z-pak / Prednisone for a respiratory infection. She has hx/o oral thrush in the past associated with using her Advair. She has developed sx's again not responding to diluted vinegar gargles. She describes white patches in the back of her throat and p "pain" & discomfort further down the back of her throat about the level of the larynx or "voicebox". She denies fever , chills , PND, or reflux sx's.   Medications  Current Outpatient Medications (Endocrine & Metabolic):  .  levothyroxine (SYNTHROID, LEVOTHROID) 50 MCG tablet, TAKE 1/2 TABLET(25 MCG) BY MOUTH DAILY .  predniSONE (DELTASONE) 20 MG tablet, 1 tab 3 x day for 3 days, then 1 tab 2 x day for 3 days, then 1 tab 1 x day for 5 days  Current Outpatient Medications (Cardiovascular):  .  losartan (COZAAR) 50 MG tablet, Take 1 tablet (50 mg total) by mouth daily. .  rosuvastatin (CRESTOR) 40 MG tablet, Take 1/2 to 1 tablet daily or as directed for Cholesterol  Current Outpatient Medications (Respiratory):  Marland Kitchen  ADVAIR HFA 115-21 MCG/ACT inhaler, INHALE 2 PUFFS BY MOUTH EVERY 12 HOURS 10 TO 15 MINUTES APART .  albuterol (PROAIR HFA) 108 (90 Base) MCG/ACT inhaler, Inhale 2 puffs into the lungs every 6 (six) hours as needed for wheezing or shortness of breath. .  fexofenadine-pseudoephedrine (ALLEGRA-D 12 HOUR) 60-120 MG per tablet, Take 1 tablet by mouth daily. .  fluticasone  (FLONASE) 50 MCG/ACT nasal spray, INSTILL 2 SPRAYS IN EACH NOSTRIL EVERY EVENING .  montelukast (SINGULAIR) 10 MG tablet, TAKE 1 TABLET(10 MG) BY MOUTH AT BEDTIME .  promethazine (PHENERGAN) 25 MG tablet, Take 1 tablet (25 mg total) by mouth every 4 (four) hours as needed for nausea or vomiting. .  promethazine-dextromethorphan (PROMETHAZINE-DM) 6.25-15 MG/5ML syrup, Take 1 to 2 tsp enery 4 hours if needed for cough  Current Outpatient Medications (Analgesics):  .  meloxicam (MOBIC) 15 MG tablet, TAKE 1 TABLET(15 MG) BY MOUTH DAILY  Current Outpatient Medications (Hematological):  Marland Kitchen  Cyanocobalamin (VITAMIN B-12) 2500 MCG SUBL, Place 1 tablet under the tongue daily.   Current Outpatient Medications (Other):  .  amitriptyline (ELAVIL) 25 MG tablet, Take 1 tablet (25 mg total) by mouth at bedtime. Marland Kitchen  azithromycin (ZITHROMAX) 250 MG tablet, Take 2 tablets (500 mg) on  Day 1,  followed by 1 tablet (250 mg) once daily on Days 2 through 5 for sinusitis / asthmatic bronchitis .  Cholecalciferol (VITAMIN D PO), Take 4,000 Units by mouth daily.  Marland Kitchen  CINNAMON PO, Take 1,000 Units by mouth 2 (two) times daily.  .  clonazePAM (KLONOPIN) 0.5 MG tablet, Take 1/2 to 1 tablet 2 x / day Only if needed for Anxiety Attack or sleep & please try to limit to 5 days /week to avoid addiction .  diclofenac sodium (VOLTAREN) 1 % GEL, Apply 4 g topically 4 (four) times daily. .  DULoxetine (  CYMBALTA) 60 MG capsule, TAKE 1 CAPSULE(60 MG) BY MOUTH DAILY .  fluocinonide cream (LIDEX) 0.05 %, Apply to rash 2 x / day as needed .  gabapentin (NEURONTIN) 100 MG capsule, Take 100 mg by mouth 3 (three) times daily. .  halobetasol (ULTRAVATE) 0.05 % ointment,  .  lamoTRIgine (LAMICTAL) 25 MG tablet, Take 1 tab daily .  Magnesium 250 MG TABS, Take 3 tablets by mouth daily.  Marland Kitchen  nystatin (MYCOSTATIN) 100000 UNIT/ML suspension, Take 5 mLs (500,000 Units total) by mouth 4 (four) times daily. .  pantoprazole (PROTONIX) 40 MG tablet,  TAKE 1 TABLET(40 MG) BY MOUTH TWICE DAILY .  PREMARIN vaginal cream, INSERT 1/2 GRAM VAGINALLY VIA APPLICATOR NIGHTLY FOR W EEKS THEN JUST DO TEICE A WEEK  Problem list She has Hyperlipidemia, mixed; Hypertension; Asthma; GERD (gastroesophageal reflux disease); Hypothyroidism; Vitamin D deficiency; Medication management; Prediabetes; Spondylolisthesis at L4-L5 level; Encounter for Medicare annual wellness exam; Migraine; Depression, major, recurrent, in partial remission ([place]); Degenerative joint disease; and Chronic back pain on their problem list.   Observations/Objective: General Appearance: Well nourished well developed, in no apparent distress.  Eyes: conjunctiva no swelling or erythema ENT/Mouth: No hoarseness, No cough for duration of visit.  Neck: Supple  Respiratory: Respiratory effort normal, normal rate, no retractions or distress.   Cardio: Appears well-perfused, noncyanotic Musculoskeletal: no obvious deformity Skin: visible skin without rashes, ecchymosis, erythema Neuro: Awake and oriented X 3,  Psych:  normal affect, Insight and Judgment appropriate.   Assessment and Plan:  1. Oral pharyngeal candidiasis  - fluconazole (DIFLUCAN) 150 MG tablet; Take 1 tablet 2 x  /week as needed for yeast infection  Dispense: 8 tablet; Refill: 3  Follow Up Instructions:  - Instructed in diluted vinegar /water gargles Laretta Alstrom     I discussed the assessment and treatment plan with the patient. The patient was provided an opportunity to ask questions and all were answered. The patient agreed with the plan and demonstrated an understanding of the instructions.      The patient was advised to call back or seek an in-person evaluation if the symptoms worsen or if the condition fails to improve as anticipated.  I provided  28 minutes of non-face-to-face time during this encounter.  Kirtland Bouchard, MD

## 2019-03-21 ENCOUNTER — Other Ambulatory Visit: Payer: Self-pay

## 2019-03-21 ENCOUNTER — Encounter (HOSPITAL_COMMUNITY): Payer: Self-pay | Admitting: Psychiatry

## 2019-03-21 ENCOUNTER — Ambulatory Visit (INDEPENDENT_AMBULATORY_CARE_PROVIDER_SITE_OTHER): Payer: PPO | Admitting: Psychiatry

## 2019-03-21 DIAGNOSIS — F331 Major depressive disorder, recurrent, moderate: Secondary | ICD-10-CM | POA: Diagnosis not present

## 2019-03-21 DIAGNOSIS — F411 Generalized anxiety disorder: Secondary | ICD-10-CM

## 2019-03-21 MED ORDER — AMITRIPTYLINE HCL 25 MG PO TABS: 25.0000 mg | ORAL_TABLET | Freq: Every day | ORAL | 1 refills | Status: DC

## 2019-03-21 MED ORDER — LAMOTRIGINE 25 MG PO TABS: ORAL_TABLET | ORAL | 1 refills | Status: DC

## 2019-03-21 NOTE — Progress Notes (Signed)
Virtual Visit via Telephone Note  I connected with Sarah Rodriguez on 03/21/19 at 10:00 AM EDT by telephone and verified that I am speaking with the correct person using two identifiers.   I discussed the limitations, risks, security and privacy concerns of performing an evaluation and management service by telephone and the availability of in person appointments. I also discussed with the patient that there may be a patient responsible charge related to this service. The patient expressed understanding and agreed to proceed.   History of Present Illness: Patient was evaluated through phone conversation.  She is staying with her father in [place], [place].  Patient told her father require 24 7 help and in January she came to visit her and then end up staying here.  She is treating responsibility as a caretaker with other people.  Now due to coronavirus she cannot go back to [place].  She also like to stay with the father until she get permanent solution.  She is taking the medication as prescribed.  She feels the amitriptyline and lamotrigine helping her mood, sleep and anxiety.  She also takes Cymbalta and clonazepam from her primary care physician.  Recently she had a episode of bronchitis and given steroids and she is scared to go outside.  She denies any panic attack.  She denies any crying spells or any feeling of hopelessness or worthlessness.  She reported no tremors, shakes, rash or any itching.  She reported her appetite is okay.  She is scared to go outside to walk because of allergies but trying to watch her calorie intake.  She wanted to have her prescription called in to [facility].   Past Psychiatric History: Reviewed H/O depression and taking antidepressants since 1996. H/O panic attacks while driving. Saw Dr. Letta Moynahan and tried Prozac, Paxil, Zoloft, Xanax, Cymbalta and Klonopin. No h/o inpatient treatment or any suicidal  attempt.  Observations/Objective: Mental status examination done on the phone.  Patient describes her mood okay.  Her speech is slow but clear coherent with normal tone and volume.  She denies any auditory or visual hallucination.  She denies any active or passive suicidal thoughts or homicidal thought.  Her attention and concentration is okay.  Her thought process logical.  She is alert and oriented x3.  There were no delusions, paranoia.  Her fund of knowledge is adequate.  Her cognition is intact.  Her insight judgment is okay.  Assessment and Plan: Major depressive disorder, recurrent.  Generalized anxiety disorder.  Patient is a stable on current medication.  She does not want to increase the dose since current medicine working very well.  I will continue amitriptyline 25 mg at bedtime and Lamictal 25 mg daily.  She has no rash or itching.  She is getting Cymbalta and Klonopin from primary care physician Dr. Vicente Serene.  Recommended to call us back if she has any question, concern or if she feels worsening of the symptoms.  Follow-up in 6 months.  Follow Up Instructions:    I discussed the assessment and treatment plan with the patient. The patient was provided an opportunity to ask questions and all were answered. The patient agreed with the plan and demonstrated an understanding of the instructions.   The patient was advised to call back or seek an in-person evaluation if the symptoms worsen or if the condition fails to improve as anticipated.  I provided 20 minutes of non-face-to-face time during this encounter.   Kathlee Nations, MD

## 2019-04-25 ENCOUNTER — Other Ambulatory Visit: Payer: Self-pay | Admitting: Adult Health

## 2019-04-25 DIAGNOSIS — J4520 Mild intermittent asthma, uncomplicated: Secondary | ICD-10-CM

## 2019-04-27 DIAGNOSIS — H35013 Changes in retinal vascular appearance, bilateral: Secondary | ICD-10-CM | POA: Diagnosis not present

## 2019-04-27 DIAGNOSIS — H25013 Cortical age-related cataract, bilateral: Secondary | ICD-10-CM | POA: Diagnosis not present

## 2019-04-27 DIAGNOSIS — H2513 Age-related nuclear cataract, bilateral: Secondary | ICD-10-CM | POA: Diagnosis not present

## 2019-04-27 DIAGNOSIS — H43393 Other vitreous opacities, bilateral: Secondary | ICD-10-CM | POA: Diagnosis not present

## 2019-05-04 DIAGNOSIS — L821 Other seborrheic keratosis: Secondary | ICD-10-CM | POA: Diagnosis not present

## 2019-05-04 DIAGNOSIS — L218 Other seborrheic dermatitis: Secondary | ICD-10-CM | POA: Diagnosis not present

## 2019-05-04 DIAGNOSIS — L570 Actinic keratosis: Secondary | ICD-10-CM | POA: Diagnosis not present

## 2019-05-04 DIAGNOSIS — X32XXXD Exposure to sunlight, subsequent encounter: Secondary | ICD-10-CM | POA: Diagnosis not present

## 2019-05-14 ENCOUNTER — Other Ambulatory Visit: Payer: Self-pay | Admitting: Internal Medicine

## 2019-05-30 NOTE — Progress Notes (Signed)
MEDICARE ANNUAL WELLNESS VISIT AND FOLLOW UP    Encounter for Medicare annual wellness exam  Migraine with aura and without status migrainosus, not intractable Well managed by PRN OTC analgesic and phenergan Given decadron for possible sinus migraine Given sample in office of [facility] patient to go to ER if there is weakness, thunderclap headache, visual changes, or any concerning factors  Essential hypertension Continue medication Monitor blood pressure at home; call if consistently over 130/80 Continue DASH diet.   Reminder to go to the ER if any CP, SOB, nausea, dizziness, severe HA, changes vision/speech, left arm numbness and tingling and jaw pain.  Uncomplicated asthma, unspecified asthma severity, unspecified whether persistent Well controlled on daily inhalers without recent flair  Gastroesophageal reflux disease, esophagitis presence not specified Well managed on current medications Discussed diet, avoiding triggers and other lifestyle changes  Hypothyroidism, unspecified typ continue medications the same pending lab results reminded to take on an empty stomach 30-1mins before food.  check TSH level  Spondylolisthesis at L4-L5 level S/p fusion for repair Followed by Dr. Osborne Oman  Primary osteoarthritis involving multiple joints Well managed by motrin/tylenol  Vitamin D deficiency At goal at recent check; continue to recommend supplementation Defer vitamin D level  Prediabetes Discussed disease and risks Discussed diet/exercise, weight management  A1C,  Medication management CBC, CMP/GFR  Hyperlipidemia, mixed Continue medications: atorvastatin Continue low cholesterol diet and exercise.  Check lipid panel.   Depression, major, recurrent, in partial remission ([place]) Followed by Dr. Adele Schilder Continue medications, reminded to limit benzo use to <5 days a week if possible to avoid dependence  Lifestyle discussed: diet/exerise, sleep hygiene, stress  management, hydration  Chronic back pain, unspecified back location, unspecified back pain laterality Continue follow up with Dr. Ellene Route Takes gabapentin for back pain  BMI 22.0-22.9, adult Continue to recommend diet heavy in fruits and veggies and low in animal meats, cheeses, and dairy products, appropriate calorie intake Discuss exercise recommendations routinely Continue to monitor weight at each visit    Over 40 minutes of exam, counseling, chart review and critical decision making was performed Future Appointments  Date Time Provider [place]  06/20/2019 11:00 AM Arfeen, Arlyce Harman, MD [facility] None  09/03/2019 11:00 AM Unk Pinto, MD [facility] None     Plan:   During the course of the visit the patient was educated and counseled about appropriate screening and preventive services including:    Pneumococcal vaccine   Prevnar 13  Influenza vaccine  Td vaccine  Screening electrocardiogram  Bone densitometry screening  Colorectal cancer screening  Diabetes screening  Glaucoma screening  Nutrition counseling   Advanced directives: requested   Subjective:  Sarah Rodriguez is a 63 y.o. female who presents for Medicare Annual Wellness Visit and 3 month follow up. Patient has GERD controlled on her meds.   She also has Chronic Pain Syndrome consequent of Cx DDD and Cx HNP surg in 2011 and then in 2013, she underwent L5/S1 Surg by Dr Ellene Route and lastly in Nov 2016, she had a Lumbar fusion. She is able to control her pain on Gabapentin and has avoided Opioids.  She has had headache x Tuesday, took OTC, phenergan and relpax that did not help much. She has history of asthma/allergies, states consistently has cough. She has had some sinus congestion/left side worse.    she has a diagnosis of depression currently in remission on lamictal and amitryptiline, and currently takes klonopin 0.25-0.5 mg BID PRN anxiety and insomnia, reports symptoms are well controlled  on current regimen. she takes 0.125 mg of klonopin at night for sleep, uses rarely during the day for anxiety.   BMI is Body mass index is 22.6 kg/m., she has been working on diet and exercise. Wt Readings from Last 3 Encounters:  05/31/19 121 lb 9.6 oz (55.2 kg)  03/01/19 116 lb (52.6 kg)  10/19/18 119 lb 12.8 oz (54.3 kg)   . Her blood pressure has been controlled at home, today their BP is BP: 132/78 She does workout. She denies chest pain, shortness of breath, dizziness.   She is on cholesterol medication (atorvastatin 10 mg daily) and denies myalgias. Her cholesterol is not at goal. The cholesterol last visit was:   Lab Results  Component Value Date   CHOL 144 10/19/2018   HDL 41 (L) 10/19/2018   LDLCALC 79 10/19/2018   TRIG 137 10/19/2018   CHOLHDL 3.5 10/19/2018    She has been working on diet and exercise for prediabetes, and denies foot ulcerations, increased appetite, nausea, polydipsia, polyuria, visual disturbances, vomiting and weight loss. Last A1C in the office was:  Lab Results  Component Value Date   HGBA1C 6.0 (H) 10/19/2018   She is on thyroid medication. Her medication was not changed last visit.   Lab Results  Component Value Date   TSH 1.34 10/19/2018   Last GFR: Lab Results  Component Value Date   GFRNONAA 68 10/19/2018   Patient is on Vitamin D supplement.   Lab Results  Component Value Date   VD25OH 61 07/17/2018      Medication Review: Current Outpatient Medications on File Prior to Visit  Medication Sig Dispense Refill  . ADVAIR HFA 115-21 MCG/ACT inhaler INHALE 2 PUFFS BY MOUTH EVERY 12 HOURS 10 TO 15 MINUTES APART 36 g 0  . albuterol (PROAIR HFA) 108 (90 Base) MCG/ACT inhaler Inhale 2 puffs into the lungs every 6 (six) hours as needed for wheezing or shortness of breath. 8 g 1  . amitriptyline (ELAVIL) 25 MG tablet Take 1 tablet (25 mg total) by mouth at bedtime. 90 tablet 1  . Cholecalciferol (VITAMIN D PO) Take 4,000 Units by mouth daily.      Marland Kitchen CINNAMON PO Take 1,000 Units by mouth 2 (two) times daily.     . clonazePAM (KLONOPIN) 0.5 MG tablet Take 1/2 to 1 tablet 2 x / day Only if needed for Anxiety Attack or sleep & please try to limit to 5 days /week to avoid addiction 60 tablet 0  . Cyanocobalamin (VITAMIN B-12) 2500 MCG SUBL Place 1 tablet under the tongue daily.     . diclofenac sodium (VOLTAREN) 1 % GEL Apply 4 g topically 4 (four) times daily. 100 g 3  . DULoxetine (CYMBALTA) 60 MG capsule TAKE 1 CAPSULE(60 MG) BY MOUTH DAILY 90 capsule 1  . fexofenadine-pseudoephedrine (ALLEGRA-D 12 HOUR) 60-120 MG per tablet Take 1 tablet by mouth daily.    . fluocinonide cream (LIDEX) 0.05 % Apply to rash 2 x / day as needed 60 g 3  . fluticasone (FLONASE) 50 MCG/ACT nasal spray INSTILL 2 SPRAYS IN EACH NOSTRIL EVERY EVENING 48 g 2  . gabapentin (NEURONTIN) 100 MG capsule Take 100 mg by mouth 3 (three) times daily.    . halobetasol (ULTRAVATE) 0.05 % ointment   3  . lamoTRIgine (LAMICTAL) 25 MG tablet Take 1 tab daily 90 tablet 1  . levothyroxine (SYNTHROID, LEVOTHROID) 50 MCG tablet TAKE 1/2 TABLET(25 MCG) BY MOUTH DAILY 90 tablet 1  .  losartan (COZAAR) 50 MG tablet Take 1 tablet (50 mg total) by mouth daily. 30 tablet 11  . Magnesium 250 MG TABS Take 3 tablets by mouth daily.     . meloxicam (MOBIC) 15 MG tablet TAKE 1 TABLET(15 MG) BY MOUTH DAILY 90 tablet 3  . montelukast (SINGULAIR) 10 MG tablet TAKE 1 TABLET(10 MG) BY MOUTH AT BEDTIME 90 tablet 3  . nystatin (MYCOSTATIN) 100000 UNIT/ML suspension Take 5 mLs (500,000 Units total) by mouth 4 (four) times daily. 473 mL 1  . pantoprazole (PROTONIX) 40 MG tablet TAKE 1 TABLET(40 MG) BY MOUTH TWICE DAILY 180 tablet 0  . PREMARIN vaginal cream INSERT 1/2 GRAM VAGINALLY VIA APPLICATOR NIGHTLY FOR W EEKS THEN JUST DO TEICE A WEEK  11  . rosuvastatin (CRESTOR) 40 MG tablet Take 1/2 to 1 tablet daily or as directed for Cholesterol 90 tablet 1  . fluconazole (DIFLUCAN) 150 MG tablet Take 1  tablet 2 x /week as needed for yeast infection (Patient not taking: Reported on 05/31/2019) 8 tablet 3  . promethazine (PHENERGAN) 25 MG tablet Take 1 tablet (25 mg total) by mouth every 4 (four) hours as needed for nausea or vomiting. (Patient not taking: Reported on 05/31/2019) 100 tablet 0  . promethazine-dextromethorphan (PROMETHAZINE-DM) 6.25-15 MG/5ML syrup Take 1 to 2 tsp enery 4 hours if needed for cough (Patient not taking: Reported on 05/31/2019) 360 mL 1   No current facility-administered medications on file prior to visit.     Allergies  Allergen Reactions  . Demerol [Meperidine] Other (See Comments)    REACTION: unknown--itching  . Epinephrine Other (See Comments)    "Sensitivity."  . Niacin And Related Rash    Current Problems (verified) Patient Active Problem List   Diagnosis Date Noted  . Chronic back pain 12/21/2017  . Depression, major, recurrent, in partial remission ([place]) 08/05/2016  . Degenerative joint disease 08/05/2016  . Migraine 03/03/2016  . Encounter for Medicare annual wellness exam 02/16/2016  . Spondylolisthesis at L4-L5 level 10/27/2015  . Prediabetes 12/12/2014  . Vitamin D deficiency 04/17/2014  . Medication management 04/17/2014  . Hyperlipidemia, mixed   . Hypertension   . Asthma   . GERD (gastroesophageal reflux disease)   . Hypothyroidism     Screening Tests Immunization History  Administered Date(s) Administered  . Influenza Inj Mdck Quad With Preservative 09/14/2017, 10/19/2018  . Influenza,inj,quad, With Preservative 11/09/2016  . Influenza-Unspecified 09/18/2013  . PPD Test 04/17/2014  . Pneumococcal-Unspecified 11/30/1995  . Td 08/27/2005, 05/05/2016   Preventative care: Last colonoscopy: 2014 Dr. Cristina Gong  normal Last mammogram: 09/2018 at Dr. Valentino Saxon Last pap smear/pelvic exam: 2017 at [place]: declines Stress test 2006 EGD 2014, Dr. Cristina Gong  Prior vaccinations: TD or Tdap: 2017 Influenza: 2019 Pneumococcal:  1997 Prevnar13: age 61 Shingles/Zostavax: declines  Names of Other Physician/Practitioners you currently use: 1. [facility]- here for primary care 2. Dr. Georgina Snell, eye doctor, last visit 2018, wears glasses.  3. Dr. Carlye Grippe, dentist, last visit 2019  Patient Care Team: Unk Pinto, MD as PCP - General (Internal Medicine)  SURGICAL HISTORY She  has a past surgical history that includes Dilation and curettage of uterus (2011); Ablation (2011); Cholecystectomy (2008); Tonsillectomy; buttocks surgery; Cesarean section (1992); right knee arthroscopy; Cervical fusion (03/2010); Colonoscopy; Esophagogastroduodenoscopy; and Lumbar laminectomy/decompression microdiscectomy (10/24/2012). FAMILY HISTORY Her family history includes Allergies in her mother; Arthritis in her mother; Depression in her mother; Heart attack in her father and mother; Hypertension in her mother. SOCIAL  HISTORY She  reports that she has quit smoking. She has never used smokeless tobacco. She reports that she does not drink alcohol or use drugs.  MEDICARE WELLNESS OBJECTIVES: Physical activity: Current Exercise Habits: Home exercise routine, Time (Minutes): 30, Frequency (Times/Week): 7, Weekly Exercise (Minutes/Week): 210 Cardiac risk factors: Cardiac Risk Factors include: advanced age (>7men, >38 women);diabetes mellitus;dyslipidemia;hypertension Depression/mood screen:   Depression screen [facility] 2/9 05/31/2019  Decreased Interest 0  Down, Depressed, Hopeless 0  PHQ - 2 Score 0    ADLs:  In your present state of health, do you have any difficulty performing the following activities: 05/31/2019 03/01/2019  Hearing? N N  Vision? N N  Difficulty concentrating or making decisions? N N  Walking or climbing stairs? N N  Dressing or bathing? N N  Doing errands, shopping? N N  Some recent data might be hidden     Cognitive Testing  Alert? Yes  Normal Appearance?Yes  Oriented to  person? Yes  Place? Yes   Time? Yes  Recall of three objects?  Yes  Can perform simple calculations? Yes  Displays appropriate judgment?Yes  Can read the correct time from a watch face?Yes  EOL planning: Does Patient Have a Medical Advance Directive?: Yes Type of Advance Directive: Healthcare Power of Attorney, Living will [place] in Chart?: No - copy requested  Review of Systems  Constitutional: Negative for malaise/fatigue and weight loss.  HENT: Negative for hearing loss and tinnitus.   Eyes: Negative for blurred vision and double vision.  Respiratory: Negative for cough, sputum production, shortness of breath and wheezing.   Cardiovascular: Negative for chest pain, palpitations, orthopnea, claudication, leg swelling and PND.  Gastrointestinal: Negative for abdominal pain, blood in stool, constipation, diarrhea, heartburn, melena, nausea and vomiting.  Genitourinary: Negative.   Musculoskeletal: Positive for back pain and joint pain. Negative for falls and myalgias.  Skin: Negative for rash.  Neurological: Negative for dizziness, tingling, sensory change, weakness and headaches.  Endo/Heme/Allergies: Negative for polydipsia.  Psychiatric/Behavioral: Negative.  Negative for depression, memory loss, substance abuse and suicidal ideas. The patient is not nervous/anxious and does not have insomnia.   All other systems reviewed and are negative.    Objective:     Today's Vitals   05/31/19 1153  BP: 132/78  Pulse: 81  Temp: 97.9 F (36.6 C)  SpO2: 97%  Weight: 121 lb 9.6 oz (55.2 kg)  Height: 5' 1.5" (1.562 m)  PainSc: 7    Body mass index is 22.6 kg/m.  General appearance: alert, no distress, WD/WN, female HEENT: normocephalic, sclerae anicteric, TMs pearly, nares patent, no discharge or erythema, pharynx normal Oral cavity: MMM, no lesions Neck: supple, no lymphadenopathy, no thyromegaly, no masses Heart: RRR, normal S1, S2, no  murmurs Lungs: CTA bilaterally, no wheezes, rhonchi, or rales Abdomen: +bs, soft, non tender, non distended, no masses, no hepatomegaly, no splenomegaly Musculoskeletal: nontender, no swelling, no obvious deformity Extremities: no edema, no cyanosis, no clubbing Pulses: 2+ symmetric, upper and lower extremities, normal cap refill Neurological: alert, oriented x 3, CN2-12 intact, strength normal upper extremities and lower extremities, sensation normal throughout, DTRs 2+ throughout, no cerebellar signs, gait normal Psychiatric: normal affect, behavior normal, pleasant   Medicare Attestation I have personally reviewed: The patient's medical and social history Their use of alcohol, tobacco or illicit drugs Their current medications and supplements The patient's functional ability including ADLs,fall risks, home safety risks, cognitive, and hearing and visual impairment Diet and physical activities Evidence  for depression or mood disorders  The patient's weight, height, BMI, and visual acuity have been recorded in the chart.  I have made referrals, counseling, and provided education to the patient based on review of the above and I have provided the patient with a written personalized care plan for preventive services.     Vicie Mutters, PA-C   05/31/2019

## 2019-05-31 ENCOUNTER — Encounter: Payer: Self-pay | Admitting: Physician Assistant

## 2019-05-31 ENCOUNTER — Ambulatory Visit (INDEPENDENT_AMBULATORY_CARE_PROVIDER_SITE_OTHER): Payer: PPO | Admitting: Physician Assistant

## 2019-05-31 ENCOUNTER — Other Ambulatory Visit: Payer: Self-pay

## 2019-05-31 VITALS — BP 132/78 | HR 81 | Temp 97.9°F | Ht 61.5 in | Wt 121.6 lb

## 2019-05-31 DIAGNOSIS — Z0001 Encounter for general adult medical examination with abnormal findings: Secondary | ICD-10-CM | POA: Diagnosis not present

## 2019-05-31 DIAGNOSIS — K219 Gastro-esophageal reflux disease without esophagitis: Secondary | ICD-10-CM | POA: Diagnosis not present

## 2019-05-31 DIAGNOSIS — J45909 Unspecified asthma, uncomplicated: Secondary | ICD-10-CM

## 2019-05-31 DIAGNOSIS — F3341 Major depressive disorder, recurrent, in partial remission: Secondary | ICD-10-CM

## 2019-05-31 DIAGNOSIS — R6889 Other general symptoms and signs: Secondary | ICD-10-CM

## 2019-05-31 DIAGNOSIS — Z79899 Other long term (current) drug therapy: Secondary | ICD-10-CM

## 2019-05-31 DIAGNOSIS — E782 Mixed hyperlipidemia: Secondary | ICD-10-CM | POA: Diagnosis not present

## 2019-05-31 DIAGNOSIS — G43109 Migraine with aura, not intractable, without status migrainosus: Secondary | ICD-10-CM

## 2019-05-31 DIAGNOSIS — I10 Essential (primary) hypertension: Secondary | ICD-10-CM

## 2019-05-31 DIAGNOSIS — E039 Hypothyroidism, unspecified: Secondary | ICD-10-CM

## 2019-05-31 DIAGNOSIS — R7309 Other abnormal glucose: Secondary | ICD-10-CM

## 2019-05-31 DIAGNOSIS — M4316 Spondylolisthesis, lumbar region: Secondary | ICD-10-CM | POA: Diagnosis not present

## 2019-05-31 DIAGNOSIS — E559 Vitamin D deficiency, unspecified: Secondary | ICD-10-CM

## 2019-05-31 DIAGNOSIS — Z0000 Encounter for general adult medical examination without abnormal findings: Secondary | ICD-10-CM

## 2019-05-31 MED ORDER — DEXAMETHASONE 0.5 MG PO TABS: ORAL_TABLET | ORAL | 0 refills | Status: DC

## 2019-05-31 MED ORDER — NURTEC 75 MG PO TBDP: 75.0000 mg | ORAL_TABLET | ORAL | 0 refills | Status: DC | PRN

## 2019-05-31 NOTE — Patient Instructions (Signed)
GENERAL HEALTH GOALS  Know what a healthy weight is for you (roughly BMI <25) and aim to maintain this  Aim for 7+ servings of fruits and vegetables daily  70-80+ fluid ounces of water or unsweet tea for healthy kidneys  Limit to max 1 drink of alcohol per day; avoid smoking/tobacco  Limit animal fats in diet for cholesterol and heart health - choose grass fed whenever available  Avoid highly processed foods, and foods high in saturated/trans fats  Aim for low stress - take time to unwind and care for your mental health  Aim for 150 min of moderate intensity exercise weekly for heart health, and weights twice weekly for bone health  Aim for 7-9 hours of sleep daily   1.  Limit use of pain relievers to no more than 2 days out of week to prevent risk of rebound or medication-overuse headache. 2.  Keep headache diary 3.  Exercise, hydration, caffeine cessation, sleep hygiene, monitor for and avoid triggers 4.  Consider:  magnesium citrate 400mg  daily, riboflavin 400mg  daily, and coenzyme Q10 100mg  three times daily   We may also treat TMJ if we think you have it If you are having frequent migraines we may put you on a once a day medication with fast acting medication to take. Also there is such a thing called rebound headache from over use of acute medications.  Please do not use rescue or acute medications more than 10 days a month or more than 3 days per week, this can cause a withdrawal and a rebound headache.  Here is more information below  Please remember, common headache triggers are: sleep deprivation, dehydration, overheating, stress, hypoglycemia or skipping meals and blood sugar fluctuations, excessive pain medications or excessive alcohol use or caffeine withdrawal. Some people have food triggers such as aged cheese, orange juice or chocolate, especially dark chocolate, or MSG (monosodium glutamate). Try to avoid these headache triggers as much possible.   It may be  helpful to keep a headache diary to figure out what makes your headaches worse or brings them on and what alleviates them. Some people report headache onset after exercise but studies have shown that regular exercise may actually prevent headaches from coming. If you have exercise-induced headaches, please make sure that you drink plenty of fluid before and after exercising and that you do not over do it and do not overheat.   Please go to the ER if there is weakness, thunderclap headache, visual changes, or any concerning factors    Migraine Headache A migraine headache is an intense, throbbing pain on one or both sides of your head. Recurrent migraines keep coming back. A migraine can last for 30 minutes to several hours. CAUSES  The exact cause of a migraine headache is not always known. However, a migraine may be caused when nerves in the brain become irritated and release chemicals that cause inflammation. This causes pain. Certain things may also trigger migraines, such as:   Alcohol.  Smoking.  Stress.  Menstruation.  Aged cheeses.  Foods or drinks that contain nitrates, glutamate, aspartame, or tyramine.  Lack of sleep.  Chocolate.  Caffeine.  Hunger.  Physical exertion.  Fatigue.  Medicines used to treat chest pain (nitroglycerine), birth control pills, estrogen, and some blood pressure medicines. SYMPTOMS   Pain on one or both sides of your head.  Pulsating or throbbing pain.  Severe pain that prevents daily activities.  Pain that is aggravated by any physical activity.  Nausea,  vomiting, or both.  Dizziness.  Pain with exposure to bright lights, loud noises, or activity.  General sensitivity to bright lights, loud noises, or smells. Before you get a migraine, you may get warning signs that a migraine is coming (aura). An aura may include:  Seeing flashing lights.  Seeing bright spots, halos, or zigzag lines.  Having tunnel vision or blurred  vision.  Having feelings of numbness or tingling.  Having trouble talking.  Having muscle weakness. DIAGNOSIS  A recurrent migraine headache is often diagnosed based on:  Symptoms.  Physical examination.  A CT scan or MRI of your head. These imaging tests cannot diagnose migraines but can help rule out other causes of headaches.   TREATMENT  Medicines may be given for pain and nausea. Medicines can also be given to help prevent recurrent migraines. HOME CARE INSTRUCTIONS  Only take over-the-counter or prescription medicines for pain or discomfort as directed by your health care provider. The use of long-term narcotics is not recommended.  Lie down in a dark, quiet room when you have a migraine.  Keep a journal to find out what may trigger your migraine headaches. For example, write down:  What you eat and drink.  How much sleep you get.  Any change to your diet or medicines.  Limit alcohol consumption.  Quit smoking if you smoke.  Get 7-9 hours of sleep, or as recommended by your health care provider.  Limit stress.  Keep lights dim if bright lights bother you and make your migraines worse. SEEK MEDICAL CARE IF:   You do not get relief from the medicines given to you.  You have a recurrence of pain.  You have a fever. SEEK IMMEDIATE MEDICAL CARE IF:  Your migraine becomes severe.  You have a stiff neck.  You have loss of vision.  You have muscular weakness or loss of muscle control.  You start losing your balance or have trouble walking.  You feel faint or pass out.  You have severe symptoms that are different from your first symptoms. MAKE SURE YOU:   Understand these instructions.  Will watch your condition.  Will get help right away if you are not doing well or get worse.   This information is not intended to replace advice given to you by your health care provider. Make sure you discuss any questions you have with your health care provider.     Document Released: 08/10/2001 Document Revised: 12/06/2014 Document Reviewed: 07/23/2013 Elsevier Interactive Patient Education 2016 [employer].  Common Migraine Triggers   Foods Aged cheese, alcohol, nuts, chocolate, yogurt, onions, figs, liver, caffeinated foods and beverages, monosodium glutamate (MSG), smoked or pickled fish/meat, nitrate/nitrate preserved foods (hotdogs, pepperoni, salami) tyramine  Medications Antibiotics (tetracycline, griseofulvin), antihypertensives (nifedipine, captopril), hormones (oral contraceptives, estrogens), histamine-2 blockers (cimetidine, raniidine, vasodilators (nitroglycerine, isosorbide dinitrate)  Sensory Stimuli Flickering/bright/fluorescent lights, bright sunlight, odors (perfume, chemicals, cigarette smoke)  Lifestyle Changes Time zones, sleep patterns, eating habits, caffeine withdrawal stress  Other Menstrual cycle, weather/season/air pressure changes, high altitude  Adapted from [place] and [place], [place]. [facility]; [place] and [place]. Con[facility]ering Headache, 1998  Hormonal variations also are believed to play a part.  Fluctuations of the female hormone estrogen (such as just before menstruation) affect a chemical called serotonin-when serotonin levels in the brain fall, the dilation (expansion) of blood vessels in the brain that is characteristic of migraine often follows.  Many factors or "triggers" can start a migraine.  In people who get migraines,  most experts think certain activities or foods may trigger temporary changes in the blood vessels around the brain.  Swelling of these blood vessels may cause pain in the nearby nerves.  Allergy Headaches:  Hotdogs Milk  Onions  Thyme Bacon  Chocolate Garlic  Nutmeg Ham  Dark Cola Pork  Cinnamon Salami  Nuts  Egg  Ginger Sausage Red wine Cloves  Cheddar Cheese Caffeine

## 2019-06-01 DIAGNOSIS — H524 Presbyopia: Secondary | ICD-10-CM | POA: Diagnosis not present

## 2019-06-01 DIAGNOSIS — H04123 Dry eye syndrome of bilateral lacrimal glands: Secondary | ICD-10-CM | POA: Diagnosis not present

## 2019-06-01 LAB — COMPLETE METABOLIC PANEL WITH GFR
AG Ratio: 2 (calc) (ref 1.0–2.5)
ALT: 19 U/L (ref 6–29)
AST: 19 U/L (ref 10–35)
Albumin: 4.3 g/dL (ref 3.6–5.1)
Alkaline phosphatase (APISO): 65 U/L (ref 37–153)
BUN: 15 mg/dL (ref 7–25)
CO2: 27 mmol/L (ref 20–32)
Calcium: 9.2 mg/dL (ref 8.6–10.4)
Chloride: 104 mmol/L (ref 98–110)
Creat: 0.87 mg/dL (ref 0.50–0.99)
GFR, Est African American: 83 mL/min/{1.73_m2} (ref >=60)
GFR, Est Non African American: 71 mL/min/{1.73_m2} (ref >=60)
Globulin: 2.1 g/dL (calc) (ref 1.9–3.7)
Glucose, Bld: 125 mg/dL — ABNORMAL HIGH (ref 65–99)
Potassium: 3.7 mmol/L (ref 3.5–5.3)
Sodium: 141 mmol/L (ref 135–146)
Total Bilirubin: 0.3 mg/dL (ref 0.2–1.2)
Total Protein: 6.4 g/dL (ref 6.1–8.1)

## 2019-06-01 LAB — LIPID PANEL
Cholesterol: 155 mg/dL (ref <200)
HDL: 44 mg/dL — ABNORMAL LOW (ref >=50)
LDL Cholesterol (Calc): 80 mg/dL (calc)
Non-HDL Cholesterol (Calc): 111 mg/dL (calc) (ref <130)
Total CHOL/HDL Ratio: 3.5 (calc) (ref <5.0)
Triglycerides: 212 mg/dL — ABNORMAL HIGH (ref <150)

## 2019-06-01 LAB — CBC WITH DIFFERENTIAL/PLATELET
Absolute Monocytes: 342 cells/uL (ref 200–950)
Basophils Absolute: 29 cells/uL (ref 0–200)
Basophils Relative: 0.5 %
Eosinophils Absolute: 220 cells/uL (ref 15–500)
Eosinophils Relative: 3.8 %
HCT: 40.2 % (ref 35.0–45.0)
Hemoglobin: 13.2 g/dL (ref 11.7–15.5)
Lymphs Abs: 1833 cells/uL (ref 850–3900)
MCH: 29.6 pg (ref 27.0–33.0)
MCHC: 32.8 g/dL (ref 32.0–36.0)
MCV: 90.1 fL (ref 80.0–100.0)
MPV: 9.8 fL (ref 7.5–12.5)
Monocytes Relative: 5.9 %
Neutro Abs: 3376 cells/uL (ref 1500–7800)
Neutrophils Relative %: 58.2 %
Platelets: 259 10*3/uL (ref 140–400)
RBC: 4.46 10*6/uL (ref 3.80–5.10)
RDW: 12.9 % (ref 11.0–15.0)
Total Lymphocyte: 31.6 %
WBC: 5.8 10*3/uL (ref 3.8–10.8)

## 2019-06-01 LAB — HEMOGLOBIN A1C
Hgb A1c MFr Bld: 5.8 % of total Hgb — ABNORMAL HIGH (ref <5.7)
Mean Plasma Glucose: 120 (calc)
eAG (mmol/L): 6.6 (calc)

## 2019-06-01 LAB — VITAMIN D 25 HYDROXY (VIT D DEFICIENCY, FRACTURES): Vit D, 25-Hydroxy: 61 ng/mL (ref 30–100)

## 2019-06-01 LAB — TSH: TSH: 0.98 mIU/L (ref 0.40–4.50)

## 2019-06-01 LAB — MAGNESIUM: Magnesium: 1.8 mg/dL (ref 1.5–2.5)

## 2019-06-20 ENCOUNTER — Other Ambulatory Visit: Payer: Self-pay

## 2019-06-20 ENCOUNTER — Encounter (HOSPITAL_COMMUNITY): Payer: Self-pay | Admitting: Psychiatry

## 2019-06-20 ENCOUNTER — Ambulatory Visit (INDEPENDENT_AMBULATORY_CARE_PROVIDER_SITE_OTHER): Payer: PPO | Admitting: Psychiatry

## 2019-06-20 DIAGNOSIS — F331 Major depressive disorder, recurrent, moderate: Secondary | ICD-10-CM

## 2019-06-20 DIAGNOSIS — F4321 Adjustment disorder with depressed mood: Secondary | ICD-10-CM

## 2019-06-20 DIAGNOSIS — F411 Generalized anxiety disorder: Secondary | ICD-10-CM

## 2019-06-20 MED ORDER — AMITRIPTYLINE HCL 50 MG PO TABS: 50.0000 mg | ORAL_TABLET | Freq: Every day | ORAL | 0 refills | Status: DC

## 2019-06-20 MED ORDER — LAMOTRIGINE 25 MG PO TABS: ORAL_TABLET | ORAL | 0 refills | Status: DC

## 2019-06-20 NOTE — Progress Notes (Signed)
Virtual Visit via Telephone Note  I connected with Sarah Rodriguez on 06/20/19 at 11:00 AM EDT by telephone and verified that I am speaking with the correct person using two identifiers.   I discussed the limitations, risks, security and privacy concerns of performing an evaluation and management service by telephone and the availability of in person appointments. I also discussed with the patient that there may be a patient responsible charge related to this service. The patient expressed understanding and agreed to proceed.   History of Present Illness: Patient was evaluated through phone session.  She has been very sad depressed and anxious.  She did not sleep very well for past few weeks.  Her father died on 04/22/2023 and after that she became more depressed when find out that her brother took over her father's belongings and business.  Patient was not aware that few years ago her brother convinced the father to sign all his real state property to his name.  Even though she got her father's home but her brother came and took all the valuables from the home.  Patient is now clean the trash from her father's home so she can sell it.  She is also under a lot of stress because her daughter is getting married in 3 weeks.  Her husband is very busy because of the tax season.  She admitted sometimes crying spells, feeling very nervous and anxious but denies any major panic attack.  She is getting Klonopin and Cymbalta from her primary care physician.  She takes 0.25 mg Klonopin and 30 mg of Cymbalta.  She is reluctant to increase the dose however after some discussion she agreed to try higher dose of amitriptyline which is given by [place].  She is only sleeping few hours.  She ruminates a lot about losing the father and also very disturbed from her brother.  She has a good support system.  She denies any suicidal thoughts or homicidal thought.  Her energy level is low.  Her appetite is fair.  Recently she saw her  primary care physician and she has blood work.  Her hemoglobin A1c is 5.8.  She denies drinking or using any illegal substances.   Past Psychiatric History:Reviewed H/O depression and taking antidepressants since 1996. H/O panic attacks while driving. Saw Dr. Letta Moynahan and tried Prozac, Paxil, Zoloft, Xanax, Cymbalta and Klonopin. No h/o inpatient treatment or any suicidal attempt.   Recent Results (from the past 2160 hour(s))  CBC with Differential/Platelet     Status: None   Collection Time: 05/31/19 12:22 PM  Result Value Ref Range   WBC 5.8 3.8 - 10.8 Thousand/uL   RBC 4.46 3.80 - 5.10 Million/uL   Hemoglobin 13.2 11.7 - 15.5 g/dL   HCT 40.2 35.0 - 45.0 %   MCV 90.1 80.0 - 100.0 fL   MCH 29.6 27.0 - 33.0 pg   MCHC 32.8 32.0 - 36.0 g/dL   RDW 12.9 11.0 - 15.0 %   Platelets 259 140 - 400 Thousand/uL   MPV 9.8 7.5 - 12.5 fL   Neutro Abs 3,376 1,500 - 7,800 cells/uL   Lymphs Abs 1,833 850 - 3,900 cells/uL   Absolute Monocytes 342 200 - 950 cells/uL   Eosinophils Absolute 220 15 - 500 cells/uL   Basophils Absolute 29 0 - 200 cells/uL   Neutrophils Relative % 58.2 %   Total Lymphocyte 31.6 %   Monocytes Relative 5.9 %   Eosinophils Relative 3.8 %   Basophils  Relative 0.5 %  COMPLETE METABOLIC PANEL WITH GFR     Status: Abnormal   Collection Time: 05/31/19 12:22 PM  Result Value Ref Range   Glucose, Bld 125 (H) 65 - 99 mg/dL    Comment: .            Fasting reference interval . For someone without known diabetes, a glucose value between 100 and 125 mg/dL is consistent with prediabetes and should be confirmed with a follow-up test. .    BUN 15 7 - 25 mg/dL   Creat 0.87 0.50 - 0.99 mg/dL    Comment: For patients >17 years of age, the reference limit for Creatinine is approximately 13% higher for people identified as African-American. .    GFR, Est Non African American 71 > OR = 60 mL/min/1.78m2   GFR, Est African American 83 > OR = 60 mL/min/1.13m2    BUN/Creatinine Ratio NOT APPLICABLE 6 - 22 (calc)   Sodium 141 135 - 146 mmol/L   Potassium 3.7 3.5 - 5.3 mmol/L   Chloride 104 98 - 110 mmol/L   CO2 27 20 - 32 mmol/L   Calcium 9.2 8.6 - 10.4 mg/dL   Total Protein 6.4 6.1 - 8.1 g/dL   Albumin 4.3 3.6 - 5.1 g/dL   Globulin 2.1 1.9 - 3.7 g/dL (calc)   AG Ratio 2.0 1.0 - 2.5 (calc)   Total Bilirubin 0.3 0.2 - 1.2 mg/dL   Alkaline phosphatase (APISO) 65 37 - 153 U/L   AST 19 10 - 35 U/L   ALT 19 6 - 29 U/L  TSH     Status: None   Collection Time: 05/31/19 12:22 PM  Result Value Ref Range   TSH 0.98 0.40 - 4.50 mIU/L  Lipid panel     Status: Abnormal   Collection Time: 05/31/19 12:22 PM  Result Value Ref Range   Cholesterol 155 <200 mg/dL   HDL 44 (L) > OR = 50 mg/dL   Triglycerides 212 (H) <150 mg/dL    Comment: . If a non-fasting specimen was collected, consider repeat triglyceride testing on a fasting specimen if clinically indicated.  Yates Decamp et al. J. of Clin. Lipidol. [id number]:[id number]. Marland Kitchen    LDL Cholesterol (Calc) 80 mg/dL (calc)    Comment: Reference range: <100 . Desirable range <100 mg/dL for primary prevention;   <70 mg/dL for patients with CHD or diabetic patients  with > or = 2 CHD risk factors. Marland Kitchen LDL-C is now calculated using the Martin-Hopkins  calculation, which is a validated novel method providing  better accuracy than the Friedewald equation in the  estimation of LDL-C.  Cresenciano Genre et al. Annamaria Helling. 4696;295(28): 2061-2068  (http://education.[facility].com/faq/FAQ164)    Total CHOL/HDL Ratio 3.5 <5.0 (calc)   Non-HDL Cholesterol (Calc) 111 <130 mg/dL (calc)    Comment: For patients with diabetes plus 1 major ASCVD risk  factor, treating to a non-HDL-C goal of <100 mg/dL  (LDL-C of <70 mg/dL) is considered a therapeutic  option.   Hemoglobin A1c     Status: Abnormal   Collection Time: 05/31/19 12:22 PM  Result Value Ref Range   Hgb A1c MFr Bld 5.8 (H) <5.7 % of total Hgb    Comment: For someone  without known diabetes, a hemoglobin  A1c value between 5.7% and 6.4% is consistent with prediabetes and should be confirmed with a  follow-up test. . For someone with known diabetes, a value <7% indicates that their diabetes is well controlled. A1c targets should be individualized  based on duration of diabetes, age, comorbid conditions, and other considerations. . This assay result is consistent with an increased risk of diabetes. . Currently, no consensus exists regarding use of hemoglobin A1c for diagnosis of diabetes for children. .    Mean Plasma Glucose 120 (calc)   eAG (mmol/L) 6.6 (calc)  Magnesium     Status: None   Collection Time: 05/31/19 12:22 PM  Result Value Ref Range   Magnesium 1.8 1.5 - 2.5 mg/dL  VITAMIN D 25 Hydroxy (Vit-D Deficiency, Fractures)     Status: None   Collection Time: 05/31/19 12:22 PM  Result Value Ref Range   Vit D, 25-Hydroxy 61 30 - 100 ng/mL    Comment: Vitamin D Status         25-OH Vitamin D: . Deficiency:                    <20 ng/mL Insufficiency:             20 - 29 ng/mL Optimal:                 > or = 30 ng/mL . For 25-OH Vitamin D testing on patients on  D2-supplementation and patients for whom quantitation  of D2 and D3 fractions is required, the [facility](TM) 25-OH VIT D, (D2,D3), LC/MS/MS is recommended: order  code [id number] (patients >14yrs). See Note 1 . Note 1 . For additional information, please refer to  http://education.[facility].com/faq/FAQ199  (This link is being provided for informational/ educational purposes only.)     Psychiatric Specialty Exam: Physical Exam  ROS  There were no vitals taken for this visit.There is no height or weight on file to calculate BMI.  General Appearance: NA  Eye Contact:  NA  Speech:  Clear and Coherent and Slow  Volume:  Normal  Mood:  Anxious and Dysphoric  Affect:  NA  Thought Process:  Goal Directed  Orientation:  Full (Time, Place, and Person)  Thought  Content:  Rumination  Suicidal Thoughts:  No  Homicidal Thoughts:  No  Memory:  Immediate;   Good Recent;   Good Remote;   Good  Judgement:  Good  Insight:  Good  Psychomotor Activity:  NA  Concentration:  Concentration: Good and Attention Span: Good  Recall:  Good  Fund of Knowledge:  Good  Language:  Good  Akathisia:  No  Handed:  Right  AIMS (if indicated):     Assets:  Communication Skills Desire for Improvement Housing Resilience Social Support  ADL's:  Intact  Cognition:  WNL  Sleep:   poor      Assessment and Plan: Major depressive disorder, recurrent.  Generalized anxiety disorder.  Grief.  Patient is experiencing grief and her anxiety is increased since the loss of her father who was sick and she was taking care of him.  She also very upset on her brother who took all his father's business and his name.  Recommend grief counseling and therapy but patient reported that she had a good support system.  We also discussed lab results and current medication dose.  She is taking low-dose clonazepam and Cymbalta but she does not want to increase the dose.  She agreed to try higher dose of amitriptyline which can help her anxiety and sleep.  I will increase amitriptyline 50 mg at bedtime.  She like to continue current dose of Lamictal 25 mg daily.  She has no rash or any itching.  She is getting Cymbalta 25  mg and Klonopin 0.5 mg which she takes half pills prescribed by her primary care physician.  I discussed safety concern that anytime having active suicidal thoughts or homicidal thought then she need to call 911 of the local emergency room.  Follow-up in 6 weeks.  Time spent 30 minutes.  Most of the time discussed in treatment, long-term prognosis, medication side effects and safety concern.  Follow Up Instructions:    I discussed the assessment and treatment plan with the patient. The patient was provided an opportunity to ask questions and all were answered. The patient  agreed with the plan and demonstrated an understanding of the instructions.   The patient was advised to call back or seek an in-person evaluation if the symptoms worsen or if the condition fails to improve as anticipated.  I provided 25 minutes of non-face-to-face time during this encounter.   Kathlee Nations, MD

## 2019-06-22 ENCOUNTER — Other Ambulatory Visit: Payer: Self-pay

## 2019-06-22 ENCOUNTER — Ambulatory Visit: Payer: PPO | Admitting: Physician Assistant

## 2019-06-22 DIAGNOSIS — J329 Chronic sinusitis, unspecified: Secondary | ICD-10-CM | POA: Diagnosis not present

## 2019-06-22 DIAGNOSIS — G43109 Migraine with aura, not intractable, without status migrainosus: Secondary | ICD-10-CM | POA: Diagnosis not present

## 2019-06-22 MED ORDER — DEXAMETHASONE 0.5 MG PO TABS: ORAL_TABLET | ORAL | 0 refills | Status: DC

## 2019-06-22 MED ORDER — LEVOFLOXACIN 500 MG PO TABS: 500.0000 mg | ORAL_TABLET | Freq: Every day | ORAL | 0 refills | Status: DC

## 2019-06-22 NOTE — Progress Notes (Signed)
THIS ENCOUNTER IS A VIRTUAL VISIT DUE TO COVID-19 - PATIENT WAS NOT SEEN IN THE OFFICE.  PATIENT HAS CONSENTED TO VIRTUAL VISIT / TELEMEDICINE VISIT   Virtual Visit via telephone Note  I connected with Sarah Rodriguez on 06/22/2019 by telephone.  I verified that I am speaking with the correct person using two identifiers.    I discussed the limitations of evaluation and management by telemedicine and the availability of in person appointments. The patient expressed understanding and agreed to proceed.  History of Present Illness: 63 y.o. WF calls with sinus issues.  She states that her right ear and right sinus has been hurting, states her teeth hurt. She states her right eye is throbbing.  She is on allergy meds, has done heating pad, sweet oil in the ear.  She has recurrent sinus infections.   There were no vitals taken for this visit.    Medications  Current Outpatient Medications (Endocrine & Metabolic):  .  dexamethasone (DECADRON) 0.5 MG tablet, 1 tablet PO BID for 3 days, then take 1 tablet PO for 5 days. Marland Kitchen  levothyroxine (SYNTHROID, LEVOTHROID) 50 MCG tablet, TAKE 1/2 TABLET(25 MCG) BY MOUTH DAILY  Current Outpatient Medications (Cardiovascular):  .  losartan (COZAAR) 50 MG tablet, Take 1 tablet (50 mg total) by mouth daily. .  rosuvastatin (CRESTOR) 40 MG tablet, Take 1/2 to 1 tablet daily or as directed for Cholesterol  Current Outpatient Medications (Respiratory):  Marland Kitchen  ADVAIR HFA 115-21 MCG/ACT inhaler, INHALE 2 PUFFS BY MOUTH EVERY 12 HOURS 10 TO 15 MINUTES APART .  albuterol (PROAIR HFA) 108 (90 Base) MCG/ACT inhaler, Inhale 2 puffs into the lungs every 6 (six) hours as needed for wheezing or shortness of breath. .  fexofenadine-pseudoephedrine (ALLEGRA-D 12 HOUR) 60-120 MG per tablet, Take 1 tablet by mouth daily. .  fluticasone (FLONASE) 50 MCG/ACT nasal spray, INSTILL 2 SPRAYS IN EACH NOSTRIL EVERY EVENING .  montelukast (SINGULAIR) 10 MG tablet, TAKE 1 TABLET(10 MG)  BY MOUTH AT BEDTIME  Current Outpatient Medications (Analgesics):  .  meloxicam (MOBIC) 15 MG tablet, TAKE 1 TABLET(15 MG) BY MOUTH DAILY .  Rimegepant Sulfate (NURTEC) 75 MG TBDP, Take 75 mg by mouth as needed (migraine).  Current Outpatient Medications (Hematological):  Marland Kitchen  Cyanocobalamin (VITAMIN B-12) 2500 MCG SUBL, Place 1 tablet under the tongue daily.   Current Outpatient Medications (Other):  .  amitriptyline (ELAVIL) 50 MG tablet, Take 1 tablet (50 mg total) by mouth at bedtime. .  Cholecalciferol (VITAMIN D PO), Take 4,000 Units by mouth daily.  Marland Kitchen  CINNAMON PO, Take 1,000 Units by mouth 2 (two) times daily.  .  clonazePAM (KLONOPIN) 0.5 MG tablet, Take 1/2 to 1 tablet 2 x / day Only if needed for Anxiety Attack or sleep & please try to limit to 5 days /week to avoid addiction .  diclofenac sodium (VOLTAREN) 1 % GEL, Apply 4 g topically 4 (four) times daily. .  DULoxetine (CYMBALTA) 30 MG capsule, Take 30 mg by mouth daily. .  fluocinonide cream (LIDEX) 0.05 %, Apply to rash 2 x / day as needed .  gabapentin (NEURONTIN) 100 MG capsule, Take 100 mg by mouth 3 (three) times daily. .  halobetasol (ULTRAVATE) 0.05 % ointment,  .  lamoTRIgine (LAMICTAL) 25 MG tablet, Take 1 tab daily .  Magnesium 250 MG TABS, Take 3 tablets by mouth daily.  Marland Kitchen  nystatin (MYCOSTATIN) 100000 UNIT/ML suspension, Take 5 mLs (500,000 Units total) by mouth 4 (four) times  daily. .  pantoprazole (PROTONIX) 40 MG tablet, TAKE 1 TABLET(40 MG) BY MOUTH TWICE DAILY .  PREMARIN vaginal cream, INSERT 1/2 GRAM VAGINALLY VIA APPLICATOR NIGHTLY FOR W EEKS THEN JUST DO TEICE A WEEK  Problem list She has Hyperlipidemia, mixed; Hypertension; Asthma; GERD (gastroesophageal reflux disease); Hypothyroidism; Vitamin D deficiency; Medication management; Prediabetes; Spondylolisthesis at L4-L5 level; Encounter for Medicare annual wellness exam; Migraine; Depression, major, recurrent, in partial remission ([place]); Degenerative joint  disease; and Chronic back pain on their problem list.   Observations/Objective: General Appearance:Well sounding, in no apparent distress.  ENT/Mouth: No hoarseness, No cough for duration of visit.  Respiratory: completing full sentences without distress, without audible wheeze Neuro: Awake and oriented X 3,  Psych:  Insight and Judgment appropriate.    Assessment and Plan:  Recurrent sinus infections Will refer to ENT, rule out polyps Will treat this time -     levofloxacin (LEVAQUIN) 500 MG tablet; Take 1 tablet (500 mg total) by mouth daily. -     dexamethasone (DECADRON) 0.5 MG tablet; 1 tablet PO BID for 3 days, then take 1 tablet PO for 5 days. -     Ambulatory referral to ENT   Follow Up Instructions:  I discussed the assessment and treatment plan with the patient. The patient was provided an opportunity to ask questions and all were answered. The patient agreed with the plan and demonstrated an understanding of the instructions.   The patient was advised to call back or seek an in-person evaluation if the symptoms worsen or if the condition fails to improve as anticipated.  I provided 15 minutes of non-face-to-face time during this encounter.   Vicie Mutters, PA-C

## 2019-07-03 ENCOUNTER — Other Ambulatory Visit: Payer: Self-pay | Admitting: Physician Assistant

## 2019-07-03 DIAGNOSIS — G43109 Migraine with aura, not intractable, without status migrainosus: Secondary | ICD-10-CM

## 2019-07-03 DIAGNOSIS — J329 Chronic sinusitis, unspecified: Secondary | ICD-10-CM

## 2019-07-03 MED ORDER — DEXAMETHASONE 0.5 MG PO TABS: ORAL_TABLET | ORAL | 0 refills | Status: DC

## 2019-07-04 MED ORDER — FLUCONAZOLE 150 MG PO TABS: 150.0000 mg | ORAL_TABLET | Freq: Every day | ORAL | 3 refills | Status: DC

## 2019-07-18 DIAGNOSIS — H9209 Otalgia, unspecified ear: Secondary | ICD-10-CM | POA: Diagnosis not present

## 2019-07-18 DIAGNOSIS — H903 Sensorineural hearing loss, bilateral: Secondary | ICD-10-CM | POA: Diagnosis not present

## 2019-07-18 DIAGNOSIS — J343 Hypertrophy of nasal turbinates: Secondary | ICD-10-CM | POA: Diagnosis not present

## 2019-07-18 DIAGNOSIS — J310 Chronic rhinitis: Secondary | ICD-10-CM | POA: Diagnosis not present

## 2019-07-18 DIAGNOSIS — H6123 Impacted cerumen, bilateral: Secondary | ICD-10-CM | POA: Diagnosis not present

## 2019-07-23 ENCOUNTER — Encounter: Payer: Self-pay | Admitting: Internal Medicine

## 2019-08-01 ENCOUNTER — Ambulatory Visit (INDEPENDENT_AMBULATORY_CARE_PROVIDER_SITE_OTHER): Payer: PPO | Admitting: Psychiatry

## 2019-08-01 ENCOUNTER — Encounter (HOSPITAL_COMMUNITY): Payer: Self-pay | Admitting: Psychiatry

## 2019-08-01 ENCOUNTER — Other Ambulatory Visit: Payer: Self-pay

## 2019-08-01 DIAGNOSIS — F411 Generalized anxiety disorder: Secondary | ICD-10-CM

## 2019-08-01 DIAGNOSIS — F410 Panic disorder [episodic paroxysmal anxiety] without agoraphobia: Secondary | ICD-10-CM | POA: Diagnosis not present

## 2019-08-01 DIAGNOSIS — F331 Major depressive disorder, recurrent, moderate: Secondary | ICD-10-CM | POA: Diagnosis not present

## 2019-08-01 MED ORDER — DULOXETINE HCL 30 MG PO CPEP: 30.0000 mg | ORAL_CAPSULE | Freq: Every day | ORAL | 0 refills | Status: DC

## 2019-08-01 MED ORDER — AMITRIPTYLINE HCL 25 MG PO TABS: 25.0000 mg | ORAL_TABLET | Freq: Every day | ORAL | 0 refills | Status: DC

## 2019-08-01 MED ORDER — LAMOTRIGINE 25 MG PO TABS: ORAL_TABLET | ORAL | 0 refills | Status: DC

## 2019-08-01 MED ORDER — CLONAZEPAM 0.5 MG PO TABS: ORAL_TABLET | ORAL | 1 refills | Status: DC

## 2019-08-01 NOTE — Progress Notes (Signed)
Virtual Visit via Telephone Note  I connected with Sarah Rodriguez on 08/01/19 at 11:20 AM EDT by telephone and verified that I am speaking with the correct person using two identifiers.   I discussed the limitations, risks, security and privacy concerns of performing an evaluation and management service by telephone and the availability of in person appointments. I also discussed with the patient that there may be a patient responsible charge related to this service. The patient expressed understanding and agreed to proceed.   History of Present Illness: Patient was evaluated by phone session.  She is doing much better since she was able to talk to her nephew and niece.  She feels her family situation is much better since her brother let her talk with the niece and nephew.  She was very upset on her brother who refused to talk and did not allow his children to talk to her after the death of the patient's father.  She feels things are much better now.  She did not go up on amitriptyline because recently she was given muscle relaxant and that is helping her sleep.  She is still taking 25 mg amitriptyline, Cymbalta 30 mg daily, Klonopin 0.5 mg half to 1 tablet as needed and Lamictal 25 mg daily.  She denies any crying spells or any feeling of hopelessness or worthlessness.  Her energy level is good.  She does not feel that she need to see a therapist.  She denies any panic attack.  She denies any suicidal thoughts.  Her Klonopin and Cymbalta is given by primary care physician however she like to move these prescription to [place].  She has no rash, itching, tremors or shakes.  She denies drinking or using any illegal substances.  Past Psychiatric History:Reviewed H/Odepression and taking antidepressants since 1996. H/Opanic attacks while driving. SawDr. Letta Moynahan and tried Prozac, Paxil, Zoloft, Xanax, Cymbalta and Klonopin. No h/oinpatient treatment or any suicidal attempt.   Psychiatric  Specialty Exam: Physical Exam  ROS  There were no vitals taken for this visit.There is no height or weight on file to calculate BMI.  General Appearance: NA  Eye Contact:  NA  Speech:  Clear and Coherent  Volume:  Normal  Mood:  Anxious  Affect:  NA  Thought Process:  Goal Directed  Orientation:  Full (Time, Place, and Person)  Thought Content:  Rumination  Suicidal Thoughts:  No  Homicidal Thoughts:  No  Memory:  Immediate;   Fair Recent;   Good Remote;   Good  Judgement:  Good  Insight:  Fair  Psychomotor Activity:  NA  Concentration:  Concentration: Fair and Attention Span: Fair  Recall:  Good  Fund of Knowledge:  Good  Language:  Good  Akathisia:  No  Handed:  Right  AIMS (if indicated):     Assets:  Communication Skills Desire for Improvement Housing Resilience  ADL's:  Intact  Cognition:  WNL  Sleep:         Assessment and Plan: Major depressive disorder, recurrent.  Generalized anxiety disorder.  Panic attack.  Patient is doing much better since her family situation got better.  Recommend to go back on amitriptyline 25 mg at bedtime and since she is taking Flexeril which is helping her sleep I also recommend that she can stopped the amitriptyline if she is sleeping very good.  She is already taking Cymbalta 30 mg daily and Klonopin 0.5 mg half to 1 tablet as needed.  Patient promised that she will  try to stop the amitriptyline however she need a refill in case the plan did not work.  I agree with the patient.  We will provide amitriptyline 25 mg at bedtime and Lamictal 25 mg daily.  We will also provide Cymbalta 30 mg daily and Klonopin 0.5 mg as patient like to move these medication from primary care physician to our office.  Discussed medication side effects and benefits especially benzodiazepine dependence tolerance and withdrawal.  Recommended to call us back if he has any question or any concern.  Follow-up in 3 months.  Follow Up Instructions:    I  discussed the assessment and treatment plan with the patient. The patient was provided an opportunity to ask questions and all were answered. The patient agreed with the plan and demonstrated an understanding of the instructions.   The patient was advised to call back or seek an in-person evaluation if the symptoms worsen or if the condition fails to improve as anticipated.  I provided 20 minutes of non-face-to-face time during this encounter.   Kathlee Nations, MD

## 2019-08-03 ENCOUNTER — Encounter: Payer: Self-pay | Admitting: Internal Medicine

## 2019-08-27 ENCOUNTER — Other Ambulatory Visit: Payer: Self-pay | Admitting: *Deleted

## 2019-08-27 MED ORDER — PANTOPRAZOLE SODIUM 40 MG PO TBEC: DELAYED_RELEASE_TABLET | ORAL | 1 refills | Status: DC

## 2019-09-02 ENCOUNTER — Encounter: Payer: Self-pay | Admitting: Internal Medicine

## 2019-09-02 NOTE — Progress Notes (Signed)
Annual Screening/Preventative Visit & Comprehensive Evaluation &  Examination     This very nice 63 y.o. MWF  presents for a Screening /Preventative Visit & comprehensive evaluation and management of multiple medical co-morbidities.  Patient has been followed for HTN, HLD, Prediabetes  and Vitamin D Deficiency. Patient has GERD controlled on her Pantoprazole.     Patient  is on SS Disability since 2016 for a Chronic Pain Syndrome consequent of  Cx DDD / Cx HNP  With  1st surgery in 2011.Then in 2013,she underwent a 2sd surgery at L5/S1 Surg by Dr Ellene Route and 3rd surgery aLumbar Fusion in Nov 2016.  She reports chronic intermittent RLE pain.      HTN predates since 2007. Patient's BP has been controlled at home and patient denies any cardiac symptoms as chest pain, palpitations, shortness of breath, dizziness or ankle swelling. Today's BP is at goal - 120/82.      Patient's hyperlipidemia is controlled with diet and medications. Patient denies myalgias or other medication SE's. Last lipids were at goal albeit elevated Trig's:  Lab Results  Component Value Date   CHOL 155 05/31/2019   HDL 44 (L) 05/31/2019   LDLCALC 80 05/31/2019   TRIG 212 (H) 05/31/2019   CHOLHDL 3.5 05/31/2019      Patient has hx/o prediabetes (A1c 5.9% /  2009 & 6.3% / 2014) and patient denies reactive hypoglycemic symptoms, visual blurring, diabetic polys or paresthesias. Last A1c was near goal:  Lab Results  Component Value Date   HGBA1C 5.8 (H) 05/31/2019      Patient was dx'd Hypothyroid in 2009 and has been on thyroid replacement since.      Finally, patient has history of Vitamin D Deficiency("9" / 2009)  and last Vitamin D was at goal:  Lab Results  Component Value Date   VD25OH 61 05/31/2019   Current Outpatient Medications on File Prior to Visit  Medication Sig  . ADVAIR HFA 115-21 MCG/ACT inhaler INHALE 2 PUFFS BY MOUTH EVERY 12 HOURS 10 TO 15 MINUTES APART  . albuterol (PROAIR HFA) 108 (90 Base)  MCG/ACT inhaler Inhale 2 puffs into the lungs every 6 (six) hours as needed for wheezing or shortness of breath.  Marland Kitchen amitriptyline (ELAVIL) 25 MG tablet Take 1 tablet (25 mg total) by mouth at bedtime.  . Cholecalciferol (VITAMIN D PO) Take 4,000 Units by mouth daily.   Marland Kitchen CINNAMON PO Take 1,000 Units by mouth 2 (two) times daily.   . clonazePAM (KLONOPIN) 0.5 MG tablet Take 1/2 to 1 tablet at bed time for anxiety.  . Cyanocobalamin (VITAMIN B-12) 2500 MCG SUBL Place 1 tablet under the tongue daily.   . cyclobenzaprine (FLEXERIL) 10 MG tablet Take 10 mg by mouth at bedtime.  . diclofenac sodium (VOLTAREN) 1 % GEL Apply 4 g topically 4 (four) times daily.  . DULoxetine (CYMBALTA) 30 MG capsule Take 1 capsule (30 mg total) by mouth daily.  . fexofenadine-pseudoephedrine (ALLEGRA-D 12 HOUR) 60-120 MG per tablet Take 1 tablet by mouth daily.  . fluconazole (DIFLUCAN) 150 MG tablet Take 1 tablet (150 mg total) by mouth daily.  . fluocinonide cream (LIDEX) 0.05 % Apply to rash 2 x / day as needed  . fluticasone (FLONASE) 50 MCG/ACT nasal spray INSTILL 2 SPRAYS IN EACH NOSTRIL EVERY EVENING  . gabapentin (NEURONTIN) 100 MG capsule Take 100 mg by mouth 3 (three) times daily.  . halobetasol (ULTRAVATE) 0.05 % ointment   . lamoTRIgine (LAMICTAL) 25 MG tablet Take 1  tab daily  . levofloxacin (LEVAQUIN) 500 MG tablet Take 1 tablet (500 mg total) by mouth daily.  Marland Kitchen levothyroxine (SYNTHROID, LEVOTHROID) 50 MCG tablet TAKE 1/2 TABLET(25 MCG) BY MOUTH DAILY  . losartan (COZAAR) 50 MG tablet Take 1 tablet (50 mg total) by mouth daily.  . Magnesium 250 MG TABS Take 3 tablets by mouth daily.   . meloxicam (MOBIC) 15 MG tablet TAKE 1 TABLET(15 MG) BY MOUTH DAILY  . montelukast (SINGULAIR) 10 MG tablet TAKE 1 TABLET(10 MG) BY MOUTH AT BEDTIME  . nystatin (MYCOSTATIN) 100000 UNIT/ML suspension Take 5 mLs (500,000 Units total) by mouth 4 (four) times daily.  . pantoprazole (PROTONIX) 40 MG tablet TAKE 1 TABLET(40 MG)  BY MOUTH TWICE DAILY  . PREMARIN vaginal cream INSERT 1/2 GRAM VAGINALLY VIA APPLICATOR NIGHTLY FOR W EEKS THEN JUST DO TEICE A WEEK  . Rimegepant Sulfate (NURTEC) 75 MG TBDP Take 75 mg by mouth as needed (migraine).  . rosuvastatin (CRESTOR) 40 MG tablet Take 1/2 to 1 tablet daily or as directed for Cholesterol   No current facility-administered medications on file prior to visit.    Allergies  Allergen Reactions  . Demerol [Meperidine] Other (See Comments)    REACTION: unknown--itching  . Epinephrine Other (See Comments)    "Sensitivity."  . Niacin And Related Rash   Past Medical History:  Diagnosis Date  . Asthma    Dulera daily  . Chronic back pain    HNP  . Complication of anesthesia    slow to wake up  . Depression    takes Clonazepam nightly  . GERD (gastroesophageal reflux disease)    takes Protonix daily   . Heart murmur   . History of migraine    takes Relpax daily as needed  . HSV (herpes simplex virus) infection   . Hyperlipidemia    takes Atorvastatin daily  . Hypertension    takes Benicar daily  . Hypothyroidism    takes Synthroid daily  . MVP (mitral valve prolapse)   . Pneumonia    hx of--been many yrs ago  . Seasonal allergies    takes Allegra daily  . Shortness of breath    with exertion   Health Maintenance  Topic Date Due  . INFLUENZA VACCINE  06/30/2019  . MAMMOGRAM  10/21/2019  . COLONOSCOPY  10/04/2023  . TETANUS/TDAP  05/05/2026  . Hepatitis C Screening  Completed  . HIV Screening  Completed   Immunization History  Administered Date(s) Administered  . Influenza Inj Mdck Quad With Preservative 09/14/2017, 10/19/2018  . Influenza,inj,quad, With Preservative 11/09/2016  . Influenza-Unspecified 09/18/2013  . PPD Test 04/17/2014  . Pneumococcal-Unspecified 11/30/1995  . Td 08/27/2005, 05/05/2016   Last Colon - 10/03/2013 - Dr Romilda Garret - Recc 5 yr f/u - 0verdue - patient cancelled due to out of town caring for her father who died & she's  aware to reschedule.    Last MGM - 10/19/2018  Past Surgical History:  Procedure Laterality Date  . ABLATION  2011   Uterine  . buttocks surgery     at age 27 (coccyx repair)  . CERVICAL FUSION  03/2010  . CESAREAN SECTION  1992  . CHOLECYSTECTOMY  2008  . COLONOSCOPY    . DILATION AND CURETTAGE OF UTERUS  2011  . ESOPHAGOGASTRODUODENOSCOPY    . LUMBAR LAMINECTOMY/DECOMPRESSION MICRODISCECTOMY  10/24/2012   Procedure: LUMBAR LAMINECTOMY/DECOMPRESSION MICRODISCECTOMY 1 LEVEL;  Surgeon: Kristeen Miss, MD;  Location: [facility];  Service: Neurosurgery;  Laterality: Right;  Right Lumbar five-Sacral One Microdiskectomy  . right knee arthroscopy     x 3  . TONSILLECTOMY     at age 6   Family History  Problem Relation Age of Onset  . Arthritis Mother   . Heart attack Mother   . Depression Mother   . Allergies Mother   . Hypertension Mother   . Heart attack Father    Social History   Tobacco Use  . Smoking status: Former Research scientist (life sciences)  . Smokeless tobacco: Never Used  . Tobacco comment: quit at age 2  Substance Use Topics  . Alcohol use: No    Comment: 2-3 times a yr  . Drug use: No    ROS Constitutional: Denies fever, chills, weight loss/gain, headaches, insomnia,  night sweats, and change in appetite. Does c/o fatigue. Eyes: Denies redness, blurred vision, diplopia, discharge, itchy, watery eyes.  ENT: Denies discharge, congestion, post nasal drip, epistaxis, sore throat, earache, hearing loss, dental pain, Tinnitus, Vertigo, Sinus pain, snoring.  Cardio: Denies chest pain, palpitations, irregular heartbeat, syncope, dyspnea, diaphoresis, orthopnea, PND, claudication, edema Respiratory: denies cough, dyspnea, DOE, pleurisy, hoarseness, laryngitis, wheezing.  Gastrointestinal: Denies dysphagia, heartburn, reflux, water brash, pain, cramps, nausea, vomiting, bloating, diarrhea, constipation, hematemesis, melena, hematochezia, jaundice, hemorrhoids Genitourinary: Denies dysuria,  frequency, urgency, nocturia, hesitancy, discharge, hematuria, flank pain Breast: Breast lumps, nipple discharge, bleeding.  Musculoskeletal: Denies arthralgia, myalgia, stiffness, Jt. Swelling, pain, limp, and strain/sprain. Denies falls. Skin: Denies puritis, rash, hives, warts, acne, eczema, changing in skin lesion Neuro: No weakness, tremor, incoordination, spasms, paresthesia, pain Psychiatric: Denies confusion, memory loss, sensory loss. Denies Depression. Endocrine: Denies change in weight, skin, hair change, nocturia, and paresthesia, diabetic polys, visual blurring, hyper / hypo glycemic episodes.  Heme/Lymph: No excessive bleeding, bruising, enlarged lymph nodes.  Physical Exam  BP 120/82   Pulse 72   Temp (!) 97.3 F (36.3 C)   Resp 16   Ht 5' 1.5" (1.562 m)   Wt 123 lb 6.4 oz (56 kg)   BMI 22.94 kg/m   General Appearance: Well nourished, well groomed and in no apparent distress.  Eyes: PERRLA, EOMs, conjunctiva no swelling or erythema, normal fundi and vessels. Sinuses: No frontal/maxillary tenderness ENT/Mouth: EACs patent / TMs  nl. Nares clear without erythema, swelling, mucoid exudates. Oral hygiene is good. No erythema, swelling, or exudate. Tongue normal, non-obstructing. Tonsils not swollen or erythematous. Hearing normal.  Neck: Supple, thyroid not palpable. No bruits, nodes or JVD. Respiratory: Respiratory effort normal.  BS equal and clear bilateral without rales, rhonci, wheezing or stridor. Cardio: Heart sounds are normal with regular rate and rhythm and no murmurs, rubs or gallops. Peripheral pulses are normal and equal bilaterally without edema. No aortic or femoral bruits. Chest: symmetric with normal excursions and percussion. Breasts: Symmetric, without lumps, nipple discharge, retractions, or fibrocystic changes.  Abdomen: Flat, soft with bowel sounds active. Nontender, no guarding, rebound, hernias, masses, or organomegaly.  Lymphatics: Non tender  without lymphadenopathy.  Musculoskeletal: Full ROM all peripheral extremities, joint stability, 5/5 strength, and normal gait. Skin: Warm and dry without rashes, lesions, cyanosis, clubbing or  ecchymosis.  Neuro: Cranial nerves intact, reflexes equal bilaterally. Normal muscle tone, no cerebellar symptoms. Sensation intact.  Pysch: Alert and oriented X 3, normal affect, Insight and Judgment appropriate.   Assessment and Plan  1. Annual Preventative Screening Examination  2. Essential hypertension  - EKG 12-Lead - Urinalysis, Routine w reflex microscopic - Microalbumin / creatinine urine ratio - CBC with Differential/Platelet - COMPLETE  METABOLIC PANEL WITH GFR - Magnesium - TSH  3. Hyperlipidemia, mixed  - EKG 12-Lead - Lipid panel - TSH  4. Abnormal glucose  - EKG 12-Lead - Hemoglobin A1c - Insulin, random  5. Vitamin D deficiency  - VITAMIN D 25 Hydroxyl  6. Prediabetes  - EKG 12-Lead - Hemoglobin A1c - Insulin, random  7. Hypothyroidism  - TSH  8. Migraine  9. Chronic back pain  10. DDD (degenerative disc disease), cervical   11. Screening for ischemic heart disease  - EKG 12-Lead  12. FHx: heart disease  - EKG 12-Lead  13. Former smoker  - EKG 12-Lead  14. Medication management  - Urinalysis, Routine w reflex microscopic - Microalbumin / creatinine urine ratio - CBC with Differential/Platelet - COMPLETE METABOLIC PANEL WITH GFR - Magnesium - Lipid panel - TSH - Hemoglobin A1c - Insulin, random - VITAMIN D 25 Hydroxyl  15. Screening for colorectal cancer  - POC Hemoccult Bld/Stl       Patient was counseled in prudent diet to achieve/maintain BMI less than 25 for weight control, BP monitoring, regular exercise and medications. Discussed med's effects and SE's. Screening labs and tests as requested with regular follow-up as recommended. Over 40 minutes of exam, counseling, chart review and high complex critical decision making was  performed.   Kirtland Bouchard, MD

## 2019-09-02 NOTE — Patient Instructions (Signed)

## 2019-09-03 ENCOUNTER — Other Ambulatory Visit: Payer: Self-pay

## 2019-09-03 ENCOUNTER — Ambulatory Visit (INDEPENDENT_AMBULATORY_CARE_PROVIDER_SITE_OTHER): Payer: PPO | Admitting: Internal Medicine

## 2019-09-03 VITALS — BP 120/82 | HR 72 | Temp 97.3°F | Resp 16 | Ht 61.5 in | Wt 123.4 lb

## 2019-09-03 DIAGNOSIS — Z136 Encounter for screening for cardiovascular disorders: Secondary | ICD-10-CM

## 2019-09-03 DIAGNOSIS — Z87891 Personal history of nicotine dependence: Secondary | ICD-10-CM

## 2019-09-03 DIAGNOSIS — R7303 Prediabetes: Secondary | ICD-10-CM

## 2019-09-03 DIAGNOSIS — G8929 Other chronic pain: Secondary | ICD-10-CM

## 2019-09-03 DIAGNOSIS — Z0000 Encounter for general adult medical examination without abnormal findings: Secondary | ICD-10-CM

## 2019-09-03 DIAGNOSIS — Z1211 Encounter for screening for malignant neoplasm of colon: Secondary | ICD-10-CM

## 2019-09-03 DIAGNOSIS — I10 Essential (primary) hypertension: Secondary | ICD-10-CM

## 2019-09-03 DIAGNOSIS — E782 Mixed hyperlipidemia: Secondary | ICD-10-CM

## 2019-09-03 DIAGNOSIS — M5030 Other cervical disc degeneration, unspecified cervical region: Secondary | ICD-10-CM

## 2019-09-03 DIAGNOSIS — E559 Vitamin D deficiency, unspecified: Secondary | ICD-10-CM

## 2019-09-03 DIAGNOSIS — Z1212 Encounter for screening for malignant neoplasm of rectum: Secondary | ICD-10-CM

## 2019-09-03 DIAGNOSIS — R7309 Other abnormal glucose: Secondary | ICD-10-CM

## 2019-09-03 DIAGNOSIS — E039 Hypothyroidism, unspecified: Secondary | ICD-10-CM

## 2019-09-03 DIAGNOSIS — Z0001 Encounter for general adult medical examination with abnormal findings: Secondary | ICD-10-CM

## 2019-09-03 DIAGNOSIS — Z8249 Family history of ischemic heart disease and other diseases of the circulatory system: Secondary | ICD-10-CM

## 2019-09-03 DIAGNOSIS — Z79899 Other long term (current) drug therapy: Secondary | ICD-10-CM

## 2019-09-03 DIAGNOSIS — G43109 Migraine with aura, not intractable, without status migrainosus: Secondary | ICD-10-CM

## 2019-09-04 LAB — CBC WITH DIFFERENTIAL/PLATELET
Absolute Monocytes: 422 cells/uL (ref 200–950)
Basophils Absolute: 40 cells/uL (ref 0–200)
Basophils Relative: 0.7 %
Eosinophils Absolute: 188 cells/uL (ref 15–500)
Eosinophils Relative: 3.3 %
HCT: 39.1 % (ref 35.0–45.0)
Hemoglobin: 13.1 g/dL (ref 11.7–15.5)
Lymphs Abs: 1619 cells/uL (ref 850–3900)
MCH: 30.3 pg (ref 27.0–33.0)
MCHC: 33.5 g/dL (ref 32.0–36.0)
MCV: 90.5 fL (ref 80.0–100.0)
MPV: 10 fL (ref 7.5–12.5)
Monocytes Relative: 7.4 %
Neutro Abs: 3431 cells/uL (ref 1500–7800)
Neutrophils Relative %: 60.2 %
Platelets: 265 10*3/uL (ref 140–400)
RBC: 4.32 10*6/uL (ref 3.80–5.10)
RDW: 12.5 % (ref 11.0–15.0)
Total Lymphocyte: 28.4 %
WBC: 5.7 10*3/uL (ref 3.8–10.8)

## 2019-09-04 LAB — URINALYSIS, ROUTINE W REFLEX MICROSCOPIC
Bilirubin Urine: NEGATIVE
Glucose, UA: NEGATIVE
Hgb urine dipstick: NEGATIVE
Ketones, ur: NEGATIVE
Leukocytes,Ua: NEGATIVE
Nitrite: NEGATIVE
Protein, ur: NEGATIVE
Specific Gravity, Urine: 1.021 (ref 1.001–1.03)
pH: 6 (ref 5.0–8.0)

## 2019-09-04 LAB — COMPLETE METABOLIC PANEL WITH GFR
AG Ratio: 1.9 (calc) (ref 1.0–2.5)
ALT: 18 U/L (ref 6–29)
AST: 18 U/L (ref 10–35)
Albumin: 4.3 g/dL (ref 3.6–5.1)
Alkaline phosphatase (APISO): 62 U/L (ref 37–153)
BUN: 13 mg/dL (ref 7–25)
CO2: 29 mmol/L (ref 20–32)
Calcium: 9.5 mg/dL (ref 8.6–10.4)
Chloride: 105 mmol/L (ref 98–110)
Creat: 0.83 mg/dL (ref 0.50–0.99)
GFR, Est African American: 88 mL/min/{1.73_m2} (ref >=60)
GFR, Est Non African American: 76 mL/min/{1.73_m2} (ref >=60)
Globulin: 2.3 g/dL (calc) (ref 1.9–3.7)
Glucose, Bld: 111 mg/dL — ABNORMAL HIGH (ref 65–99)
Potassium: 4 mmol/L (ref 3.5–5.3)
Sodium: 143 mmol/L (ref 135–146)
Total Bilirubin: 0.4 mg/dL (ref 0.2–1.2)
Total Protein: 6.6 g/dL (ref 6.1–8.1)

## 2019-09-04 LAB — LIPID PANEL
Cholesterol: 175 mg/dL (ref <200)
HDL: 39 mg/dL — ABNORMAL LOW (ref >=50)
LDL Cholesterol (Calc): 103 mg/dL (calc) — ABNORMAL HIGH
Non-HDL Cholesterol (Calc): 136 mg/dL (calc) — ABNORMAL HIGH (ref <130)
Total CHOL/HDL Ratio: 4.5 (calc) (ref <5.0)
Triglycerides: 214 mg/dL — ABNORMAL HIGH (ref <150)

## 2019-09-04 LAB — MICROALBUMIN / CREATININE URINE RATIO
Creatinine, Urine: 138 mg/dL (ref 20–275)
Microalb Creat Ratio: 4 mcg/mg creat (ref <30)
Microalb, Ur: 0.6 mg/dL

## 2019-09-04 LAB — HEMOGLOBIN A1C
Hgb A1c MFr Bld: 5.7 % of total Hgb — ABNORMAL HIGH (ref <5.7)
Mean Plasma Glucose: 117 (calc)
eAG (mmol/L): 6.5 (calc)

## 2019-09-04 LAB — VITAMIN D 25 HYDROXY (VIT D DEFICIENCY, FRACTURES): Vit D, 25-Hydroxy: 46 ng/mL (ref 30–100)

## 2019-09-04 LAB — TSH: TSH: 1.19 mIU/L (ref 0.40–4.50)

## 2019-09-04 LAB — MAGNESIUM: Magnesium: 2 mg/dL (ref 1.5–2.5)

## 2019-09-04 LAB — INSULIN, RANDOM: Insulin: 33.3 u[IU]/mL — ABNORMAL HIGH

## 2019-10-12 DIAGNOSIS — M5416 Radiculopathy, lumbar region: Secondary | ICD-10-CM | POA: Diagnosis not present

## 2019-10-16 ENCOUNTER — Other Ambulatory Visit: Payer: Self-pay | Admitting: Internal Medicine

## 2019-10-16 DIAGNOSIS — I10 Essential (primary) hypertension: Secondary | ICD-10-CM

## 2019-10-16 DIAGNOSIS — J329 Chronic sinusitis, unspecified: Secondary | ICD-10-CM

## 2019-10-17 ENCOUNTER — Telehealth: Payer: Self-pay | Admitting: *Deleted

## 2019-10-17 ENCOUNTER — Other Ambulatory Visit: Payer: Self-pay | Admitting: Internal Medicine

## 2019-10-17 DIAGNOSIS — I10 Essential (primary) hypertension: Secondary | ICD-10-CM

## 2019-10-17 MED ORDER — LOSARTAN POTASSIUM 50 MG PO TABS: ORAL_TABLET | ORAL | 3 refills | Status: DC

## 2019-10-17 MED ORDER — ATENOLOL 50 MG PO TABS: ORAL_TABLET | ORAL | 3 refills | Status: DC

## 2019-10-17 NOTE — Telephone Encounter (Signed)
Patient called and spoke with Dr Melford Aase regarding her recent elevated BP.  Dr Melford Aase sent an RX for Atenolol 50 mg and the patient was advised to take the Atenolol in the morning and continue Losartan in the PM.

## 2019-10-22 DIAGNOSIS — N952 Postmenopausal atrophic vaginitis: Secondary | ICD-10-CM | POA: Diagnosis not present

## 2019-10-22 DIAGNOSIS — Z01419 Encounter for gynecological examination (general) (routine) without abnormal findings: Secondary | ICD-10-CM | POA: Diagnosis not present

## 2019-10-22 DIAGNOSIS — Z1231 Encounter for screening mammogram for malignant neoplasm of breast: Secondary | ICD-10-CM | POA: Diagnosis not present

## 2019-10-22 DIAGNOSIS — L292 Pruritus vulvae: Secondary | ICD-10-CM | POA: Diagnosis not present

## 2019-10-22 DIAGNOSIS — Z124 Encounter for screening for malignant neoplasm of cervix: Secondary | ICD-10-CM | POA: Diagnosis not present

## 2019-10-22 LAB — HM MAMMOGRAPHY

## 2019-10-23 MED ORDER — ATENOLOL 50 MG PO TABS: ORAL_TABLET | ORAL | 3 refills | Status: DC

## 2019-10-31 ENCOUNTER — Encounter (HOSPITAL_COMMUNITY): Payer: Self-pay | Admitting: Psychiatry

## 2019-10-31 ENCOUNTER — Ambulatory Visit (INDEPENDENT_AMBULATORY_CARE_PROVIDER_SITE_OTHER): Payer: PPO | Admitting: Psychiatry

## 2019-10-31 ENCOUNTER — Other Ambulatory Visit: Payer: Self-pay

## 2019-10-31 DIAGNOSIS — F411 Generalized anxiety disorder: Secondary | ICD-10-CM | POA: Diagnosis not present

## 2019-10-31 DIAGNOSIS — F331 Major depressive disorder, recurrent, moderate: Secondary | ICD-10-CM

## 2019-10-31 DIAGNOSIS — F410 Panic disorder [episodic paroxysmal anxiety] without agoraphobia: Secondary | ICD-10-CM | POA: Diagnosis not present

## 2019-10-31 MED ORDER — AMITRIPTYLINE HCL 25 MG PO TABS: 25.0000 mg | ORAL_TABLET | Freq: Every day | ORAL | 0 refills | Status: DC

## 2019-10-31 MED ORDER — CLONAZEPAM 0.5 MG PO TABS: ORAL_TABLET | ORAL | 1 refills | Status: DC

## 2019-10-31 MED ORDER — DULOXETINE HCL 40 MG PO CPEP: 40.0000 mg | ORAL_CAPSULE | Freq: Every day | ORAL | 0 refills | Status: DC

## 2019-10-31 MED ORDER — LAMOTRIGINE 25 MG PO TABS: ORAL_TABLET | ORAL | 0 refills | Status: DC

## 2019-10-31 NOTE — Progress Notes (Signed)
Virtual Visit via Telephone Note  I connected with Sarah Rodriguez on 10/31/19 at 11:20 AM EST by telephone and verified that I am speaking with the correct person using two identifiers.   I discussed the limitations, risks, security and privacy concerns of performing an evaluation and management service by telephone and the availability of in person appointments. I also discussed with the patient that there may be a patient responsible charge related to this service. The patient expressed understanding and agreed to proceed.   History of Present Illness: Patient was evaluated by phone session.  She is taking amitriptyline because it is helping her sleep.  We talked about polypharmacy as patient is taking Lamictal, Cymbalta, Klonopin and amitriptyline.  She also takes now Lyrica and muscle relaxant.  She tried to stop the amitriptyline but could not sleep.  She admitted increased anxiety despite taking multiple medication when she goes to her deceased father house.  She is trying to sell the house and sometimes she do not get agree with her brother as both manage the property.  However she denies any recent panic attack or crying spells but do not get very nervous and anxious when she goes to her father's house.  She is pleased that her brother left her talk with niece and nephew.  She admitted increased appetite and 3 pound weight gain as patient is not walking and doing exercise.  She has no tremors shakes or any EPS.  She denies any drinking or using any illegal substances.  Past Psychiatric History:Reviewed H/Odepression and taking antidepressants since 1996. H/Opanic attacks while driving. SawDr. Letta Moynahan and tried Prozac, Paxil, Zoloft, Xanax, Cymbalta and Klonopin. No h/oinpatient treatment or any suicidal attempt.   Psychiatric Specialty Exam: Physical Exam  ROS  There were no vitals taken for this visit.There is no height or weight on file to calculate BMI.  General  Appearance: NA  Eye Contact:  NA  Speech:  Clear and Coherent and Normal Rate  Volume:  Normal  Mood:  Anxious  Affect:  NA  Thought Process:  Goal Directed  Orientation:  Full (Time, Place, and Person)  Thought Content:  Rumination  Suicidal Thoughts:  No  Homicidal Thoughts:  No  Memory:  Immediate;   Good Recent;   Good Remote;   Good  Judgement:  Good  Insight:  Good  Psychomotor Activity:  NA  Concentration:  Concentration: Good and Attention Span: Good  Recall:  Good  Fund of Knowledge:  Good  Language:  Good  Akathisia:  No  Handed:  Right  AIMS (if indicated):     Assets:  Communication Skills Desire for Improvement Housing Resilience Social Support  ADL's:  Intact  Cognition:  WNL  Sleep:   good      Assessment and Plan: Major depressive disorder, recurrent.  Generalized anxiety disorder.  Panic attack.  We discussed polypharmacy.  Despite taking all medication which are below the therapeutic response dose she does not want to stop any of them.  I recommend we should try going up on Cymbalta from 30 mg to 40 mg and if that works then we may need to cut down amitriptyline or Lamictal.  She is recently switched from Neurontin to Lyrica to get a better response on her neuropathy.  She agreed with the plan.  I recommend to call us back if she has any question or any concern.  Discussed medication side effect specially benzodiazepine dependence tolerance and withdrawal.  Recommend to call us  back if she is any question or any concern.  Follow-up in 3 months.  Follow Up Instructions:    I discussed the assessment and treatment plan with the patient. The patient was provided an opportunity to ask questions and all were answered. The patient agreed with the plan and demonstrated an understanding of the instructions.   The patient was advised to call back or seek an in-person evaluation if the symptoms worsen or if the condition fails to improve as anticipated.  I  provided 20 minutes of non-face-to-face time during this encounter.   Kathlee Nations, MD

## 2019-11-06 ENCOUNTER — Other Ambulatory Visit: Payer: Self-pay | Admitting: Internal Medicine

## 2019-11-14 ENCOUNTER — Other Ambulatory Visit: Payer: Self-pay | Admitting: Internal Medicine

## 2019-11-14 DIAGNOSIS — J028 Acute pharyngitis due to other specified organisms: Secondary | ICD-10-CM

## 2019-11-14 DIAGNOSIS — B3789 Other sites of candidiasis: Secondary | ICD-10-CM

## 2019-11-14 MED ORDER — NYSTATIN 100000 UNIT/ML MT SUSP: OROMUCOSAL | 1 refills | Status: DC

## 2019-11-15 ENCOUNTER — Encounter: Payer: Self-pay | Admitting: *Deleted

## 2019-11-19 ENCOUNTER — Other Ambulatory Visit: Payer: Self-pay | Admitting: Adult Health

## 2019-11-19 DIAGNOSIS — J4520 Mild intermittent asthma, uncomplicated: Secondary | ICD-10-CM

## 2019-12-03 NOTE — Progress Notes (Signed)
MEDICARE ANNUAL WELLNESS VISIT AND FOLLOW UP    Encounter for Medicare annual wellness exam  Migraine with aura and without status migrainosus, not intractable Well managed by PRN OTC analgesic and phenergan Given decadron for possible sinus migraine Given sample in office of Nurtec patient to go to ER if there is weakness, thunderclap headache, visual changes, or any concerning factors  Essential hypertension Continue medication Monitor blood pressure at home; call if consistently over 130/80 Continue DASH diet.   Reminder to go to the ER if any CP, SOB, nausea, dizziness, severe HA, changes vision/speech, left arm numbness and tingling and jaw pain.  Uncomplicated asthma, unspecified asthma severity, unspecified whether persistent Well controlled on inhalers without recent flare  Gastroesophageal reflux disease, esophagitis presence not specified Well managed on current medications Discussed diet, avoiding triggers and other lifestyle changes  Hypothyroidism, unspecified typ continue medications the same pending lab results reminded to take on an empty stomach 30-5mins before food.  check TSH level  Spondylolisthesis at L4-L5 level S/p fusion for repair Followed by Dr. Osborne Oman  Primary osteoarthritis involving multiple joints Well managed by motrin/tylenol/lyrica  Vitamin D deficiency At goal at recent check; continue to recommend supplementation Defer vitamin D level  Prediabetes Discussed disease and risks Discussed diet/exercise, weight management  Check A1C q80m; defer today; monitor serum glucose   Medication management CBC, CMP/GFR  Hyperlipidemia, mixed Continue medications: atorvastatin Continue low cholesterol diet and exercise.  Check lipid panel.   Depression, major, recurrent, in partial remission ([place]) Followed by Dr. Adele Schilder Continue medications, reminded to limit benzo use to <5 days a week if possible to avoid dependence  Lifestyle discussed:  diet/exerise, sleep hygiene, stress management, hydration  Chronic back pain, unspecified back location, unspecified back pain laterality Continue follow up with Dr. Lenn Cal lyrica for back pain  BMI 23, adult Continue to recommend diet heavy in fruits and veggies and low in animal meats, cheeses, and dairy products, appropriate calorie intake Discuss exercise recommendations routinely Continue to monitor weight at each visit  Falls Unsteady gait r/t chronic back; follows with neurosurgery/pain Has completed PT; declines referral back today; states has exercises can do at home Reviewed falls risk and fall prevention; information provided  Over 40 minutes of exam, counseling, chart review and critical decision making was performed Future Appointments  Date Time Provider [place]  01/29/2020 10:00 AM Arfeen, Arlyce Harman, MD [facility] None  03/11/2020 11:30 AM Unk Pinto, MD [facility] None  09/24/2020  2:00 PM Unk Pinto, MD [facility] None     Plan:   During the course of the visit the patient was educated and counseled about appropriate screening and preventive services including:    Pneumococcal vaccine   Prevnar 13  Influenza vaccine  Td vaccine  Screening electrocardiogram  Bone densitometry screening  Colorectal cancer screening  Diabetes screening  Glaucoma screening  Nutrition counseling   Advanced directives: requested   Subjective:  Sarah Rodriguez is a 64 y.o. female who presents for Medicare Annual Wellness Visit and 3 month follow up. Patient has GERD controlled on her meds.   She also has Chronic Pain Syndrome consequent of Cx DDD and Cx HNP surg in 2011 and then in 2013, she underwent L5/S1 Surg by Dr Ellene Route and lastly in Nov 2016, she had a Lumbar fusion. She is able to control her pain on lyrica and has avoided Opioids. Does have unsteady gait, has completed PT.   She has hx of migraines; reports rare, typically more sinus.  Typically does well with 1/2 tab excedrine. She does have nurtec samples but hasn't needed to use.    She has history of asthma/allergies, states consistently has cough. She has had some sinus congestion/left side worse. Has seen ENT, flonase recommended.    she has a diagnosis of depression, follows with Dr. Adele Schilder currently fairly controlled on lamictal and amitryptiline, and currently takes klonopin 0.25-0.5 mg BID PRN anxiety and insomnia, reports symptoms are well controlled on current regimen. she takes 0.125 mg of klonopin at night for sleep, uses rarely during the day for anxiety.   BMI is Body mass index is 23.24 kg/m., she admits needs to do better on diet and exercise. Not exercising as much with cold weather, tries to go up and down strairs more often in home. Wt Readings from Last 3 Encounters:  12/05/19 125 lb (56.7 kg)  09/03/19 123 lb 6.4 oz (56 kg)  05/31/19 121 lb 9.6 oz (55.2 kg)   . Her blood pressure has been controlled at home, today their BP is BP: 130/76 She does workout. She denies chest pain, shortness of breath, dizziness.   She is on cholesterol medication (recently transitioned to rosuvastatin 20 mg Tues, Thurs) and denies myalgias. Her cholesterol is not at goal. The cholesterol last visit was:   Lab Results  Component Value Date   CHOL 175 09/03/2019   HDL 39 (L) 09/03/2019   LDLCALC 103 (H) 09/03/2019   TRIG 214 (H) 09/03/2019   CHOLHDL 4.5 09/03/2019    She has been working on diet and exercise for prediabetes, and denies foot ulcerations, increased appetite, nausea, polydipsia, polyuria, visual disturbances, vomiting and weight loss. Last A1C in the office was:  Lab Results  Component Value Date   HGBA1C 5.7 (H) 09/03/2019   She is on thyroid medication. Her medication was not changed last visit.   Lab Results  Component Value Date   TSH 1.19 09/03/2019   Last GFR: Lab Results  Component Value Date   GFRNONAA 76 09/03/2019   Patient is on  Vitamin D supplement, taking 4000 IU daily, was off at last visit.    Lab Results  Component Value Date   VD25OH 46 09/03/2019      Medication Review: Current Outpatient Medications on File Prior to Visit  Medication Sig Dispense Refill  . ADVAIR HFA 115-21 MCG/ACT inhaler INHALE 2 PUFFS BY MOUTH EVERY 12 HOURS 10 TO 15 MINUTES APART 36 g 0  . albuterol (PROAIR HFA) 108 (90 Base) MCG/ACT inhaler Inhale 2 puffs into the lungs every 6 (six) hours as needed for wheezing or shortness of breath. 8 g 1  . amitriptyline (ELAVIL) 25 MG tablet Take 1 tablet (25 mg total) by mouth at bedtime. 90 tablet 0  . atenolol (TENORMIN) 50 MG tablet Take 1 tablet every Morning for BP 90 tablet 3  . Cholecalciferol (VITAMIN D PO) Take 4,000 Units by mouth daily.     Marland Kitchen CINNAMON PO Take 1,000 Units by mouth 2 (two) times daily.     . clonazePAM (KLONOPIN) 0.5 MG tablet Take 1/2 to 1 tablet at bed time for anxiety. 30 tablet 1  . Cyanocobalamin (VITAMIN B-12) 2500 MCG SUBL Place 1 tablet under the tongue daily.     . cyclobenzaprine (FLEXERIL) 10 MG tablet Take 10 mg by mouth at bedtime.    . diclofenac sodium (VOLTAREN) 1 % GEL Apply 4 g topically 4 (four) times daily. 100 g 3  . DULoxetine 40 MG CPEP Take  40 mg by mouth daily. 90 capsule 0  . fexofenadine-pseudoephedrine (ALLEGRA-D 12 HOUR) 60-120 MG per tablet Take 1 tablet by mouth daily.    . fluocinonide cream (LIDEX) 0.05 % Apply to rash 2 x / day as needed 60 g 3  . fluticasone (FLONASE) 50 MCG/ACT nasal spray INSTILL 2 SPRAYS IN EACH NOSTRIL EVERY EVENING 48 g 2  . halobetasol (ULTRAVATE) 0.05 % ointment   3  . lamoTRIgine (LAMICTAL) 25 MG tablet Take 1 tab daily 90 tablet 0  . levothyroxine (SYNTHROID, LEVOTHROID) 50 MCG tablet TAKE 1/2 TABLET(25 MCG) BY MOUTH DAILY 90 tablet 1  . losartan (COZAAR) 50 MG tablet Take 1 tablet at night for BP (Patient taking differently: Take 1/2 tablet at night for BP) 90 tablet 3  . Magnesium 250 MG TABS Take 3 tablets  by mouth daily.     . meloxicam (MOBIC) 15 MG tablet Take 1 tablet Daily with Food for Pain & Inflammation 90 tablet 3  . montelukast (SINGULAIR) 10 MG tablet TAKE 1 TABLET(10 MG) BY MOUTH AT BEDTIME 90 tablet 3  . nystatin (MYCOSTATIN) 100000 UNIT/ML suspension Take 1 teaspoon (17ml)  4 x /day  (Swish & Swallow) 473 mL 1  . pantoprazole (PROTONIX) 40 MG tablet TAKE 1 TABLET(40 MG) BY MOUTH TWICE DAILY 180 tablet 1  . pregabalin (LYRICA) 50 MG capsule Take 50 mg by mouth at bedtime.    Marland Kitchen PREMARIN vaginal cream INSERT 1/2 GRAM VAGINALLY VIA APPLICATOR NIGHTLY FOR W EEKS THEN JUST DO TEICE A WEEK  11  . Rimegepant Sulfate (NURTEC) 75 MG TBDP Take 75 mg by mouth as needed (migraine). 2 tablet 0  . rosuvastatin (CRESTOR) 40 MG tablet Take 1/2 to 1 tablet daily or as directed for Cholesterol 90 tablet 1  . Zinc 50 MG TABS Take by mouth daily.     No current facility-administered medications on file prior to visit.    Allergies  Allergen Reactions  . Demerol [Meperidine] Other (See Comments)    REACTION: unknown--itching  . Epinephrine Other (See Comments)    "Sensitivity."  . Niacin And Related Rash    Current Problems (verified) Patient Active Problem List   Diagnosis Date Noted  . Chronic back pain 12/21/2017  . Depression, major, recurrent, in partial remission ([place]) 08/05/2016  . Degenerative joint disease 08/05/2016  . Migraine 03/03/2016  . Encounter for Medicare annual wellness exam 02/16/2016  . Spondylolisthesis at L4-L5 level 10/27/2015  . Prediabetes 12/12/2014  . Vitamin D deficiency 04/17/2014  . Medication management 04/17/2014  . Hyperlipidemia, mixed   . Hypertension   . Asthma   . GERD (gastroesophageal reflux disease)   . Hypothyroidism     Screening Tests Immunization History  Administered Date(s) Administered  . Influenza Inj Mdck Quad Pf 08/30/2019  . Influenza Inj Mdck Quad With Preservative 09/14/2017, 10/19/2018  . Influenza,inj,quad, With Preservative  11/09/2016  . Influenza-Unspecified 09/18/2013  . PPD Test 04/17/2014  . Pneumococcal-Unspecified 11/30/1995  . Td 08/27/2005, 05/05/2016   Preventative care: Last colonoscopy: 2014 Dr. Cristina Gong, polyp, due 2019, patient is trying to coordinate  Last mammogram: 09/2019 at Dr. Valentino Saxon Last pap smear/pelvic exam: 2017 at GYN, following annually will get next year DEXA: declines Stress test 2006 EGD 2014, Dr. Cristina Gong  Prior vaccinations: TD or Tdap: 2017 Influenza: 2020 Pneumococcal: 1997 Prevnar13: age 38 Shingles/Zostavax: declines due to cost   Names of Other Physician/Practitioners you currently use: 1. [facility]- here for primary care 2.  Dr. Payton [facility], eye doctor, last visit 2020, wears glasses.  3. Dr. Carlye Grippe, dentist, last visit 2020  Patient Care Team: Unk Pinto, MD as PCP - General (Internal Medicine) Kristeen Miss, MD as Consulting Physician (Neurosurgery) Kathlee Nations, MD as Consulting Physician (Psychiatry)  SURGICAL HISTORY She  has a past surgical history that includes Dilation and curettage of uterus (2011); Ablation (2011); Cholecystectomy (2008); Tonsillectomy; buttocks surgery; Cesarean section (1992); right knee arthroscopy; Cervical fusion (03/2010); Colonoscopy; Esophagogastroduodenoscopy; and Lumbar laminectomy/decompression microdiscectomy (10/24/2012). FAMILY HISTORY Her family history includes Allergies in her mother; Arthritis in her mother; Depression in her mother; Heart attack in her father and mother; Hypertension in her mother. SOCIAL HISTORY She  reports that she has quit smoking. She has never used smokeless tobacco. She reports that she does not drink alcohol or use drugs.  MEDICARE WELLNESS OBJECTIVES: Physical activity: Current Exercise Habits: Home exercise routine, Type of exercise: walking;Other - see comments(less recently due to cold weather), Intensity: Mild, Exercise limited by:  orthopedic condition(s) Cardiac risk factors: Cardiac Risk Factors include: advanced age (>34men, >11 women);hypertension;dyslipidemia;sedentary lifestyle;smoking/ tobacco exposure Depression/mood screen:   Depression screen [facility] 2/9 12/05/2019  Decreased Interest 0  Down, Depressed, Hopeless 1  PHQ - 2 Score 1    ADLs:  In your present state of health, do you have any difficulty performing the following activities: 12/05/2019 09/02/2019  Hearing? Y N  Comment mild hearing difficulties just since mask, has hearing aids but doesn't always -  Vision? N N  Difficulty concentrating or making decisions? N N  Walking or climbing stairs? N N  Comment chronic pain of back and extremity; can go but slowly -  Dressing or bathing? N N  Doing errands, shopping? N N  Some recent data might be hidden     Cognitive Testing  Alert? Yes  Normal Appearance?Yes  Oriented to person? Yes  Place? Yes   Time? Yes  Recall of three objects?  Yes  Can perform simple calculations? Yes  Displays appropriate judgment?Yes  Can read the correct time from a watch face?Yes  EOL planning: Does Patient Have a Medical Advance Directive?: Yes Type of Advance Directive: Healthcare Power of Attorney, Living will Does patient want to make changes to medical advance directive?: No - Patient declined Copy of [place] in Chart?: No - copy requested  Review of Systems  Constitutional: Negative for malaise/fatigue and weight loss.  HENT: Negative for hearing loss and tinnitus.   Eyes: Negative for blurred vision and double vision.  Respiratory: Negative for cough, sputum production, shortness of breath and wheezing.   Cardiovascular: Negative for chest pain, palpitations, orthopnea, claudication, leg swelling and PND.  Gastrointestinal: Negative for abdominal pain, blood in stool, constipation, diarrhea, heartburn, melena, nausea and vomiting.  Genitourinary: Negative.   Musculoskeletal: Positive for  back pain, falls (occasional, denies recent months) and joint pain. Negative for myalgias.  Skin: Negative for rash.  Neurological: Negative for dizziness, tingling, sensory change, weakness and headaches.  Endo/Heme/Allergies: Negative for polydipsia.  Psychiatric/Behavioral: Negative.  Negative for depression, memory loss, substance abuse and suicidal ideas. The patient is not nervous/anxious and does not have insomnia.   All other systems reviewed and are negative.    Objective:     Today's Vitals   12/05/19 1138  BP: 130/76  Pulse: 62  Temp: (!) 97.2 F (36.2 C)  SpO2: 96%  Weight: 125 lb (56.7 kg)  Height: 5' 1.5" (1.562 m)   Body mass index is  23.24 kg/m.  General appearance: alert, no distress, WD/WN, female HEENT: normocephalic, sclerae anicteric, TMs pearly, nares patent, no discharge or erythema, dry/flaky skin in canals; mask in place;oral exam deferred Neck: supple, no lymphadenopathy, no thyromegaly, no masses Heart: RRR, normal S1, S2, no murmurs Lungs: CTA bilaterally, no wheezes, rhonchi, or rales Abdomen: +bs, soft, non tender, non distended, no masses, no hepatomegaly, no splenomegaly Musculoskeletal: nontender, no swelling, no obvious deformity Extremities: no edema, no cyanosis, no clubbing Pulses: 2+ symmetric, upper and lower extremities, normal cap refill Neurological: alert, oriented x 3, CN2-12 intact, strength normal upper extremities and lower extremities, sensation normal throughout, DTRs 2+ throughout, no cerebellar signs, gait normal/steady Psychiatric: normal affect, behavior normal, pleasant   Medicare Attestation I have personally reviewed: The patient's medical and social history Their use of alcohol, tobacco or illicit drugs Their current medications and supplements The patient's functional ability including ADLs,fall risks, home safety risks, cognitive, and hearing and visual impairment Diet and physical activities Evidence for depression  or mood disorders  The patient's weight, height, BMI, and visual acuity have been recorded in the chart.  I have made referrals, counseling, and provided education to the patient based on review of the above and I have provided the patient with a written personalized care plan for preventive services.     Izora Ribas, NP   12/05/2019

## 2019-12-05 ENCOUNTER — Ambulatory Visit (INDEPENDENT_AMBULATORY_CARE_PROVIDER_SITE_OTHER): Payer: PPO | Admitting: Adult Health

## 2019-12-05 ENCOUNTER — Encounter: Payer: Self-pay | Admitting: Adult Health

## 2019-12-05 ENCOUNTER — Other Ambulatory Visit: Payer: Self-pay

## 2019-12-05 VITALS — BP 130/76 | HR 62 | Temp 97.2°F | Ht 61.5 in | Wt 125.0 lb

## 2019-12-05 DIAGNOSIS — R7303 Prediabetes: Secondary | ICD-10-CM | POA: Diagnosis not present

## 2019-12-05 DIAGNOSIS — I10 Essential (primary) hypertension: Secondary | ICD-10-CM

## 2019-12-05 DIAGNOSIS — M4316 Spondylolisthesis, lumbar region: Secondary | ICD-10-CM

## 2019-12-05 DIAGNOSIS — E559 Vitamin D deficiency, unspecified: Secondary | ICD-10-CM

## 2019-12-05 DIAGNOSIS — Z0001 Encounter for general adult medical examination with abnormal findings: Secondary | ICD-10-CM | POA: Diagnosis not present

## 2019-12-05 DIAGNOSIS — G43109 Migraine with aura, not intractable, without status migrainosus: Secondary | ICD-10-CM

## 2019-12-05 DIAGNOSIS — J45909 Unspecified asthma, uncomplicated: Secondary | ICD-10-CM | POA: Diagnosis not present

## 2019-12-05 DIAGNOSIS — Z79899 Other long term (current) drug therapy: Secondary | ICD-10-CM | POA: Diagnosis not present

## 2019-12-05 DIAGNOSIS — K219 Gastro-esophageal reflux disease without esophagitis: Secondary | ICD-10-CM | POA: Diagnosis not present

## 2019-12-05 DIAGNOSIS — E782 Mixed hyperlipidemia: Secondary | ICD-10-CM

## 2019-12-05 DIAGNOSIS — F3341 Major depressive disorder, recurrent, in partial remission: Secondary | ICD-10-CM

## 2019-12-05 DIAGNOSIS — Z9181 History of falling: Secondary | ICD-10-CM

## 2019-12-05 DIAGNOSIS — M8949 Other hypertrophic osteoarthropathy, multiple sites: Secondary | ICD-10-CM

## 2019-12-05 DIAGNOSIS — E039 Hypothyroidism, unspecified: Secondary | ICD-10-CM

## 2019-12-05 DIAGNOSIS — M159 Polyosteoarthritis, unspecified: Secondary | ICD-10-CM

## 2019-12-05 DIAGNOSIS — Z0000 Encounter for general adult medical examination without abnormal findings: Secondary | ICD-10-CM

## 2019-12-05 DIAGNOSIS — Z6823 Body mass index (BMI) 23.0-23.9, adult: Secondary | ICD-10-CM

## 2019-12-05 DIAGNOSIS — M549 Dorsalgia, unspecified: Secondary | ICD-10-CM

## 2019-12-05 DIAGNOSIS — R6889 Other general symptoms and signs: Secondary | ICD-10-CM | POA: Diagnosis not present

## 2019-12-05 DIAGNOSIS — G8929 Other chronic pain: Secondary | ICD-10-CM

## 2019-12-05 NOTE — Patient Instructions (Addendum)
Sarah Rodriguez , Thank you for taking time to come for your Medicare Wellness Visit. I appreciate your ongoing commitment to your health goals. Please review the following plan we discussed and let me know if I can assist you in the future.   These are the goals we discussed: Goals    . Exercise 150 min/wk Moderate Activity     Aim for 20-30 min of intentional exercise daily        This is a list of the screening recommended for you and due dates:  Health Maintenance  Topic Date Due  . Mammogram  10/21/2020  . Colon Cancer Screening  10/04/2023  . Tetanus Vaccine  05/05/2026  . Flu Shot  Completed  .  Hepatitis C: One time screening is recommended by Center for Disease Control  (CDC) for  adults born from [birth year] through 1964.   Completed  . HIV Screening  Completed     [employer] with no obligation # [phone] Do not have to be a member Tues-Sat 10-6  [employer]- free test with no obligation # [phone] MUST BE A MEMBER Call for store hours  Have had patient's get good cheaper hearing aids from [employer] The air version has good reviews.     Exercising to Stay Healthy To become healthy and stay healthy, it is recommended that you do moderate-intensity and vigorous-intensity exercise. You can tell that you are exercising at a moderate intensity if your heart starts beating faster and you start breathing faster but can still hold a conversation. You can tell that you are exercising at a vigorous intensity if you are breathing much harder and faster and cannot hold a conversation while exercising. Exercising regularly is important. It has many health benefits, such as:  Improving overall fitness, flexibility, and endurance.  Increasing bone density.  Helping with weight control.  Decreasing body fat.  Increasing muscle strength.  Reducing stress and tension.  Improving overall health. How often should I exercise? Choose an activity  that you enjoy, and set realistic goals. Your health care provider can help you make an activity plan that works for you. Exercise regularly as told by your health care provider. This may include:  Doing strength training two times a week, such as: ? Lifting weights. ? Using resistance bands. ? Push-ups. ? Sit-ups. ? Yoga.  Doing a certain intensity of exercise for a given amount of time. Choose from these options: ? A total of 150 minutes of moderate-intensity exercise every week. ? A total of 75 minutes of vigorous-intensity exercise every week. ? A mix of moderate-intensity and vigorous-intensity exercise every week. Children, pregnant women, people who have not exercised regularly, people who are overweight, and older adults may need to talk with a health care provider about what activities are safe to do. If you have a medical condition, be sure to talk with your health care provider before you start a new exercise program. What are some exercise ideas? Moderate-intensity exercise ideas include:  Walking 1 mile (1.6 km) in about 15 minutes.  Biking.  Hiking.  Golfing.  Dancing.  Water aerobics. Vigorous-intensity exercise ideas include:  Walking 4.5 miles (7.2 km) or more in about 1 hour.  Jogging or running 5 miles (8 km) in about 1 hour.  Biking 10 miles (16.1 km) or more in about 1 hour.  Lap swimming.  Roller-skating or in-line skating.  Cross-country skiing.  Vigorous competitive sports, such as football, basketball, and  soccer.  Jumping rope.  Aerobic dancing. What are some everyday activities that can help me to get exercise?  [place] work, such as: ? Pushing a Conservation officer, nature. ? Raking and bagging leaves.  Washing your car.  Pushing a stroller.  Shoveling snow.  Gardening.  Washing windows or floors. How can I be more active in my day-to-day activities?  Use stairs instead of an elevator.  Take a walk during your lunch break.  If you drive,  park your car farther away from your work or school.  If you take public transportation, get off one stop early and walk the rest of the way.  Stand up or walk around during all of your indoor phone calls.  Get up, stretch, and walk around every 30 minutes throughout the day.  Enjoy exercise with a friend. Support to continue exercising will help you keep a regular routine of activity. What guidelines can I follow while exercising?  Before you start a new exercise program, talk with your health care provider.  Do not exercise so much that you hurt yourself, feel dizzy, or get very short of breath.  Wear comfortable clothes and wear shoes with good support.  Drink plenty of water while you exercise to prevent dehydration or heat stroke.  Work out until your breathing and your heartbeat get faster. Where to find more information  U.S. Department of Health and Human Services: BondedCompany.at  Centers for Disease Control and Prevention (CDC): http://www.wolf.info/ Summary  Exercising regularly is important. It will improve your overall fitness, flexibility, and endurance.  Regular exercise also will improve your overall health. It can help you control your weight, reduce stress, and improve your bone density.  Do not exercise so much that you hurt yourself, feel dizzy, or get very short of breath.  Before you start a new exercise program, talk with your health care provider. This information is not intended to replace advice given to you by your health care provider. Make sure you discuss any questions you have with your health care provider. Document Revised: 10/28/2017 Document Reviewed: 10/06/2017 Elsevier Patient Education  2020 [place] for Falls Each year, millions of people have serious injuries from falls. It is important to understand your risk for falling. Talk with your health care provider about your risk and what you can do to lower it. There are  actions you can take at home to lower your risk. If you do have a serious fall, make sure you tell your health care provider. Falling once raises your risk for falling again. How can falls affect me? Serious injuries from falls are common. These include:  Broken bones, such as hip fractures.  Head injuries, such as traumatic brain injuries (TBI). Fear of falling can also cause you to avoid activities and stay at home. This can make your muscles weaker and actually raise your risk for a fall. What can increase my risk? There are a number of risk factors that increase your risk for falling. The more risk factors you have, the higher your risk for falling. Serious injuries from a fall most often happen to people older than age 107. Children and young adults ages 71-29 are also at higher risk. Common risk factors include:  Weakness in the lower body.  Lack (deficiency) of vitamin D.  Being generally weak or confused due to long-term (chronic) illness.  Dizziness or balance problems.  Poor vision.  Medicines that cause dizziness or drowsiness. These  can include medicines for your blood pressure, heart, anxiety, insomnia, or edema, as well as pain medicines and muscle relaxants. Other risk factors include:  Drinking alcohol.  Having had a fall in the past.  Having depression.  Foot pain or improper footwear.  Working at a dangerous job.  Having any of the following in your home: ? Tripping hazards, such as floor clutter or loose rugs. ? Poor lighting. ? Pets or clutter.  Dementia or memory loss. What actions can I take to lower my risk of falling?     Physical activity Maintain physical fitness. Do strength and balance exercises. Consider taking a regular class to build strength and balance. Yoga and tai chi are good options. Vision Have your eyes checked every year and your vision prescription updated as needed. Walking aids and footwear  Wear nonskid shoes. Do not wear  high heels.  Do not walk around the house in socks or slippers.  Use a cane or walker as told by your health care provider. Home safety  Attach secure railings on both sides of your stairs.  Install grab bars for your tub, shower, and toilet. Use a bath mat in your tub or shower.  Use good lighting in all rooms. Keep a flashlight near your bed.  Make sure there is a clear path from your bed to the bathroom. Use night-lights.  Do not use throw rugs. Make sure all carpeting is taped or tacked down securely.  Remove all clutter from walkways and stairways, including extension cords.  Repair uneven or broken steps.  Avoid walking on icy or slippery surfaces. Walk on the grass instead of on icy or slick sidewalks. Where you can, use ice melt to get rid of ice on walkways.  Use a cordless phone. Questions to ask your health care provider  Can you help me check my risk for a fall?  Do any of my medicines make me more likely to fall?  Should I take a vitamin D supplement?  What exercises can I do to improve my strength and balance?  Should I make an appointment to have my vision checked?  Do I need a bone density test to check for weak bones or osteoporosis?  Would it help to use a cane or a walker? Where to find more information  Centers for Disease Control and Prevention, STEADI: http://www.wolf.info/  Community-Based Fall Prevention Programs: http://www.wolf.info/  National Institute on Aging: ToneConnect.com.ee Contact a health care provider if:  You fall at home.  You are afraid of falling at home.  You feel weak, drowsy, or dizzy. Summary  People 31 and older are at high risk for falling. However, older people are not the only ones injured in falls. Children and young adults have a higher-than-normal risk too.  Talk with your health care provider about your risks for falling and how to lower those risks.  Taking certain precautions at home can lower your risk for falling.  If  you fall, always tell your health care provider. This information is not intended to replace advice given to you by your health care provider. Make sure you discuss any questions you have with your health care provider. Document Revised: 05/23/2019 Document Reviewed: 05/23/2019 Elsevier Patient Education  [place].

## 2019-12-06 LAB — COMPLETE METABOLIC PANEL WITH GFR
AG Ratio: 2.2 (calc) (ref 1.0–2.5)
ALT: 17 U/L (ref 6–29)
AST: 19 U/L (ref 10–35)
Albumin: 4.3 g/dL (ref 3.6–5.1)
Alkaline phosphatase (APISO): 64 U/L (ref 37–153)
BUN: 19 mg/dL (ref 7–25)
CO2: 34 mmol/L — ABNORMAL HIGH (ref 20–32)
Calcium: 9.5 mg/dL (ref 8.6–10.4)
Chloride: 103 mmol/L (ref 98–110)
Creat: 0.93 mg/dL (ref 0.50–0.99)
GFR, Est African American: 76 mL/min/{1.73_m2} (ref >=60)
GFR, Est Non African American: 66 mL/min/{1.73_m2} (ref >=60)
Globulin: 2 g/dL (calc) (ref 1.9–3.7)
Glucose, Bld: 127 mg/dL — ABNORMAL HIGH (ref 65–99)
Potassium: 4.5 mmol/L (ref 3.5–5.3)
Sodium: 140 mmol/L (ref 135–146)
Total Bilirubin: 0.5 mg/dL (ref 0.2–1.2)
Total Protein: 6.3 g/dL (ref 6.1–8.1)

## 2019-12-06 LAB — CBC WITH DIFFERENTIAL/PLATELET
Absolute Monocytes: 486 cells/uL (ref 200–950)
Basophils Absolute: 38 cells/uL (ref 0–200)
Basophils Relative: 0.6 %
Eosinophils Absolute: 198 cells/uL (ref 15–500)
Eosinophils Relative: 3.1 %
HCT: 39 % (ref 35.0–45.0)
Hemoglobin: 13.5 g/dL (ref 11.7–15.5)
Lymphs Abs: 1696 cells/uL (ref 850–3900)
MCH: 31.8 pg (ref 27.0–33.0)
MCHC: 34.6 g/dL (ref 32.0–36.0)
MCV: 91.8 fL (ref 80.0–100.0)
MPV: 10.8 fL (ref 7.5–12.5)
Monocytes Relative: 7.6 %
Neutro Abs: 3981 cells/uL (ref 1500–7800)
Neutrophils Relative %: 62.2 %
Platelets: 205 10*3/uL (ref 140–400)
RBC: 4.25 10*6/uL (ref 3.80–5.10)
RDW: 12 % (ref 11.0–15.0)
Total Lymphocyte: 26.5 %
WBC: 6.4 10*3/uL (ref 3.8–10.8)

## 2019-12-06 LAB — LIPID PANEL
Cholesterol: 174 mg/dL (ref <200)
HDL: 38 mg/dL — ABNORMAL LOW (ref >=50)
LDL Cholesterol (Calc): 102 mg/dL (calc) — ABNORMAL HIGH
Non-HDL Cholesterol (Calc): 136 mg/dL (calc) — ABNORMAL HIGH (ref <130)
Total CHOL/HDL Ratio: 4.6 (calc) (ref <5.0)
Triglycerides: 222 mg/dL — ABNORMAL HIGH (ref <150)

## 2019-12-06 LAB — MAGNESIUM: Magnesium: 2.1 mg/dL (ref 1.5–2.5)

## 2019-12-06 LAB — TSH: TSH: 1.15 mIU/L (ref 0.40–4.50)

## 2019-12-12 ENCOUNTER — Other Ambulatory Visit: Payer: Self-pay | Admitting: Internal Medicine

## 2019-12-12 ENCOUNTER — Other Ambulatory Visit: Payer: Self-pay | Admitting: Physician Assistant

## 2019-12-12 DIAGNOSIS — I10 Essential (primary) hypertension: Secondary | ICD-10-CM

## 2019-12-12 MED ORDER — MONTELUKAST SODIUM 10 MG PO TABS: ORAL_TABLET | ORAL | 3 refills | Status: DC

## 2019-12-27 ENCOUNTER — Other Ambulatory Visit: Payer: Self-pay | Admitting: Physician Assistant

## 2020-01-09 ENCOUNTER — Telehealth: Payer: Self-pay

## 2020-01-09 NOTE — Telephone Encounter (Signed)
Patient states that she has pain on the right side of her head, has a sinus infection and earache in the right ear. Requesting a prescription.

## 2020-01-10 ENCOUNTER — Other Ambulatory Visit: Payer: Self-pay

## 2020-01-10 ENCOUNTER — Encounter: Payer: Self-pay | Admitting: Physician Assistant

## 2020-01-10 ENCOUNTER — Ambulatory Visit (INDEPENDENT_AMBULATORY_CARE_PROVIDER_SITE_OTHER): Payer: PPO | Admitting: Physician Assistant

## 2020-01-10 VITALS — BP 122/70 | HR 60 | Temp 97.8°F | Wt 126.0 lb

## 2020-01-10 DIAGNOSIS — J329 Chronic sinusitis, unspecified: Secondary | ICD-10-CM

## 2020-01-10 MED ORDER — DEXAMETHASONE 0.5 MG PO TABS: ORAL_TABLET | ORAL | 0 refills | Status: DC

## 2020-01-10 MED ORDER — LEVOFLOXACIN 500 MG PO TABS: 500.0000 mg | ORAL_TABLET | Freq: Every day | ORAL | 0 refills | Status: DC

## 2020-01-10 NOTE — Progress Notes (Signed)
History of Present Illness: 64 y.o. WF with history of recurrent sinuses with sinus issues.  She states that she has been fixing up her dad's house and rental property, has been having sinus issues but has been worse x Tuesday. She has right sinus tenderness, cough with green mucus, teeth pain.  She is on allergy meds, allergra D, has done heating pad. She has recurrent sinus infections.   She has seen ENT, has TMJ and will see Dr. Toy Cookey.   Blood pressure 122/70, pulse 60, temperature 97.8 F (36.6 C), weight 126 lb (57.2 kg), SpO2 99 %.    Medications  Current Outpatient Medications (Endocrine & Metabolic):  .  levothyroxine (SYNTHROID) 50 MCG tablet, TAKE 1/2 TABLET BY MOUTH DAILY  Current Outpatient Medications (Cardiovascular):  .  atenolol (TENORMIN) 50 MG tablet, Take 1 tablet every Morning for BP .  losartan (COZAAR) 50 MG tablet, Take 1 tablet Daily for BP .  rosuvastatin (CRESTOR) 40 MG tablet, Take 1/2 to 1 tablet daily or as directed for Cholesterol  Current Outpatient Medications (Respiratory):  Marland Kitchen  ADVAIR HFA 115-21 MCG/ACT inhaler, INHALE 2 PUFFS BY MOUTH EVERY 12 HOURS 10 TO 15 MINUTES APART .  albuterol (PROAIR HFA) 108 (90 Base) MCG/ACT inhaler, Inhale 2 puffs into the lungs every 6 (six) hours as needed for wheezing or shortness of breath. .  fexofenadine-pseudoephedrine (ALLEGRA-D 12 HOUR) 60-120 MG per tablet, Take 1 tablet by mouth daily. .  fluticasone (FLONASE) 50 MCG/ACT nasal spray, INSTILL 2 SPRAYS IN EACH NOSTRIL EVERY EVENING .  montelukast (SINGULAIR) 10 MG tablet, Take 1 tablet Daily for Allergies  Current Outpatient Medications (Analgesics):  .  meloxicam (MOBIC) 15 MG tablet, Take 1 tablet Daily with Food for Pain & Inflammation .  Rimegepant Sulfate (NURTEC) 75 MG TBDP, Take 75 mg by mouth as needed (migraine).  Current Outpatient Medications (Hematological):  Marland Kitchen  Cyanocobalamin (VITAMIN B-12) 2500 MCG SUBL, Place 1 tablet under the tongue daily.    Current Outpatient Medications (Other):  .  amitriptyline (ELAVIL) 25 MG tablet, Take 1 tablet (25 mg total) by mouth at bedtime. .  Cholecalciferol (VITAMIN D PO), Take 4,000 Units by mouth daily.  Marland Kitchen  CINNAMON PO, Take 1,000 Units by mouth 2 (two) times daily.  .  clonazePAM (KLONOPIN) 0.5 MG tablet, Take 1/2 to 1 tablet at bed time for anxiety. .  cyclobenzaprine (FLEXERIL) 10 MG tablet, Take 10 mg by mouth at bedtime. .  diclofenac sodium (VOLTAREN) 1 % GEL, Apply 4 g topically 4 (four) times daily. .  DULoxetine 40 MG CPEP, Take 40 mg by mouth daily. .  fluocinonide cream (LIDEX) 0.05 %, Apply to rash 2 x / day as needed .  halobetasol (ULTRAVATE) 0.05 % ointment,  .  lamoTRIgine (LAMICTAL) 25 MG tablet, Take 1 tab daily .  Magnesium 250 MG TABS, Take 3 tablets by mouth daily.  Marland Kitchen  nystatin (MYCOSTATIN) 100000 UNIT/ML suspension, Take 1 teaspoon (5ml)  4 x /day  (Swish & Swallow) .  pantoprazole (PROTONIX) 40 MG tablet, TAKE 1 TABLET(40 MG) BY MOUTH TWICE DAILY .  pregabalin (LYRICA) 50 MG capsule, Take 50 mg by mouth at bedtime. Marland Kitchen  PREMARIN vaginal cream, INSERT 1/2 GRAM VAGINALLY VIA APPLICATOR NIGHTLY FOR W EEKS THEN JUST DO TEICE A WEEK .  Zinc 50 MG TABS, Take by mouth daily.  Problem list She has Hyperlipidemia, mixed; Hypertension; Asthma; GERD (gastroesophageal reflux disease); Hypothyroidism; Vitamin D deficiency; Medication management; Prediabetes; Spondylolisthesis at L4-L5 level; Encounter  for Medicare annual wellness exam; Migraine; Depression, major, recurrent, in partial remission ([place]); Degenerative joint disease; and Chronic back pain on their problem list.   Observations/Objective: General Appearance: Well nourished, in no apparent distress. Eyes: PERRLA, EOMs, conjunctiva no swelling or erythema Sinuses: + Frontal/maxillary tenderness ENT/Mouth: Ext aud canals clear, TMs without erythema, bulging.Mouth and nose not examined- patient wearing a facemask Respiratory:  Respiratory effort normal, BS equal bilaterally without rales, rhonchi, wheezing or stridor.  Cardio: RRR with no MRGs. Brisk peripheral pulses without edema.  Abdomen: Soft, + BS,  Non tender, no guarding, rebound, hernias, masses. Lymphatics: Non tender without lymphadenopathy.  Musculoskeletal: Full ROM, 5/5 strength, Normal gait Neuro: Cranial nerves intact. Normal muscle tone, no cerebellar symptoms. Psych: Awake and oriented X 3, normal affect, Insight and Judgment appropriate.    Assessment and Plan:  Recurrent sinus infections Will treat this time -     levofloxacin (LEVAQUIN) 500 MG tablet; Take 1 tablet (500 mg total) by mouth daily. -     dexamethasone (DECADRON) 0.5 MG tablet; 1 tablet PO BID for 3 days, then take 1 tablet PO for 5 days. -     Ambulatory referral to ENT   Vicie Mutters, PA-C

## 2020-01-10 NOTE — Telephone Encounter (Signed)
Patient is scheduling an appointment to be seen

## 2020-01-10 NOTE — Patient Instructions (Addendum)
I'm sorry that you are not feeling well at this time.   [facility] is now requiring appointments for testing, you should be able to request an appointment by going to [email or web address] or by texting "COVID" to [phone].   High risk patient's include  - age 63 or older - Chronic lung disease  - moderate to severe asthma - heart disease - severe obesity or a BMI greater than 40 - hypertension - other underlying poorly controlled chronic health conditions such as diabetes, renal failure, liver disease, immunocompromised  You can stay in contact with [place] during that time by mychart, telephone calls or we are starting virtual office visits with video capability.   Mild symptoms include  -cough  -fever- you do not have to have a fever -chills shaking due to chills muscle pain -headache -sore throat -new loss of taste or smell  - diarrhea - nausea  Severe symptoms that you need to notify us of immediately include: Difficulty breathing, chest discomfort, altered thinking and confusion.    During your quarantine things to do include Monitor your temperature twice a day.  Please do vitamin C 1000mg  twice a day- stop if you have any worsening heart burn or diarrhea.   You can take tylenol for fevers above 101 if you feel uncomfortable. Remember a fever itself is not bad, the tylenol is more for your comfort. See the max of tylenol below.  Please do breathing exercises. Make sure that you are taking deep breaths and trying to walk around the room as much as possible.  Please lay "prone" or on your belly for up to 4 hours a day, you can split this up to 30 minute or 1 hour increments but this helps get oxygen to certain places of your lungs. Take deep breaths while you are lying prone.  Please get on 81 mg of aspirin unless you are unable to tolerate this or have a contraindication.  Please pump your feet like your are pedaling a bike to prevent clots in your legs.   Push fluids with  Gatorade or electrolyte replacement in addition to water, aim for 100-120 oz a day if no contraindications. We can send in an albuterol inhaler and cough medication if you need one.   If you have worsening shortness of breath, unable to complete full sentences, are panting, please contact the office immediately for call 911. Let them know you are being monitored for coronavirus.   To stop home isolation, you will need all three of the statements below.  1) You need to be fever free for 3 days WITHOUT tylenol.  2) Your symptoms such as cough, shortness of breath need to improve.  3) It needs to be at least 10 days since your symptoms first appeared    After you stop home isolation, please continue to wear a mask in public and continue hand washing and contact precautions.    It is vitally important that if you feel that you have an infection that you stay home and away from places where you may spread it to others.  You should self-quarantine for 10 days if you have symptoms that could potentially be coronavirus and avoid contact with people age 63 and older.

## 2020-01-15 DIAGNOSIS — Z20828 Contact with and (suspected) exposure to other viral communicable diseases: Secondary | ICD-10-CM | POA: Diagnosis not present

## 2020-01-15 DIAGNOSIS — Z20822 Contact with and (suspected) exposure to covid-19: Secondary | ICD-10-CM | POA: Diagnosis not present

## 2020-01-29 ENCOUNTER — Ambulatory Visit (INDEPENDENT_AMBULATORY_CARE_PROVIDER_SITE_OTHER): Payer: PPO | Admitting: Psychiatry

## 2020-01-29 ENCOUNTER — Encounter (HOSPITAL_COMMUNITY): Payer: Self-pay | Admitting: Psychiatry

## 2020-01-29 ENCOUNTER — Other Ambulatory Visit: Payer: Self-pay

## 2020-01-29 DIAGNOSIS — F410 Panic disorder [episodic paroxysmal anxiety] without agoraphobia: Secondary | ICD-10-CM

## 2020-01-29 DIAGNOSIS — F411 Generalized anxiety disorder: Secondary | ICD-10-CM

## 2020-01-29 DIAGNOSIS — F331 Major depressive disorder, recurrent, moderate: Secondary | ICD-10-CM | POA: Diagnosis not present

## 2020-01-29 MED ORDER — LAMOTRIGINE 25 MG PO TABS: ORAL_TABLET | ORAL | 0 refills | Status: DC

## 2020-01-29 MED ORDER — AMITRIPTYLINE HCL 25 MG PO TABS: 25.0000 mg | ORAL_TABLET | Freq: Every day | ORAL | 0 refills | Status: DC

## 2020-01-29 MED ORDER — DULOXETINE HCL 40 MG PO CPEP: 40.0000 mg | ORAL_CAPSULE | Freq: Every day | ORAL | 0 refills | Status: DC

## 2020-01-29 MED ORDER — CLONAZEPAM 0.5 MG PO TABS: ORAL_TABLET | ORAL | 1 refills | Status: DC

## 2020-01-29 NOTE — Progress Notes (Signed)
Virtual Visit via Telephone Note  I connected with Sarah Rodriguez on 01/29/20 at 10:00 AM EST by telephone and verified that I am speaking with the correct person using two identifiers.   I discussed the limitations, risks, security and privacy concerns of performing an evaluation and management service by telephone and the availability of in person appointments. I also discussed with the patient that there may be a patient responsible charge related to this service. The patient expressed understanding and agreed to proceed.   History of Present Illness: Patient was evaluated by phone session.  She is now taking Cymbalta 40 mg and she has noticed more good days but continued to feel sometimes sad because she does not have good social network.  She admitted her depression is situational because of family issues.  She do get sad when she think about her deceased father.  However now returning is taking care of her father states.  She is sleeping good.  She denies any crying spells, feeling of hopelessness or worthlessness.  She does not have any major panic attack.  She takes one fourth of Klonopin at bedtime which helps her sleep and anxiety.  She is taking Lamictal, amitriptyline, Cymbalta and Klonopin.  However she is taking low doses and does not want to stop any of them or increase any affect.  She feels accommodation is working very well and she does not want to change.  We have talked about therapy but patient feels that sometimes therapy makes her more depressed and sad.  However she is willing to give a try if she can connect with her therapist.  She lives with her husband.  She denies drinking or using any illegal substances.  Past Psychiatric History:Reviewed H/Odepression and taking antidepressants since 1996. H/Opanic attacks while driving. SawDr. Letta Moynahan and tried Prozac, Paxil, Zoloft, Xanax, Cymbalta and Klonopin. No h/oinpatient treatment or any suicidal  attempt.  Psychiatric Specialty Exam: Physical Exam  Review of Systems  There were no vitals taken for this visit.There is no height or weight on file to calculate BMI.  General Appearance: NA  Eye Contact:  NA  Speech:  Clear and Coherent and Normal Rate  Volume:  Normal  Mood:  Dysphoric  Affect:  NA  Thought Process:  Goal Directed  Orientation:  Full (Time, Place, and Person)  Thought Content:  WDL and Logical  Suicidal Thoughts:  No  Homicidal Thoughts:  No  Memory:  Immediate;   Good Recent;   Good Remote;   Good  Judgement:  Intact  Insight:  Present  Psychomotor Activity:  NA  Concentration:  Concentration: Good and Attention Span: Good  Recall:  Good  Fund of Knowledge:  Good  Language:  Good  Akathisia:  No  Handed:  Right  AIMS (if indicated):     Assets:  Communication Skills Desire for Improvement Resilience Social Support  ADL's:  Intact  Cognition:  WNL  Sleep:   ok        Assessment and Plan: Major depressive disorder, recurrent.  Generalized anxiety disorder.  Panic attack.  Once again we discussed polypharmacy.  Patient does not want to change her medication dosage or discontinue any of them.  She feels the current medicine regimen is working well.  However she agreed to give a try to the therapist because sometimes she do feel lack of support.  I will continue Lamictal 25 mg daily, amitriptyline 25 mg at and Cymbalta 40 mg daily. Klonopin 0.5 mg to  take one fourth as needed for panic attack.  Recommended to call us back if she has any question of any concern.  Follow-up in 3 months.  Follow Up Instructions:    I discussed the assessment and treatment plan with the patient. The patient was provided an opportunity to ask questions and all were answered. The patient agreed with the plan and demonstrated an understanding of the instructions.   The patient was advised to call back or seek an in-person evaluation if the symptoms worsen or if the  condition fails to improve as anticipated.  I provided 20 minutes of non-face-to-face time during this encounter.   Kathlee Nations, MD

## 2020-03-03 DIAGNOSIS — G478 Other sleep disorders: Secondary | ICD-10-CM | POA: Diagnosis not present

## 2020-03-03 DIAGNOSIS — G4763 Sleep related bruxism: Secondary | ICD-10-CM | POA: Diagnosis not present

## 2020-03-03 DIAGNOSIS — R0683 Snoring: Secondary | ICD-10-CM | POA: Diagnosis not present

## 2020-03-11 ENCOUNTER — Encounter: Payer: Self-pay | Admitting: Internal Medicine

## 2020-03-11 ENCOUNTER — Other Ambulatory Visit: Payer: Self-pay

## 2020-03-11 ENCOUNTER — Ambulatory Visit (INDEPENDENT_AMBULATORY_CARE_PROVIDER_SITE_OTHER): Payer: PPO | Admitting: Internal Medicine

## 2020-03-11 VITALS — BP 122/80 | HR 68 | Temp 97.2°F | Resp 16 | Ht 61.5 in | Wt 124.0 lb

## 2020-03-11 DIAGNOSIS — E782 Mixed hyperlipidemia: Secondary | ICD-10-CM

## 2020-03-11 DIAGNOSIS — Z79899 Other long term (current) drug therapy: Secondary | ICD-10-CM | POA: Diagnosis not present

## 2020-03-11 DIAGNOSIS — R7309 Other abnormal glucose: Secondary | ICD-10-CM | POA: Diagnosis not present

## 2020-03-11 DIAGNOSIS — E559 Vitamin D deficiency, unspecified: Secondary | ICD-10-CM

## 2020-03-11 DIAGNOSIS — I10 Essential (primary) hypertension: Secondary | ICD-10-CM | POA: Diagnosis not present

## 2020-03-11 NOTE — Progress Notes (Signed)
History of Present Illness:      This very nice 64 y.o. MWF  presents for 3 month follow up with HTN, HLD, Pre-Diabetes and Vitamin D Deficiency.       Patient is treated for HTN & BP has been controlled at home. Today's BP: 122/80. Patient has had no complaints of any cardiac type chest pain, palpitations, dyspnea / orthopnea / PND, dizziness, claudication, or dependent edema.      Hyperlipidemia is not controlled with diet & Crestor. Patient denies myalgias or other med SE's. Last Lipids were   Lab Results  Component Value Date   CHOL 174 12/05/2019   HDL 38 (L) 12/05/2019   LDLCALC 102 (H) 12/05/2019   TRIG 222 (H) 12/05/2019   CHOLHDL 4.6 12/05/2019   Also, the patient has history of PreDiabetes and has had no symptoms of reactive hypoglycemia, diabetic polys, paresthesias or visual blurring.  Last A1c was near goal:  Lab Results  Component Value Date   HGBA1C 5.7 (H) 09/03/2019       Further, the patient also has history of Vitamin D Deficiency and supplements vitamin D without any suspected side-effects. Last vitamin D was not at goal:  Lab Results  Component Value Date   VD25OH 46 09/03/2019    Current Outpatient Medications on File Prior to Visit  Medication Sig  . ADVAIR HFA 115-21 MCG/ACT inhaler INHALE 2 PUFFS BY MOUTH EVERY 12 HOURS 10 TO 15 MINUTES APART  . albuterol (PROAIR HFA) 108 (90 Base) MCG/ACT inhaler Inhale 2 puffs into the lungs every 6 (six) hours as needed for wheezing or shortness of breath.  Marland Kitchen amitriptyline (ELAVIL) 25 MG tablet Take 1 tablet (25 mg total) by mouth at bedtime.  Marland Kitchen atenolol (TENORMIN) 50 MG tablet Take 1 tablet every Morning for BP  . Cholecalciferol (VITAMIN D PO) Take 4,000 Units by mouth daily.   Marland Kitchen CINNAMON PO Take 1,000 Units by mouth 2 (two) times daily.   . clonazePAM (KLONOPIN) 0.5 MG tablet Take 1/2 to 1 tablet at bed time for anxiety.  . cyclobenzaprine (FLEXERIL) 10 MG tablet Take 10 mg by mouth at bedtime.  .  diclofenac sodium (VOLTAREN) 1 % GEL Apply 4 g topically 4 (four) times daily.  . DULoxetine HCl 40 MG CPEP Take 40 mg by mouth daily.  . fexofenadine-pseudoephedrine (ALLEGRA-D 12 HOUR) 60-120 MG per tablet Take 1 tablet by mouth daily.  . fluocinonide cream (LIDEX) 0.05 % Apply to rash 2 x / day as needed  . fluticasone (FLONASE) 50 MCG/ACT nasal spray INSTILL 2 SPRAYS IN EACH NOSTRIL EVERY EVENING  . halobetasol (ULTRAVATE) 0.05 % ointment   . lamoTRIgine (LAMICTAL) 25 MG tablet Take 1 tab daily  . levothyroxine (SYNTHROID) 50 MCG tablet TAKE 1/2 TABLET BY MOUTH DAILY  . losartan (COZAAR) 50 MG tablet Take 1 tablet Daily for BP  . Magnesium 250 MG TABS Take 3 tablets by mouth daily.   . meloxicam (MOBIC) 15 MG tablet Take 1 tablet Daily with Food for Pain & Inflammation  . montelukast (SINGULAIR) 10 MG tablet Take 1 tablet Daily for Allergies  . nystatin (MYCOSTATIN) 100000 UNIT/ML suspension Take 1 teaspoon (67ml)  4 x /day  (Swish & Swallow)  . pantoprazole (PROTONIX) 40 MG tablet TAKE 1 TABLET(40 MG) BY MOUTH TWICE DAILY  . pregabalin (LYRICA) 50 MG capsule Take 50 mg by mouth at bedtime.  Marland Kitchen PREMARIN vaginal cream INSERT 1/2 GRAM VAGINALLY VIA APPLICATOR NIGHTLY FOR  W EEKS THEN JUST DO TEICE A WEEK  . Rimegepant Sulfate (NURTEC) 75 MG TBDP Take 75 mg by mouth as needed (migraine).  . rosuvastatin (CRESTOR) 40 MG tablet Take 1/2 to 1 tablet daily or as directed for Cholesterol  . Zinc 50 MG TABS Take by mouth daily.   No current facility-administered medications on file prior to visit.   Allergies  Allergen Reactions  . Demerol [Meperidine] Other (See Comments)    REACTION: unknown--itching  . Epinephrine Other (See Comments)    "Sensitivity."  . Niacin And Related Rash   PMHx:   Past Medical History:  Diagnosis Date  . Asthma    Dulera daily  . Chronic back pain    HNP  . Complication of anesthesia    slow to wake up  . Depression    takes Clonazepam nightly  . GERD  (gastroesophageal reflux disease)    takes Protonix daily   . Heart murmur   . History of migraine    takes Relpax daily as needed  . HSV (herpes simplex virus) infection   . Hyperlipidemia    takes Atorvastatin daily  . Hypertension    takes Benicar daily  . Hypothyroidism    takes Synthroid daily  . MVP (mitral valve prolapse)   . Pneumonia    hx of--been many yrs ago  . Seasonal allergies    takes Allegra daily  . Shortness of breath    with exertion    Immunization History  Administered Date(s) Administered  . Influenza Inj Mdck Quad Pf 08/27/2019  . Influenza Inj Mdck Quad With Preservative 09/14/2017, 10/19/2018  . Influenza,inj,quad, With Preservative 11/09/2016  . Influenza-Unspecified 09/18/2013  . PPD Test 04/17/2014  . Pneumococcal-Unspecified 11/30/1995  . Td 08/27/2005, 05/05/2016    Past Surgical History:  Procedure Laterality Date  . ABLATION  2011   Uterine  . buttocks surgery     at age 87 (coccyx repair)  . CERVICAL FUSION  03/2010  . CESAREAN SECTION  1992  . CHOLECYSTECTOMY  2008  . COLONOSCOPY    . DILATION AND CURETTAGE OF UTERUS  2011  . ESOPHAGOGASTRODUODENOSCOPY    . LUMBAR LAMINECTOMY/DECOMPRESSION MICRODISCECTOMY  10/24/2012   Procedure: LUMBAR LAMINECTOMY/DECOMPRESSION MICRODISCECTOMY 1 LEVEL;  Surgeon: Kristeen Miss, MD;  Location: [place] [facility];  Service: Neurosurgery;  Laterality: Right;  Right Lumbar five-Sacral One Microdiskectomy  . right knee arthroscopy     x 3  . TONSILLECTOMY     at age 78    FHx:    Reviewed / unchanged  SHx:    Reviewed / unchanged   Systems Review:  Constitutional: Denies fever, chills, wt changes, headaches, insomnia, fatigue, night sweats, change in appetite. Eyes: Denies redness, blurred vision, diplopia, discharge, itchy, watery eyes.  ENT: Denies discharge, congestion, post nasal drip, epistaxis, sore throat, earache, hearing loss, dental pain, tinnitus, vertigo, sinus pain, snoring.  CV: Denies  chest pain, palpitations, irregular heartbeat, syncope, dyspnea, diaphoresis, orthopnea, PND, claudication or edema. Respiratory: denies cough, dyspnea, DOE, pleurisy, hoarseness, laryngitis, wheezing.  Gastrointestinal: Denies dysphagia, odynophagia, heartburn, reflux, water brash, abdominal pain or cramps, nausea, vomiting, bloating, diarrhea, constipation, hematemesis, melena, hematochezia  or hemorrhoids. Genitourinary: Denies dysuria, frequency, urgency, nocturia, hesitancy, discharge, hematuria or flank pain. Musculoskeletal: Denies arthralgias, myalgias, stiffness, jt. swelling, pain, limping or strain/sprain.  Skin: Denies pruritus, rash, hives, warts, acne, eczema or change in skin lesion(s). Neuro: No weakness, tremor, incoordination, spasms, paresthesia or pain. Psychiatric: Denies confusion, memory loss or sensory loss. Endo:  Denies change in weight, skin or hair change.  Heme/Lymph: No excessive bleeding, bruising or enlarged lymph nodes.  Physical Exam  BP 122/80   Pulse 68   Temp (!) 97.2 F (36.2 C)   Resp 16   Ht 5' 1.5" (1.562 m)   Wt 124 lb (56.2 kg)   BMI 23.05 kg/m   Appears  well nourished, well groomed  and in no distress.  Eyes: PERRLA, EOMs, conjunctiva no swelling or erythema. Sinuses: No frontal/maxillary tenderness ENT/Mouth: EAC's clear, TM's nl w/o erythema, bulging. Nares clear w/o erythema, swelling, exudates. Oropharynx clear without erythema or exudates. Oral hygiene is good. Tongue normal, non obstructing. Hearing intact.  Neck: Supple. Thyroid not palpable. Car 2+/2+ without bruits, nodes or JVD. Chest: Respirations nl with BS clear & equal w/o rales, rhonchi, wheezing or stridor.  Cor: Heart sounds normal w/ regular rate and rhythm without sig. murmurs, gallops, clicks or rubs. Peripheral pulses normal and equal  without edema.  Abdomen: Soft & bowel sounds normal. Non-tender w/o guarding, rebound, hernias, masses or organomegaly.  Lymphatics:  Unremarkable.  Musculoskeletal: Full ROM all peripheral extremities, joint stability, 5/5 strength and normal gait.  Skin: Warm, dry without exposed rashes, lesions or ecchymosis apparent.  Neuro: Cranial nerves intact, reflexes equal bilaterally. Sensory-motor testing grossly intact. Tendon reflexes grossly intact.  Pysch: Alert & oriented x 3.  Insight and judgement nl & appropriate. No ideations.  Assessment and Plan:  - Continue medication, monitor blood pressure at home.  - Continue DASH diet.  Reminder to go to the ER if any CP,  SOB, nausea, dizziness, severe HA, changes vision/speech.  - Continue diet/meds, exercise,& lifestyle modifications.  - Continue monitor periodic cholesterol/liver & renal functions    1. Essential hypertension  - CBC with Differential/Platelet - COMPLETE METABOLIC PANEL WITH GFR - Magnesium - TSH  2. Hyperlipidemia, mixed  - Lipid panel - TSH  3. Abnormal glucose  - Continue diet, exercise  - Lifestyle modifications.  - Monitor appropriate labs.  - Hemoglobin A1c - Insulin, random  4. Vitamin D deficiency  - Continue supplementation.  - VITAMIN D 25 Hydroxyl  5. Medication management  - CBC with Differential/Platelet - COMPLETE METABOLIC PANEL WITH GFR - Magnesium - Lipid panel - TSH - Hemoglobin A1c - Insulin, random - VITAMIN D 25 Hydroxy (Vit-D Deficiency, Fractures)       Discussed  regular exercise, BP monitoring, weight control to achieve/maintain BMI less than 25 and discussed med and SE's. Recommended labs to assess and monitor clinical status with further disposition pending results of labs.  I discussed the assessment and treatment plan with the patient. The patient was provided an opportunity to ask questions and all were answered. The patient agreed with the plan and demonstrated an understanding of the instructions.  I provided over 30 minutes of exam, counseling, chart review and  complex critical decision  making.   Kirtland Bouchard, MD

## 2020-03-11 NOTE — Patient Instructions (Signed)

## 2020-03-12 LAB — CBC WITH DIFFERENTIAL/PLATELET
Absolute Monocytes: 410 cells/uL (ref 200–950)
Basophils Absolute: 29 cells/uL (ref 0–200)
Basophils Relative: 0.4 %
Eosinophils Absolute: 144 cells/uL (ref 15–500)
Eosinophils Relative: 2 %
HCT: 45.5 % — ABNORMAL HIGH (ref 35.0–45.0)
Hemoglobin: 15.3 g/dL (ref 11.7–15.5)
Lymphs Abs: 1879 cells/uL (ref 850–3900)
MCH: 31.6 pg (ref 27.0–33.0)
MCHC: 33.6 g/dL (ref 32.0–36.0)
MCV: 94 fL (ref 80.0–100.0)
MPV: 10.3 fL (ref 7.5–12.5)
Monocytes Relative: 5.7 %
Neutro Abs: 4738 cells/uL (ref 1500–7800)
Neutrophils Relative %: 65.8 %
Platelets: 252 10*3/uL (ref 140–400)
RBC: 4.84 10*6/uL (ref 3.80–5.10)
RDW: 11.8 % (ref 11.0–15.0)
Total Lymphocyte: 26.1 %
WBC: 7.2 10*3/uL (ref 3.8–10.8)

## 2020-03-12 LAB — LIPID PANEL
Cholesterol: 186 mg/dL (ref <200)
HDL: 41 mg/dL — ABNORMAL LOW (ref >=50)
LDL Cholesterol (Calc): 109 mg/dL (calc) — ABNORMAL HIGH
Non-HDL Cholesterol (Calc): 145 mg/dL (calc) — ABNORMAL HIGH (ref <130)
Total CHOL/HDL Ratio: 4.5 (calc) (ref <5.0)
Triglycerides: 250 mg/dL — ABNORMAL HIGH (ref <150)

## 2020-03-12 LAB — COMPLETE METABOLIC PANEL WITH GFR
AG Ratio: 2.1 (calc) (ref 1.0–2.5)
ALT: 20 U/L (ref 6–29)
AST: 21 U/L (ref 10–35)
Albumin: 4.8 g/dL (ref 3.6–5.1)
Alkaline phosphatase (APISO): 72 U/L (ref 37–153)
BUN: 19 mg/dL (ref 7–25)
CO2: 30 mmol/L (ref 20–32)
Calcium: 9.7 mg/dL (ref 8.6–10.4)
Chloride: 103 mmol/L (ref 98–110)
Creat: 0.91 mg/dL (ref 0.50–0.99)
GFR, Est African American: 78 mL/min/{1.73_m2} (ref >=60)
GFR, Est Non African American: 68 mL/min/{1.73_m2} (ref >=60)
Globulin: 2.3 g/dL (calc) (ref 1.9–3.7)
Glucose, Bld: 130 mg/dL — ABNORMAL HIGH (ref 65–99)
Potassium: 4 mmol/L (ref 3.5–5.3)
Sodium: 141 mmol/L (ref 135–146)
Total Bilirubin: 0.5 mg/dL (ref 0.2–1.2)
Total Protein: 7.1 g/dL (ref 6.1–8.1)

## 2020-03-12 LAB — HEMOGLOBIN A1C
Hgb A1c MFr Bld: 5.8 % of total Hgb — ABNORMAL HIGH (ref <5.7)
Mean Plasma Glucose: 120 (calc)
eAG (mmol/L): 6.6 (calc)

## 2020-03-12 LAB — MAGNESIUM: Magnesium: 2.1 mg/dL (ref 1.5–2.5)

## 2020-03-12 LAB — VITAMIN D 25 HYDROXY (VIT D DEFICIENCY, FRACTURES): Vit D, 25-Hydroxy: 75 ng/mL (ref 30–100)

## 2020-03-12 LAB — INSULIN, RANDOM: Insulin: 62 u[IU]/mL — ABNORMAL HIGH

## 2020-03-12 LAB — TSH: TSH: 1.89 mIU/L (ref 0.40–4.50)

## 2020-03-24 DIAGNOSIS — G4733 Obstructive sleep apnea (adult) (pediatric): Secondary | ICD-10-CM | POA: Diagnosis not present

## 2020-04-01 ENCOUNTER — Other Ambulatory Visit: Payer: Self-pay | Admitting: Internal Medicine

## 2020-04-01 DIAGNOSIS — J301 Allergic rhinitis due to pollen: Secondary | ICD-10-CM

## 2020-04-01 MED ORDER — FLUTICASONE PROPIONATE 50 MCG/ACT NA SUSP: NASAL | 3 refills | Status: DC

## 2020-04-02 ENCOUNTER — Ambulatory Visit (INDEPENDENT_AMBULATORY_CARE_PROVIDER_SITE_OTHER): Payer: PPO | Admitting: Physician Assistant

## 2020-04-02 ENCOUNTER — Other Ambulatory Visit: Payer: Self-pay

## 2020-04-02 ENCOUNTER — Encounter: Payer: Self-pay | Admitting: Physician Assistant

## 2020-04-02 VITALS — BP 120/80 | HR 62 | Temp 97.6°F | Wt 123.0 lb

## 2020-04-02 DIAGNOSIS — R102 Pelvic and perineal pain: Secondary | ICD-10-CM | POA: Diagnosis not present

## 2020-04-02 NOTE — Patient Instructions (Signed)
INFORMATION ABOUT YOUR XRAY  Can walk into [address] for an Insurance account manager. They will have the order and take you back. You do not any paper work, I should get the result back today or tomorrow. This order is good for a year.  Can call [phone] to schedule an appointment if you wish.   Will do diclofenac 75 mg twice daily with food for 1-2 weeks Do not take the mobic with it You can take tylenol (500mg ) or tylenol arthritis (650mg ) with the meloxicam/antiinflammatories. The max you can take of tylenol a day is 3000mg  daily, this is a max of 6 pills a day of the regular tyelnol (500mg ) or a max of 4 a day of the tylenol arthritis (650mg ) as long as no other medications you are taking contain tylenol.   this can cause inflammation in your stomach and can cause ulcers or bleeding, this will look like black tarry stools Make sure you taking it with food Try not to take it daily, take AS needed Can take with zantac   The patient was advised to call immediately if she has any concerning symptoms in the interval. The patient voices understanding of current treatment options and is in agreement with the current care plan.The patient knows to call the clinic with any problems, questions or concerns or go to the ER if any further progression of symptoms.

## 2020-04-02 NOTE — Progress Notes (Signed)
   Subjective:    Patient ID: Sarah Rodriguez, female    DOB: 1956/10/19, 64 y.o.   MRN: [id number]  HPI 64 y.o. WF presents with left thigh pain x 3 weeks.  She states 3 weeks ago she was sitting on a chair and thought there was a screw or rock on the chair but she states she noticed that it was in different chairs.   Described left sided pelvic/buttock pain, feels like a pressure like sitting on a stone. Better with sitting on a cushion. No pain down her legs. She had a fall but after she had the pain- just a trip.  Just saw GYN and states no prolapse.  No fever, no chills. No urinary issues.  S/p lumbar fusion 2016.   Ct back 03/2013  Review of Systems     Objective:   Physical Exam AB normal, non tender, good BS Skin: no rash Really good ROM of left hip without pain.  Pin point tenderness at left buttocks, left of labia majora. No abscess, no redness.  No hemorrhoid, negative hemoccult.  Normal internal vaginal exam, no bartholin cyst inflammation.      Assessment & Plan:  Shelbea was seen today for acute visit.  Diagnoses and all orders for this visit:  Pelvic pain -     DG Pelvis 1-2 Views; Future ? pirformis insertion? If negative suggest referral to ortho

## 2020-04-03 ENCOUNTER — Telehealth: Payer: Self-pay | Admitting: Physician Assistant

## 2020-04-03 ENCOUNTER — Ambulatory Visit
Admission: RE | Admit: 2020-04-03 | Discharge: 2020-04-03 | Disposition: A | Discharge status: Home / Self Care | Payer: PPO | Source: Ambulatory Visit | Attending: Physician Assistant | Admitting: Physician Assistant

## 2020-04-03 DIAGNOSIS — R102 Pelvic and perineal pain: Secondary | ICD-10-CM | POA: Diagnosis not present

## 2020-04-03 MED ORDER — DICLOFENAC SODIUM 75 MG PO TBEC: 75.0000 mg | DELAYED_RELEASE_TABLET | Freq: Two times a day (BID) | ORAL | 0 refills | Status: DC

## 2020-04-03 NOTE — Telephone Encounter (Signed)
Sent into the pharamcy

## 2020-04-17 ENCOUNTER — Ambulatory Visit: Payer: PPO | Admitting: Physician Assistant

## 2020-04-18 NOTE — Progress Notes (Signed)
   Subjective:    Patient ID: Sarah Rodriguez, female    DOB: 1956-02-04, 64 y.o.   MRN: [id number]  HPI 64 y.o. WF presents with left thigh pain x 3 weeks.  She states 3 weeks ago she was sitting on a chair and thought there was a screw or rock on the chair but she states she noticed that it was in different chairs.   Described left sided pelvic/buttock pain, feels like a pressure like sitting on a stone. Better with sitting on a cushion. No pain down her legs. She had a fall but after she had the pain- just a trip.  She states the diclofenac is less helpful, will go back to mobic.  Just saw GYN and states no prolapse.  No fever, no chills. No urinary issues.  S/p lumbar fusion 2016.  Ct back 03/2013  Review of Systems SEE HPI    Objective:   Physical Exam AB normal, non tender, good BS Skin: no rash Really good ROM of left hip without pain.  Pin point tenderness at left buttocks, left of labia majora. No abscess, no redness.  No hemorrhoid, negative hemoccult.  Normal internal vaginal exam, no bartholin cyst inflammation.      Assessment & Plan:  Priyana was seen today for acute visit.  Diagnoses and all orders for this visit:  Left leg pain Normal pelvic Xray ? pirformis insertion? Suggest ortho and PT referral- she will go see her orthopedic doctor

## 2020-04-22 ENCOUNTER — Encounter: Payer: Self-pay | Admitting: Physician Assistant

## 2020-04-22 ENCOUNTER — Other Ambulatory Visit: Payer: Self-pay

## 2020-04-22 ENCOUNTER — Ambulatory Visit (INDEPENDENT_AMBULATORY_CARE_PROVIDER_SITE_OTHER): Payer: PPO | Admitting: Physician Assistant

## 2020-04-22 VITALS — BP 120/70 | HR 64 | Temp 97.2°F | Wt 120.0 lb

## 2020-04-22 DIAGNOSIS — R102 Pelvic and perineal pain: Secondary | ICD-10-CM

## 2020-04-22 NOTE — Patient Instructions (Addendum)
Stop the diclofenac Start the mobic 15 mg, can try to take daily.  Mobic is an antiinflammatory It helps pain, can not take with aleve, or ibuprofen You can take tylenol (500mg ) or tylenol arthritis (650mg ) with the meloxicam/antiinflammatories. The max you can take of tylenol a day is 3000mg  daily, this is a max of 6 pills a day of the regular tyelnol (500mg ) or a max of 4 a day of the tylenol arthritis (650mg ) as long as no other medications you are taking contain tylenol.   Mobic can cause inflammation in your stomach and can cause ulcers or bleeding, this will look like black tarry stools Make sure you take your mobic with food Try not to take it daily, take AS needed Can take with pepcid  Suggest seeing ortho/Dr. Berenice Primas and physical therapy  Check out [email or web address]  You can find a registered dietitian near you. At the bottom of the website you can search for a dietitian and there is a filter option by speciality as well.  A dietitian has their master's and are an expert at nutrition.  We are starting you on Metformin to prevent or treat diabetes.  Metformin does NOT cause low blood sugars.   In order to create energy your cells need insulin and sugar but sometime your cells do not accept the insulin and this can cause increased sugars and decreased energy.   The Metformin helps your cells accept insulin and the sugar this help: 1) increase your energy  2) weight loss.    The two most common side effects are nausea and diarrhea, follow these rules to avoid it but these symptoms get better with time on the medication.    ALSO You can take imodium per box instructions when starting metformin if needed.   Rules of metformin: 1) start out slow with only one pill daily. Our goal for you is 4 pills a day or 2000mg  total.  2) take with your largest meal. 3) Take with least amount of carbs.   Call if you have any problems.    Piriformis Syndrome Rehab Ask your health care provider  which exercises are safe for you. Do exercises exactly as told by your health care provider and adjust them as directed. It is normal to feel mild stretching, pulling, tightness, or discomfort as you do these exercises. Stop right away if you feel sudden pain or your pain gets worse. Do not begin these exercises until told by your health care provider. Stretching and range-of-motion exercises These exercises warm up your muscles and joints and improve the movement and flexibility of your hip and pelvis. The exercises also help to relieve pain, numbness, and tingling. Hip rotation This is an exercise in which you lie on your back and stretch the muscles that rotate your hip (hip rotators) to stretch your buttocks. 1. Lie on your back on a firm surface. 2. Pull your left / right knee toward your same shoulder with your left / right hand until your knee is pointing toward the ceiling. Hold your left / right ankle with your other hand. 3. Keeping your knee steady, gently pull your left / right ankle toward your other shoulder until you feel a stretch in your buttocks. 4. Hold this position for __________ seconds. Repeat __________ times. Complete this exercise __________ times a day. Hip extensor This is an exercise in which you lie on your back and pull your knee to your chest. 1. Lie on your back on a firm  surface. Both of your legs should be straight. 2. Pull your left / right knee to your chest. Hold your leg in this position by holding onto the back of your thigh or the front of your knee. 3. Hold this position for __________ seconds. 4. Slowly return to the starting position. Repeat __________ times. Complete this exercise __________ times a day. Strengthening exercises These exercises build strength and endurance in your hip and thigh muscles. Endurance is the ability to use your muscles for a long time, even after they get tired. Straight leg raises, side-lying This exercise strengthens the  muscles that rotate the leg at the hip and move it away from your body (hip abductors). 1. Lie on your side with your left / right leg in the top position. Lie so your head, shoulder, knee, and hip line up. Bend your bottom knee to help you balance. 2. Lift your top leg 4-6 inches (10-15 cm) while keeping your toes pointed straight ahead. 3. Hold this position for __________ seconds. 4. Slowly lower your leg to the starting position. 5. Let your muscles relax completely after each repetition. Repeat __________ times. Complete this exercise __________ times a day. Hip abduction and rotation This is sometimes called quadruped (on hands and knees) exercises. 1. Get on your hands and knees on a firm, lightly padded surface. Your hands should be directly below your shoulders, and your knees should be directly below your hips. 2. Lift your left / right knee out to the side. Keep your knee bent. Do not twist your body. 3. Hold this position for __________ seconds. 4. Slowly lower your leg. Repeat __________ times. Complete this exercise __________ times a day. Straight leg raises, face-down This exercise stretches the muscles that move your hips away from the front of the pelvis (hip extensors). 1. Lie on your abdomen on a bed or a firm surface with a pillow under your hips. 2. Squeeze your buttocks muscles and lift your left / right leg about 4-6 inches (10-15 cm) off the bed. Do not let your back arch. 3. Hold this position for __________ seconds. 4. Slowly lower your leg to the starting position. 5. Let your muscles relax completely after each repetition. Repeat __________ times. Complete this exercise __________ times a day. This information is not intended to replace advice given to you by your health care provider. Make sure you discuss any questions you have with your health care provider. Document Revised: 03/08/2019 Document Reviewed: 09/07/2018 Elsevier Patient Education  [place].

## 2020-04-24 DIAGNOSIS — M25561 Pain in right knee: Secondary | ICD-10-CM | POA: Diagnosis not present

## 2020-04-24 DIAGNOSIS — M170 Bilateral primary osteoarthritis of knee: Secondary | ICD-10-CM | POA: Diagnosis not present

## 2020-04-24 DIAGNOSIS — M25562 Pain in left knee: Secondary | ICD-10-CM | POA: Diagnosis not present

## 2020-04-24 DIAGNOSIS — M25552 Pain in left hip: Secondary | ICD-10-CM | POA: Diagnosis not present

## 2020-04-30 ENCOUNTER — Other Ambulatory Visit (HOSPITAL_COMMUNITY): Payer: Self-pay | Admitting: *Deleted

## 2020-04-30 DIAGNOSIS — F411 Generalized anxiety disorder: Secondary | ICD-10-CM

## 2020-04-30 DIAGNOSIS — F331 Major depressive disorder, recurrent, moderate: Secondary | ICD-10-CM

## 2020-04-30 DIAGNOSIS — G4733 Obstructive sleep apnea (adult) (pediatric): Secondary | ICD-10-CM | POA: Diagnosis not present

## 2020-04-30 MED ORDER — DULOXETINE HCL 40 MG PO CPEP: 40.0000 mg | ORAL_CAPSULE | Freq: Every day | ORAL | 0 refills | Status: DC

## 2020-05-01 ENCOUNTER — Telehealth (INDEPENDENT_AMBULATORY_CARE_PROVIDER_SITE_OTHER): Payer: PPO | Admitting: Psychiatry

## 2020-05-01 ENCOUNTER — Encounter (HOSPITAL_COMMUNITY): Payer: Self-pay | Admitting: Psychiatry

## 2020-05-01 ENCOUNTER — Other Ambulatory Visit: Payer: Self-pay

## 2020-05-01 DIAGNOSIS — F331 Major depressive disorder, recurrent, moderate: Secondary | ICD-10-CM

## 2020-05-01 DIAGNOSIS — F411 Generalized anxiety disorder: Secondary | ICD-10-CM

## 2020-05-01 DIAGNOSIS — F410 Panic disorder [episodic paroxysmal anxiety] without agoraphobia: Secondary | ICD-10-CM | POA: Diagnosis not present

## 2020-05-01 MED ORDER — LAMOTRIGINE 25 MG PO TABS: ORAL_TABLET | ORAL | 0 refills | Status: DC

## 2020-05-01 MED ORDER — CLONAZEPAM 0.5 MG PO TABS: ORAL_TABLET | ORAL | 0 refills | Status: DC

## 2020-05-01 MED ORDER — AMITRIPTYLINE HCL 25 MG PO TABS: 25.0000 mg | ORAL_TABLET | Freq: Every day | ORAL | 0 refills | Status: DC

## 2020-05-01 NOTE — Progress Notes (Signed)
Virtual Visit via Telephone Note  I connected with Sarah Rodriguez on 05/01/20 at 10:40 AM EDT by telephone and verified that I am speaking with the correct person using two identifiers.   I discussed the limitations, risks, security and privacy concerns of performing an evaluation and management service by telephone and the availability of in person appointments. I also discussed with the patient that there may be a patient responsible charge related to this service. The patient expressed understanding and agreed to proceed.  Patient location; [facility] Provider location; home office  History of Present Illness: Patient is evaluated by phone session.  Currently she is visiting [place], [place].  She has appointment with her attorney to settle the property of her deceased father.  Patient is anxious because her brother will be also better.  Patient is hoping that meeting goal well and she can come back to [facility].  She is sleeping okay.  She denies any major panic attack but still takes Klonopin one fourth as needed when she is very anxious and nervous.  She is taking Lamictal, amitriptyline, Cymbalta.  She has no rash or any itching.  Recently she had blood work and her hemoglobin A1c is 5.8.  She reported having baily weight and her physician recommend to try Adderall/Topamax combination to lose that weight.  She like to check with [place] before she start taking the medication.  She knows the side effects of stimulants.  Overall she feels her depression anxiety and panic attack is stable.  She has no rash or any itching.  She has more good days since he started the Lamictal.  She is reluctant to go up on the dose since things are going well.   Past Psychiatric History:Reviewed H/Odepression and taking antidepressants since 1996. H/Opanic attacks while driving. SawDr. Letta Moynahan and tried Prozac, Paxil, Zoloft, Xanax, Cymbalta and Klonopin. No  h/oinpatient treatment or any suicidal attempt.   Recent Results (from the past 2160 hour(s))  CBC with Differential/Platelet     Status: Abnormal   Collection Time: 03/11/20 11:47 AM  Result Value Ref Range   WBC 7.2 3.8 - 10.8 Thousand/uL   RBC 4.84 3.80 - 5.10 Million/uL   Hemoglobin 15.3 11.7 - 15.5 g/dL   HCT 45.5 (H) 35.0 - 45.0 %   MCV 94.0 80.0 - 100.0 fL   MCH 31.6 27.0 - 33.0 pg   MCHC 33.6 32.0 - 36.0 g/dL   RDW 11.8 11.0 - 15.0 %   Platelets 252 140 - 400 Thousand/uL   MPV 10.3 7.5 - 12.5 fL   Neutro Abs 4,738 1,500 - 7,800 cells/uL   Lymphs Abs 1,879 850 - 3,900 cells/uL   Absolute Monocytes 410 200 - 950 cells/uL   Eosinophils Absolute 144 15 - 500 cells/uL   Basophils Absolute 29 0 - 200 cells/uL   Neutrophils Relative % 65.8 %   Total Lymphocyte 26.1 %   Monocytes Relative 5.7 %   Eosinophils Relative 2.0 %   Basophils Relative 0.4 %  COMPLETE METABOLIC PANEL WITH GFR     Status: Abnormal   Collection Time: 03/11/20 11:47 AM  Result Value Ref Range   Glucose, Bld 130 (H) 65 - 99 mg/dL    Comment: .            Fasting reference interval . For someone without known diabetes, a glucose value >125 mg/dL indicates that they may have diabetes and this should be confirmed with a follow-up test. .  BUN 19 7 - 25 mg/dL   Creat 0.91 0.50 - 0.99 mg/dL    Comment: For patients >55 years of age, the reference limit for Creatinine is approximately 13% higher for people identified as African-American. .    GFR, Est Non African American 68 > OR = 60 mL/min/1.22m2   GFR, Est African American 78 > OR = 60 mL/min/1.22m2   BUN/Creatinine Ratio NOT APPLICABLE 6 - 22 (calc)   Sodium 141 135 - 146 mmol/L   Potassium 4.0 3.5 - 5.3 mmol/L   Chloride 103 98 - 110 mmol/L   CO2 30 20 - 32 mmol/L   Calcium 9.7 8.6 - 10.4 mg/dL   Total Protein 7.1 6.1 - 8.1 g/dL   Albumin 4.8 3.6 - 5.1 g/dL   Globulin 2.3 1.9 - 3.7 g/dL (calc)   AG Ratio 2.1 1.0 - 2.5 (calc)   Total  Bilirubin 0.5 0.2 - 1.2 mg/dL   Alkaline phosphatase (APISO) 72 37 - 153 U/L   AST 21 10 - 35 U/L   ALT 20 6 - 29 U/L  Magnesium     Status: None   Collection Time: 03/11/20 11:47 AM  Result Value Ref Range   Magnesium 2.1 1.5 - 2.5 mg/dL  Lipid panel     Status: Abnormal   Collection Time: 03/11/20 11:47 AM  Result Value Ref Range   Cholesterol 186 <200 mg/dL   HDL 41 (L) > OR = 50 mg/dL   Triglycerides 250 (H) <150 mg/dL    Comment: . If a non-fasting specimen was collected, consider repeat triglyceride testing on a fasting specimen if clinically indicated.  Yates Decamp et al. J. of Clin. Lipidol. [id number]. Marland Kitchen    LDL Cholesterol (Calc) 109 (H) mg/dL (calc)    Comment: Reference range: <100 . Desirable range <100 mg/dL for primary prevention;   <70 mg/dL for patients with CHD or diabetic patients  with > or = 2 CHD risk factors. Marland Kitchen LDL-C is now calculated using the Martin-Hopkins  calculation, which is a validated novel method providing  better accuracy than the Friedewald equation in the  estimation of LDL-C.  Cresenciano Genre et al. Annamaria Helling. [id number]): 2061-2068  (http://education.[facility].com/faq/FAQ164)    Total CHOL/HDL Ratio 4.5 <5.0 (calc)   Non-HDL Cholesterol (Calc) 145 (H) <130 mg/dL (calc)    Comment: For patients with diabetes plus 1 major ASCVD risk  factor, treating to a non-HDL-C goal of <100 mg/dL  (LDL-C of <70 mg/dL) is considered a therapeutic  option.   TSH     Status: None   Collection Time: 03/11/20 11:47 AM  Result Value Ref Range   TSH 1.89 0.40 - 4.50 mIU/L  Hemoglobin A1c     Status: Abnormal   Collection Time: 03/11/20 11:47 AM  Result Value Ref Range   Hgb A1c MFr Bld 5.8 (H) <5.7 % of total Hgb    Comment: For someone without known diabetes, a hemoglobin  A1c value between 5.7% and 6.4% is consistent with prediabetes and should be confirmed with a  follow-up test. . For someone with known diabetes, a value <7% indicates that  their diabetes is well controlled. A1c targets should be individualized based on duration of diabetes, age, comorbid conditions, and other considerations. . This assay result is consistent with an increased risk of diabetes. . Currently, no consensus exists regarding use of hemoglobin A1c for diagnosis of diabetes for children. .    Mean Plasma Glucose 120 (calc)   eAG (mmol/L) 6.6 (calc)  Insulin,  random     Status: Abnormal   Collection Time: 03/11/20 11:47 AM  Result Value Ref Range   Insulin 62.0 (H) uIU/mL    Comment:      Reference Range  < or = 19.6 .      Risk:      Optimal          < or = 19.6      Moderate         NA      High             >19.6 .      Adult cardiovascular event risk category      cut points (optimal, moderate, high)      are based on [employer] population      data from 10/2010. . This insulin assay shows strong cross-reactivity for some insulin analogs (lispro, aspart, and glargine) and much lower cross-reactivity with others (detemir, glulisine).   VITAMIN D 25 Hydroxy (Vit-D Deficiency, Fractures)     Status: None   Collection Time: 03/11/20 11:47 AM  Result Value Ref Range   Vit D, 25-Hydroxy 75 30 - 100 ng/mL    Comment: Vitamin D Status         25-OH Vitamin D: . Deficiency:                    <20 ng/mL Insufficiency:             20 - 29 ng/mL Optimal:                 > or = 30 ng/mL . For 25-OH Vitamin D testing on patients on  D2-supplementation and patients for whom quantitation  of D2 and D3 fractions is required, the QuestAssureD(TM) 25-OH VIT D, (D2,D3), LC/MS/MS is recommended: order  code [phone] (patients >48yrs). See Note 1 . Note 1 . For additional information, please refer to  http://education.[facility].com/faq/FAQ199  (This link is being provided for informational/ educational purposes only.)       Psychiatric Specialty Exam: Physical Exam  Review of Systems  Weight 120 lb (54.4 kg).There is no  height or weight on file to calculate BMI.  General Appearance: NA  Eye Contact:  NA  Speech:  Clear and Coherent and Normal Rate  Volume:  Normal  Mood:  Anxious  Affect:  NA  Thought Process:  Goal Directed  Orientation:  Full (Time, Place, and Person)  Thought Content:  WDL  Suicidal Thoughts:  No  Homicidal Thoughts:  No  Memory:  Immediate;   Good Recent;   Good Remote;   Good  Judgement:  Good  Insight:  Good  Psychomotor Activity:  NA  Concentration:  Concentration: Good and Attention Span: Good  Recall:  Good  Fund of Knowledge:  Good  Language:  Good  Akathisia:  No  Handed:  Right  AIMS (if indicated):     Assets:  Communication Skills Desire for Improvement Housing Resilience Social Support Transportation  ADL's:  Intact  Cognition:  WNL  Sleep:   ok      Assessment and Plan: Major depressive disorder, recurrent.  Generalized anxiety disorder.  Panic attack.  I review her blood work results.  Her hemoglobin A1c is 5.8 and her insulin level is high.  We talked about using stimulant/Topamax to lose baily weight.  We discussed possible side effects including insomnia, worsening of anxiety with the stimulant.  She agreed and she decided not to take  the stimulants but agreed to consult with the dietitian and nutritionist to lose her bailey weight.  She does not want to change medication.  She like to continue Lamictal 25 mg daily, amitriptyline 25 mg at bedtime, Cymbalta 40 mg daily and Klonopin 0.5 mg to take one fourth as needed for severe panic attack.  I discussed medication side effects and benefits.  Recommended to call us back if she has any question or any concern.  Follow-up in 3 months.  Follow Up Instructions:    I discussed the assessment and treatment plan with the patient. The patient was provided an opportunity to ask questions and all were answered. The patient agreed with the plan and demonstrated an understanding of the instructions.   The  patient was advised to call back or seek an in-person evaluation if the symptoms worsen or if the condition fails to improve as anticipated.  I provided 20 minutes of non-face-to-face time during this encounter.   Kathlee Nations, MD

## 2020-05-27 DIAGNOSIS — M25552 Pain in left hip: Secondary | ICD-10-CM | POA: Diagnosis not present

## 2020-05-27 DIAGNOSIS — M25561 Pain in right knee: Secondary | ICD-10-CM | POA: Diagnosis not present

## 2020-06-10 ENCOUNTER — Ambulatory Visit: Payer: PPO | Admitting: Adult Health

## 2020-06-11 NOTE — Progress Notes (Signed)
3 MONTH FOLLOW UP  Assessment/plan:   Essential hypertension Continue medication Monitor blood pressure at home; call if consistently over 130/80 Continue DASH diet.   Reminder to go to the ER if any CP, SOB, nausea, dizziness, severe HA, changes vision/speech, left arm numbness and tingling and jaw pain.  Uncomplicated asthma, unspecified asthma severity, unspecified whether persistent Well controlled on inhalers without recent flare  Gastroesophageal reflux disease, esophagitis presence not specified Well managed on current medications Discussed diet, avoiding triggers and other lifestyle changes  Hypothyroidism, unspecified typ continue medications the same pending lab results reminded to take on an empty stomach 30-52mins before food.  check TSH level  Vitamin D deficiency At goal at recent check; continue to recommend supplementation Defer vitamin D level  Prediabetes Discussed disease and risks Discussed diet/exercise, weight management  Check A1C q56m; defer today; monitor serum glucose   Medication management CBC, CMP/GFR  Hyperlipidemia, mixed Continue medications: rosuvastatin  - may try 20 mg 3-4 days a week for tolerance LDL goal <100 Continue low cholesterol diet and exercise.  Check lipid panel.   Depression, major, recurrent, in partial remission ([place]) Followed by Dr. Adele Schilder Continue medications, reminded to limit benzo use to <5 days a week if possible to avoid dependence  Lifestyle discussed: diet/exerise, sleep hygiene, stress management, hydration  BMI 22, adult Continue to recommend diet heavy in fruits and veggies and low in animal meats, cheeses, and dairy products, appropriate calorie intake Discuss exercise recommendations routinely Continue to monitor weight at each visit   Over 40 minutes of exam, counseling, chart review and critical decision making was performed Future Appointments  Date Time Provider [place]  07/29/2020  11:20 AM Arfeen, Arlyce Harman, MD [facility] None  09/24/2020  2:00 PM Unk Pinto, MD [facility] None  12/04/2020 11:00 AM Liane Comber, NP [facility] None      Subjective:  Sarah Rodriguez is a 64 y.o. female who presents for 3 month follow up. Patient has GERD controlled on her meds.   She was referred to Dr. Augustina Mood for TMJ who found a mild sleep apnea and was recommended an oral device, but this caused two caps to break, has a repair scheduled then plans to resume using. In the interim TMJ is flaring back up, reqeusts flexeril refill which has been very helpful.   She also has Chronic Pain Syndrome consequent of Cx DDD and Cx HNP surg in 2011 and then in 2013, she underwent L5/S1 Surg by Dr Ellene Route and lastly in Nov 2016, she had a Lumbar fusion. She is able to control her pain on lyrica and has avoided Opioids. Does have unsteady gait, has completed PT.   She has hx of migraines; reports rare, typically more sinus. Typically does well with 1/2 tab excedrine. She does have nurtec samples but hasn't needed to use.    She has history of asthma/allergies, states consistently has cough. She has had some sinus congestion/left side worse. Has seen ENT, flonase recommended.    she has a diagnosis of depression, follows with Dr. Adele Schilder currently fairly controlled on lamictal, cymbalta and amitryptiline, and currently takes klonopin 0.25-0.5 mg BID PRN anxiety and panic, reports symptoms are well controlled on current regimen. she takes 0.125 mg of klonopin at night for sleep, uses during the day if in a car.   BMI is Body mass index is 22.46 kg/m., she admits needs to do better on diet and exercise. Not exercising as much with cold weather, tries to go  up and down strairs more often in home. Wt Readings from Last 3 Encounters:  06/12/20 120 lb 12.8 oz (54.8 kg)  04/22/20 120 lb (54.4 kg)  04/02/20 123 lb (55.8 kg)   . Her blood pressure has been controlled at home, today their BP is BP:  120/68 She does workout. She denies chest pain, shortness of breath, dizziness.   She is on cholesterol medication (rosuvastatin 40 mg twice weekly, had problems with myalgias taking daily) and denies myalgias. Her cholesterol is not at goal. The cholesterol last visit was:   Lab Results  Component Value Date   CHOL 186 03/11/2020   HDL 41 (L) 03/11/2020   LDLCALC 109 (H) 03/11/2020   TRIG 250 (H) 03/11/2020   CHOLHDL 4.5 03/11/2020    She has been working on diet and exercise for prediabetes, and denies foot ulcerations, increased appetite, nausea, polydipsia, polyuria, visual disturbances, vomiting and weight loss. Last A1C in the office was:  Lab Results  Component Value Date   HGBA1C 5.8 (H) 03/11/2020   She is on thyroid medication. Her medication was not changed last visit.   Lab Results  Component Value Date   TSH 1.89 03/11/2020   Last GFR: Lab Results  Component Value Date   GFRNONAA 68 03/11/2020   Patient is on Vitamin D supplement, taking 4000 IU daily,  Lab Results  Component Value Date   VD25OH 24 03/11/2020      Medication Review: Current Outpatient Medications on File Prior to Visit  Medication Sig Dispense Refill  . ADVAIR HFA 115-21 MCG/ACT inhaler INHALE 2 PUFFS BY MOUTH EVERY 12 HOURS 10 TO 15 MINUTES APART 36 g 0  . albuterol (PROAIR HFA) 108 (90 Base) MCG/ACT inhaler Inhale 2 puffs into the lungs every 6 (six) hours as needed for wheezing or shortness of breath. 8 g 1  . amitriptyline (ELAVIL) 25 MG tablet Take 1 tablet (25 mg total) by mouth at bedtime. 90 tablet 0  . atenolol (TENORMIN) 50 MG tablet Take 1 tablet every Morning for BP 90 tablet 3  . Cholecalciferol (VITAMIN D PO) Take 4,000 Units by mouth daily.     Marland Kitchen CINNAMON PO Take 1,000 Units by mouth 2 (two) times daily.     . clonazePAM (KLONOPIN) 0.5 MG tablet Take 1/2 to 1 tablet as needed for severe anxiety. 20 tablet 0  . diclofenac sodium (VOLTAREN) 1 % GEL Apply 4 g topically 4 (four)  times daily. 100 g 3  . DULoxetine HCl 40 MG CPEP Take 40 mg by mouth daily. 90 capsule 0  . fexofenadine-pseudoephedrine (ALLEGRA-D 12 HOUR) 60-120 MG per tablet Take 1 tablet by mouth daily.    . fluocinonide cream (LIDEX) 0.05 % Apply to rash 2 x / day as needed 60 g 3  . fluticasone (FLONASE) 50 MCG/ACT nasal spray INSTILL 2 SPRAYS IN EACH NOSTRIL EVERY EVENING 48 g 3  . halobetasol (ULTRAVATE) 0.05 % ointment   3  . lamoTRIgine (LAMICTAL) 25 MG tablet Take 1 tab daily 90 tablet 0  . levothyroxine (SYNTHROID) 50 MCG tablet TAKE 1/2 TABLET BY MOUTH DAILY 90 tablet 1  . losartan (COZAAR) 50 MG tablet Take 1 tablet Daily for BP 90 tablet 3  . Magnesium 250 MG TABS Take 3 tablets by mouth daily.     . meloxicam (MOBIC) 15 MG tablet Take 1 tablet Daily with Food for Pain & Inflammation 90 tablet 3  . montelukast (SINGULAIR) 10 MG tablet Take 1 tablet Daily  for Allergies 90 tablet 3  . nystatin (MYCOSTATIN) 100000 UNIT/ML suspension Take 1 teaspoon (9ml)  4 x /day  (Swish & Swallow) 473 mL 1  . pantoprazole (PROTONIX) 40 MG tablet TAKE 1 TABLET(40 MG) BY MOUTH TWICE DAILY 180 tablet 1  . pregabalin (LYRICA) 50 MG capsule Take 50 mg by mouth at bedtime.    Marland Kitchen PREMARIN vaginal cream INSERT 1/2 GRAM VAGINALLY VIA APPLICATOR NIGHTLY FOR W EEKS THEN JUST DO TEICE A WEEK  11  . Rimegepant Sulfate (NURTEC) 75 MG TBDP Take 75 mg by mouth as needed (migraine). 2 tablet 0  . rosuvastatin (CRESTOR) 40 MG tablet Take 1/2 to 1 tablet daily or as directed for Cholesterol 90 tablet 1  . Zinc 50 MG TABS Take by mouth daily.    . cyclobenzaprine (FLEXERIL) 10 MG tablet Take 10 mg by mouth at bedtime. (Patient not taking: Reported on 06/12/2020)     No current facility-administered medications on file prior to visit.    Allergies  Allergen Reactions  . Demerol [Meperidine] Other (See Comments)    REACTION: unknown--itching  . Epinephrine Other (See Comments)    "Sensitivity."  . Niacin And Related Rash     Current Problems (verified) Patient Active Problem List   Diagnosis Date Noted  . Sleep apnea 06/12/2020  . Chronic back pain 12/21/2017  . Depression, major, recurrent, in partial remission ([place]) 08/05/2016  . Degenerative joint disease 08/05/2016  . Migraine 03/03/2016  . Encounter for Medicare annual wellness exam 02/16/2016  . Spondylolisthesis at L4-L5 level 10/27/2015  . Prediabetes 12/12/2014  . Vitamin D deficiency 04/17/2014  . Medication management 04/17/2014  . Hyperlipidemia, mixed   . Hypertension   . Asthma   . GERD (gastroesophageal reflux disease)   . Hypothyroidism     Patient Care Team: Unk Pinto, MD as PCP - General (Internal Medicine) Kristeen Miss, MD as Consulting Physician (Neurosurgery) Kathlee Nations, MD as Consulting Physician (Psychiatry)  SURGICAL HISTORY She  has a past surgical history that includes Dilation and curettage of uterus (2011); Ablation (2011); Cholecystectomy (2008); Tonsillectomy; buttocks surgery; Cesarean section (1992); right knee arthroscopy; Cervical fusion (03/2010); Colonoscopy; Esophagogastroduodenoscopy; and Lumbar laminectomy/decompression microdiscectomy (10/24/2012). FAMILY HISTORY Her family history includes Allergies in her mother; Arthritis in her mother; Depression in her mother; Heart attack in her father and mother; Hypertension in her mother. SOCIAL HISTORY She  reports that she has quit smoking. She has never used smokeless tobacco. She reports that she does not drink alcohol and does not use drugs.   Review of Systems  Constitutional: Negative for malaise/fatigue and weight loss.  HENT: Negative for hearing loss and tinnitus.   Eyes: Negative for blurred vision and double vision.  Respiratory: Negative for cough, sputum production, shortness of breath and wheezing.   Cardiovascular: Negative for chest pain, palpitations, orthopnea, claudication, leg swelling and PND.  Gastrointestinal: Negative for  abdominal pain, blood in stool, constipation, diarrhea, heartburn, melena, nausea and vomiting.  Genitourinary: Negative.   Musculoskeletal: Positive for back pain, falls (occasional, denies recent months) and joint pain (TMJ). Negative for myalgias.  Skin: Negative for rash.  Neurological: Negative for dizziness, tingling, sensory change, weakness and headaches.  Endo/Heme/Allergies: Negative for polydipsia.  Psychiatric/Behavioral: Negative.  Negative for depression, memory loss, substance abuse and suicidal ideas. The patient is not nervous/anxious and does not have insomnia.   All other systems reviewed and are negative.    Objective:     Today's Vitals   06/12/20 4098  BP: 120/68  Pulse: (!) 51  Temp: (!) 97.5 F (36.4 C)  SpO2: 99%  Weight: 120 lb 12.8 oz (54.8 kg)   Body mass index is 22.46 kg/m.  General appearance: alert, no distress, WD/WN, female HEENT: normocephalic, sclerae anicteric, TMs pearly, nares patent, no discharge or erythema, dry/flaky skin in canals; mask in place;oral exam deferred Neck: supple, no lymphadenopathy, no thyromegaly, no masses Heart: RRR, normal S1, S2, no murmurs Lungs: CTA bilaterally, no wheezes, rhonchi, or rales Abdomen: +bs, soft, non tender, non distended, no masses, no hepatomegaly, no splenomegaly Musculoskeletal: nontender, no swelling, no obvious deformity Extremities: no edema, no cyanosis, no clubbing Pulses: 2+ symmetric, upper and lower extremities, normal cap refill Neurological: alert, oriented x 3, CN2-12 intact, strength normal upper extremities and lower extremities, sensation normal throughout, DTRs 2+ throughout, no cerebellar signs, gait normal/steady Psychiatric: normal affect, behavior normal, pleasant    Izora Ribas, NP   06/12/2020

## 2020-06-12 ENCOUNTER — Ambulatory Visit (INDEPENDENT_AMBULATORY_CARE_PROVIDER_SITE_OTHER): Payer: PPO | Admitting: Adult Health

## 2020-06-12 ENCOUNTER — Encounter: Payer: Self-pay | Admitting: Adult Health

## 2020-06-12 ENCOUNTER — Ambulatory Visit: Payer: PPO | Admitting: Adult Health

## 2020-06-12 ENCOUNTER — Other Ambulatory Visit: Payer: Self-pay

## 2020-06-12 VITALS — BP 120/68 | HR 51 | Temp 97.5°F | Wt 120.8 lb

## 2020-06-12 DIAGNOSIS — F3341 Major depressive disorder, recurrent, in partial remission: Secondary | ICD-10-CM

## 2020-06-12 DIAGNOSIS — E559 Vitamin D deficiency, unspecified: Secondary | ICD-10-CM

## 2020-06-12 DIAGNOSIS — Z79899 Other long term (current) drug therapy: Secondary | ICD-10-CM | POA: Diagnosis not present

## 2020-06-12 DIAGNOSIS — Z6822 Body mass index (BMI) 22.0-22.9, adult: Secondary | ICD-10-CM

## 2020-06-12 DIAGNOSIS — G4730 Sleep apnea, unspecified: Secondary | ICD-10-CM | POA: Insufficient documentation

## 2020-06-12 DIAGNOSIS — R7303 Prediabetes: Secondary | ICD-10-CM

## 2020-06-12 DIAGNOSIS — E782 Mixed hyperlipidemia: Secondary | ICD-10-CM | POA: Diagnosis not present

## 2020-06-12 DIAGNOSIS — I10 Essential (primary) hypertension: Secondary | ICD-10-CM | POA: Diagnosis not present

## 2020-06-12 DIAGNOSIS — K219 Gastro-esophageal reflux disease without esophagitis: Secondary | ICD-10-CM

## 2020-06-12 MED ORDER — CYCLOBENZAPRINE HCL 10 MG PO TABS: 5.0000 mg | ORAL_TABLET | Freq: Every day | ORAL | 1 refills | Status: DC

## 2020-06-12 NOTE — Patient Instructions (Addendum)
Goals    . Exercise 150 min/wk Moderate Activity     Aim for 20-30 min of intentional exercise daily     . LDL CALC < 100          Preventing High Cholesterol Cholesterol is a white, waxy substance similar to fat that the human body needs to help build cells. The liver makes all the cholesterol that a person's body needs. Having high cholesterol (hypercholesterolemia) increases a person's risk for heart disease and stroke. Extra (excess) cholesterol comes from the food the person eats. High cholesterol can often be prevented with diet and lifestyle changes. If you already have high cholesterol, you can control it with diet and lifestyle changes and with medicine. How can high cholesterol affect me? If you have high cholesterol, deposits (plaques) may build up on the walls of your arteries. The arteries are the blood vessels that carry blood away from your heart. Plaques make the arteries narrower and stiffer. This can limit or block blood flow and cause blood clots to form. Blood clots:  Are tiny balls of cells that form in your blood.  Can move to the heart or brain, causing a heart attack or stroke. Plaques in arteries greatly increase your risk for heart attack and stroke.Making diet and lifestyle changes can reduce your risk for these conditions that may threaten your life. What can increase my risk? This condition is more likely to develop in people who:  Eat foods that are high in saturated fat or cholesterol. Saturated fat is mostly found in: ? Foods that contain animal fat, such as red meat and some dairy products. ? Certain fatty foods made from plants, such as tropical oils.  Are overweight.  Are not getting enough exercise.  Have a family history of high cholesterol. What actions can I take to prevent this? Nutrition   Eat less saturated fat.  Avoid trans fats (partially hydrogenated oils). These are often found in margarine and in some baked goods, fried foods, and  snacks bought in packages.  Avoid precooked or cured meat, such as sausages or meat loaves.  Avoid foods and drinks that have added sugars.  Eat more fruits, vegetables, and whole grains.  Choose healthy sources of protein, such as fish, poultry, lean cuts of red meat, beans, peas, lentils, and nuts.  Choose healthy sources of fat, such as: ? Nuts. ? Vegetable oils, especially olive oil. ? Fish that have healthy fats (omega-3 fatty acids), such as mackerel or salmon. The items listed above may not be a complete list of recommended foods and beverages. Contact a dietitian for more information. Lifestyle  Lose weight if you are overweight. Losing 5-10 lb (2.3-4.5 kg) can help prevent or control high cholesterol. It can also lower your risk for diabetes and high blood pressure. Ask your health care provider to help you with a diet and exercise plan to lose weight safely.  Do not use any products that contain nicotine or tobacco, such as cigarettes, e-cigarettes, and chewing tobacco. If you need help quitting, ask your health care provider.  Limit your alcohol intake. ? Do not drink alcohol if:  Your health care provider tells you not to drink.  You are pregnant, may be pregnant, or are planning to become pregnant. ? If you drink alcohol:  Limit how much you use to:  0-1 drink a day for women.  0-2 drinks a day for men.  Be aware of how much alcohol is in your drink. In the  [place] one drink equals one 12 oz bottle of beer (355 mL), one 5 oz glass of wine (148 mL), or one 1 oz glass of hard liquor (44 mL). Activity   Get enough exercise. Each week, do at least 150 minutes of exercise that takes a medium level of effort (moderate-intensity exercise). ? This is exercise that:  Makes your heart beat faster and makes you breathe harder than usual.  Allows you to still be able to talk. ? You could exercise in short sessions several times a day or longer sessions a few times a week.  For example, on 5 days each week, you could walk fast or ride your bike 3 times a day for 10 minutes each time.  Do exercises as told by your health care provider. Medicines  In addition to diet and lifestyle changes, your health care provider may recommend medicines to help lower cholesterol. This may be a medicine to lower the amount of cholesterol your liver makes. You may need medicine if: ? Diet and lifestyle changes do not lower your cholesterol enough. ? You have high cholesterol and other risk factors for heart disease or stroke.  Take over-the-counter and prescription medicines only as told by your health care provider. General information  Manage your risk factors for high cholesterol. Talk with your health care provider about all your risk factors and how to lower your risk.  Manage other conditions that you have, such as diabetes or high blood pressure (hypertension).  Have blood tests to check your cholesterol levels at regular points in time as told by your health care provider.  Keep all follow-up visits as told by your health care provider. This is important. Where to find more information  American Heart Association: www.heart.org  National Heart, Lung, and Blood Institute: https://wilson-eaton.com/ Summary  High cholesterol increases your risk for heart disease and stroke. By keeping your cholesterol level low, you can reduce your risk for these conditions.  High cholesterol can often be prevented with diet and lifestyle changes.  Work with your health care provider to manage your risk factors, and have your blood tested regularly. This information is not intended to replace advice given to you by your health care provider. Make sure you discuss any questions you have with your health care provider. Document Revised: 03/09/2019 Document Reviewed: 07/24/2016 Elsevier Patient Education  2020 [employer].

## 2020-06-13 LAB — CBC WITH DIFFERENTIAL/PLATELET
Absolute Monocytes: 511 cells/uL (ref 200–950)
Basophils Absolute: 43 cells/uL (ref 0–200)
Basophils Relative: 0.6 %
Eosinophils Absolute: 418 cells/uL (ref 15–500)
Eosinophils Relative: 5.8 %
HCT: 40.4 % (ref 35.0–45.0)
Hemoglobin: 13.9 g/dL (ref 11.7–15.5)
Lymphs Abs: 2390 cells/uL (ref 850–3900)
MCH: 32.5 pg (ref 27.0–33.0)
MCHC: 34.4 g/dL (ref 32.0–36.0)
MCV: 94.4 fL (ref 80.0–100.0)
MPV: 10.8 fL (ref 7.5–12.5)
Monocytes Relative: 7.1 %
Neutro Abs: 3838 cells/uL (ref 1500–7800)
Neutrophils Relative %: 53.3 %
Platelets: 228 10*3/uL (ref 140–400)
RBC: 4.28 10*6/uL (ref 3.80–5.10)
RDW: 11.9 % (ref 11.0–15.0)
Total Lymphocyte: 33.2 %
WBC: 7.2 10*3/uL (ref 3.8–10.8)

## 2020-06-13 LAB — TSH: TSH: 1.17 mIU/L (ref 0.40–4.50)

## 2020-06-13 LAB — LIPID PANEL
Cholesterol: 167 mg/dL (ref <200)
HDL: 40 mg/dL — ABNORMAL LOW (ref >=50)
LDL Cholesterol (Calc): 98 mg/dL (calc)
Non-HDL Cholesterol (Calc): 127 mg/dL (calc) (ref <130)
Total CHOL/HDL Ratio: 4.2 (calc) (ref <5.0)
Triglycerides: 196 mg/dL — ABNORMAL HIGH (ref <150)

## 2020-06-13 LAB — COMPLETE METABOLIC PANEL WITH GFR
AG Ratio: 2.1 (calc) (ref 1.0–2.5)
ALT: 16 U/L (ref 6–29)
AST: 18 U/L (ref 10–35)
Albumin: 4.4 g/dL (ref 3.6–5.1)
Alkaline phosphatase (APISO): 59 U/L (ref 37–153)
BUN: 18 mg/dL (ref 7–25)
CO2: 28 mmol/L (ref 20–32)
Calcium: 9.6 mg/dL (ref 8.6–10.4)
Chloride: 105 mmol/L (ref 98–110)
Creat: 0.92 mg/dL (ref 0.50–0.99)
GFR, Est African American: 77 mL/min/{1.73_m2} (ref >=60)
GFR, Est Non African American: 66 mL/min/{1.73_m2} (ref >=60)
Globulin: 2.1 g/dL (calc) (ref 1.9–3.7)
Glucose, Bld: 116 mg/dL — ABNORMAL HIGH (ref 65–99)
Potassium: 4.5 mmol/L (ref 3.5–5.3)
Sodium: 144 mmol/L (ref 135–146)
Total Bilirubin: 0.3 mg/dL (ref 0.2–1.2)
Total Protein: 6.5 g/dL (ref 6.1–8.1)

## 2020-06-13 LAB — MAGNESIUM: Magnesium: 2.1 mg/dL (ref 1.5–2.5)

## 2020-06-25 DIAGNOSIS — X32XXXD Exposure to sunlight, subsequent encounter: Secondary | ICD-10-CM | POA: Diagnosis not present

## 2020-06-25 DIAGNOSIS — L570 Actinic keratosis: Secondary | ICD-10-CM | POA: Diagnosis not present

## 2020-07-09 ENCOUNTER — Other Ambulatory Visit: Payer: Self-pay | Admitting: Internal Medicine

## 2020-07-09 DIAGNOSIS — J4520 Mild intermittent asthma, uncomplicated: Secondary | ICD-10-CM

## 2020-07-29 ENCOUNTER — Other Ambulatory Visit: Payer: Self-pay

## 2020-07-29 ENCOUNTER — Ambulatory Visit: Payer: PPO | Admitting: Podiatry

## 2020-07-29 ENCOUNTER — Telehealth (HOSPITAL_COMMUNITY): Payer: PPO | Admitting: Psychiatry

## 2020-07-30 ENCOUNTER — Encounter (HOSPITAL_COMMUNITY): Payer: Self-pay | Admitting: Psychiatry

## 2020-07-30 ENCOUNTER — Telehealth (INDEPENDENT_AMBULATORY_CARE_PROVIDER_SITE_OTHER): Payer: PPO | Admitting: Psychiatry

## 2020-07-30 ENCOUNTER — Other Ambulatory Visit: Payer: Self-pay

## 2020-07-30 DIAGNOSIS — F331 Major depressive disorder, recurrent, moderate: Secondary | ICD-10-CM | POA: Diagnosis not present

## 2020-07-30 DIAGNOSIS — F410 Panic disorder [episodic paroxysmal anxiety] without agoraphobia: Secondary | ICD-10-CM | POA: Diagnosis not present

## 2020-07-30 DIAGNOSIS — F411 Generalized anxiety disorder: Secondary | ICD-10-CM | POA: Diagnosis not present

## 2020-07-30 MED ORDER — DULOXETINE HCL 40 MG PO CPEP: 40.0000 mg | ORAL_CAPSULE | Freq: Every day | ORAL | 0 refills | Status: DC

## 2020-07-30 MED ORDER — LAMOTRIGINE 25 MG PO TABS: 25.0000 mg | ORAL_TABLET | Freq: Two times a day (BID) | ORAL | 0 refills | Status: DC

## 2020-07-30 MED ORDER — AMITRIPTYLINE HCL 25 MG PO TABS: 25.0000 mg | ORAL_TABLET | Freq: Every day | ORAL | 0 refills | Status: DC

## 2020-07-30 NOTE — Progress Notes (Signed)
Virtual Visit via Telephone Note  I connected with Sarah Rodriguez on 07/30/20 at 11:40 AM EDT by telephone and verified that I am speaking with the correct person using two identifiers.  Location: Patient: home Provider: home office   I discussed the limitations, risks, security and privacy concerns of performing an evaluation and management service by telephone and the availability of in person appointments. I also discussed with the patient that there may be a patient responsible charge related to this service. The patient expressed understanding and agreed to proceed.   History of Present Illness: Patient is evaluated by phone session.  She remains under a lot of stress because she is going back and forth to her father's house which finally she is able to sell.  But still she has a lot of her father's remaining and she gets overwhelmed.  She is taking medication but is still struggling with some time sleep and racing thoughts.  She has 1 or 2 panic attacks since the last visit and she takes one quarter of Klonopin which helps.  She is taking Lamictal and in the beginning she was reluctant but now she is open to try higher dose since it does help her mood.  She has no rash, itching, tremors or shakes.  She lives with her husband who is very supportive.  She is also concerned about increased weight since the last visit.  She gained 3 pounds since the last visit.  She denies any suicidal thoughts, crying spells, feeling of hopelessness or worthlessness.  She has no side effects from the medication including any rash or any itching.   Past Psychiatric History:Reviewed H/Odepression and taking antidepressants since 1996. H/Opanic attacks while driving. SawDr. Letta Moynahan and tried Prozac, Paxil, Zoloft, Xanax, Cymbalta and Klonopin. No h/oinpatient treatment or any suicidal attempt.   Psychiatric Specialty Exam: Physical Exam  Review of Systems  Weight 124 lb (56.2 kg).There is no  height or weight on file to calculate BMI.  General Appearance: NA  Eye Contact:  NA  Speech:  Slow  Volume:  Decreased  Mood:  Anxious and Dysphoric  Affect:  NA  Thought Process:  Descriptions of Associations: Intact  Orientation:  Full (Time, Place, and Person)  Thought Content:  Rumination  Suicidal Thoughts:  No  Homicidal Thoughts:  No  Memory:  Immediate;   Good Recent;   Good Remote;   Good  Judgement:  Good  Insight:  Present  Psychomotor Activity:  NA  Concentration:  Concentration: Good and Attention Span: Good  Recall:  Good  Fund of Knowledge:  Good  Language:  Good  Akathisia:  No  Handed:  Right  AIMS (if indicated):     Assets:  Communication Skills Desire for Improvement Housing Resilience Social Support  ADL's:  Intact  Cognition:  WNL  Sleep:   fair      Assessment and Plan: Major depressive disorder, recurrent.  Generalized anxiety disorder.  Panic attacks per  I reviewed her medication.  She agreed to give a trial of Lamictal 25 mg second pill at night to see if that helps her anxiety and depression.  I agree with that.  We will continue amitriptyline 25 mg at bedtime, Cymbalta 40 mg daily and Lamictal will try 25 mg twice a day.  She has Klonopin and does not need a new refill.  Recommended to call us back if she is any question or any concern.  Follow-up in 6 weeks.  Follow Up Instructions:  I discussed the assessment and treatment plan with the patient. The patient was provided an opportunity to ask questions and all were answered. The patient agreed with the plan and demonstrated an understanding of the instructions.   The patient was advised to call back or seek an in-person evaluation if the symptoms worsen or if the condition fails to improve as anticipated.  I provided 19 minutes of non-face-to-face time during this encounter.   Kathlee Nations, MD

## 2020-07-31 ENCOUNTER — Telehealth (HOSPITAL_COMMUNITY): Payer: PPO | Admitting: Psychiatry

## 2020-08-07 ENCOUNTER — Other Ambulatory Visit: Payer: Self-pay | Admitting: Internal Medicine

## 2020-08-07 DIAGNOSIS — L309 Dermatitis, unspecified: Secondary | ICD-10-CM

## 2020-08-07 MED ORDER — BETAMETHASONE DIPROPIONATE AUG 0.05 % EX CREA: TOPICAL_CREAM | CUTANEOUS | 3 refills | Status: DC

## 2020-08-12 ENCOUNTER — Ambulatory Visit (INDEPENDENT_AMBULATORY_CARE_PROVIDER_SITE_OTHER): Payer: PPO

## 2020-08-12 ENCOUNTER — Ambulatory Visit: Payer: PPO | Admitting: Podiatry

## 2020-08-12 ENCOUNTER — Other Ambulatory Visit: Payer: Self-pay

## 2020-08-12 DIAGNOSIS — M2041 Other hammer toe(s) (acquired), right foot: Secondary | ICD-10-CM

## 2020-08-13 NOTE — Progress Notes (Signed)
Subjective:  Patient ID: Sarah Rodriguez, female    DOB: 1956-01-22,  MRN: [id number] HPI Chief Complaint  Patient presents with  . Foot Problem    Rt 5th toe abnormal curving -pt states," toe goind under my other toe, seems fatter." x 1 1/2 yr; no apin -pt states she have had several bumps into toe, no redness/swelling -wrose with closed toe shoes Tx: taping     64 y.o. female presents with the above complaint.   ROS: Denies fever chills nausea vomiting muscle aches pains calf pain back pain chest pain shortness of breath.  Past Medical History:  Diagnosis Date  . Asthma    Dulera daily  . Chronic back pain    HNP  . Complication of anesthesia    slow to wake up  . Depression    takes Clonazepam nightly  . GERD (gastroesophageal reflux disease)    takes Protonix daily   . Heart murmur   . History of migraine    takes Relpax daily as needed  . HSV (herpes simplex virus) infection   . Hyperlipidemia    takes Atorvastatin daily  . Hypertension    takes Benicar daily  . Hypothyroidism    takes Synthroid daily  . MVP (mitral valve prolapse)   . Pneumonia    hx of--been many yrs ago  . Seasonal allergies    takes Allegra daily  . Shortness of breath    with exertion   Past Surgical History:  Procedure Laterality Date  . ABLATION  2011   Uterine  . buttocks surgery     at age 23 (coccyx repair)  . CERVICAL FUSION  03/2010  . CESAREAN SECTION  1992  . CHOLECYSTECTOMY  2008  . COLONOSCOPY    . DILATION AND CURETTAGE OF UTERUS  2011  . ESOPHAGOGASTRODUODENOSCOPY    . LUMBAR LAMINECTOMY/DECOMPRESSION MICRODISCECTOMY  10/24/2012   Procedure: LUMBAR LAMINECTOMY/DECOMPRESSION MICRODISCECTOMY 1 LEVEL;  Surgeon: Kristeen Miss, MD;  Location: [facility];  Service: Neurosurgery;  Laterality: Right;  Right Lumbar five-Sacral One Microdiskectomy  . right knee arthroscopy     x 3  . TONSILLECTOMY     at age 65    Current Outpatient Medications:  .  ADVAIR HFA 115-21 MCG/ACT  inhaler, INHALE 2 PUFFS BY MOUTH EVERY 12 HOURS 10 TO 15 MINUTES APART, Disp: 36 g, Rfl: 0 .  albuterol (PROAIR HFA) 108 (90 Base) MCG/ACT inhaler, Inhale 2 puffs into the lungs every 6 (six) hours as needed for wheezing or shortness of breath., Disp: 8 g, Rfl: 1 .  amitriptyline (ELAVIL) 25 MG tablet, Take 1 tablet (25 mg total) by mouth at bedtime., Disp: 90 tablet, Rfl: 0 .  atenolol (TENORMIN) 50 MG tablet, Take 1 tablet every Morning for BP, Disp: 90 tablet, Rfl: 3 .  Cholecalciferol (VITAMIN D PO), Take 4,000 Units by mouth daily. , Disp: , Rfl:  .  CINNAMON PO, Take 1,000 Units by mouth 2 (two) times daily. , Disp: , Rfl:  .  clonazePAM (KLONOPIN) 0.5 MG tablet, Take 1/2 to 1 tablet as needed for severe anxiety., Disp: 20 tablet, Rfl: 0 .  cyclobenzaprine (FLEXERIL) 10 MG tablet, Take 0.5-1 tablets (5-10 mg total) by mouth at bedtime., Disp: 30 tablet, Rfl: 1 .  diclofenac sodium (VOLTAREN) 1 % GEL, Apply 4 g topically 4 (four) times daily., Disp: 100 g, Rfl: 3 .  DULoxetine HCl 40 MG CPEP, Take 40 mg by mouth daily., Disp: 90 capsule, Rfl: 0 .  fexofenadine-pseudoephedrine (ALLEGRA-D 12 HOUR) 60-120 MG per tablet, Take 1 tablet by mouth daily., Disp: , Rfl:  .  fluticasone (FLONASE) 50 MCG/ACT nasal spray, INSTILL 2 SPRAYS IN EACH NOSTRIL EVERY EVENING, Disp: 48 g, Rfl: 3 .  halobetasol (ULTRAVATE) 0.05 % ointment, , Disp: , Rfl: 3 .  lamoTRIgine (LAMICTAL) 25 MG tablet, Take 1 tablet (25 mg total) by mouth 2 (two) times daily., Disp: 180 tablet, Rfl: 0 .  levothyroxine (SYNTHROID) 50 MCG tablet, TAKE 1/2 TABLET BY MOUTH DAILY, Disp: 90 tablet, Rfl: 1 .  losartan (COZAAR) 50 MG tablet, Take 1 tablet Daily for BP, Disp: 90 tablet, Rfl: 3 .  Magnesium 250 MG TABS, Take 3 tablets by mouth daily. , Disp: , Rfl:  .  meloxicam (MOBIC) 15 MG tablet, Take 1 tablet Daily with Food for Pain & Inflammation, Disp: 90 tablet, Rfl: 3 .  montelukast (SINGULAIR) 10 MG tablet, Take 1 tablet Daily for  Allergies, Disp: 90 tablet, Rfl: 3 .  nystatin (MYCOSTATIN) 100000 UNIT/ML suspension, Take 1 teaspoon (24ml)  4 x /day  (Swish & Swallow), Disp: 473 mL, Rfl: 1 .  pantoprazole (PROTONIX) 40 MG tablet, TAKE 1 TABLET(40 MG) BY MOUTH TWICE DAILY, Disp: 180 tablet, Rfl: 1 .  pregabalin (LYRICA) 50 MG capsule, Take 50 mg by mouth at bedtime., Disp: , Rfl:  .  PREMARIN vaginal cream, INSERT 1/2 GRAM VAGINALLY VIA APPLICATOR NIGHTLY FOR W EEKS THEN JUST DO TEICE A WEEK, Disp: , Rfl: 11 .  Rimegepant Sulfate (NURTEC) 75 MG TBDP, Take 75 mg by mouth as needed (migraine)., Disp: 2 tablet, Rfl: 0 .  rosuvastatin (CRESTOR) 40 MG tablet, Take 1/2 to 1 tablet daily or as directed for Cholesterol, Disp: 90 tablet, Rfl: 1 .  Zinc 50 MG TABS, Take by mouth daily., Disp: , Rfl:   Allergies  Allergen Reactions  . Demerol [Meperidine] Other (See Comments)    REACTION: unknown--itching  . Epinephrine Other (See Comments)    "Sensitivity."  . Niacin And Related Rash   Review of Systems Objective:  There were no vitals filed for this visit.  General: Well developed, nourished, in no acute distress, alert and oriented x3   Dermatological: Skin is warm, dry and supple bilateral. Nails x 10 are well maintained; remaining integument appears unremarkable at this time. There are no open sores, no preulcerative lesions, no rash or signs of infection present.  Vascular: Dorsalis Pedis artery and Posterior Tibial artery pedal pulses are 2/4 bilateral with immedate capillary fill time. Pedal hair growth present. No varicosities and no lower extremity edema present bilateral.   Neruologic: Grossly intact via light touch bilateral. Vibratory intact via tuning fork bilateral. Protective threshold with Semmes Wienstein monofilament intact to all pedal sites bilateral. Patellar and Achilles deep tendon reflexes 2+ bilateral. No Babinski or clonus noted bilateral.   Musculoskeletal: No gross boney pedal deformities bilateral.  No pain, crepitus, or limitation noted with foot and ankle range of motion bilateral. Muscular strength 5/5 in all groups tested bilateral.  Adductovarus rotated fifth toe of the right foot with some osteoarthritic changes.  Thickening of the DIPJ area.  Gait: Unassisted, Nonantalgic.    Radiographs:  Radiographs taken today demonstrate osseously mature individual with adductovarus rotated hammertoe deformity and which has had multiple fractures to the head and neck of the proximal phalanx.  This is resulted in hypertrophy of the joint and most likely the rotation deformity.  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  Plan: We went over consent form today line by line number by number giving her ample time to ask when she is off it regarding a derotational arthroplasty fifth digit right foot.  She understands this and is amenable to it.  We did discuss the possible postop complications which may include but are not limited to postop pain bleeding swelling infection recurrence need for further surgery overcorrection under correction loss of digit loss of limb loss of life.  Dispensed Darco shoe for postop recovery provided her with information regarding the surgery center anesthesia group and instructions for the morning of surgery.  I will follow-up with her in the near future for surgical intervention.     Kristalynn Coddington T. [place], [place]

## 2020-08-15 ENCOUNTER — Other Ambulatory Visit: Payer: Self-pay | Admitting: Internal Medicine

## 2020-08-15 DIAGNOSIS — K219 Gastro-esophageal reflux disease without esophagitis: Secondary | ICD-10-CM

## 2020-08-15 MED ORDER — PANTOPRAZOLE SODIUM 40 MG PO TBEC: DELAYED_RELEASE_TABLET | ORAL | 0 refills | Status: DC

## 2020-08-20 ENCOUNTER — Telehealth (HOSPITAL_COMMUNITY): Payer: Self-pay | Admitting: *Deleted

## 2020-08-20 NOTE — Telephone Encounter (Signed)
Pt called regarding starting to see therapist, Charolotte Eke, again. Writer provided information to front desk and they will f/u.

## 2020-08-20 NOTE — Telephone Encounter (Signed)
Thanks

## 2020-08-26 ENCOUNTER — Other Ambulatory Visit: Payer: Self-pay

## 2020-08-26 ENCOUNTER — Ambulatory Visit (INDEPENDENT_AMBULATORY_CARE_PROVIDER_SITE_OTHER): Payer: PPO | Admitting: Licensed Clinical Social Worker

## 2020-08-26 ENCOUNTER — Encounter (HOSPITAL_COMMUNITY): Payer: Self-pay | Admitting: Licensed Clinical Social Worker

## 2020-08-26 DIAGNOSIS — F411 Generalized anxiety disorder: Secondary | ICD-10-CM | POA: Diagnosis not present

## 2020-08-26 DIAGNOSIS — F410 Panic disorder [episodic paroxysmal anxiety] without agoraphobia: Secondary | ICD-10-CM

## 2020-08-26 DIAGNOSIS — F331 Major depressive disorder, recurrent, moderate: Secondary | ICD-10-CM | POA: Diagnosis not present

## 2020-08-26 DIAGNOSIS — F4321 Adjustment disorder with depressed mood: Secondary | ICD-10-CM | POA: Diagnosis not present
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
## 2020-08-28 ENCOUNTER — Telehealth: Payer: Self-pay

## 2020-08-28 NOTE — Telephone Encounter (Signed)
DOS 09/12/2020  [facility] REPAIR 5TH RT - [id number]  RECEIVED AUTHORIZATION FAX FROM [employer]  AUTH # [id number] GOOD FROM 09/12/2020 - 12/11/2020

## 2020-09-02 ENCOUNTER — Ambulatory Visit (HOSPITAL_COMMUNITY): Payer: PPO | Admitting: Licensed Clinical Social Worker

## 2020-09-10 ENCOUNTER — Telehealth (INDEPENDENT_AMBULATORY_CARE_PROVIDER_SITE_OTHER): Payer: PPO | Admitting: Psychiatry

## 2020-09-10 ENCOUNTER — Other Ambulatory Visit: Payer: Self-pay | Admitting: Podiatry

## 2020-09-10 ENCOUNTER — Ambulatory Visit (HOSPITAL_COMMUNITY): Payer: PPO | Admitting: Licensed Clinical Social Worker

## 2020-09-10 ENCOUNTER — Other Ambulatory Visit: Payer: Self-pay

## 2020-09-10 ENCOUNTER — Encounter (HOSPITAL_COMMUNITY): Payer: Self-pay | Admitting: Psychiatry

## 2020-09-10 VITALS — Wt 123.0 lb

## 2020-09-10 DIAGNOSIS — F331 Major depressive disorder, recurrent, moderate: Secondary | ICD-10-CM | POA: Diagnosis not present

## 2020-09-10 DIAGNOSIS — F410 Panic disorder [episodic paroxysmal anxiety] without agoraphobia: Secondary | ICD-10-CM | POA: Diagnosis not present

## 2020-09-10 DIAGNOSIS — F411 Generalized anxiety disorder: Secondary | ICD-10-CM

## 2020-09-10 MED ORDER — DULOXETINE HCL 40 MG PO CPEP: 40.0000 mg | ORAL_CAPSULE | Freq: Every day | ORAL | 0 refills | Status: DC

## 2020-09-10 MED ORDER — HYDROCODONE-ACETAMINOPHEN 10-325 MG PO TABS: 1.0000 | ORAL_TABLET | Freq: Four times a day (QID) | ORAL | 0 refills | Status: AC | PRN

## 2020-09-10 MED ORDER — ONDANSETRON HCL 4 MG PO TABS: 4.0000 mg | ORAL_TABLET | Freq: Three times a day (TID) | ORAL | 0 refills | Status: DC | PRN

## 2020-09-10 MED ORDER — CEPHALEXIN 500 MG PO CAPS
500.0000 mg | ORAL_CAPSULE | Freq: Three times a day (TID) | ORAL | 0 refills | Status: DC
Start: 2020-09-10 — End: 2020-09-23

## 2020-09-10 MED ORDER — LAMOTRIGINE 100 MG PO TABS: 100.0000 mg | ORAL_TABLET | Freq: Two times a day (BID) | ORAL | 0 refills | Status: DC

## 2020-09-10 MED ORDER — AMITRIPTYLINE HCL 25 MG PO TABS: 25.0000 mg | ORAL_TABLET | Freq: Every day | ORAL | 0 refills | Status: DC
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
## 2020-09-12 DIAGNOSIS — M2041 Other hammer toe(s) (acquired), right foot: Secondary | ICD-10-CM | POA: Diagnosis not present

## 2020-09-17 ENCOUNTER — Ambulatory Visit (HOSPITAL_COMMUNITY): Payer: PPO | Admitting: Licensed Clinical Social Worker

## 2020-09-23 ENCOUNTER — Ambulatory Visit (INDEPENDENT_AMBULATORY_CARE_PROVIDER_SITE_OTHER): Payer: PPO | Admitting: Podiatry

## 2020-09-23 ENCOUNTER — Encounter: Payer: Self-pay | Admitting: Podiatry

## 2020-09-23 ENCOUNTER — Ambulatory Visit (INDEPENDENT_AMBULATORY_CARE_PROVIDER_SITE_OTHER): Payer: PPO

## 2020-09-23 ENCOUNTER — Encounter: Payer: PPO | Admitting: Podiatry

## 2020-09-23 ENCOUNTER — Other Ambulatory Visit: Payer: Self-pay

## 2020-09-23 ENCOUNTER — Encounter: Payer: Self-pay | Admitting: Internal Medicine

## 2020-09-23 DIAGNOSIS — Z9889 Other specified postprocedural states: Secondary | ICD-10-CM

## 2020-09-23 DIAGNOSIS — M2041 Other hammer toe(s) (acquired), right foot: Secondary | ICD-10-CM

## 2020-09-23 NOTE — Progress Notes (Signed)
Annual Screening/Preventative Visit & Comprehensive Evaluation &  Examination      This very nice 64 y.o.  MWF  presents for a Screening /Preventative Visit & comprehensive evaluation and management of multiple medical co-morbidities.  Patient has been followed for HTN, HLD, Prediabetes  and Vitamin D Deficiency.  Patient has GERD controlled with diet & her pantoprazole.  Patient is followed by Dr Berniece Andreas for anxiety & panic attacks & Major Depressive Disorder.       Patient  is on SS Disability(2016) for a Chronic Pain Syndromeattributed to Cx DDD / CxHNP  (1st Cx surgery in 2011 and a 2sd surgery  L5/S1 in 2013 at  by Dr Ellene Route and 3rd surgery- LumbarFusion in Nov 2016). She reports chronic intermittent RLE pain.       HTN predates circa 2007. Patient's BP has been controlled at home and patient denies any cardiac symptoms as chest pain, palpitations, shortness of breath, dizziness or ankle swelling. Today's BP is at goal - 138/84.       Patient's hyperlipidemia is controlled with diet and Rosuvstatin. Patient denies myalgias or other medication SE's. Last lipids were at goal except elevated Trig's:  Lab Results  Component Value Date   CHOL 167 06/12/2020   HDL 40 (L) 06/12/2020   LDLCALC 98 06/12/2020   TRIG 196 (H) 06/12/2020   CHOLHDL 4.2 06/12/2020       Patient has hx/o prediabetes predating (A1c 5.9% /2009&6.3% /2014)  and patient denies reactive hypoglycemic symptoms, visual blurring, diabetic polys or paresthesias. Last A1c was not at goal:  Lab Results  Component Value Date   HGBA1C 5.8 (H) 03/11/2020       Patient was initiated on thyroid replacement when dx'd Hypothyroid in 2009.      Finally, patient has history of Vitamin D Deficiency ("9" /2009) and last Vitamin D was at goal:  Lab Results  Component Value Date   VD25OH 48 03/11/2020    Current Outpatient Medications on File Prior to Visit  Medication Sig  . ADVAIR HFA 115-21 inhaler 2 PUFFS   EVERY 12 HRS 10 TO 15 MINUTES APART  . albuterol HFA inhaler  2 puffs  every 6  hours as needed   . amitriptyline  25 MG tablet Take 1 tablet  at bedtime.  Marland Kitchen atenolol  50 MG tablet Take 1 tablet every Morning for BP  . VITAMIN D Take 4,000 Units  daily.   Marland Kitchen CINNAMON PO Take 1,000 Units  2  times daily.   . clonazePAM (0.5 MG tablet Take 1/2 to 1 tablet as needed for severe anxiety.  . diclofenac  1 % GEL Apply 4 g topically 4  times daily.  . DULoxetine HCl 40 MG  Take  daily.  . ALLEGRA-D 60-120 MG  Take 1 tablet daily.  Marland Kitchen FLONASE  nasal spray I2 SPRAYS IN EACH NOSTRIL EVERY EVENING  . ULTRAVATE 0.05 % oint   . LAMICTAL 100 MG tablet Take 1 tablet  2  times daily.  Marland Kitchen levothyroxine 50 MCG tablet TAKE 1/2 TABLET  DAILY  . losartan (COZAAR) 50 MG  Take 1 tablet Daily for BP  . Magnesium 250 MG TABS Take 3 tablets b daily.   . montelukast 10 MG tablet Take 1 tablet Daily for Allergies  . Nystatin 100000 U/ML susp Take 1 teaspoon (35ml)  4 x /day  (Swish & Swallow)  . ondansetron  4 MG t Take 1 tablet every 8 hours as needed.  Marland Kitchen  pantoprazole ( 40 MG tablet Take    1 tablet    2 x /day      . pregabalin  50 MG caps Take 50 mg by mouth at bedtime.  Marland Kitchen PREMARIN vaginal cream TWICE A WEEK  . NURTEC 75 MG TBDP Take 75 mg  as needed (migraine).  . rosuvastatin  40 MG tablet Take 1/2 to 1 tablet daily   . Zinc 50 MG TABS Take by mouth daily.    Allergies  Allergen Reactions  . Demerol [Meperidine] Other (See Comments)    REACTION: unknown--itching  . Epinephrine Other (See Comments)    "Sensitivity."  . Niacin And Related Rash   Past Medical History:  Diagnosis Date  . Asthma    Dulera daily  . Chronic back pain    HNP  . Complication of anesthesia    slow to wake up  . Depression    takes Clonazepam nightly  . GERD (gastroesophageal reflux disease)    takes Protonix daily   . Heart murmur   . History of migraine    takes Relpax daily as needed  . HSV (herpes simplex virus)  infection   . Hyperlipidemia    takes Atorvastatin daily  . Hypertension    takes Benicar daily  . Hypothyroidism    takes Synthroid daily  . MVP (mitral valve prolapse)   . Pneumonia    hx of--been many yrs ago  . Seasonal allergies    takes Allegra daily  . Shortness of breath    with exertion   Health Maintenance  Topic Date Due  . INFLUENZA VACCINE  06/29/2020  . MAMMOGRAM  10/21/2020  . COLONOSCOPY  10/04/2023  . TETANUS/TDAP  05/05/2026  . COVID-19 Vaccine  Completed  . Hepatitis C Screening  Completed  . HIV Screening  Completed   Immunization History  Administered Date(s) Administered  . Influenza Inj Mdck Quad Pf 08/27/2019  . Influenza Inj Mdck Quad With Preservative 09/14/2017, 10/19/2018  . Influenza,inj,quad, With Preservative 11/09/2016  . Influenza-Unspecified 09/18/2013  . Moderna SARS-COVID-2 Vaccination 02/15/2020, 03/18/2020  . PPD Test 04/17/2014  . Pneumococcal-Unspecified 11/30/1995  . Td 08/27/2005, 05/05/2016    Last Colon - 10/03/2013 - Dr Romilda Garret - recc 5 yr f/u - due Nov 2019 - Overdue - patient aware & will re-schedule   Last MGM - 10/22/2019  Past Surgical History:  Procedure Laterality Date  . ABLATION  2011   Uterine  . buttocks surgery     at age 67 (coccyx repair)  . CERVICAL FUSION  03/2010  . CESAREAN SECTION  1992  . CHOLECYSTECTOMY  2008  . COLONOSCOPY    . DILATION AND CURETTAGE OF UTERUS  2011  . ESOPHAGOGASTRODUODENOSCOPY    . LUMBAR LAMINECTOMY/DECOMPRESSION MICRODISCECTOMY  10/24/2012   Procedure: LUMBAR LAMINECTOMY/DECOMPRESSION MICRODISCECTOMY 1 LEVEL;  Surgeon: Kristeen Miss, MD;  Location: [facility];  Service: Neurosurgery;  Laterality: Right;  Right Lumbar five-Sacral One Microdiskectomy  . right knee arthroscopy     x 3  . TONSILLECTOMY     at age 25   Family History  Problem Relation Age of Onset  . Arthritis Mother   . Heart attack Mother   . Depression Mother   . Allergies Mother   . Hypertension  Mother   . Heart attack Father    Social History   Tobacco Use  . Smoking status: Former Research scientist (life sciences)  . Smokeless tobacco: Never Used  . Tobacco comment: quit at age 38  Vaping  Use  . Vaping Use: Never used  Substance Use Topics  . Alcohol use: No    Comment: 2-3 times a yr  . Drug use: No    ROS Constitutional: Denies fever, chills, weight loss/gain, headaches, insomnia,  night sweats, and change in appetite. Does c/o fatigue. Eyes: Denies redness, blurred vision, diplopia, discharge, itchy, watery eyes.  ENT: Denies discharge, congestion, post nasal drip, epistaxis, sore throat, earache, hearing loss, dental pain, Tinnitus, Vertigo, Sinus pain, snoring.  Cardio: Denies chest pain, palpitations, irregular heartbeat, syncope, dyspnea, diaphoresis, orthopnea, PND, claudication, edema Respiratory: denies cough, dyspnea, DOE, pleurisy, hoarseness, laryngitis, wheezing.  Gastrointestinal: Denies dysphagia, heartburn, reflux, water brash, pain, cramps, nausea, vomiting, bloating, diarrhea, constipation, hematemesis, melena, hematochezia, jaundice, hemorrhoids Genitourinary: Denies dysuria, frequency, urgency, nocturia, hesitancy, discharge, hematuria, flank pain Breast: Breast lumps, nipple discharge, bleeding.  Musculoskeletal: Denies arthralgia, myalgia, stiffness, Jt. Swelling, pain, limp, and strain/sprain. Denies falls. Skin: Denies puritis, rash, hives, warts, acne, eczema, changing in skin lesion Neuro: No weakness, tremor, incoordination, spasms, paresthesia, pain Psychiatric: Denies confusion, memory loss, sensory loss. Denies Depression. Endocrine: Denies change in weight, skin, hair change, nocturia, and paresthesia, diabetic polys, visual blurring, hyper / hypo glycemic episodes.  Heme/Lymph: No excessive bleeding, bruising, enlarged lymph nodes.  Physical Exam  BP 138/84   Pulse (!) 56   Temp (!) 97.4 F (36.3 C)   Resp 16   Ht 5' 1.5" (1.562 m)   Wt 124 lb 12.8 oz (56.6  kg)   SpO2 94%   BMI 23.20 kg/m   General Appearance: Well nourished, well groomed and in no apparent distress.  Eyes: PERRLA, EOMs, conjunctiva no swelling or erythema, normal fundi and vessels. Sinuses: No frontal/maxillary tenderness ENT/Mouth: EACs patent / TMs  nl. Nares clear without erythema, swelling, mucoid exudates. Oral hygiene is good. No erythema, swelling, or exudate. Tongue normal, non-obstructing. Tonsils not swollen or erythematous. Hearing normal.  Neck: Supple, thyroid not palpable. No bruits, nodes or JVD. Respiratory: Respiratory effort normal.  BS equal and clear bilateral without rales, rhonci, wheezing or stridor. Cardio: Heart sounds are normal with regular rate and rhythm and no murmurs, rubs or gallops. Peripheral pulses are normal and equal bilaterally without edema. No aortic or femoral bruits. Chest: symmetric with normal excursions and percussion. Breasts: Symmetric, without lumps, nipple discharge, retractions, or fibrocystic changes.  Abdomen: Flat, soft with bowel sounds active. Nontender, no guarding, rebound, hernias, masses, or organomegaly.  Lymphatics: Non tender without lymphadenopathy.  Genitourinary:  Musculoskeletal: Full ROM all peripheral extremities, joint stability, 5/5 strength, and normal gait. Skin: Warm and dry without rashes, lesions, cyanosis, clubbing or  ecchymosis.  Neuro: Cranial nerves intact, reflexes equal bilaterally. Normal muscle tone, no cerebellar symptoms. Sensation intact.  Pysch: Alert and oriented X 3, normal affect, Insight and Judgment appropriate.   Assessment and Plan  1. Annual Preventative Screening Examination   2. Essential hypertension  - EKG 12-Lead - Urinalysis, Routine w reflex microscopic - Microalbumin / creatinine urine ratio - CBC with Differential/Platelet - COMPLETE METABOLIC PANEL WITH GFR - Magnesium - TSH  3. Hyperlipidemia, mixed  - EKG 12-Lead - Lipid panel - TSH  4. Abnormal  glucose  - EKG 12-Lead - Hemoglobin A1c - Insulin, random  5. Vitamin D deficiency  - VITAMIN D 25 Hydroxy   6. Hypothyroidism  - TSH  7. Prediabetes  - Hemoglobin A1c - Insulin, random  8. Gastroesophageal reflux disease   9. Migraine with aura and without status migrainosus, not intractable  -  CBC with Differential/Platelet  10. Chronic back pain, unspecified back location, unspecified back pain laterality   11. Screening for colorectal cancer  - POC Hemoccult Bld/Stl    12. Screening for ischemic heart disease  - EKG 12-Lead  13. FHx: heart disease  - EKG 12-Lead  14. Medication management  - Urinalysis, Routine w reflex microscopic - Microalbumin / creatinine urine ratio - CBC with Differential/Platelet - COMPLETE METABOLIC PANEL WITH GFR - Magnesium - Lipid panel - TSH - Hemoglobin A1c - Insulin, random - VITAMIN D 25 Hydroxy           Patient was counseled in prudent diet to achieve/maintain BMI less than 25 for weight control, BP monitoring, regular exercise and medications. Discussed med's effects and SE's. Screening labs and tests as requested with regular follow-up as recommended. Over 40 minutes of exam, counseling, chart review and high complex critical decision making was performed.   Kirtland Bouchard, MD

## 2020-09-23 NOTE — Progress Notes (Signed)
She presents today for first postop visit she is status post derotational arthroplasty fifth digit right foot date of surgery 09/12/2020 states that it feels okay feels a little tight. She denies fever chills nausea vomiting muscle aches pains calf pain back pain chest pain shortness of breath.  Objective: Dry sterile dressing intact was removed demonstrates mildly edematous toe is rectus in good position. Completely derotated rectus in position. No erythema cellulitis drainage or odor. Sutures are intact margins well coapted  Radiographs demonstrate well healing arthroplasty. Fifth toe is rectus and and in good position.  Assessment: Well-healing surgical toe x1 week.  Plan: Follow-up with me in 1 week for suture removal and demonstration on how to wrap the toe.

## 2020-09-23 NOTE — Patient Instructions (Signed)

## 2020-09-24 ENCOUNTER — Ambulatory Visit (INDEPENDENT_AMBULATORY_CARE_PROVIDER_SITE_OTHER): Payer: PPO | Admitting: Internal Medicine

## 2020-09-24 VITALS — BP 138/84 | HR 56 | Temp 97.4°F | Resp 16 | Ht 61.5 in | Wt 124.8 lb

## 2020-09-24 DIAGNOSIS — E559 Vitamin D deficiency, unspecified: Secondary | ICD-10-CM | POA: Diagnosis not present

## 2020-09-24 DIAGNOSIS — Z8249 Family history of ischemic heart disease and other diseases of the circulatory system: Secondary | ICD-10-CM | POA: Diagnosis not present

## 2020-09-24 DIAGNOSIS — E782 Mixed hyperlipidemia: Secondary | ICD-10-CM

## 2020-09-24 DIAGNOSIS — E039 Hypothyroidism, unspecified: Secondary | ICD-10-CM

## 2020-09-24 DIAGNOSIS — Z87891 Personal history of nicotine dependence: Secondary | ICD-10-CM

## 2020-09-24 DIAGNOSIS — R7303 Prediabetes: Secondary | ICD-10-CM | POA: Diagnosis not present

## 2020-09-24 DIAGNOSIS — Z0000 Encounter for general adult medical examination without abnormal findings: Secondary | ICD-10-CM

## 2020-09-24 DIAGNOSIS — R7309 Other abnormal glucose: Secondary | ICD-10-CM

## 2020-09-24 DIAGNOSIS — Z79899 Other long term (current) drug therapy: Secondary | ICD-10-CM

## 2020-09-24 DIAGNOSIS — G43109 Migraine with aura, not intractable, without status migrainosus: Secondary | ICD-10-CM | POA: Diagnosis not present

## 2020-09-24 DIAGNOSIS — Z136 Encounter for screening for cardiovascular disorders: Secondary | ICD-10-CM

## 2020-09-24 DIAGNOSIS — I10 Essential (primary) hypertension: Secondary | ICD-10-CM

## 2020-09-24 DIAGNOSIS — K219 Gastro-esophageal reflux disease without esophagitis: Secondary | ICD-10-CM

## 2020-09-24 DIAGNOSIS — Z1211 Encounter for screening for malignant neoplasm of colon: Secondary | ICD-10-CM

## 2020-09-24 DIAGNOSIS — G8929 Other chronic pain: Secondary | ICD-10-CM

## 2020-09-24 DIAGNOSIS — Z23 Encounter for immunization: Secondary | ICD-10-CM | POA: Diagnosis not present

## 2020-09-24 DIAGNOSIS — Z0001 Encounter for general adult medical examination with abnormal findings: Secondary | ICD-10-CM

## 2020-09-25 LAB — COMPLETE METABOLIC PANEL WITH GFR
AG Ratio: 2 (calc) (ref 1.0–2.5)
ALT: 17 U/L (ref 6–29)
AST: 21 U/L (ref 10–35)
Albumin: 4.4 g/dL (ref 3.6–5.1)
Alkaline phosphatase (APISO): 57 U/L (ref 37–153)
BUN: 23 mg/dL (ref 7–25)
CO2: 30 mmol/L (ref 20–32)
Calcium: 9.9 mg/dL (ref 8.6–10.4)
Chloride: 101 mmol/L (ref 98–110)
Creat: 0.94 mg/dL (ref 0.50–0.99)
GFR, Est African American: 75 mL/min/{1.73_m2} (ref >=60)
GFR, Est Non African American: 65 mL/min/{1.73_m2} (ref >=60)
Globulin: 2.2 g/dL (calc) (ref 1.9–3.7)
Glucose, Bld: 107 mg/dL — ABNORMAL HIGH (ref 65–99)
Potassium: 5.2 mmol/L (ref 3.5–5.3)
Sodium: 139 mmol/L (ref 135–146)
Total Bilirubin: 0.4 mg/dL (ref 0.2–1.2)
Total Protein: 6.6 g/dL (ref 6.1–8.1)

## 2020-09-25 LAB — LIPID PANEL
Cholesterol: 171 mg/dL (ref <200)
HDL: 40 mg/dL — ABNORMAL LOW (ref >=50)
LDL Cholesterol (Calc): 95 mg/dL (calc)
Non-HDL Cholesterol (Calc): 131 mg/dL (calc) — ABNORMAL HIGH (ref <130)
Total CHOL/HDL Ratio: 4.3 (calc) (ref <5.0)
Triglycerides: 237 mg/dL — ABNORMAL HIGH (ref <150)

## 2020-09-25 LAB — CBC WITH DIFFERENTIAL/PLATELET
Absolute Monocytes: 468 cells/uL (ref 200–950)
Basophils Absolute: 30 cells/uL (ref 0–200)
Basophils Relative: 0.5 %
Eosinophils Absolute: 138 cells/uL (ref 15–500)
Eosinophils Relative: 2.3 %
HCT: 40.7 % (ref 35.0–45.0)
Hemoglobin: 13.8 g/dL (ref 11.7–15.5)
Lymphs Abs: 1782 cells/uL (ref 850–3900)
MCH: 31.9 pg (ref 27.0–33.0)
MCHC: 33.9 g/dL (ref 32.0–36.0)
MCV: 94.2 fL (ref 80.0–100.0)
MPV: 10.6 fL (ref 7.5–12.5)
Monocytes Relative: 7.8 %
Neutro Abs: 3582 cells/uL (ref 1500–7800)
Neutrophils Relative %: 59.7 %
Platelets: 257 10*3/uL (ref 140–400)
RBC: 4.32 10*6/uL (ref 3.80–5.10)
RDW: 11.5 % (ref 11.0–15.0)
Total Lymphocyte: 29.7 %
WBC: 6 10*3/uL (ref 3.8–10.8)

## 2020-09-25 LAB — URINALYSIS, ROUTINE W REFLEX MICROSCOPIC
Bilirubin Urine: NEGATIVE
Glucose, UA: NEGATIVE
Hgb urine dipstick: NEGATIVE
Leukocytes,Ua: NEGATIVE
Nitrite: NEGATIVE
Protein, ur: NEGATIVE
Specific Gravity, Urine: 1.027 (ref 1.001–1.03)
pH: 7 (ref 5.0–8.0)

## 2020-09-25 LAB — VITAMIN D 25 HYDROXY (VIT D DEFICIENCY, FRACTURES): Vit D, 25-Hydroxy: 89 ng/mL (ref 30–100)

## 2020-09-25 LAB — HEMOGLOBIN A1C
Hgb A1c MFr Bld: 5.8 % of total Hgb — ABNORMAL HIGH (ref <5.7)
Mean Plasma Glucose: 120 (calc)
eAG (mmol/L): 6.6 (calc)

## 2020-09-25 LAB — MICROALBUMIN / CREATININE URINE RATIO
Creatinine, Urine: 121 mg/dL (ref 20–275)
Microalb Creat Ratio: 5 mcg/mg creat (ref <30)
Microalb, Ur: 0.6 mg/dL

## 2020-09-25 LAB — TSH: TSH: 1.14 mIU/L (ref 0.40–4.50)

## 2020-09-25 LAB — MAGNESIUM: Magnesium: 2.4 mg/dL (ref 1.5–2.5)

## 2020-09-25 LAB — INSULIN, RANDOM: Insulin: 13.4 u[IU]/mL

## 2020-09-25 NOTE — Progress Notes (Signed)
========================================================== -   Test results slightly outside the reference range are not unusual. If there is anything important, I will review this with you,  otherwise it is considered normal test values.  If you have further questions,  please do not hesitate to contact me at the office or via My Chart.  ==========================================================  -  Total Chol = 171 and LDL Chol = 95 - Both  Excellent   - Very low risk for Heart Attack  / Stroke =============================================================  - But Triglycerides (   237   ) or fats in blood are too high  (goal is less than 150)    - Recommend avoid fried & greasy foods,  sweets / candy,   - Avoid white rice  (brown or wild rice or Quinoa is OK),   - Avoid white potatoes  (sweet potatoes are OK)   - Avoid anything made from white flour  - bagels, doughnuts, rolls, buns, biscuits, white and   wheat breads, pizza crust and traditional  pasta made of white flour & egg white  - (vegetarian pasta or spinach or wheat pasta is OK).    - Multi-grain bread is OK - like multi-grain flat bread or  sandwich thins.   - Avoid alcohol in excess.   - Exercise is also important. ==========================================================  -  A1c = 5.8%   - Blood sugar and A1c are STILL elevated in the borderline and  early or pre-diabetes range which has the same   300% increased risk for heart attack, stroke, cancer and   alzheimer- type vascular dementia as full blown diabetes.   But the good news is that diet, exercise with  weight loss can cure the early diabetes at this point. ==========================================================  - Avoid Sweets, Candy & White Stuff   - Rice, Potatoes, Breads &  Pasta ==========================================================  -  Vitamin D = 89 - Excellent   - Vitamin D goal is between 70-100.   - It is very  important as a natural anti-inflammatory and helping the  immune system protect against viral infections, like the Covid-19    helping hair, skin, and nails, as well as reducing stroke and  heart attack risk.   - It helps your bones and helps with mood.  - It also decreases numerous cancer risks so please  take it as directed.   - Low Vit D is associated with a 200-300% higher risk for  CANCER   and 200-300% higher risk for HEART   ATTACK  &  STROKE.    - It is also associated with higher death rate at younger ages,   autoimmune diseases like Rheumatoid arthritis, Lupus,  Multiple Sclerosis.     - Also many other serious conditions, like depression, Alzheimer's  Dementia, infertility, muscle aches, fatigue, fibromyalgia   - just to name a few. ==========================================================  - All Else - CBC - Kidneys - Electrolytes - Liver - Magnesium & Thyroid    - all  Normal / OK ==========================================================   - Keep up the [place] Work  ! ==========================================================

## 2020-09-30 ENCOUNTER — Other Ambulatory Visit: Payer: Self-pay

## 2020-09-30 ENCOUNTER — Encounter: Payer: Self-pay | Admitting: Podiatry

## 2020-09-30 ENCOUNTER — Ambulatory Visit (INDEPENDENT_AMBULATORY_CARE_PROVIDER_SITE_OTHER): Payer: PPO | Admitting: Podiatry

## 2020-09-30 DIAGNOSIS — Z9889 Other specified postprocedural states: Secondary | ICD-10-CM

## 2020-09-30 DIAGNOSIS — M2041 Other hammer toe(s) (acquired), right foot: Secondary | ICD-10-CM

## 2020-09-30 NOTE — Progress Notes (Signed)
She presents today 2 weeks status post hammertoe repair fifth right.  She denies fever chills nausea vomiting muscle aches pains calf pain back pain.  Objective: Dry sterile dressing intact was removed demonstrates no erythematous mild edema no cellulitis drainage or odor.  Sutures are intact margins are well coapted there is no signs of infection.  Assessment: Wound healing derotational arthroplasty fifth digit right foot.  Plan: Discussed etiology pathology conservative surgical therapies at this point went ahead and remove the sutures today demonstrated to her and her husband how to wrap the toe with Coban.  She will continue to do so until follow-up with her in 2 weeks she will continue the use of her Darco shoe as well.

## 2020-10-01 ENCOUNTER — Other Ambulatory Visit: Payer: Self-pay | Admitting: Internal Medicine

## 2020-10-01 DIAGNOSIS — B3789 Other sites of candidiasis: Secondary | ICD-10-CM

## 2020-10-08 DIAGNOSIS — M4317 Spondylolisthesis, lumbosacral region: Secondary | ICD-10-CM | POA: Diagnosis not present

## 2020-10-14 ENCOUNTER — Encounter: Payer: Self-pay | Admitting: Podiatry

## 2020-10-14 ENCOUNTER — Ambulatory Visit (INDEPENDENT_AMBULATORY_CARE_PROVIDER_SITE_OTHER): Payer: PPO | Admitting: Podiatry

## 2020-10-14 ENCOUNTER — Other Ambulatory Visit: Payer: Self-pay

## 2020-10-14 DIAGNOSIS — Z9889 Other specified postprocedural states: Secondary | ICD-10-CM

## 2020-10-14 NOTE — Progress Notes (Signed)
She presents today for follow-up of her hammertoe repair 09/12/2020 fifth digit right foot.  States that if I bump it or touch it hard it still painful she continues to wear her Darco shoe.  Objective: Vital signs are stable she alert oriented x3 there is mild edema no erythema cellulitis drainage or odor mildly tender on palpation.  Assessment: Well-healing surgical toe fifth right.  Plan: Continue to keep the toe wrapped on a daily basis leave open at bedtime.  Recommended that she wear regular socks and shoes as much as possible.  I did recommend for her to massage the toe and also contrast baths.  I will follow-up with her in 1 month at which time another x-ray will be performed.

## 2020-10-28 ENCOUNTER — Encounter: Payer: PPO | Admitting: Podiatry

## 2020-11-11 ENCOUNTER — Other Ambulatory Visit: Payer: Self-pay

## 2020-11-11 ENCOUNTER — Telehealth (INDEPENDENT_AMBULATORY_CARE_PROVIDER_SITE_OTHER): Payer: PPO | Admitting: Psychiatry

## 2020-11-11 ENCOUNTER — Encounter (HOSPITAL_COMMUNITY): Payer: Self-pay | Admitting: Psychiatry

## 2020-11-11 VITALS — Wt 124.0 lb

## 2020-11-11 DIAGNOSIS — F331 Major depressive disorder, recurrent, moderate: Secondary | ICD-10-CM | POA: Diagnosis not present

## 2020-11-11 DIAGNOSIS — F411 Generalized anxiety disorder: Secondary | ICD-10-CM | POA: Diagnosis not present

## 2020-11-11 DIAGNOSIS — F410 Panic disorder [episodic paroxysmal anxiety] without agoraphobia: Secondary | ICD-10-CM

## 2020-11-11 MED ORDER — LAMOTRIGINE 25 MG PO TABS: 25.0000 mg | ORAL_TABLET | Freq: Two times a day (BID) | ORAL | 2 refills | Status: DC

## 2020-11-11 MED ORDER — AMITRIPTYLINE HCL 25 MG PO TABS: 25.0000 mg | ORAL_TABLET | Freq: Every day | ORAL | 0 refills | Status: DC

## 2020-11-11 MED ORDER — CLONAZEPAM 0.5 MG PO TABS
ORAL_TABLET | ORAL | 0 refills | Status: DC
Start: 2020-11-11 — End: 2021-02-03

## 2020-11-11 MED ORDER — DULOXETINE HCL 40 MG PO CPEP
40.0000 mg | ORAL_CAPSULE | Freq: Every day | ORAL | 0 refills | Status: DC
Start: 2020-11-11 — End: 2021-02-03

## 2020-11-11 NOTE — Progress Notes (Signed)
Virtual Visit via Telephone Note  I connected with Sarah Rodriguez on 11/11/20 at 11:00 AM EST by telephone and verified that I am speaking with the correct person using two identifiers.  Location: Patient: Home Provider: Home Office   I discussed the limitations, risks, security and privacy concerns of performing an evaluation and management service by telephone and the availability of in person appointments. I also discussed with the patient that there may be a patient responsible charge related to this service. The patient expressed understanding and agreed to proceed.   History of Present Illness: Patient is evaluated by phone session.  She is on the phone by herself.  She got booster dose vaccine yesterday and she is having hard time today.  She is complaining of headaches, shivering, feeling tired and exhausted.  However she realized it is due to do vaccine booster and hopefully she will get better.  On the last visit we recommend to increase Lamictal but she tried 100 mg and it make her very sleepy next day.  She is back on Lamictal 25 mg twice a day.  She has no rash, itching, shakes.  However she does feel tired and sometimes overwhelmed.  She endorsed it is situational because she is taking care of her father's remaining and going to the properties.  She told Christmas is sad and sometimes she has crying spells but denies any suicidal thoughts or homicidal thoughts.  Recently she had blood work.  Her pulse is slow and she is not sure why the pulse is low and she forgot to discuss with her PCP.  I encouraged to call her PCP about her low pulse.  She lives with her husband is very supportive.  She is hoping to visit her daughter who lives few hours away around Christmas.  She has one minor panic attack and she like to have refills of the Klonopin in case she needed while she is traveling.  She has foot pain but no new issues.  Her appetite is okay.  Her weight is stable.  She denies any suicidal  thoughts.   Past Psychiatric History: H/Odepression and taking antidepressants since 1996. H/Opanic attacks while driving. SawDr. Letta Moynahan and tried Prozac, Paxil, Zoloft, Xanax, Cymbalta and Klonopin. No h/oinpatient treatment or any suicidal attempt.  Recent Results (from the past 2160 hour(s))  Urinalysis, Routine w reflex microscopic     Status: Abnormal   Collection Time: 09/24/20  2:08 PM  Result Value Ref Range   Color, Urine DARK YELLOW YELLOW   APPearance CLEAR CLEAR   Specific Gravity, Urine 1.027 1.001 - 1.03   pH 7.0 5.0 - 8.0   Glucose, UA NEGATIVE NEGATIVE   Bilirubin Urine NEGATIVE NEGATIVE    Comment: Verified by repeat analysis. Marland Kitchen    Ketones, ur TRACE (A) NEGATIVE   Hgb urine dipstick NEGATIVE NEGATIVE   Protein, ur NEGATIVE NEGATIVE   Nitrite NEGATIVE NEGATIVE   Leukocytes,Ua NEGATIVE NEGATIVE  Microalbumin / creatinine urine ratio     Status: None   Collection Time: 09/24/20  2:08 PM  Result Value Ref Range   Creatinine, Urine 121 20 - 275 mg/dL   Microalb, Ur 0.6 mg/dL    Comment: Reference Range Not established    Microalb Creat Ratio 5 <30 mcg/mg creat    Comment: . The ADA defines abnormalities in albumin excretion as follows: Marland Kitchen Albuminuria Category        Result (mcg/mg creatinine) . Normal to Mildly increased   <30 Moderately  increased         30-299  Severely increased           > OR = 300 . The ADA recommends that at least two of three specimens collected within a 3-6 month period be abnormal before considering a patient to be within a diagnostic category.   CBC with Differential/Platelet     Status: None   Collection Time: 09/24/20  2:08 PM  Result Value Ref Range   WBC 6.0 3.8 - 10.8 Thousand/uL   RBC 4.32 3.80 - 5.10 Million/uL   Hemoglobin 13.8 11.7 - 15.5 g/dL   HCT 40.7 35.0 - 45.0 %   MCV 94.2 80.0 - 100.0 fL   MCH 31.9 27.0 - 33.0 pg   MCHC 33.9 32.0 - 36.0 g/dL   RDW 11.5 11.0 - 15.0 %   Platelets 257 140 -  400 Thousand/uL   MPV 10.6 7.5 - 12.5 fL   Neutro Abs 3,582 1,500 - 7,800 cells/uL   Lymphs Abs 1,782 850 - 3,900 cells/uL   Absolute Monocytes 468 200 - 950 cells/uL   Eosinophils Absolute 138 15 - 500 cells/uL   Basophils Absolute 30 0 - 200 cells/uL   Neutrophils Relative % 59.7 %   Total Lymphocyte 29.7 %   Monocytes Relative 7.8 %   Eosinophils Relative 2.3 %   Basophils Relative 0.5 %  COMPLETE METABOLIC PANEL WITH GFR     Status: Abnormal   Collection Time: 09/24/20  2:08 PM  Result Value Ref Range   Glucose, Bld 107 (H) 65 - 99 mg/dL    Comment: .            Fasting reference interval . For someone without known diabetes, a glucose value between 100 and 125 mg/dL is consistent with prediabetes and should be confirmed with a follow-up test. .    BUN 23 7 - 25 mg/dL   Creat 0.94 0.50 - 0.99 mg/dL    Comment: For patients >68 years of age, the reference limit for Creatinine is approximately 13% higher for people identified as African-American. .    GFR, Est Non African American 65 > OR = 60 mL/min/1.69m2   GFR, Est African American 75 > OR = 60 mL/min/1.45m2   BUN/Creatinine Ratio NOT APPLICABLE 6 - 22 (calc)   Sodium 139 135 - 146 mmol/L   Potassium 5.2 3.5 - 5.3 mmol/L   Chloride 101 98 - 110 mmol/L   CO2 30 20 - 32 mmol/L   Calcium 9.9 8.6 - 10.4 mg/dL   Total Protein 6.6 6.1 - 8.1 g/dL   Albumin 4.4 3.6 - 5.1 g/dL   Globulin 2.2 1.9 - 3.7 g/dL (calc)   AG Ratio 2.0 1.0 - 2.5 (calc)   Total Bilirubin 0.4 0.2 - 1.2 mg/dL   Alkaline phosphatase (APISO) 57 37 - 153 U/L   AST 21 10 - 35 U/L   ALT 17 6 - 29 U/L  Magnesium     Status: None   Collection Time: 09/24/20  2:08 PM  Result Value Ref Range   Magnesium 2.4 1.5 - 2.5 mg/dL  Lipid panel     Status: Abnormal   Collection Time: 09/24/20  2:08 PM  Result Value Ref Range   Cholesterol 171 <200 mg/dL   HDL 40 (L) > OR = 50 mg/dL   Triglycerides 237 (H) <150 mg/dL    Comment: . If a non-fasting specimen  was collected, consider repeat triglyceride testing on a fasting specimen  if clinically indicated.  Yates Decamp et al. J. of Clin. Lipidol. 1610;9:[id number]. Marland Kitchen    LDL Cholesterol (Calc) 95 mg/dL (calc)    Comment: Reference range: <100 . Desirable range <100 mg/dL for primary prevention;   <70 mg/dL for patients with CHD or diabetic patients  with > or = 2 CHD risk factors. Marland Kitchen LDL-C is now calculated using the Martin-Hopkins  calculation, which is a validated novel method providing  better accuracy than the Friedewald equation in the  estimation of LDL-C.  Cresenciano Genre et al. Annamaria Helling. 9811;914(78): 2061-2068  (http://education.[facility].com/faq/FAQ164)    Total CHOL/HDL Ratio 4.3 <5.0 (calc)   Non-HDL Cholesterol (Calc) 131 (H) <130 mg/dL (calc)    Comment: For patients with diabetes plus 1 major ASCVD risk  factor, treating to a non-HDL-C goal of <100 mg/dL  (LDL-C of <70 mg/dL) is considered a therapeutic  option.   TSH     Status: None   Collection Time: 09/24/20  2:08 PM  Result Value Ref Range   TSH 1.14 0.40 - 4.50 mIU/L  Hemoglobin A1c     Status: Abnormal   Collection Time: 09/24/20  2:08 PM  Result Value Ref Range   Hgb A1c MFr Bld 5.8 (H) <5.7 % of total Hgb    Comment: For someone without known diabetes, a hemoglobin  A1c value between 5.7% and 6.4% is consistent with prediabetes and should be confirmed with a  follow-up test. . For someone with known diabetes, a value <7% indicates that their diabetes is well controlled. A1c targets should be individualized based on duration of diabetes, age, comorbid conditions, and other considerations. . This assay result is consistent with an increased risk of diabetes. . Currently, no consensus exists regarding use of hemoglobin A1c for diagnosis of diabetes for children. .    Mean Plasma Glucose 120 (calc)   eAG (mmol/L) 6.6 (calc)  Insulin, random     Status: None   Collection Time: 09/24/20  2:08 PM  Result  Value Ref Range   Insulin 13.4 uIU/mL    Comment:      Reference Range  < or = 19.6 .      Risk:      Optimal          < or = 19.6      Moderate         NA      High             >19.6 .      Adult cardiovascular event risk category      cut points (optimal, moderate, high)      are based on [employer] population      data from 10/2010. . This insulin assay shows strong cross-reactivity for some insulin analogs (lispro, aspart, and glargine) and much lower cross-reactivity with others (detemir, glulisine).   VITAMIN D 25 Hydroxy (Vit-D Deficiency, Fractures)     Status: None   Collection Time: 09/24/20  2:08 PM  Result Value Ref Range   Vit D, 25-Hydroxy 89 30 - 100 ng/mL    Comment: Vitamin D Status         25-OH Vitamin D: . Deficiency:                    <20 ng/mL Insufficiency:             20 - 29 ng/mL Optimal:                 >  or = 30 ng/mL . For 25-OH Vitamin D testing on patients on  D2-supplementation and patients for whom quantitation  of D2 and D3 fractions is required, the [facility](TM) 25-OH VIT D, (D2,D3), LC/MS/MS is recommended: order  code [phone] (patients >20yrs). See Note 1 . Note 1 . For additional information, please refer to  http://education.[facility].com/faq/FAQ199  (This link is being provided for informational/ educational purposes only.)     Psychiatric Specialty Exam: Physical Exam  Review of Systems  There were no vitals taken for this visit.There is no height or weight on file to calculate BMI.  General Appearance: NA  Eye Contact:  NA  Speech:  Slow  Volume:  Normal  Mood:  Dysphoric and tired  Affect:  NA  Thought Process:  Goal Directed  Orientation:  Full (Time, Place, and Person)  Thought Content:  Rumination  Suicidal Thoughts:  No  Homicidal Thoughts:  No  Memory:  Immediate;   Good Recent;   Good Remote;   Good  Judgement:  Intact  Insight:  Present  Psychomotor Activity:  NA  Concentration:   Concentration: Good and Attention Span: Good  Recall:  Good  Fund of Knowledge:  Good  Language:  Good  Akathisia:  No  Handed:  Right  AIMS (if indicated):     Assets:  Communication Skills Desire for Improvement Housing Resilience Social Support  ADL's:  Intact  Cognition:  WNL  Sleep:   ok      Assessment and Plan: Major depressive disorder, recurrent.  Generalized anxiety disorder.  Panic attacks.  Patient could not tolerate higher dose of Lamictal and back to 25 mg twice a day.  She is comfortable with the current dose of Lamictal.  I encouraged to call her PCP if she is worried about low pulse as she feels it making her tired.  She is taking losartan for blood pressure.  She promised that she will give a call to her PCP.  She does not want to change or add more medication and she realized her anxiety is situational and hopefully will get better.  Continue Cymbalta 40 mg daily, amitriptyline 25 mg at bedtime and we will keep the Lamictal 25 mg twice a day.  Discussed medication side effects and benefits.  Recommended to call us back if she has any question or any concern.  She has not started therapy with Eustaquio Maize because she is hoping insurance will resolve the issue about not paying to the therapist services.  Follow-up in 3 months.  Follow Up Instructions:    I discussed the assessment and treatment plan with the patient. The patient was provided an opportunity to ask questions and all were answered. The patient agreed with the plan and demonstrated an understanding of the instructions.   The patient was advised to call back or seek an in-person evaluation if the symptoms worsen or if the condition fails to improve as anticipated.  I provided 19 minutes of non-face-to-face time during this encounter.   Kathlee Nations, MD

## 2020-11-13 ENCOUNTER — Encounter: Payer: Self-pay | Admitting: Podiatry

## 2020-11-13 ENCOUNTER — Other Ambulatory Visit: Payer: Self-pay

## 2020-11-13 ENCOUNTER — Ambulatory Visit (INDEPENDENT_AMBULATORY_CARE_PROVIDER_SITE_OTHER): Payer: PPO | Admitting: Podiatry

## 2020-11-13 ENCOUNTER — Ambulatory Visit (INDEPENDENT_AMBULATORY_CARE_PROVIDER_SITE_OTHER): Payer: PPO

## 2020-11-13 DIAGNOSIS — M2041 Other hammer toe(s) (acquired), right foot: Secondary | ICD-10-CM

## 2020-11-13 DIAGNOSIS — Z9889 Other specified postprocedural states: Secondary | ICD-10-CM

## 2020-11-13 NOTE — Progress Notes (Signed)
She presents today for her final postop visit regarding her fifth toe of her right foot.  She states that is still swollen and painful and she is into regular shoes now.  Objective: Vital signs are stable she is alert oriented x3 there is no erythema to some mild edema no cellulitis drainage or odor.  Incision is gone to heal uneventfully.  Assessment: Well-healing surgical foot is moderately edematous.  Plan: Get back to her regular routine I explained to her that it can take 6 or 7 months before this would be completely normal she understands and is amenable to it we will follow-up with me as needed.

## 2020-11-30 ENCOUNTER — Other Ambulatory Visit: Payer: Self-pay | Admitting: Internal Medicine

## 2020-11-30 DIAGNOSIS — K219 Gastro-esophageal reflux disease without esophagitis: Secondary | ICD-10-CM

## 2020-12-04 ENCOUNTER — Ambulatory Visit: Payer: PPO | Admitting: Adult Health

## 2020-12-11 ENCOUNTER — Other Ambulatory Visit: Payer: Self-pay | Admitting: Internal Medicine

## 2020-12-11 DIAGNOSIS — I10 Essential (primary) hypertension: Secondary | ICD-10-CM

## 2020-12-15 ENCOUNTER — Other Ambulatory Visit: Payer: Self-pay | Admitting: Internal Medicine

## 2020-12-30 DIAGNOSIS — H903 Sensorineural hearing loss, bilateral: Secondary | ICD-10-CM | POA: Diagnosis not present

## 2021-01-01 ENCOUNTER — Other Ambulatory Visit: Payer: Self-pay | Admitting: Internal Medicine

## 2021-01-01 MED ORDER — LEVOTHYROXINE SODIUM 50 MCG PO TABS: ORAL_TABLET | ORAL | 1 refills | Status: DC

## 2021-01-05 ENCOUNTER — Other Ambulatory Visit: Payer: Self-pay | Admitting: Internal Medicine

## 2021-01-05 DIAGNOSIS — I10 Essential (primary) hypertension: Secondary | ICD-10-CM

## 2021-01-05 NOTE — Progress Notes (Signed)
MEDICARE ANNUAL WELLNESS VISIT AND FOLLOW UP    Encounter for Medicare annual wellness exam  Migraine with aura and without status migrainosus, not intractable Well managed by PRN OTC analgesic and phenergan Given decadron for possible sinus migraine Given sample in office of Nurtec patient to go to ER if there is weakness, thunderclap headache, visual changes, or any concerning factors  Essential hypertension Stop atenolol, increase losartan due to bradycardia with fatigue  Monitor blood pressure at home; call if consistently over 130/80 Continue DASH diet.   Reminder to go to the ER if any CP, SOB, nausea, dizziness, severe HA, changes vision/speech, left arm numbness and tingling and jaw pain.  Uncomplicated asthma, unspecified asthma severity, unspecified whether persistent Well controlled on inhalers without recent flare  Gastroesophageal reflux disease, esophagitis presence not specified Fairly managed on current medications;  Discussed diet, avoiding triggers and other lifestyle changes  Hypothyroidism, unspecified typ continue medications the same pending lab results reminded to take on an empty stomach 30-42mins before food.  check TSH level  Spondylolisthesis at L4-L5 level S/p fusion for repair Followed by Dr. Langston Masker  Primary osteoarthritis involving multiple joints Well managed by motrin/tylenol/lyrica  Vitamin D deficiency At goal at recent check; continue to recommend supplementation Defer vitamin D level  Prediabetes Discussed disease and risks Discussed diet/exercise, weight management  Check A1C q81m; defer today; monitor serum glucose   Medication management CBC, CMP/GFR  Hyperlipidemia, mixed Continue medications: atorvastatin Continue low cholesterol diet and exercise.  Check lipid panel.   Depression, major, recurrent, in partial remission (HCC) Followed by Dr. Lolly Mustache Continue medications, reminded to limit benzo use to <5 days a week if  possible to avoid dependence  Lifestyle discussed: diet/exerise, sleep hygiene, stress management, hydration  Chronic back pain, unspecified back location, unspecified back pain laterality Continue follow up with Dr. Jani Files lyrica for back pain  BMI 22, adult Continue to recommend diet heavy in fruits and veggies and low in animal meats, cheeses, and dairy products, appropriate calorie intake Discuss exercise recommendations routinely Continue to monitor weight at each visit  Fatigue/bradycardia Check labs; taking atenolol 25 mg daily; stop and increase losartan to full tab Check BPs and pulse at home Follow up with values in 2 weeks  Murmur Mild, benign characteristics; per patient has had intermittently; monitor Refer for ECHO if persistent fatigue or progressive    Over 40 minutes of exam, counseling, chart review and critical decision making was performed Future Appointments  Date Time Provider Department Center  02/03/2021 11:00 AM Arfeen, Phillips Grout, MD [facility] None  04/16/2021 10:30 AM Lucky Cowboy, MD [facility] None  09/30/2021  3:00 PM Lucky Cowboy, MD [facility] None  01/06/2022 11:00 AM Judd Gaudier, NP [facility] None     Plan:   During the course of the visit the patient was educated and counseled about appropriate screening and preventive services including:    Pneumococcal vaccine   Prevnar 13  Influenza vaccine  Td vaccine  Screening electrocardiogram  Bone densitometry screening  Colorectal cancer screening  Diabetes screening  Glaucoma screening  Nutrition counseling   Advanced directives: requested   Subjective:  Sarah Rodriguez is a 65 y.o. female who presents for Medicare Annual Wellness Visit and 3 month follow up. Patient has GERD controlled on her meds.   She was referred to Dr. Irene Limbo for TMJ who found a mild sleep apnea and was recommended an oral device and has been wearing last 2-3 months with perceived  improvement.  She also has Chronic Pain Syndrome consequent of Cx DDD and Cx HNP surg in 2011 and then in 2013, she underwent L5/S1 Surg by Dr Ellene Route and lastly in Nov 2016, she had a Lumbar fusion. She is able to control her pain on lyrica and has avoided Opioids. Does have unsteady gait, has completed PT.   She has hx of migraines; reports rare, typically more sinus. Typically does well with 1/2 tab excedrine. She does have nurtec samples but hasn't needed to use.    She has history of asthma/allergies, states consistently has cough. She has had some sinus congestion/left side worse. Has seen ENT, flonase recommended.    she has a diagnosis of depression, follows with Dr. Adele Schilder currently fairly controlled on lamictal and amitryptiline, and currently takes klonopin 0.25-0.5 mg BID PRN anxiety and insomnia, reports symptoms are well controlled on current regimen. she takes 0.125 mg of klonopin at night for sleep, uses rarely during the day for anxiety.   BMI is Body mass index is 22.31 kg/m., she admits needs to do better on diet and exercise. Not exercising as much with cold weather, tries to go up and down strairs more often in home. Wt Readings from Last 3 Encounters:  01/06/21 120 lb (54.4 kg)  09/24/20 124 lb 12.8 oz (56.6 kg)  06/12/20 120 lb 12.8 oz (54.8 kg)   . Her blood pressure has been controlled at home, today their BP is BP: 130/80 She does workout. She denies chest pain, shortness of breath, dizziness. She has had some fatigue, "no get up an go" has noted lower pulses at home, interested in stopping atenolol. Currently taking 25 mg only, also taking 1/2 tab 50 mg losartan. Reports pulses at home have typically remains in 64s. Last EKG 10/21/2020 showed borderline 1st degree AV block.   She is on cholesterol medication (recently transitioned to rosuvastatin 40 mg Tues, Thurs, was having cramping with higher doses) and denies myalgias. Her cholesterol is not at goal. The  cholesterol last visit was:   Lab Results  Component Value Date   CHOL 171 09/24/2020   HDL 40 (L) 09/24/2020   LDLCALC 95 09/24/2020   TRIG 237 (H) 09/24/2020   CHOLHDL 4.3 09/24/2020    She has been working on diet and exercise for prediabetes, and denies foot ulcerations, increased appetite, nausea, polydipsia, polyuria, visual disturbances, vomiting and weight loss. Last A1C in the office was:  Lab Results  Component Value Date   HGBA1C 5.8 (H) 09/24/2020   She is on thyroid medication. Her medication was not changed last visit. Taking 1/2 tab 50 mcg daily.  Lab Results  Component Value Date   TSH 1.14 09/24/2020   Last GFR: Lab Results  Component Value Date   GFRNONAA 65 09/24/2020   Patient is on Vitamin D supplement, taking 4000 IU daily, was off at last visit.    Lab Results  Component Value Date   VD25OH 89 09/24/2020       Medication Review: Current Outpatient Medications on File Prior to Visit  Medication Sig Dispense Refill  . albuterol (PROAIR HFA) 108 (90 Base) MCG/ACT inhaler Inhale 2 puffs into the lungs every 6 (six) hours as needed for wheezing or shortness of breath. 8 g 1  . ADVAIR HFA 115-21 MCG/ACT inhaler INHALE 2 PUFFS BY MOUTH EVERY 12 HOURS 10 TO 15 MINUTES APART (Patient not taking: Reported on 01/06/2021) 36 g 0  . amitriptyline (ELAVIL) 25 MG tablet Take 1 tablet (25 mg  total) by mouth at bedtime. 90 tablet 0  . Cholecalciferol (VITAMIN D PO) Take 4,000 Units by mouth daily.     Marland Kitchen CINNAMON PO Take 1,000 Units by mouth 2 (two) times daily.     . clonazePAM (KLONOPIN) 0.5 MG tablet Take 1/2 to 1 tablet as needed for severe anxiety. 20 tablet 0  . diclofenac sodium (VOLTAREN) 1 % GEL Apply 4 g topically 4 (four) times daily. 100 g 3  . DULoxetine HCl 40 MG CPEP Take 40 mg by mouth daily. 90 capsule 0  . fexofenadine-pseudoephedrine (ALLEGRA-D 12 HOUR) 60-120 MG per tablet Take 1 tablet by mouth daily.    . fluticasone (FLONASE) 50 MCG/ACT nasal spray  INSTILL 2 SPRAYS IN EACH NOSTRIL EVERY EVENING 48 g 3  . halobetasol (ULTRAVATE) 0.05 % ointment   3  . lamoTRIgine (LAMICTAL) 25 MG tablet Take 1 tablet (25 mg total) by mouth 2 (two) times daily. 60 tablet 2  . levothyroxine (SYNTHROID) 50 MCG tablet Take 1/2 tablet  Daily  on an empty stomach with only water for 30 minutes & no Antacid meds, Calcium or Magnesium for 4 hours & avoid Biotin 90 tablet 1  . losartan (COZAAR) 50 MG tablet TAKE 1 TABLET BY MOUTH DAILY FOR BLOOD PRESSURE 90 tablet 3  . Magnesium 250 MG TABS Take 3 tablets by mouth daily.     . meloxicam (MOBIC) 15 MG tablet Take 1 tablet Daily with Food for Pain & Inflammation 90 tablet 3  . montelukast (SINGULAIR) 10 MG tablet Take   1 tablet   Daily   for Allergies 90 tablet 0  . nystatin (MYCOSTATIN) 100000 UNIT/ML suspension Swish  &  Swallow     1 Teaspoon      4 x /day      for Oral Thrush (Yeast ) Infection 480 mL 0  . ondansetron (ZOFRAN) 4 MG tablet Take 1 tablet (4 mg total) by mouth every 8 (eight) hours as needed. 20 tablet 0  . pantoprazole (PROTONIX) 40 MG tablet Take     1 tablet      Daily        to Prevent Heartburn & Indigestion 180 tablet 0  . pregabalin (LYRICA) 50 MG capsule Take 50 mg by mouth at bedtime.    Marland Kitchen PREMARIN vaginal cream INSERT 1/2 GRAM VAGINALLY VIA APPLICATOR NIGHTLY FOR W EEKS THEN JUST DO TEICE A WEEK  11  . Rimegepant Sulfate (NURTEC) 75 MG TBDP Take 75 mg by mouth as needed (migraine). 2 tablet 0   No current facility-administered medications on file prior to visit.    Allergies  Allergen Reactions  . Demerol [Meperidine] Other (See Comments)    REACTION: unknown--itching  . Epinephrine Other (See Comments)    "Sensitivity."  . Niacin And Related Rash    Current Problems (verified) Patient Active Problem List   Diagnosis Date Noted  . Sleep apnea 06/12/2020  . Deviated septum 03/14/2018  . Chronic back pain 12/21/2017  . Depression, major, recurrent, in partial remission ([place])  08/05/2016  . Degenerative joint disease 08/05/2016  . Migraine 03/03/2016  . Encounter for Medicare annual wellness exam 02/16/2016  . Spondylolisthesis at L4-L5 level 10/27/2015  . Prediabetes 12/12/2014  . Vitamin D deficiency 04/17/2014  . Medication management 04/17/2014  . Hyperlipidemia, mixed   . Hypertension   . Asthma   . GERD (gastroesophageal reflux disease)   . Hypothyroidism     Screening Tests Immunization History  Administered Date(s) Administered  .  Influenza Inj Mdck Quad Pf 08/27/2019  . Influenza Inj Mdck Quad With Preservative 09/14/2017, 10/19/2018, 09/24/2020  . Influenza,inj,quad, With Preservative 11/09/2016  . Influenza-Unspecified 09/18/2013  . Moderna Sars-Covid-2 Vaccination 02/15/2020, 03/18/2020, 11/10/2020  . PPD Test 04/17/2014  . Pneumococcal-Unspecified 11/30/1995  . Td 08/27/2005, 05/05/2016    Preventative care: Last colonoscopy: 2014 Dr. Cristina Gong, polyp, due 2019, reports has upcoming appointment 12/2020 Last mammogram: 09/2019 at Dr. Valentino Saxon, reports will get this year at GYN Last pap smear/pelvic exam: at GYN, following annually DEXA: GYN is following, plan to start age 65 Stress test 2006 EGD 2014, Dr. Cristina Gong  Prior vaccinations: TD or Tdap: 2017 Influenza: 2021 Pneumococcal: 1997 Shingles/Zostavax: declines due to cost   Names of Other Physician/Practitioners you currently use: 1. [facility]- here for primary care 2. Dr. [place], eye doctor, last visit 2022, wears glasses.  3. Dr. Carlye Grippe, dentist, last visit 2021  Patient Care Team: Unk Pinto, MD as PCP - General (Internal Medicine) Kristeen Miss, MD as Consulting Physician (Neurosurgery) Kathlee Nations, MD as Consulting Physician (Psychiatry)  SURGICAL HISTORY She  has a past surgical history that includes Dilation and curettage of uterus (2011); Ablation (2011); Cholecystectomy (2008); Tonsillectomy; buttocks surgery;  Cesarean section (1992); right knee arthroscopy; Cervical fusion (03/2010); Colonoscopy; Esophagogastroduodenoscopy; and Lumbar laminectomy/decompression microdiscectomy (10/24/2012). FAMILY HISTORY Her family history includes Allergies in her mother; Arthritis in her mother; Depression in her mother; Heart attack in her father and mother; Hypertension in her mother. SOCIAL HISTORY She  reports that she has quit smoking. She has never used smokeless tobacco. She reports that she does not drink alcohol and does not use drugs.  MEDICARE WELLNESS OBJECTIVES: Physical activity: Current Exercise Habits: Home exercise routine, Type of exercise: walking;strength training/weights, Time (Minutes): 30, Frequency (Times/Week): 7, Weekly Exercise (Minutes/Week): 210, Intensity: Mild, Exercise limited by: orthopedic condition(s) Cardiac risk factors: Cardiac Risk Factors include: advanced age (>24men, >5 women);dyslipidemia;hypertension;smoking/ tobacco exposure Depression/mood screen:   Depression screen [facility] 2/9 01/06/2021  Decreased Interest 1  Down, Depressed, Hopeless 2  PHQ - 2 Score 3  Altered sleeping 2  Tired, decreased energy 3  Change in appetite 0  Feeling bad or failure about yourself  1  Trouble concentrating 1  Moving slowly or fidgety/restless 0  Suicidal thoughts 0  PHQ-9 Score 10  Difficult doing work/chores Somewhat difficult    ADLs:  In your present state of health, do you have any difficulty performing the following activities: 01/06/2021 09/23/2020  Hearing? Y N  Comment planning on getting hearing aid -  Vision? N N  Difficulty concentrating or making decisions? N N  Walking or climbing stairs? N N  Dressing or bathing? N N  Doing errands, shopping? N N  Some recent data might be hidden     Cognitive Testing  Alert? Yes  Normal Appearance?Yes  Oriented to person? Yes  Place? Yes   Time? Yes  Recall of three objects?  Yes  Can perform simple calculations? Yes  Displays  appropriate judgment?Yes  Can read the correct time from a watch face?Yes  EOL planning: Does Patient Have a Medical Advance Directive?: Yes Type of Advance Directive: [place] will Does patient want to make changes to medical advance directive?: No - Patient declined Copy of [place] in Chart?: No - copy requested   Review of Systems  Constitutional: Positive for malaise/fatigue. Negative for weight loss.  HENT: Negative for hearing loss and tinnitus.   Eyes:  Negative for blurred vision and double vision.  Respiratory: Negative for cough, sputum production, shortness of breath and wheezing.   Cardiovascular: Negative for chest pain, palpitations, orthopnea, claudication, leg swelling and PND.  Gastrointestinal: Negative for abdominal pain, blood in stool, constipation, diarrhea, heartburn, melena, nausea and vomiting.  Genitourinary: Negative.   Musculoskeletal: Positive for back pain (intermittent, improved). Negative for falls, joint pain and myalgias.  Skin: Negative for rash.  Neurological: Negative for dizziness, tingling, sensory change, weakness and headaches.  Endo/Heme/Allergies: Negative for polydipsia.  Psychiatric/Behavioral: Positive for depression (chronic, psych following). Negative for memory loss, substance abuse and suicidal ideas. The patient is not nervous/anxious and does not have insomnia.   All other systems reviewed and are negative.    Objective:     Today's Vitals   01/06/21 1144  BP: 130/80  Pulse: (!) 52  Temp: (!) 97.5 F (36.4 C)  SpO2: 95%  Weight: 120 lb (54.4 kg)  Height: 5' 1.5" (1.562 m)   Body mass index is 22.31 kg/m.  General appearance: alert, no distress, WD/WN, female HEENT: normocephalic, sclerae anicteric, TMs pearly, nares patent, no discharge or erythema, dry/flaky skin in canals; mask in place;oral exam deferred Neck: supple, no lymphadenopathy, no thyromegaly, no masses Heart:  RRR, normal S1, S2, mild 2/6 systolic murmur over 2nd R ICS Lungs: CTA bilaterally, no wheezes, rhonchi, or rales Abdomen: +bs, soft, non tender, non distended, no masses, no hepatomegaly, no splenomegaly Musculoskeletal: nontender, no swelling, no obvious deformity Extremities: no edema, no cyanosis, no clubbing Pulses: 2+ symmetric, upper and lower extremities, normal cap refill Neurological: alert, oriented x 3, CN2-12 intact, strength normal upper extremities and lower extremities, sensation normal throughout, DTRs 2+ throughout, no cerebellar signs, gait normal/steady Psychiatric: normal affect, behavior normal, pleasant   Medicare Attestation I have personally reviewed: The patient's medical and social history Their use of alcohol, tobacco or illicit drugs Their current medications and supplements The patient's functional ability including ADLs,fall risks, home safety risks, cognitive, and hearing and visual impairment Diet and physical activities Evidence for depression or mood disorders  The patient's weight, height, BMI, and visual acuity have been recorded in the chart.  I have made referrals, counseling, and provided education to the patient based on review of the above and I have provided the patient with a written personalized care plan for preventive services.     Izora Ribas, NP   01/06/2021

## 2021-01-06 ENCOUNTER — Ambulatory Visit (INDEPENDENT_AMBULATORY_CARE_PROVIDER_SITE_OTHER): Payer: PPO | Admitting: Adult Health

## 2021-01-06 ENCOUNTER — Encounter: Payer: Self-pay | Admitting: Adult Health

## 2021-01-06 ENCOUNTER — Other Ambulatory Visit: Payer: Self-pay

## 2021-01-06 VITALS — BP 130/80 | HR 52 | Temp 97.5°F | Ht 61.5 in | Wt 120.0 lb

## 2021-01-06 DIAGNOSIS — G43109 Migraine with aura, not intractable, without status migrainosus: Secondary | ICD-10-CM | POA: Diagnosis not present

## 2021-01-06 DIAGNOSIS — R7303 Prediabetes: Secondary | ICD-10-CM | POA: Diagnosis not present

## 2021-01-06 DIAGNOSIS — Z79899 Other long term (current) drug therapy: Secondary | ICD-10-CM

## 2021-01-06 DIAGNOSIS — G4730 Sleep apnea, unspecified: Secondary | ICD-10-CM

## 2021-01-06 DIAGNOSIS — Z0001 Encounter for general adult medical examination with abnormal findings: Secondary | ICD-10-CM | POA: Diagnosis not present

## 2021-01-06 DIAGNOSIS — J342 Deviated nasal septum: Secondary | ICD-10-CM

## 2021-01-06 DIAGNOSIS — M159 Polyosteoarthritis, unspecified: Secondary | ICD-10-CM

## 2021-01-06 DIAGNOSIS — R6889 Other general symptoms and signs: Secondary | ICD-10-CM

## 2021-01-06 DIAGNOSIS — E782 Mixed hyperlipidemia: Secondary | ICD-10-CM

## 2021-01-06 DIAGNOSIS — M4316 Spondylolisthesis, lumbar region: Secondary | ICD-10-CM

## 2021-01-06 DIAGNOSIS — E039 Hypothyroidism, unspecified: Secondary | ICD-10-CM | POA: Diagnosis not present

## 2021-01-06 DIAGNOSIS — M8949 Other hypertrophic osteoarthropathy, multiple sites: Secondary | ICD-10-CM

## 2021-01-06 DIAGNOSIS — I10 Essential (primary) hypertension: Secondary | ICD-10-CM

## 2021-01-06 DIAGNOSIS — R053 Chronic cough: Secondary | ICD-10-CM

## 2021-01-06 DIAGNOSIS — R198 Other specified symptoms and signs involving the digestive system and abdomen: Secondary | ICD-10-CM

## 2021-01-06 DIAGNOSIS — K219 Gastro-esophageal reflux disease without esophagitis: Secondary | ICD-10-CM | POA: Diagnosis not present

## 2021-01-06 DIAGNOSIS — J45909 Unspecified asthma, uncomplicated: Secondary | ICD-10-CM

## 2021-01-06 DIAGNOSIS — E559 Vitamin D deficiency, unspecified: Secondary | ICD-10-CM

## 2021-01-06 DIAGNOSIS — G8929 Other chronic pain: Secondary | ICD-10-CM

## 2021-01-06 DIAGNOSIS — R490 Dysphonia: Secondary | ICD-10-CM

## 2021-01-06 DIAGNOSIS — H903 Sensorineural hearing loss, bilateral: Secondary | ICD-10-CM | POA: Diagnosis not present

## 2021-01-06 DIAGNOSIS — Z0000 Encounter for general adult medical examination without abnormal findings: Secondary | ICD-10-CM

## 2021-01-06 DIAGNOSIS — F3341 Major depressive disorder, recurrent, in partial remission: Secondary | ICD-10-CM

## 2021-01-06 MED ORDER — AEROCHAMBER MV MISC: 0 refills | Status: DC

## 2021-01-06 MED ORDER — ROSUVASTATIN CALCIUM 40 MG PO TABS
ORAL_TABLET | ORAL | 3 refills | Status: DC
Start: 2021-01-06 — End: 2021-02-10

## 2021-01-06 NOTE — Patient Instructions (Addendum)
Sarah Rodriguez , Thank you for taking time to come for your Medicare Wellness Visit. I appreciate your ongoing commitment to your health goals. Please review the following plan we discussed and let me know if I can assist you in the future.   These are the goals we discussed: Goals    . Blood Pressure < 130/80    . Exercise 150 min/wk Moderate Activity     Aim for 20-30 min of intentional exercise daily     . LDL CALC < 100       This is a list of the screening recommended for you and due dates:  Health Maintenance  Topic Date Due  . Mammogram  10/21/2020  . Colon Cancer Screening  10/04/2023  . Tetanus Vaccine  05/05/2026  . Flu Shot  Completed  . COVID-19 Vaccine  Completed  .  Hepatitis C: One time screening is recommended by Center for Disease Control  (CDC) for  adults born from [birth year] through 1964.   Completed  . HIV Screening  Completed    STOP atenolol -   Increase losartan to whole tab  Message me if blood pressures are persistently running 130/80+     Bradycardia, Adult Bradycardia is a slower-than-normal heartbeat. A normal resting heart rate for an adult ranges from 60 to 100 beats per minute. With bradycardia, the resting heart rate is less than 60 beats per minute. Bradycardia can prevent enough oxygen from reaching certain areas of your body when you are active. It can be serious if it keeps enough oxygen from reaching your brain and other parts of your body. Bradycardia is not a problem for everyone. For some healthy adults, a slow resting heart rate is normal. What are the causes? This condition may be caused by:  A problem with the heart, including: ? A problem with the heart's electrical system, such as a heart block. With a heart block, electrical signals between the chambers of the heart are partially or completely blocked, so they are not able to work as they should. ? A problem with the heart's natural pacemaker (sinus node). ? Heart disease. ? A heart  attack. ? Heart damage. ? Lyme disease. ? A heart infection. ? A heart condition that is present at birth (congenital heart defect).  Certain medicines that treat heart conditions.  Certain conditions, such as hypothyroidism and obstructive sleep apnea.  Problems with the balance of chemicals and other substances, like potassium, in the blood.  Trauma.  Radiation therapy.   What increases the risk? You are more likely to develop this condition if you:  Are age 74 or older.  Have high blood pressure (hypertension), high cholesterol (hyperlipidemia), or diabetes.  Drink heavily, use tobacco or nicotine products, or use drugs. What are the signs or symptoms? Symptoms of this condition include:  Light-headedness.  Feeling faint or fainting.  Fatigue and weakness.  Trouble with activity or exercise.  Shortness of breath.  Chest pain (angina).  Drowsiness.  Confusion.  Dizziness. How is this diagnosed? This condition may be diagnosed based on:  Your symptoms.  Your medical history.  A physical exam. During the exam, your health care provider will listen to your heartbeat and check your pulse. To confirm the diagnosis, your health care provider may order tests, such as:  Blood tests.  An electrocardiogram (ECG). This test records the heart's electrical activity. The test can show how fast your heart is beating and whether the heartbeat is steady.  A test  in which you wear a portable device (event recorder or Holter monitor) to record your heart's electrical activity while you go about your day.  Anexercise test. How is this treated? Treatment for this condition depends on the cause of the condition and how severe your symptoms are. Treatment may involve:  Treatment of the underlying condition.  Changing your medicines or how much medicine you take.  Having a small, battery-operated device called a pacemaker implanted under the skin. When bradycardia  occurs, this device can be used to increase your heart rate and help your heart beat in a regular rhythm. Follow these instructions at home: Lifestyle  Manage any health conditions that contribute to bradycardia as told by your health care provider.  Follow a heart-healthy diet. A nutrition specialist (dietitian) can help educate you about healthy food options and changes.  Follow an exercise program that is approved by your health care provider.  Maintain a healthy weight.  Try to reduce or manage your stress, such as with yoga or meditation. If you need help reducing stress, ask your health care provider.  Do not use any products that contain nicotine or tobacco, such as cigarettes, e-cigarettes, and chewing tobacco. If you need help quitting, ask your health care provider.  Do not use illegal drugs.  Limit alcohol intake to no more than 1 drink a day for nonpregnant women and 2 drinks a day for men. Be aware of how much alcohol is in your drink. In the [place] one drink equals one 12 oz bottle of beer (355 mL), one 5 oz glass of wine (148 mL), or one 1 oz glass of hard liquor (44 mL).   General instructions  Take over-the-counter and prescription medicines only as told by your health care provider.  Keep all follow-up visits as told by your health care provider. This is important. How is this prevented? In some cases, bradycardia may be prevented by:  Treating underlying medical problems.  Stopping behaviors or medicines that can trigger the condition. Contact a health care provider if you:  Feel light-headed or dizzy.  Almost faint.  Feel weak or are easily fatigued during physical activity.  Experience confusion or have memory problems. Get help right away if:  You faint.  You have: ? An irregular heartbeat (palpitations). ? Chest pain. ? Trouble breathing. Summary  Bradycardia is a slower-than-normal heartbeat. With bradycardia, the resting heart rate is less  than 60 beats per minute.  Treatment for this condition depends on the cause.  Manage any health conditions that contribute to bradycardia as told by your health care provider.  Do not use any products that contain nicotine or tobacco, such as cigarettes, e-cigarettes, and chewing tobacco, and limit alcohol intake.  Keep all follow-up visits as told by your health care provider. This is important. This information is not intended to replace advice given to you by your health care provider. Make sure you discuss any questions you have with your health care provider. Document Revised: 05/29/2018 Document Reviewed: 04/26/2018 Elsevier Patient Education  [place].

## 2021-01-07 ENCOUNTER — Other Ambulatory Visit: Payer: Self-pay | Admitting: Adult Health

## 2021-01-07 ENCOUNTER — Other Ambulatory Visit: Payer: Self-pay | Admitting: Internal Medicine

## 2021-01-07 DIAGNOSIS — R5383 Other fatigue: Secondary | ICD-10-CM

## 2021-01-07 DIAGNOSIS — J4520 Mild intermittent asthma, uncomplicated: Secondary | ICD-10-CM

## 2021-01-07 DIAGNOSIS — J45909 Unspecified asthma, uncomplicated: Secondary | ICD-10-CM

## 2021-01-07 LAB — COMPLETE METABOLIC PANEL WITH GFR
AG Ratio: 2.1 (calc) (ref 1.0–2.5)
ALT: 16 U/L (ref 6–29)
AST: 18 U/L (ref 10–35)
Albumin: 4.4 g/dL (ref 3.6–5.1)
Alkaline phosphatase (APISO): 55 U/L (ref 37–153)
BUN/Creatinine Ratio: 19 (calc) (ref 6–22)
BUN: 19 mg/dL (ref 7–25)
CO2: 31 mmol/L (ref 20–32)
Calcium: 9.9 mg/dL (ref 8.6–10.4)
Chloride: 104 mmol/L (ref 98–110)
Creat: 1 mg/dL — ABNORMAL HIGH (ref 0.50–0.99)
GFR, Est African American: 69 mL/min/{1.73_m2} (ref >=60)
GFR, Est Non African American: 60 mL/min/{1.73_m2} (ref >=60)
Globulin: 2.1 g/dL (calc) (ref 1.9–3.7)
Glucose, Bld: 118 mg/dL — ABNORMAL HIGH (ref 65–99)
Potassium: 4.4 mmol/L (ref 3.5–5.3)
Sodium: 142 mmol/L (ref 135–146)
Total Bilirubin: 0.4 mg/dL (ref 0.2–1.2)
Total Protein: 6.5 g/dL (ref 6.1–8.1)

## 2021-01-07 LAB — CBC WITH DIFFERENTIAL/PLATELET
Absolute Monocytes: 507 cells/uL (ref 200–950)
Basophils Absolute: 33 cells/uL (ref 0–200)
Basophils Relative: 0.5 %
Eosinophils Absolute: 150 cells/uL (ref 15–500)
Eosinophils Relative: 2.3 %
HCT: 39.4 % (ref 35.0–45.0)
Hemoglobin: 13.5 g/dL (ref 11.7–15.5)
Lymphs Abs: 1859 cells/uL (ref 850–3900)
MCH: 32 pg (ref 27.0–33.0)
MCHC: 34.3 g/dL (ref 32.0–36.0)
MCV: 93.4 fL (ref 80.0–100.0)
MPV: 10.6 fL (ref 7.5–12.5)
Monocytes Relative: 7.8 %
Neutro Abs: 3952 cells/uL (ref 1500–7800)
Neutrophils Relative %: 60.8 %
Platelets: 218 10*3/uL (ref 140–400)
RBC: 4.22 10*6/uL (ref 3.80–5.10)
RDW: 11.7 % (ref 11.0–15.0)
Total Lymphocyte: 28.6 %
WBC: 6.5 10*3/uL (ref 3.8–10.8)

## 2021-01-07 LAB — LIPID PANEL
Cholesterol: 141 mg/dL (ref <200)
HDL: 39 mg/dL — ABNORMAL LOW (ref >=50)
LDL Cholesterol (Calc): 75 mg/dL (calc)
Non-HDL Cholesterol (Calc): 102 mg/dL (calc) (ref <130)
Total CHOL/HDL Ratio: 3.6 (calc) (ref <5.0)
Triglycerides: 174 mg/dL — ABNORMAL HIGH (ref <150)

## 2021-01-07 LAB — TSH: TSH: 0.84 mIU/L (ref 0.40–4.50)

## 2021-01-07 LAB — MAGNESIUM: Magnesium: 2.2 mg/dL (ref 1.5–2.5)

## 2021-01-07 MED ORDER — ALBUTEROL SULFATE HFA 108 (90 BASE) MCG/ACT IN AERS
2.0000 | INHALATION_SPRAY | Freq: Four times a day (QID) | RESPIRATORY_TRACT | 1 refills | Status: DC | PRN
Start: 2021-01-07 — End: 2024-02-03

## 2021-01-07 MED ORDER — AEROCHAMBER MV MISC: 0 refills | Status: DC

## 2021-01-07 MED ORDER — MELOXICAM 15 MG PO TABS: ORAL_TABLET | ORAL | 0 refills | Status: DC

## 2021-01-07 MED ORDER — ADVAIR HFA 115-21 MCG/ACT IN AERO: INHALATION_SPRAY | RESPIRATORY_TRACT | 3 refills | Status: DC

## 2021-02-03 ENCOUNTER — Telehealth (INDEPENDENT_AMBULATORY_CARE_PROVIDER_SITE_OTHER): Payer: PPO | Admitting: Psychiatry

## 2021-02-03 ENCOUNTER — Encounter (HOSPITAL_COMMUNITY): Payer: Self-pay | Admitting: Psychiatry

## 2021-02-03 ENCOUNTER — Other Ambulatory Visit: Payer: Self-pay

## 2021-02-03 DIAGNOSIS — F331 Major depressive disorder, recurrent, moderate: Secondary | ICD-10-CM | POA: Diagnosis not present

## 2021-02-03 DIAGNOSIS — F411 Generalized anxiety disorder: Secondary | ICD-10-CM

## 2021-02-03 DIAGNOSIS — F410 Panic disorder [episodic paroxysmal anxiety] without agoraphobia: Secondary | ICD-10-CM

## 2021-02-03 MED ORDER — AMITRIPTYLINE HCL 25 MG PO TABS
25.0000 mg | ORAL_TABLET | Freq: Every day | ORAL | 0 refills | Status: DC
Start: 2021-02-03 — End: 2021-06-04

## 2021-02-03 MED ORDER — CLONAZEPAM 0.5 MG PO TABS: ORAL_TABLET | ORAL | 0 refills | Status: DC

## 2021-02-03 MED ORDER — DULOXETINE HCL 40 MG PO CPEP
40.0000 mg | ORAL_CAPSULE | Freq: Every day | ORAL | 0 refills | Status: DC
Start: 2021-02-03 — End: 2021-05-05

## 2021-02-03 MED ORDER — LAMOTRIGINE 25 MG PO TABS: 25.0000 mg | ORAL_TABLET | Freq: Two times a day (BID) | ORAL | 2 refills | Status: DC

## 2021-02-03 NOTE — Progress Notes (Signed)
Virtual Visit via Telephone Note  I connected with Sarah Rodriguez on 02/03/21 at 11:00 AM EST by telephone and verified that I am speaking with the correct person using two identifiers.  Location: Patient: Home Provider: Home Office   I discussed the limitations, risks, security and privacy concerns of performing an evaluation and management service by telephone and the availability of in person appointments. I also discussed with the patient that there may be a patient responsible charge related to this service. The patient expressed understanding and agreed to proceed.   History of Present Illness: Patient is evaluated by phone session.  She is now feeling better with the home situation.  She was not happy when find out that her brother go to father's house after he deceased and took valuable things out of the house.  She does not have a good terms with the brother.  Patient is trying to clean the house and she is relieved that tomorrow she is going to put the house on the market.  Patient told there is still a lot of things that belongs to his brother but she has no choice to put the house on the market because she contacted the lawyer to pick up the belongings.  She feels her sleep is better.  She denies any crying spells or any suicidal thoughts.  She is sleeping better.  She has one minor panic attack when she was in the car.  She admitted feeling nervous and anxious when she is in the car.  She has a good supportive husband who helps him calm her down.  We have recommended therapy but due to insurance reasons she could not afford.  Patient also had a visit with her PCP and her blood pressure medicines were adjusted.  She has normal low pulse or dizziness.  She noticed her energy level is improved.  Her appetite is okay and she denies any anhedonia.  She like to keep the current medication.  Past Psychiatric History: H/Odepression and taking antidepressants since 1996. H/Opanic attacks  while driving. SawDr. Letta Moynahan and tried Prozac, Paxil, Zoloft, Xanax, Cymbalta and Klonopin. No h/oinpatient treatment or any suicidal attempt.   Psychiatric Specialty Exam: Physical Exam  Review of Systems  Weight 120 lb (54.4 kg).There is no height or weight on file to calculate BMI.  General Appearance: NA  Eye Contact:  NA  Speech:  Clear and Coherent  Volume:  Normal  Mood:  Anxious  Affect:  NA  Thought Process:  Descriptions of Associations: Intact  Orientation:  Full (Time, Place, and Person)  Thought Content:  Rumination  Suicidal Thoughts:  No  Homicidal Thoughts:  No  Memory:  Immediate;   Good Recent;   Good Remote;   Good  Judgement:  Intact  Insight:  Present  Psychomotor Activity:  NA  Concentration:  Concentration: Fair and Attention Span: Fair  Recall:  Good  Fund of Knowledge:  Good  Language:  Good  Akathisia:  No  Handed:  Right  AIMS (if indicated):     Assets:  Communication Skills Desire for Improvement Housing Social Support  ADL's:  Intact  Cognition:  WNL  Sleep:   good      Assessment and Plan: Major depressive disorder, recurrent.  Panic attacks.  Anxiety.  Patient doing better since her father house going to be in the market tomorrow.  She still have issues with her brother but she communicated with the lawyer.  Since her blood pressure medicines were  adjusted she has no more dizziness and low pulse.  She like to keep the current medication.  She has been side effects.  Continue Lamictal 25 mg twice a day, amitriptyline 50 mg at bedtime, Cymbalta 40 mg daily and Klonopin 0.5 mg to take as needed for severe anxiety and panic attack.  Patient has no rash or any itching.  Discussed if patient continues to do better then we may consider reducing the medication.  Due to insurance and financial issues she cannot afford therapy.  Recommended to call us back if is any question or any concern.  Follow-up in 3 months.  Follow Up  Instructions:    I discussed the assessment and treatment plan with the patient. The patient was provided an opportunity to ask questions and all were answered. The patient agreed with the plan and demonstrated an understanding of the instructions.   The patient was advised to call back or seek an in-person evaluation if the symptoms worsen or if the condition fails to improve as anticipated.  I provided 20 minutes of non-face-to-face time during this encounter.   Kathlee Nations, MD

## 2021-02-05 ENCOUNTER — Other Ambulatory Visit: Payer: Self-pay | Admitting: Internal Medicine

## 2021-02-05 DIAGNOSIS — B3789 Other sites of candidiasis: Secondary | ICD-10-CM

## 2021-02-10 ENCOUNTER — Other Ambulatory Visit: Payer: Self-pay | Admitting: Internal Medicine

## 2021-02-10 DIAGNOSIS — E782 Mixed hyperlipidemia: Secondary | ICD-10-CM

## 2021-02-10 MED ORDER — ROSUVASTATIN CALCIUM 40 MG PO TABS
ORAL_TABLET | ORAL | 1 refills | Status: DC
Start: 2021-02-10 — End: 2021-02-20

## 2021-02-20 ENCOUNTER — Other Ambulatory Visit: Payer: Self-pay | Admitting: Internal Medicine

## 2021-02-20 DIAGNOSIS — Z01812 Encounter for preprocedural laboratory examination: Secondary | ICD-10-CM | POA: Diagnosis not present

## 2021-02-20 DIAGNOSIS — E782 Mixed hyperlipidemia: Secondary | ICD-10-CM

## 2021-02-20 MED ORDER — ROSUVASTATIN CALCIUM 40 MG PO TABS: ORAL_TABLET | ORAL | 3 refills | Status: DC

## 2021-02-25 DIAGNOSIS — Z86010 Personal history of colonic polyps: Secondary | ICD-10-CM | POA: Diagnosis not present

## 2021-02-25 DIAGNOSIS — K219 Gastro-esophageal reflux disease without esophagitis: Secondary | ICD-10-CM | POA: Diagnosis not present

## 2021-02-25 DIAGNOSIS — K317 Polyp of stomach and duodenum: Secondary | ICD-10-CM | POA: Diagnosis not present

## 2021-02-25 LAB — HM COLONOSCOPY

## 2021-02-27 DIAGNOSIS — K317 Polyp of stomach and duodenum: Secondary | ICD-10-CM | POA: Diagnosis not present

## 2021-03-02 DIAGNOSIS — Z01419 Encounter for gynecological examination (general) (routine) without abnormal findings: Secondary | ICD-10-CM | POA: Diagnosis not present

## 2021-03-02 DIAGNOSIS — N952 Postmenopausal atrophic vaginitis: Secondary | ICD-10-CM | POA: Diagnosis not present

## 2021-03-02 DIAGNOSIS — Z1231 Encounter for screening mammogram for malignant neoplasm of breast: Secondary | ICD-10-CM | POA: Diagnosis not present

## 2021-03-02 DIAGNOSIS — Z1151 Encounter for screening for human papillomavirus (HPV): Secondary | ICD-10-CM | POA: Diagnosis not present

## 2021-03-02 DIAGNOSIS — L900 Lichen sclerosus et atrophicus: Secondary | ICD-10-CM | POA: Diagnosis not present

## 2021-03-02 DIAGNOSIS — Z113 Encounter for screening for infections with a predominantly sexual mode of transmission: Secondary | ICD-10-CM | POA: Diagnosis not present

## 2021-03-02 DIAGNOSIS — Z6822 Body mass index (BMI) 22.0-22.9, adult: Secondary | ICD-10-CM | POA: Diagnosis not present

## 2021-03-02 DIAGNOSIS — Z01411 Encounter for gynecological examination (general) (routine) with abnormal findings: Secondary | ICD-10-CM | POA: Diagnosis not present

## 2021-03-02 DIAGNOSIS — Z124 Encounter for screening for malignant neoplasm of cervix: Secondary | ICD-10-CM | POA: Diagnosis not present

## 2021-03-02 LAB — HM MAMMOGRAPHY

## 2021-03-11 ENCOUNTER — Encounter: Payer: Self-pay | Admitting: Internal Medicine

## 2021-03-17 ENCOUNTER — Other Ambulatory Visit: Payer: Self-pay | Admitting: Internal Medicine

## 2021-03-20 ENCOUNTER — Ambulatory Visit (INDEPENDENT_AMBULATORY_CARE_PROVIDER_SITE_OTHER): Payer: PPO | Admitting: Adult Health

## 2021-03-20 ENCOUNTER — Other Ambulatory Visit: Payer: Self-pay

## 2021-03-20 ENCOUNTER — Encounter: Payer: Self-pay | Admitting: Adult Health

## 2021-03-20 VITALS — BP 132/84 | HR 87 | Temp 96.6°F | Wt 117.0 lb

## 2021-03-20 DIAGNOSIS — H65111 Acute and subacute allergic otitis media (mucoid) (sanguinous) (serous), right ear: Secondary | ICD-10-CM | POA: Diagnosis not present

## 2021-03-20 DIAGNOSIS — Z1152 Encounter for screening for COVID-19: Secondary | ICD-10-CM | POA: Diagnosis not present

## 2021-03-20 DIAGNOSIS — J301 Allergic rhinitis due to pollen: Secondary | ICD-10-CM

## 2021-03-20 LAB — POC COVID19 BINAXNOW: SARS Coronavirus 2 Ag: NEGATIVE

## 2021-03-20 MED ORDER — CETIRIZINE HCL 10 MG PO TABS: 10.0000 mg | ORAL_TABLET | Freq: Every day | ORAL | 2 refills | Status: DC

## 2021-03-20 MED ORDER — DEXAMETHASONE 0.5 MG PO TABS: ORAL_TABLET | ORAL | 0 refills | Status: DC

## 2021-03-20 MED ORDER — FLUTICASONE PROPIONATE 50 MCG/ACT NA SUSP: NASAL | 3 refills | Status: DC

## 2021-03-20 MED ORDER — PROMETHAZINE-DM 6.25-15 MG/5ML PO SYRP: 5.0000 mL | ORAL_SOLUTION | Freq: Four times a day (QID) | ORAL | 1 refills | Status: DC | PRN

## 2021-03-20 NOTE — Patient Instructions (Signed)

## 2021-03-20 NOTE — Progress Notes (Signed)
Assessment and Plan:  Sarah Rodriguez was seen today for acute visit.  Diagnoses and all orders for this visit:  Non-seasonal allergic rhinitis due to pollen Acute allergic serous otitis media of right ear - Rotate allergy pill, allergy hygiene explained, use mask to minimize exposure to trigger when possible, saline irrigations following contact - increase flonase to BID short term - short steroid taper, stop once R ear pressure improved then continue flonase only - OTC decongestants; can do short term sudafed if tolerates - Follow up if persistent or new fever/chills progressive ear pain or sinus pain -     dexamethasone (DECADRON) 0.5 MG tablet; take 1 tablet PO BID for 3 days, then take 1 tablet PO for 4 days. -     fluticasone (FLONASE) 50 MCG/ACT nasal spray; INSTILL 2 SPRAYS IN EACH NOSTRIL EVERY EVENING -     cetirizine (ZYRTEC ALLERGY) 10 MG tablet; Take 1 tablet (10 mg total) by mouth daily.  Encounter for screening for COVID-19 -     POC COVID-19  Other orders -     promethazine-dextromethorphan (PROMETHAZINE-DM) 6.25-15 MG/5ML syrup; Take 5 mLs by mouth 4 (four) times daily as needed for cough.  Further disposition pending results of labs. Discussed med's effects and SE's.   Over 15 minutes of exam, counseling, chart review, and critical decision making was performed.   Future Appointments  Date Time Provider [place]  04/16/2021 10:30 AM Unk Pinto, MD [facility] None  05/05/2021 11:00 AM Arfeen, Arlyce Harman, MD [facility] None  09/30/2021  3:00 PM Unk Pinto, MD [facility] None  01/06/2022 11:00 AM Liane Comber, NP [facility] None    ------------------------------------------------------------------------------------------------------------------   HPI BP 132/84   Pulse 87   Temp (!) 96.6 F (35.9 C)   Wt 117 lb (53.1 kg)   SpO2 97%   BMI 21.75 kg/m   64 y.o.female remote former smoker with hx of allergies and asthma (well controlled on inhalers) presents  for evaluation of URI sx. She had negative rapid covid 19 today prior to evaluation.   She reports has been cleaning out [place], lots of dust, reports has been having nasal congestion, ear pressure/pain/pressure, worse on R, ringing in ears, worse in last 7-10 days. ENT saw her 2 days ago, wasn't concerned, got new hearing aid. Denies HA, sinus tenderness, fever/chills, dizziness, sore throat. Does have new non-productive cough from through. Denies wheezing, dyspnea, CP.   She has been taking allegra-D 12 hour in AM - has done this for many years  Also saline nasal irrigations/netti pot but only after working in dusty area Also does flonase sprays in each nostril, regularly for the last 7-10 days without significant benefit  Added robitussin DM without benefit.   Also hx of TMJ, has been using her TMJ mouth guard.   Past Medical History:  Diagnosis Date  . Asthma    Dulera daily  . Chronic back pain    HNP  . Complication of anesthesia    slow to wake up  . Depression    takes Clonazepam nightly  . GERD (gastroesophageal reflux disease)    takes Protonix daily   . Heart murmur   . History of migraine    takes Relpax daily as needed  . HSV (herpes simplex virus) infection   . Hyperlipidemia    takes Atorvastatin daily  . Hypertension    takes Benicar daily  . Hypothyroidism    takes Synthroid daily  . MVP (mitral valve prolapse)   .  Pneumonia    hx of--been many yrs ago  . Seasonal allergies    takes Allegra daily  . Shortness of breath    with exertion     Allergies  Allergen Reactions  . Demerol [Meperidine] Other (See Comments)    REACTION: unknown--itching  . Epinephrine Other (See Comments)    "Sensitivity."  . Niacin And Related Rash    Current Outpatient Medications on File Prior to Visit  Medication Sig  . albuterol (PROAIR HFA) 108 (90 Base) MCG/ACT inhaler Inhale 2 puffs into the lungs every 6 (six) hours as needed for wheezing or shortness of breath.   Marland Kitchen amitriptyline (ELAVIL) 25 MG tablet Take 1 tablet (25 mg total) by mouth at bedtime.  . Cholecalciferol (VITAMIN D PO) Take 4,000 Units by mouth daily.   Marland Kitchen CINNAMON PO Take 1,000 Units by mouth 2 (two) times daily.   . clonazePAM (KLONOPIN) 0.5 MG tablet Take 1/2 to 1 tablet as needed for severe anxiety.  . diclofenac sodium (VOLTAREN) 1 % GEL Apply 4 g topically 4 (four) times daily.  . DULoxetine HCl 40 MG CPEP Take 40 mg by mouth daily.  . fexofenadine-pseudoephedrine (ALLEGRA-D) 60-120 MG 12 hr tablet Take 1 tablet by mouth daily.  . fluticasone (FLONASE) 50 MCG/ACT nasal spray INSTILL 2 SPRAYS IN EACH NOSTRIL EVERY EVENING  . fluticasone-salmeterol (ADVAIR HFA) 115-21 MCG/ACT inhaler INHALE 2 PUFFS BY MOUTH EVERY 12 HOURS 10 TO 15 MINUTES APART. Rinse mouth and gargle after using to avoid thrush.  . halobetasol (ULTRAVATE) 0.05 % ointment   . lamoTRIgine (LAMICTAL) 25 MG tablet Take 1 tablet (25 mg total) by mouth 2 (two) times daily.  Marland Kitchen levothyroxine (SYNTHROID) 50 MCG tablet Take 1/2 tablet  Daily  on an empty stomach with only water for 30 minutes & no Antacid meds, Calcium or Magnesium for 4 hours & avoid Biotin  . losartan (COZAAR) 50 MG tablet TAKE 1 TABLET BY MOUTH DAILY FOR BLOOD PRESSURE  . Magnesium 250 MG TABS Take 3 tablets by mouth daily.   . meloxicam (MOBIC) 15 MG tablet Take  1 tablet  Daily  with Food  for Pain & Inflammation  . montelukast (SINGULAIR) 10 MG tablet Take  1 tablet  Daily  for Allergies  . nystatin (MYCOSTATIN) 100000 UNIT/ML suspension SWISH AND SWALLOW 5 ML BY MOUTH FOUR TIMES DAILY FOR THRUSH OR YEAST INFECTION  . ondansetron (ZOFRAN) 4 MG tablet Take 1 tablet (4 mg total) by mouth every 8 (eight) hours as needed.  . pantoprazole (PROTONIX) 40 MG tablet Take     1 tablet      Daily        to Prevent Heartburn & Indigestion  . pregabalin (LYRICA) 50 MG capsule Take 50 mg by mouth at bedtime.  Marland Kitchen PREMARIN vaginal cream INSERT 1/2 GRAM VAGINALLY VIA  APPLICATOR NIGHTLY FOR W EEKS THEN JUST DO TEICE A WEEK  . rosuvastatin (CRESTOR) 40 MG tablet Take  1 tablet  2 x /week for Cholesterol  . Spacer/Aero-Holding Chambers (AEROCHAMBER MV) inhaler Use as instructed with inhaler.  . Rimegepant Sulfate (NURTEC) 75 MG TBDP Take 75 mg by mouth as needed (migraine). (Patient not taking: Reported on 03/20/2021)   No current facility-administered medications on file prior to visit.    ROS: all negative except above.   Physical Exam:  BP 132/84   Pulse 87   Temp (!) 96.6 F (35.9 C)   Wt 117 lb (53.1 kg)   SpO2 97%  BMI 21.75 kg/m   General Appearance: Well nourished, in no apparent distress. Eyes: PERRLA, EOMs, conjunctiva no swelling or erythema Sinuses: No Frontal/maxillary tenderness ENT/Mouth: Ext aud canals clear, TMs without erythema, R appears mildly distended. No erythema, swelling, or exudate on post pharynx.  Tonsils not swollen or erythematous. Hearing normal.  Neck: Supple. Mild tenderness R anterior upper chain without palpable lymph node.  Respiratory: Respiratory effort normal, BS equal bilaterally without rales, rhonchi, wheezing or stridor.  Cardio: RRR with no MRGs. Brisk peripheral pulses without edema.  Abdomen: Soft, + BS.  Non tender. Lymphatics: Non tender without lymphadenopathy.  Musculoskeletal: Full ROM, 5/5 strength, normal gait.  Skin: Warm, dry without rashes, lesions, ecchymosis.  Neuro: Cranial nerves intact. Normal muscle tone Psych: Awake and oriented X 3, normal affect, Insight and Judgment appropriate.     Izora Ribas, NP 11:49 AM [facility]

## 2021-04-02 ENCOUNTER — Encounter: Payer: Self-pay | Admitting: Internal Medicine

## 2021-04-15 NOTE — Progress Notes (Signed)
Future Appointments  Date Time Provider [place]  04/16/2021 10:30 AM Unk Pinto, MD [facility] None  05/05/2021 10:40 AM Arfeen, Arlyce Harman, MD [facility] None  09/30/2021  3:00 PM Unk Pinto, MD [facility] None  01/06/2022 11:00 AM Liane Comber, NP [facility] None    History of Present Illness:      This very nice 65 y.o. MWF presents for 6 month follow up with HTN, HLD, Hypothyroidism,  Pre-Diabetes and Vitamin D Deficiency.  Patient has GERD controlled on her Pantoprazole. She also has a Major Depressive Disorder & panic Attacks & is followed by Dr Berniece Andreas.        Patient is on SS Disabilitysince [address]216 for aChronic Pain Syndromeconsewquent  to Cx DDD/HNP(Cx surgeryin 2011 and a Lumbar surgery  L5/S1 in 2013 at by Dr Ellene Route and3rd surgery- LumbarFusion in Nov 2016).She reports chronic intermittent RLE pain.       Patient is treated for HTN  (2007) & BP has been controlled at home. Today's BP was initially elevated & rechecked at goal - 136/76. Patient has had no complaints of any cardiac type chest pain, palpitations, dyspnea / orthopnea / PND, dizziness, claudication, or dependent edema.       Hyperlipidemia is controlled with diet & Crestor.   Patient denies myalgias or other med SE's. Last Lipids were at goal albeit slightly slightly elevated Trig's:  Lab Results  Component Value Date   CHOL 141 01/06/2021   HDL 39 (L) 01/06/2021   LDLCALC 75 01/06/2021   TRIG 174 (H) 01/06/2021   CHOLHDL 3.6 01/06/2021    Also, the patient has history of PreDiabetes (A1c 5.9%/2009&6.3% /2014) and has had no symptoms of reactive hypoglycemia, diabetic polys, paresthesias or visual blurring.  Last A1c was near goal:  Lab Results  Component Value Date   HGBA1C 5.7 (H) 04/16/2021                                         Patient was  dx'd Hypothyroid in 2009 & initiated on thyroid replacement.       Further, the patient also has history of  Vitamin D Deficiency ("9" /2009) and supplements vitamin D without any suspected side-effects. Last vitamin D was at goal:  Lab Results  Component Value Date   VD25OH 89 09/24/2020     Current Outpatient Medications on File Prior to Visit  Medication Sig  . albuterol (PROAIR HFA) 108 (90 Base) MCG/ACT inhaler Inhale 2 puffs into the lungs every 6 (six) hours as needed for wheezing or shortness of breath.  Marland Kitchen amitriptyline 25 MG tablet Take 1 tablet  at bedtime.  . cetirizine  10 MG tablet Take 1 tablet daily.  Marland Kitchen VITAMIN D  4,000 Units Take daily.   Marland Kitchen CINNAMON 1,000 mg Take  2 times daily.   . clonazePAM ( 0.5 MG tablet Take 1/2 to 1 tablet as needed   . diclofenac  1 % GEL Apply 4 g topically 4  times daily.  . DULoxetine  40 MG  Take  daily.  Marland Kitchen fFLONASE nasal spray 2 SPRAYS IN EACH NOSTRIL EVERY EVENING  . ADVAIR HFA 115-21  inhaler INHALE 2 PUFFS   EVERY 12 HRS  . ULTRAVATE 0.05 % oint   . lamoTRIgine  25 MG tablet Take 1 tablet (25 mg total) by mouth 2 (two) times daily.  Marland Kitchen levothyroxine 50 MCG tablet  Take 1/2 tablet  Daily   . losartan \ 50 MG tablet TAKE 1 TABLET \\DAILYFOR  BLOOD PRESSURE  . Magnesium 250 MG TABS Take 3 tablets by mouth daily.   . meloxicam (MOBIC) 15 MG tablet Take  1 tablet  Daily    . montelukast  10 MG tablet Take  1 tablet  Daily    . MYCOSTATIN suspension SWISH AND SWALLOW 5 ML 4 x/ DAILY   . ondansetron  4 MG tablet Take 1 tablet  every 8 hours as needed.  . pantoprazole 40 MG tablet Take  1 tablet Daily   . pregabalin50 MG capsule Take  at bedtime.  Marland Kitchen PREMARIN vaginal cream INSERTVAGINALLY TWICE A WEEK  . NURTEC 75 MG TBDP Take 75 mg by mouth as needed (migraine).  . rosuvastatin  40 MG tablet Take  1 tablet  2 x /week for Cholesterol    Allergies  Allergen Reactions  . Demerol [Meperidine] Other (See Comments)    REACTION: unknown--itching  . Epinephrine Other (See Comments)    "Sensitivity."  . Niacin And Related Rash    PMHx:   Past Medical  History:  Diagnosis Date  . Asthma    Dulera daily  . Chronic back pain    HNP  . Complication of anesthesia    slow to wake up  . Depression    takes Clonazepam nightly  . GERD (gastroesophageal reflux disease)    takes Protonix daily   . Heart murmur   . History of migraine    takes Relpax daily as needed  . HSV (herpes simplex virus) infection   . Hyperlipidemia    takes Atorvastatin daily  . Hypertension    takes Benicar daily  . Hypothyroidism    takes Synthroid daily  . MVP (mitral valve prolapse)   . Pneumonia    hx of--been many yrs ago  . Seasonal allergies    takes Allegra daily  . Shortness of breath    with exertion    Immunization History  Administered Date(s) Administered  . Influenza Inj Mdck Quad Pf 08/27/2019  . Influenza Inj Mdck Quad  09/14/2017, 10/19/2018, 09/24/2020  . Influenza,inj,quad,  11/09/2016  . Influenza- 09/18/2013  . Moderna Sars-Covid-2 Vacc 02/15/2020, 03/18/2020, 11/10/2020  . PPD Test 04/17/2014  . Pneumococcal-23 11/30/1995  . Td 08/27/2005, 05/05/2016     Past Surgical History:  Procedure Laterality Date  . ABLATION  2011   Uterine  . buttocks surgery     at age 82 (coccyx repair)  . CERVICAL FUSION  03/2010  . CESAREAN SECTION  1992  . CHOLECYSTECTOMY  2008  . COLONOSCOPY    . DILATION AND CURETTAGE OF UTERUS  2011  . ESOPHAGOGASTRODUODENOSCOPY    . LUMBAR LAMINECTOMY/DECOMPRESSION MICRODISCECTOMY  10/24/2012   Procedure: LUMBAR LAMINECTOMY/DECOMPRESSION MICRODISCECTOMY 1 LEVEL;  Surgeon: Kristeen Miss, MD;  Location: [place] [facility];  Service: Neurosurgery;  Laterality: Right;  Right Lumbar five-Sacral One Microdiskectomy  . right knee arthroscopy     x 3  . TONSILLECTOMY     at age 66    FHx:    Reviewed / unchanged  SHx:    Reviewed / unchanged   Systems Review:  Constitutional: Denies fever, chills, wt changes, headaches, insomnia, fatigue, night sweats, change in appetite. Eyes: Denies redness, blurred  vision, diplopia, discharge, itchy, watery eyes.  ENT: Denies discharge, congestion, post nasal drip, epistaxis, sore throat, earache, hearing loss, dental pain, tinnitus, vertigo, sinus pain, snoring.  CV: Denies chest pain,  palpitations, irregular heartbeat, syncope, dyspnea, diaphoresis, orthopnea, PND, claudication or edema. Respiratory: denies cough, dyspnea, DOE, pleurisy, hoarseness, laryngitis, wheezing.  Gastrointestinal: Denies dysphagia, odynophagia, heartburn, reflux, water brash, abdominal pain or cramps, nausea, vomiting, bloating, diarrhea, constipation, hematemesis, melena, hematochezia  or hemorrhoids. Genitourinary: Denies dysuria, frequency, urgency, nocturia, hesitancy, discharge, hematuria or flank pain. Musculoskeletal: Denies arthralgias, myalgias, stiffness, jt. swelling, pain, limping or strain/sprain.  Skin: Denies pruritus, rash, hives, warts, acne, eczema or change in skin lesion(s). Neuro: No weakness, tremor, incoordination, spasms, paresthesia or pain. Psychiatric: Denies confusion, memory loss or sensory loss. Endo: Denies change in weight, skin or hair change.  Heme/Lymph: No excessive bleeding, bruising or enlarged lymph nodes.  Physical Exam  BP 136/76   Pulse 76   Temp 97.7 F (36.5 C)   Resp 16   Ht 5' 1.5" (1.562 m)   Wt 117 lb 3.2 oz (53.2 kg)   SpO2 99%   BMI 21.79 kg/m   Appears  well nourished, well groomed  and in no distress.  Eyes: PERRLA, EOMs, conjunctiva no swelling or erythema. Sinuses: No frontal/maxillary tenderness ENT/Mouth: EAC's clear, TM's nl w/o erythema, bulging. Nares clear w/o erythema, swelling, exudates. Oropharynx clear without erythema or exudates. Oral hygiene is good. Tongue normal, non obstructing. Hearing intact.  Neck: Supple. Thyroid not palpable. Car 2+/2+ without bruits, nodes or JVD. Chest: Respirations nl with BS clear & equal w/o rales, rhonchi, wheezing or stridor.  Cor: Heart sounds normal w/ regular rate  and rhythm without sig. murmurs, gallops, clicks or rubs. Peripheral pulses normal and equal  without edema.  Abdomen: Soft & bowel sounds normal. Non-tender w/o guarding, rebound, hernias, masses or organomegaly.  Lymphatics: Unremarkable.  Musculoskeletal: Full ROM all peripheral extremities, joint stability, 5/5 strength and normal gait.  Skin: Warm, dry without exposed rashes, lesions or ecchymosis apparent.  Neuro: Cranial nerves intact, reflexes equal bilaterally. Sensory-motor testing grossly intact. Tendon reflexes grossly intact.  Pysch: Alert & oriented x 3.  Insight and judgement nl & appropriate. No ideations.  Assessment and Plan:  1. Essential hypertension  - Continue medication, monitor blood pressure at home.  - Continue DASH diet.  Reminder to go to the ER if any CP,  SOB, nausea, dizziness, severe HA, changes vision/speech.  - CBC with Differential/Platelet - COMPLETE METABOLIC PANEL WITH GFR - Magnesium - TSH  2. Hyperlipidemia, mixed  - Continue diet/meds, exercise,& lifestyle modifications.  - Continue monitor periodic cholesterol/liver & renal functions   - Lipid panel - TSH  3. Abnormal glucose  - Hemoglobin A1c - Insulin, random  4. Vitamin D deficiency  - Continue diet, exercise  - Lifestyle modifications.  - Monitor appropriate labs. - Continue supplementation.  - VITAMIN D 25 Hydroxy  5. Hypothyroidism  - TSH  6. Gastroesophageal reflux disease  - CBC with Differential/Platelet  7. Medication management  - CBC with Differential/Platelet - COMPLETE METABOLIC PANEL WITH GFR - Magnesium - Lipid panel - TSH - Hemoglobin A1c - Insulin, random - VITAMIN D 25 Hydroxy  8. Bilateral chronic serous otitis media  - dexamethasone 4 MG tab; Take 1 tab 3 x day - 3 days, then 2 x day - 3 days, then 1 tab daily  Disp: 20 tablet  - Pseudoephedrine HCl 240 MG TB24; Take 1 tablet every Morning   Disp: 90 tablet; Refill: 3         Discussed   regular exercise, BP monitoring, weight control to achieve/maintain BMI less than 25 and discussed med and  SE's. Recommended labs to assess and monitor clinical status with further disposition pending results of labs.  I discussed the assessment and treatment plan with the patient. The patient was provided an opportunity to ask questions and all were answered. The patient agreed with the plan and demonstrated an understanding of the instructions.  I provided over 30 minutes of exam, counseling, chart review and  complex critical decision making.        The patient was advised to call back or seek an in-person evaluation if the symptoms worsen or if the condition fails to improve as anticipated.   Kirtland Bouchard, MD

## 2021-04-15 NOTE — Patient Instructions (Signed)

## 2021-04-16 ENCOUNTER — Other Ambulatory Visit: Payer: Self-pay

## 2021-04-16 ENCOUNTER — Ambulatory Visit (INDEPENDENT_AMBULATORY_CARE_PROVIDER_SITE_OTHER): Payer: PPO | Admitting: Internal Medicine

## 2021-04-16 VITALS — BP 136/76 | HR 76 | Temp 97.7°F | Resp 16 | Ht 61.5 in | Wt 117.2 lb

## 2021-04-16 DIAGNOSIS — K219 Gastro-esophageal reflux disease without esophagitis: Secondary | ICD-10-CM | POA: Diagnosis not present

## 2021-04-16 DIAGNOSIS — I10 Essential (primary) hypertension: Secondary | ICD-10-CM | POA: Diagnosis not present

## 2021-04-16 DIAGNOSIS — E782 Mixed hyperlipidemia: Secondary | ICD-10-CM

## 2021-04-16 DIAGNOSIS — Z79899 Other long term (current) drug therapy: Secondary | ICD-10-CM | POA: Diagnosis not present

## 2021-04-16 DIAGNOSIS — H6523 Chronic serous otitis media, bilateral: Secondary | ICD-10-CM

## 2021-04-16 DIAGNOSIS — E559 Vitamin D deficiency, unspecified: Secondary | ICD-10-CM | POA: Diagnosis not present

## 2021-04-16 DIAGNOSIS — R7309 Other abnormal glucose: Secondary | ICD-10-CM

## 2021-04-16 DIAGNOSIS — E039 Hypothyroidism, unspecified: Secondary | ICD-10-CM

## 2021-04-16 MED ORDER — DEXAMETHASONE 4 MG PO TABS: ORAL_TABLET | ORAL | 0 refills | Status: DC

## 2021-04-16 MED ORDER — PSEUDOEPHEDRINE HCL ER 240 MG PO TB24: ORAL_TABLET | ORAL | 3 refills | Status: DC

## 2021-04-17 LAB — CBC WITH DIFFERENTIAL/PLATELET
Absolute Monocytes: 384 cells/uL (ref 200–950)
Basophils Absolute: 19 cells/uL (ref 0–200)
Basophils Relative: 0.3 %
Eosinophils Absolute: 242 cells/uL (ref 15–500)
Eosinophils Relative: 3.9 %
HCT: 42.5 % (ref 35.0–45.0)
Hemoglobin: 14.2 g/dL (ref 11.7–15.5)
Lymphs Abs: 1854 cells/uL (ref 850–3900)
MCH: 31.6 pg (ref 27.0–33.0)
MCHC: 33.4 g/dL (ref 32.0–36.0)
MCV: 94.7 fL (ref 80.0–100.0)
MPV: 10.1 fL (ref 7.5–12.5)
Monocytes Relative: 6.2 %
Neutro Abs: 3701 cells/uL (ref 1500–7800)
Neutrophils Relative %: 59.7 %
Platelets: 249 10*3/uL (ref 140–400)
RBC: 4.49 10*6/uL (ref 3.80–5.10)
RDW: 11.4 % (ref 11.0–15.0)
Total Lymphocyte: 29.9 %
WBC: 6.2 10*3/uL (ref 3.8–10.8)

## 2021-04-17 LAB — LIPID PANEL
Cholesterol: 182 mg/dL (ref <200)
HDL: 43 mg/dL — ABNORMAL LOW (ref >=50)
LDL Cholesterol (Calc): 112 mg/dL (calc) — ABNORMAL HIGH
Non-HDL Cholesterol (Calc): 139 mg/dL (calc) — ABNORMAL HIGH (ref <130)
Total CHOL/HDL Ratio: 4.2 (calc) (ref <5.0)
Triglycerides: 157 mg/dL — ABNORMAL HIGH (ref <150)

## 2021-04-17 LAB — COMPLETE METABOLIC PANEL WITH GFR
AG Ratio: 2.3 (calc) (ref 1.0–2.5)
ALT: 19 U/L (ref 6–29)
AST: 19 U/L (ref 10–35)
Albumin: 4.5 g/dL (ref 3.6–5.1)
Alkaline phosphatase (APISO): 66 U/L (ref 37–153)
BUN: 15 mg/dL (ref 7–25)
CO2: 27 mmol/L (ref 20–32)
Calcium: 9.7 mg/dL (ref 8.6–10.4)
Chloride: 103 mmol/L (ref 98–110)
Creat: 0.91 mg/dL (ref 0.50–0.99)
GFR, Est African American: 77 mL/min/{1.73_m2} (ref >=60)
GFR, Est Non African American: 67 mL/min/{1.73_m2} (ref >=60)
Globulin: 2 g/dL (calc) (ref 1.9–3.7)
Glucose, Bld: 173 mg/dL — ABNORMAL HIGH (ref 65–99)
Potassium: 3.9 mmol/L (ref 3.5–5.3)
Sodium: 141 mmol/L (ref 135–146)
Total Bilirubin: 0.4 mg/dL (ref 0.2–1.2)
Total Protein: 6.5 g/dL (ref 6.1–8.1)

## 2021-04-17 LAB — HEMOGLOBIN A1C
Hgb A1c MFr Bld: 5.7 % of total Hgb — ABNORMAL HIGH (ref <5.7)
Mean Plasma Glucose: 117 mg/dL
eAG (mmol/L): 6.5 mmol/L

## 2021-04-17 LAB — MAGNESIUM: Magnesium: 1.9 mg/dL (ref 1.5–2.5)

## 2021-04-17 LAB — TSH: TSH: 2.09 mIU/L (ref 0.40–4.50)

## 2021-04-17 LAB — VITAMIN D 25 HYDROXY (VIT D DEFICIENCY, FRACTURES): Vit D, 25-Hydroxy: 94 ng/mL (ref 30–100)

## 2021-04-17 LAB — INSULIN, RANDOM: Insulin: 82.5 u[IU]/mL — ABNORMAL HIGH

## 2021-04-18 NOTE — Progress Notes (Signed)
============================================================ -   Test results slightly outside the reference range are not unusual. If there is anything important, I will review this with you,  otherwise it is considered normal test values.  If you have further questions,  please do not hesitate to contact me at the office or via My Chart.  ============================================================ ============================================================  -  Glucose elevated at 173 mg % , but probably since A1c only 5.8% which is   a 12 week average blood sugar of 117 mg% - But still need to watch diet    - Avoid Sweets, Candy & White Stuff   - White Rice, White Potatoes, White Flour  - Breads &  Pasta ============================================================ ============================================================  -  But Insulin = 82.5 - elevated             ( Ideal or goal is less than 20  )   - So elevated shows insulin resistance                - a sign of early diabetes and associated with                       a 300 % greater risk for heart attacks, strokes, cancer &                                                                     Alzheimer type Vascular Dementia   - All this can be cured  and prevented with losing weight   - get Dr Fara Olden Fuhrman's book 'the End of Diabetes" and "the End of Dieting" &  add many years of good health to your life. ============================================================ ============================================================  -  Vitamin D = 94 - Excellent   ! ============================================================ ============================================================  -  All Else - CBC - Kidneys - Electrolytes - Liver - Magnesium & Thyroid    - all  Normal / OK ============================================================ ============================================================  -  Keep up the  great Work  ! ============================================================ ============================================================

## 2021-04-19 ENCOUNTER — Encounter: Payer: Self-pay | Admitting: Internal Medicine

## 2021-04-22 ENCOUNTER — Encounter: Payer: Self-pay | Admitting: *Deleted

## 2021-04-23 ENCOUNTER — Other Ambulatory Visit (HOSPITAL_COMMUNITY): Payer: Self-pay | Admitting: Psychiatry

## 2021-04-23 DIAGNOSIS — F410 Panic disorder [episodic paroxysmal anxiety] without agoraphobia: Secondary | ICD-10-CM

## 2021-04-23 DIAGNOSIS — F331 Major depressive disorder, recurrent, moderate: Secondary | ICD-10-CM

## 2021-04-23 DIAGNOSIS — F411 Generalized anxiety disorder: Secondary | ICD-10-CM

## 2021-04-30 ENCOUNTER — Telehealth (HOSPITAL_COMMUNITY): Payer: Self-pay | Admitting: *Deleted

## 2021-04-30 DIAGNOSIS — F410 Panic disorder [episodic paroxysmal anxiety] without agoraphobia: Secondary | ICD-10-CM

## 2021-04-30 DIAGNOSIS — F331 Major depressive disorder, recurrent, moderate: Secondary | ICD-10-CM

## 2021-04-30 DIAGNOSIS — F411 Generalized anxiety disorder: Secondary | ICD-10-CM

## 2021-04-30 NOTE — Telephone Encounter (Signed)
Pt called requesting a refill of the Klonopin 0.5 mg. Pt states that she's going out of town and would feel more comfortable if she has the Klonopin if needed. Last written 02/03/21 for #20. Pt has an upcoming appointment on 05/05/21. Please review and advise.

## 2021-05-01 MED ORDER — CLONAZEPAM 0.5 MG PO TABS: ORAL_TABLET | ORAL | 0 refills | Status: DC

## 2021-05-01 NOTE — Telephone Encounter (Signed)
Done

## 2021-05-05 ENCOUNTER — Telehealth (HOSPITAL_COMMUNITY): Payer: PPO | Admitting: Psychiatry

## 2021-05-05 ENCOUNTER — Other Ambulatory Visit: Payer: Self-pay

## 2021-05-05 ENCOUNTER — Encounter (HOSPITAL_COMMUNITY): Payer: Self-pay | Admitting: Psychiatry

## 2021-05-05 ENCOUNTER — Telehealth (INDEPENDENT_AMBULATORY_CARE_PROVIDER_SITE_OTHER): Payer: PPO | Admitting: Psychiatry

## 2021-05-05 VITALS — Wt 117.0 lb

## 2021-05-05 DIAGNOSIS — F410 Panic disorder [episodic paroxysmal anxiety] without agoraphobia: Secondary | ICD-10-CM

## 2021-05-05 DIAGNOSIS — F331 Major depressive disorder, recurrent, moderate: Secondary | ICD-10-CM

## 2021-05-05 DIAGNOSIS — F411 Generalized anxiety disorder: Secondary | ICD-10-CM

## 2021-05-05 MED ORDER — DULOXETINE HCL 60 MG PO CPEP: 60.0000 mg | ORAL_CAPSULE | Freq: Every day | ORAL | 0 refills | Status: DC

## 2021-05-05 NOTE — Progress Notes (Signed)
Virtual Visit via Telephone Note  I connected with Sarah Rodriguez on 05/05/21 at 10:40 AM EDT by telephone and verified that I am speaking with the correct person using two identifiers.  Location: Patient: Home Provider: Home Office   I discussed the limitations, risks, security and privacy concerns of performing an evaluation and management service by telephone and the availability of in person appointments. I also discussed with the patient that there may be a patient responsible charge related to this service. The patient expressed understanding and agreed to proceed.   History of Present Illness: Patient is evaluated by phone session.  She admitted lately feeling more lonely, sad and sometimes having crying spells.  She feels everyone have left her and no one communicates and talk to her.  She is not sure what happened to her friends as they are not around.  One of her best friend who used to come every week is now started relationship and she is spending more time with the boyfriend.  Her mother does not talk to her.  Her daughter who lives in [place] came on Mother's Day but since then she has no contact with her.  Patient admitted that people are busy in their life and she feels very lonely.  She denies any suicidal thoughts but admitted sometime ruminative thoughts and misses her father a lot.  She had sold the house of her father and trying to sell the leftover belongings and that makes her more crying.  She feels at this age have difficulty to make new friends.  She tried to work in the yard but told her muscle and given steroids and she is not happy because she is eating more than normal.  She also have a hearing problem and sometimes not comfortable talking to the people.  She sleeps at least 7 hours.  She admitted getting easily sensitive and emotional and easy to cry.  She denies any hallucination, paranoia, homicidal thoughts or suicidal thoughts.  Her appetite is okay.  Her weight  is stable.  Past Psychiatric History: H/Odepression and taking antidepressants since 1996. H/Opanic attacks while driving. SawDr. Letta Moynahan and tried Prozac, Paxil, Zoloft, Xanax, Cymbalta and Klonopin. No h/oinpatient treatment or any suicidal attempt.   Recent Results (from the past 2160 hour(s))  HM COLONOSCOPY     Status: None   Collection Time: 02/25/21 12:00 AM  Result Value Ref Range   HM Colonoscopy See Report (in chart) See Report (in chart), Patient Reported    Comment: Dr Cristina Gong  HM MAMMOGRAPHY     Status: None   Collection Time: 03/02/21 12:00 AM  Result Value Ref Range   HM Mammogram 0-4 Bi-Rad 0-4 Bi-Rad, Self Reported Normal  POC COVID-19     Status: Normal   Collection Time: 03/20/21 12:11 PM  Result Value Ref Range   SARS Coronavirus 2 Ag Negative Negative  CBC with Differential/Platelet     Status: None   Collection Time: 04/16/21 10:31 AM  Result Value Ref Range   WBC 6.2 3.8 - 10.8 Thousand/uL   RBC 4.49 3.80 - 5.10 Million/uL   Hemoglobin 14.2 11.7 - 15.5 g/dL   HCT 42.5 35.0 - 45.0 %   MCV 94.7 80.0 - 100.0 fL   MCH 31.6 27.0 - 33.0 pg   MCHC 33.4 32.0 - 36.0 g/dL   RDW 11.4 11.0 - 15.0 %   Platelets 249 140 - 400 Thousand/uL   MPV 10.1 7.5 - 12.5 fL   Neutro  Abs 3,701 1,500 - 7,800 cells/uL   Lymphs Abs 1,854 850 - 3,900 cells/uL   Absolute Monocytes 384 200 - 950 cells/uL   Eosinophils Absolute 242 15 - 500 cells/uL   Basophils Absolute 19 0 - 200 cells/uL   Neutrophils Relative % 59.7 %   Total Lymphocyte 29.9 %   Monocytes Relative 6.2 %   Eosinophils Relative 3.9 %   Basophils Relative 0.3 %  COMPLETE METABOLIC PANEL WITH GFR     Status: Abnormal   Collection Time: 04/16/21 10:31 AM  Result Value Ref Range   Glucose, Bld 173 (H) 65 - 99 mg/dL    Comment: .            Fasting reference interval . For someone without known diabetes, a glucose value >125 mg/dL indicates that they may have diabetes and this should be confirmed  with a follow-up test. .    BUN 15 7 - 25 mg/dL   Creat 0.91 0.50 - 0.99 mg/dL    Comment: For patients >82 years of age, the reference limit for Creatinine is approximately 13% higher for people identified as African-American. .    GFR, Est Non African American 67 > OR = 60 mL/min/1.84m2   GFR, Est African American 77 > OR = 60 mL/min/1.80m2   BUN/Creatinine Ratio NOT APPLICABLE 6 - 22 (calc)   Sodium 141 135 - 146 mmol/L   Potassium 3.9 3.5 - 5.3 mmol/L   Chloride 103 98 - 110 mmol/L   CO2 27 20 - 32 mmol/L   Calcium 9.7 8.6 - 10.4 mg/dL   Total Protein 6.5 6.1 - 8.1 g/dL   Albumin 4.5 3.6 - 5.1 g/dL   Globulin 2.0 1.9 - 3.7 g/dL (calc)   AG Ratio 2.3 1.0 - 2.5 (calc)   Total Bilirubin 0.4 0.2 - 1.2 mg/dL   Alkaline phosphatase (APISO) 66 37 - 153 U/L   AST 19 10 - 35 U/L   ALT 19 6 - 29 U/L  Magnesium     Status: None   Collection Time: 04/16/21 10:31 AM  Result Value Ref Range   Magnesium 1.9 1.5 - 2.5 mg/dL  Lipid panel     Status: Abnormal   Collection Time: 04/16/21 10:31 AM  Result Value Ref Range   Cholesterol 182 <200 mg/dL   HDL 43 (L) > OR = 50 mg/dL   Triglycerides 157 (H) <150 mg/dL   LDL Cholesterol (Calc) 112 (H) mg/dL (calc)    Comment: Reference range: <100 . Desirable range <100 mg/dL for primary prevention;   <70 mg/dL for patients with CHD or diabetic patients  with > or = 2 CHD risk factors. Marland Kitchen LDL-C is now calculated using the Martin-Hopkins  calculation, which is a validated novel method providing  better accuracy than the Friedewald equation in the  estimation of LDL-C.  Cresenciano Genre et al. Annamaria Helling. 4098;119(14): 2061-2068  (http://education.[facility].com/faq/FAQ164)    Total CHOL/HDL Ratio 4.2 <5.0 (calc)   Non-HDL Cholesterol (Calc) 139 (H) <130 mg/dL (calc)    Comment: For patients with diabetes plus 1 major ASCVD risk  factor, treating to a non-HDL-C goal of <100 mg/dL  (LDL-C of <70 mg/dL) is considered a therapeutic  option.    TSH     Status: None   Collection Time: 04/16/21 10:31 AM  Result Value Ref Range   TSH 2.09 0.40 - 4.50 mIU/L  Hemoglobin A1c     Status: Abnormal   Collection Time: 04/16/21 10:31 AM  Result Value  Ref Range   Hgb A1c MFr Bld 5.7 (H) <5.7 % of total Hgb    Comment: For someone without known diabetes, a hemoglobin  A1c value between 5.7% and 6.4% is consistent with prediabetes and should be confirmed with a  follow-up test. . For someone with known diabetes, a value <7% indicates that their diabetes is well controlled. A1c targets should be individualized based on duration of diabetes, age, comorbid conditions, and other considerations. . This assay result is consistent with an increased risk of diabetes. . Currently, no consensus exists regarding use of hemoglobin A1c for diagnosis of diabetes for children. .    Mean Plasma Glucose 117 mg/dL   eAG (mmol/L) 6.5 mmol/L  Insulin, random     Status: Abnormal   Collection Time: 04/16/21 10:31 AM  Result Value Ref Range   Insulin 82.5 (H) uIU/mL    Comment:      Reference Range  < or = 19.6 .      Risk:      Optimal          < or = 19.6      Moderate         NA      High             >19.6 .      Adult cardiovascular event risk category      cut points (optimal, moderate, high)      are based on [employer] population      data from 10/2010. . This insulin assay shows strong cross-reactivity for some insulin analogs (lispro, aspart, and glargine) and much lower cross-reactivity with others (detemir, glulisine).   VITAMIN D 25 Hydroxy (Vit-D Deficiency, Fractures)     Status: None   Collection Time: 04/16/21 10:31 AM  Result Value Ref Range   Vit D, 25-Hydroxy 94 30 - 100 ng/mL    Comment: Vitamin D Status         25-OH Vitamin D: . Deficiency:                    <20 ng/mL Insufficiency:             20 - 29 ng/mL Optimal:                 > or = 30 ng/mL . For 25-OH Vitamin D testing on patients on   D2-supplementation and patients for whom quantitation  of D2 and D3 fractions is required, the [facility](TM) 25-OH VIT D, (D2,D3), LC/MS/MS is recommended: order  code [id number] (patients >34yrs). See Note 1 . Note 1 . For additional information, please refer to  http://education.[facility].com/faq/FAQ199  (This link is being provided for informational/ educational purposes only.)      Psychiatric Specialty Exam: Physical Exam  Review of Systems  Weight 117 lb (53.1 kg).There is no height or weight on file to calculate BMI.  General Appearance: NA  Eye Contact:  NA  Speech:  Slow  Volume:  Decreased  Mood:  Depressed, Dysphoric and emotional  Affect:  NA  Thought Process:  Descriptions of Associations: Intact  Orientation:  Full (Time, Place, and Person)  Thought Content:  Rumination  Suicidal Thoughts:  No  Homicidal Thoughts:  No  Memory:  Immediate;   Good Recent;   Good Remote;   Good  Judgement:  Fair  Insight:  Present  Psychomotor Activity:  NA  Concentration:  Concentration: Fair and Attention Span: Fair  Recall:  Good  Fund of Knowledge:  Fair  Language:  Good  Akathisia:  No  Handed:  Right  AIMS (if indicated):     Assets:  Communication Skills Desire for Improvement Housing Social Support Transportation  ADL's:  Intact  Cognition:  WNL  Sleep:   6 hrs      Assessment and Plan: Major depressive disorder, recurrent.  Anxiety.  Grief.  Patient started to feeling very sad and lonely and going through grief as recently saw her father's house.  She admitted feeling disappointed because family members have blocked her and does not talk to her anymore.  She is not sure what she should do as she tried to connect with church and that did not work and work in the yard but pulled her muscle.  We have tried higher dose of limit told that she could not handle and make her very sleepy and dizzy.  We have offer multiple times therapy but patient admitted  due to financial reasons and Medicaid insurance she has a hard time finding a therapist.  I recommend she should call her insurance company if any therapist is in her network.  We will also try to find a therapist who accept her insurance.  After some discussion she agreed to give a try of Cymbalta 60 mg to see if that helps her anxiety and depression.  We talked about grief counseling but patient does not want to do hospice.  She has enough refills on amitriptyline 25 mg at bedtime, lamotrigine 25 mg twice a day and Klonopin was recently help.  We are giving her only 1 prescription of Cymbalta 60 mg for 30 days to try.  I recommend to call us back if she feels worsening of the symptoms or anytime having suicidal thoughts or homicidal thought.  Discussed medication side effects and benefits.  Follow-up in 4 weeks.   Follow Up Instructions:    I discussed the assessment and treatment plan with the patient. The patient was provided an opportunity to ask questions and all were answered. The patient agreed with the plan and demonstrated an understanding of the instructions.   The patient was advised to call back or seek an in-person evaluation if the symptoms worsen or if the condition fails to improve as anticipated.  I provided 30 minutes of non-face-to-face time during this encounter.   Kathlee Nations, MD

## 2021-05-07 ENCOUNTER — Other Ambulatory Visit (HOSPITAL_COMMUNITY): Payer: Self-pay | Admitting: *Deleted

## 2021-05-07 ENCOUNTER — Other Ambulatory Visit: Payer: Self-pay

## 2021-05-07 ENCOUNTER — Ambulatory Visit (INDEPENDENT_AMBULATORY_CARE_PROVIDER_SITE_OTHER): Payer: PPO | Admitting: Licensed Clinical Social Worker

## 2021-05-07 DIAGNOSIS — F411 Generalized anxiety disorder: Secondary | ICD-10-CM | POA: Diagnosis not present

## 2021-05-07 DIAGNOSIS — F331 Major depressive disorder, recurrent, moderate: Secondary | ICD-10-CM

## 2021-05-07 DIAGNOSIS — F330 Major depressive disorder, recurrent, mild: Secondary | ICD-10-CM | POA: Diagnosis not present

## 2021-05-07 MED ORDER — LAMOTRIGINE 25 MG PO TABS: 25.0000 mg | ORAL_TABLET | Freq: Two times a day (BID) | ORAL | 0 refills | Status: DC
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
## 2021-05-21 ENCOUNTER — Other Ambulatory Visit: Payer: Self-pay

## 2021-05-21 ENCOUNTER — Ambulatory Visit (HOSPITAL_COMMUNITY): Payer: PPO | Admitting: Licensed Clinical Social Worker

## 2021-05-21 ENCOUNTER — Other Ambulatory Visit: Payer: Self-pay | Admitting: Internal Medicine

## 2021-05-21 DIAGNOSIS — F411 Generalized anxiety disorder: Secondary | ICD-10-CM

## 2021-05-21 DIAGNOSIS — F330 Major depressive disorder, recurrent, mild: Secondary | ICD-10-CM | POA: Diagnosis not present

## 2021-05-21 NOTE — Progress Notes (Signed)
THERAPIST PROGRESS NOTE   Session Time: 10:10am - 11:00am   Location: Patient: [facility] [place] [facility] Provider: [facility] [place] [facility]   Participation Level: Active    Behavioral Response: Alert, casually dressed, depressed mood/affect   Type of Therapy:  Individual Therapy   Treatment Goals addressed: Depression management; Medication compliance    Interventions: CBT: self-care activities     Summary: Sarah Rodriguez is a 65 year old married Caucasian female on disability that presented for therapy appointment today with diagnoses of Major depressive disorder, recurrent, mild; and Generalized Anxiety Disorder.        Suicidal/Homicidal: None; without intent or plan.     Therapist Response:  Clinician met with Manuela Schwartz for therapy appointment today10 minutes later due to a personal emergency, and assessed for safety, medication compliance, and sobriety.  Zilda presented for today's session on time and was alert, oriented x5, with no evidence or self-report of active SI/HI or A/V H.  Shantil reported compliance with medication and denied use of alcohol or illicit substances. Clinician inquired about Benigna's emotional ratings today, as well as any significant changes in thoughts, feelings, or behavior since completion of assessment.  Patti reported scores of 6/10 for depression, 2/10 for anxiety, and 6/10 for anger/irritability.  She denied any reoccurrence of panic attacks.  Maecy reported that this week has been going okay, as she has been making an effort to stay productive by organizing things around the home, and reach out to people to socialize so she doesn't feel as lonely.  She reported that oftentimes many things feel like a 'chore' for her though, including working with her husband on their [place].  Clinician discussed topic of self-care with Harleen today.  Clinician encouraged Amanpreet to set aside time during the week to do things she enjoys, and inquired about which specific self-care activities would be  appealing for her to schedule in days ahead.  Clinician also provided helpful tips to aid in this process, including setting specific goals, making healthy activities a habit, setting appropriate boundaries to protect her developing routine, and being mindful of maladaptive behaviors that could cause additional issues, such as substance use.  Kiersten reported that due to her disability, physical activities can be taxing, but she enjoys watching TV, movies, reading, playing mobile games, and doing crossword puzzles.  She reported that she has considered sewing at one point, and selling some items that belonged to her mother in order to further straighten up, declutter the home and alleviate feelings of grief, but has lacked motivation to follow through at this time. Intervention effectiveness could not be measured at this time, as Gayathri reported mixed feelings about whether she considered today's session to benefit her.  She stated "Its difficult to talk about things, but everyone is telling me that I need to".  Lynia reported that she has felt pressured to enter therapy by support system for several years and stated "I worry about dredging up the past".  Clinician reminded Diedre that therapy is voluntary and he did not wish to make her feel like this was an obligation if she is not comfortable, in addition to encouraging her to remain honest about effectiveness of current approach in case referral to alternative therapist would be preferable.  She reported that she would try a few more sessions and wishes to focus upon communication skills and boundaries next, stating "I need tools to help me learn to have healthy, normal relationships and avoid being so controlling".  Clinician will continue  to monitor.     Plan: Follow up for next session in 2 weeks.   Diagnosis: Major depressive disorder, recurrent, mild; and Generalized Anxiety Disorder.   Shade Flood, LCSW, LCAS 05/21/21

## 2021-06-04 ENCOUNTER — Encounter (HOSPITAL_COMMUNITY): Payer: Self-pay | Admitting: Psychiatry

## 2021-06-04 ENCOUNTER — Other Ambulatory Visit: Payer: Self-pay

## 2021-06-04 ENCOUNTER — Telehealth (INDEPENDENT_AMBULATORY_CARE_PROVIDER_SITE_OTHER): Payer: PPO | Admitting: Psychiatry

## 2021-06-04 DIAGNOSIS — F331 Major depressive disorder, recurrent, moderate: Secondary | ICD-10-CM

## 2021-06-04 DIAGNOSIS — F411 Generalized anxiety disorder: Secondary | ICD-10-CM | POA: Diagnosis not present

## 2021-06-04 MED ORDER — AMITRIPTYLINE HCL 25 MG PO TABS: 25.0000 mg | ORAL_TABLET | Freq: Every day | ORAL | 0 refills | Status: DC

## 2021-06-04 MED ORDER — DULOXETINE HCL 40 MG PO CPEP: 40.0000 mg | ORAL_CAPSULE | Freq: Every day | ORAL | 0 refills | Status: DC

## 2021-06-04 MED ORDER — CLONAZEPAM 0.5 MG PO TABS: ORAL_TABLET | ORAL | 1 refills | Status: DC

## 2021-06-04 MED ORDER — LAMOTRIGINE 25 MG PO TABS: 25.0000 mg | ORAL_TABLET | Freq: Two times a day (BID) | ORAL | 1 refills | Status: DC

## 2021-06-04 NOTE — Progress Notes (Signed)
Virtual Visit via Telephone Note  I connected with Sarah Rodriguez on 06/04/21 at  9:00 AM EDT by telephone and verified that I am speaking with the correct person using two identifiers.  Location: Patient: Home Provider: Home Office   I discussed the limitations, risks, security and privacy concerns of performing an evaluation and management service by telephone and the availability of in person appointments. I also discussed with the patient that there may be a patient responsible charge related to this service. The patient expressed understanding and agreed to proceed.   History of Present Illness: Patient is evaluated by phone session.  She is actually doing much better and had a very good visit to [place] to see her daughter.  She went on [place] Day weekend.  She also had 2 sessions with therapist and feeling much better.  She is able to connect with her friends and she understand everyone else is busy in her life and she needs to come up for herself to keep busy.  She also started yard work.  She is not walking because it is too hot but hoping to resume very soon.  She is sleeping better.  She denies any crying spells or any feeling of hopelessness.  She was pleased because her best friend contact her and she joined the group with other friends.  We have tried higher dose of Cymbalta on the last visit but patient could not tolerate and back to Cymbalta 40 mg as she was feeling very sleepy and groggy with 60 mg.  She still feels sometimes emotional but overall feeling much better.  She understand that she needs to have coping skills in her life.  Her appetite is okay.  Her weight is stable.  She denies any panic attack.  She takes one fourth of Klonopin every day that helps with anxiety.  She also takes Lamictal which is helping her mood.  She has no rash or any itching.   Past Psychiatric History:  H/O depression and taking antidepressants since 1996.  H/O panic attacks while  driving.  Saw Dr. Letta Moynahan and tried Prozac, Paxil, Zoloft, Xanax, Cymbalta and Klonopin.  No h/o inpatient treatment or any suicidal attempt.  Psychiatric Specialty Exam: Physical Exam  Review of Systems  Weight 117 lb (53.1 kg).There is no height or weight on file to calculate BMI.  General Appearance: NA  Eye Contact:  NA  Speech:  Clear and Coherent and Normal Rate  Volume:  Normal  Mood:  Euthymic  Affect:  NA  Thought Process:  Goal Directed  Orientation:  Full (Time, Place, and Person)  Thought Content:  WDL  Suicidal Thoughts:  No  Homicidal Thoughts:  No  Memory:  Immediate;   Good Recent;   Good Remote;   Good  Judgement:  Intact  Insight:  Present  Psychomotor Activity:  NA  Concentration:  Concentration: Fair and Attention Span: Fair  Recall:  Good  Fund of Knowledge:  Good  Language:  Good  Akathisia:  No  Handed:  Right  AIMS (if indicated):     Assets:  Communication Skills Desire for Improvement Housing Social Support Transportation  ADL's:  Intact  Cognition:  WNL  Sleep:   8 hrs      Assessment and Plan: Major depressive disorder, recurrent.  Anxiety.  Patient doing much better since started therapy and learning coping skills.  She could not tolerate Cymbalta 60 mg and like to go back on 40 mg which I  agree.  We discussed medication side effects and benefits.  She has no more dizziness since she cut down the Cymbalta back to 40.  Encouraged to continue therapy with Georgina Snell.  Continue amitriptyline 25 mg at bedtime, lamotrigine 25 mg twice a day, Klonopin taking one fourth of 0.5 mg every day and Cymbalta back to 40 mg daily.  Recommended to call us back if there is any question or any concern.  Follow-up in 2 months.  Follow Up Instructions:    I discussed the assessment and treatment plan with the patient. The patient was provided an opportunity to ask questions and all were answered. The patient agreed with the plan and demonstrated an  understanding of the instructions.   The patient was advised to call back or seek an in-person evaluation if the symptoms worsen or if the condition fails to improve as anticipated.  I provided 17 minutes of non-face-to-face time during this encounter.   Kathlee Nations, MD

## 2021-06-08 ENCOUNTER — Other Ambulatory Visit: Payer: Self-pay

## 2021-06-08 ENCOUNTER — Ambulatory Visit (INDEPENDENT_AMBULATORY_CARE_PROVIDER_SITE_OTHER): Payer: PPO | Admitting: Licensed Clinical Social Worker

## 2021-06-08 ENCOUNTER — Other Ambulatory Visit: Payer: Self-pay | Admitting: Internal Medicine

## 2021-06-08 DIAGNOSIS — F330 Major depressive disorder, recurrent, mild: Secondary | ICD-10-CM | POA: Diagnosis not present

## 2021-06-08 DIAGNOSIS — H6523 Chronic serous otitis media, bilateral: Secondary | ICD-10-CM

## 2021-06-08 DIAGNOSIS — F411 Generalized anxiety disorder: Secondary | ICD-10-CM

## 2021-06-08 NOTE — Progress Notes (Signed)
Virtual Visit via Telephone Note   I connected with Sarah Rodriguez on 06/08/21 at 1:00pm by telephone and verified that I am speaking with the correct person using two identifiers.   I discussed the limitations, risks, security and privacy concerns of performing an evaluation and management service by telephone and the availability of in person appointments. I also discussed with the patient that there may be a patient responsible charge related to this service. The patient expressed understanding and agreed to proceed.   I discussed the assessment and treatment plan with the patient. The patient was provided an opportunity to ask questions and all were answered. The patient agreed with the plan and demonstrated an understanding of the instructions.   The patient was advised to call back or seek an in-person evaluation if the symptoms worsen or if the condition fails to improve as anticipated.   I provided 1 hour of non-face-to-face time during this encounter.     Shade Flood, LCSW, LCAS ________________________________  THERAPIST PROGRESS NOTE   Session Time: 1:00pm - 2:00pm   Location: Patient: Patient home  Provider: [facility]   Participation Level: Active    Behavioral Response: Alert, depressed mood   Type of Therapy:  Individual Therapy   Treatment Goals addressed: Depression management; Medication compliance    Interventions: CBT, healthy boundaries   Summary: Sarah Rodriguez is a 65 year old married Caucasian female on disability that presented for therapy appointment today with diagnoses of Major depressive disorder, recurrent, mild; and Generalized Anxiety Disorder.        Suicidal/Homicidal: None; without intent or plan.     Therapist Response:  Clinician spoke with Sarah Rodriguez via telephone today, as she informed front desk that she is currently sick and did not feel that she should come in-person.  Clinician assessed for safety, medication compliance, and sobriety.   Sarah Rodriguez spoke in a manner that was alert, oriented x5, with no evidence or self-report of active SI/HI or A/V H.  Sarah Rodriguez reported that she continues taking medication as prescribed and denied use of alcohol or illicit substances. Clinician inquired about Sarah Rodriguez's current emotional ratings, as well as any significant changes in thoughts, feelings, or behavior since last check-in.  Sarah Rodriguez reported scores of 3/10 for depression, 2/10 for anxiety, and 3/10 for anger/irritability.  She denied any reoccurrence of panic attacks.  Sarah Rodriguez reported that she is experiencing symptoms of congestion and fever, and plans to take a COVID test today.  She reported that she visited her hometown over the past weekend, and was able to see her daughter as well.  Sarah Rodriguez reported that they went shopping, cooked out, and shot fireworks, which was nice.  She reported that they have not been as close in recent years, but she is hopeful that things are improving in the relationship, stating "I think we're getting back to that place we used to be".  She reported that she wished to speak about boundaries today, as this was the primary issue which drove them apart years ago.  Clinician utilized a handout on boundaries with Sarah Rodriguez to guide discussion.  This handout defined boundaries as the limits and rules that we set for ourselves within relationships, and included 3 categories of boundaries (i.e. porous, rigid, and healthy), along with the typical behaviors associated with each, such as someone with rigid boundaries being overprotective of their personal information.  Clinician inquired about which traits Sarah Rodriguez most associated with at present, and explored strategies for improvement so that she  can strengthen support and quality of present relationships during treatment.  Intervention was effective, as evidenced by Sarah Rodriguez actively engaging in discussion on the subject and reporting that this greatly improved her insight into boundaries within her  relationships, as she used to have very porous boundaries and would overshare, be dependent upon the opinions of others, and become overinvolved in their problems as well.  Iyahna acknowledged that she developed more rigid boundaries with time, and has less relationships overall now, but hopes to achieve a healthy balance so that she can become closer to her daughter and develop new meaningful friendships.  Delanna reported motivation to look into books on this subject for individual research in her free time, and asked to have handout emailed to her to review.  Clinician emailed her a copy of the handout and will continue to monitor.   Plan: Follow up for next session in 2 weeks.   Diagnosis: Major depressive disorder, recurrent, mild; and Generalized Anxiety Disorder.   Shade Flood, LCSW, LCAS 06/08/21

## 2021-06-15 ENCOUNTER — Other Ambulatory Visit: Payer: Self-pay | Admitting: Adult Health

## 2021-06-15 DIAGNOSIS — J301 Allergic rhinitis due to pollen: Secondary | ICD-10-CM

## 2021-07-09 ENCOUNTER — Ambulatory Visit (INDEPENDENT_AMBULATORY_CARE_PROVIDER_SITE_OTHER): Payer: PPO | Admitting: Licensed Clinical Social Worker

## 2021-07-09 ENCOUNTER — Other Ambulatory Visit: Payer: Self-pay

## 2021-07-09 DIAGNOSIS — F330 Major depressive disorder, recurrent, mild: Secondary | ICD-10-CM

## 2021-07-09 DIAGNOSIS — F411 Generalized anxiety disorder: Secondary | ICD-10-CM

## 2021-07-09 NOTE — Progress Notes (Signed)
THERAPIST PROGRESS NOTE   Session Time: 11:10am - 12:00pm    Location: Patient: [facility] Provider: [facility]   Participation Level: Active    Behavioral Response: Alert, casually dressed, angry mood/affect   Type of Therapy:  Individual Therapy   Treatment Goals addressed: Depression management; Medication compliance; Attending bible study    Interventions: CBT, healthy boundaries, anger management    Summary: Sarah Rodriguez is a 65 year old married Caucasian female on disability that presented for therapy appointment today with diagnoses of Major depressive disorder, recurrent, mild; and Generalized Anxiety Disorder.        Suicidal/Homicidal: None; without intent or plan.     Therapist Response:  Clinician met with Sarah Rodriguez for in person therapy today, although this was held 10 minutes later than planned due to misunderstanding about this originally being scheduled as virtual.  Clinician assessed for safety, medication compliance, and sobriety.  Sarah Rodriguez presented for appointment on time, and was alert, oriented x5, with no evidence or self-report of active SI/HI or A/V H.  Sarah Rodriguez reported ongoing compliance with medication and denied use of alcohol or illicit substances. Clinician inquired about Sarah Rodriguez's emotional ratings today, as well as any significant changes in thoughts, feelings, or behavior since previous check-in.  Sarah Rodriguez reported scores of 7/10 for depression, 2/10 for anxiety, and 7/10 for anger.  She denied any reoccurrence of panic attacks or outbursts.  Clinician inquired about what was influencing Sarah Rodriguez's depression and anger today, noting that it was significantly higher than last check-in.  Sarah Rodriguez reported that she had plans to go on vacation with her husband and some friends, but this trip had to get cut short due to another family members plans interfering.  Sarah Rodriguez reported that she had been looking forward to this trip, and was very disappointed as a result, stating  "Everything as going so smoothly until this happened".  Clinician discussed topic of anger triggers with Sarah Rodriguez today to help her gain insight into recent issues and explore solutions to manage anger more effectively.  Clinician provided examples for each category (i.e. internal and external), in addition to discussing telltale physical and mental signs of anger that could serve as red flags to be mindful of when exposed to significant triggers.  Clinician inquired about Sarah Rodriguez's present coping skills for anger, and provided additional suggestions on how to intervene during these episodes.  Clinician also reviewed material on establishing healthy boundaries with Sarah Rodriguez, and encouraged her to prioritize time for self-care each day in order to ensure healthy outlet for stress.  Interventions were effective, as evidenced by Sarah Rodriguez acknowledging that there are several triggers that influence anger, including external ones such as experiencing unexpected changes in plans, having too much to do during the day, and internal ones such as feelings of exhaustion, depression, disappointment, and anxiety.  Sarah Rodriguez reported that she always feels too busy for self-care or coping skills, stating "There's been no relaxation time, there's always so much to do".  She acknowledged that she would benefit from lightening her weekly schedule, and focus more on self-care activities such as visiting the [place] to swim, and begin attending bible study x1 per week so that she can increase spiritual and community support. Sarah Rodriguez stated "This really helped me identify my issues.  I need to stop thinking about the past so much, and think about what I can do for myself today".  Clinician will continue to monitor.  Plan: Follow up for next session in 2 weeks.  Diagnosis: Major depressive disorder, recurrent, mild; and Generalized Anxiety Disorder.   Shade Flood, LCSW, LCAS 07/09/21

## 2021-07-13 ENCOUNTER — Telehealth (HOSPITAL_COMMUNITY): Payer: Self-pay | Admitting: *Deleted

## 2021-07-13 ENCOUNTER — Other Ambulatory Visit (HOSPITAL_COMMUNITY): Payer: Self-pay | Admitting: *Deleted

## 2021-07-13 DIAGNOSIS — F331 Major depressive disorder, recurrent, moderate: Secondary | ICD-10-CM

## 2021-07-13 DIAGNOSIS — F411 Generalized anxiety disorder: Secondary | ICD-10-CM

## 2021-07-13 MED ORDER — CLONAZEPAM 0.5 MG PO TABS: ORAL_TABLET | ORAL | 1 refills | Status: DC

## 2021-07-13 NOTE — Telephone Encounter (Signed)
If she had used her refill than she need a new prescription. Please call the refill. Thanks

## 2021-07-13 NOTE — Telephone Encounter (Signed)
Pt called requesting a refill of the Klonopin 0.5 mg 1/2 to whole tablet qd prn last ordered 7/722 #20 with one refill. Pt says that she's involved in a wedding as has been very anxious about this. Pt next appointment is on 08/12/21. Please review.

## 2021-07-15 ENCOUNTER — Other Ambulatory Visit (HOSPITAL_COMMUNITY): Payer: Self-pay | Admitting: Psychiatry

## 2021-07-15 DIAGNOSIS — F331 Major depressive disorder, recurrent, moderate: Secondary | ICD-10-CM

## 2021-07-15 DIAGNOSIS — F411 Generalized anxiety disorder: Secondary | ICD-10-CM

## 2021-08-05 ENCOUNTER — Other Ambulatory Visit (HOSPITAL_COMMUNITY): Payer: Self-pay | Admitting: *Deleted

## 2021-08-05 DIAGNOSIS — F331 Major depressive disorder, recurrent, moderate: Secondary | ICD-10-CM

## 2021-08-05 MED ORDER — LAMOTRIGINE 25 MG PO TABS: 25.0000 mg | ORAL_TABLET | Freq: Two times a day (BID) | ORAL | 0 refills | Status: DC

## 2021-08-12 ENCOUNTER — Other Ambulatory Visit: Payer: Self-pay

## 2021-08-12 ENCOUNTER — Encounter (HOSPITAL_COMMUNITY): Payer: Self-pay | Admitting: Psychiatry

## 2021-08-12 ENCOUNTER — Telehealth (INDEPENDENT_AMBULATORY_CARE_PROVIDER_SITE_OTHER): Payer: PPO | Admitting: Psychiatry

## 2021-08-12 DIAGNOSIS — F411 Generalized anxiety disorder: Secondary | ICD-10-CM

## 2021-08-12 DIAGNOSIS — F331 Major depressive disorder, recurrent, moderate: Secondary | ICD-10-CM | POA: Diagnosis not present

## 2021-08-12 MED ORDER — CLONAZEPAM 0.5 MG PO TABS: ORAL_TABLET | ORAL | 1 refills | Status: DC

## 2021-08-12 MED ORDER — AMITRIPTYLINE HCL 25 MG PO TABS: 25.0000 mg | ORAL_TABLET | Freq: Every day | ORAL | 0 refills | Status: DC

## 2021-08-12 MED ORDER — DULOXETINE HCL 40 MG PO CPEP: 40.0000 mg | ORAL_CAPSULE | Freq: Every day | ORAL | 0 refills | Status: DC

## 2021-08-12 MED ORDER — LAMOTRIGINE 25 MG PO TABS: 25.0000 mg | ORAL_TABLET | Freq: Two times a day (BID) | ORAL | 2 refills | Status: DC

## 2021-08-12 NOTE — Progress Notes (Signed)
Virtual Visit via Telephone Note  I connected with Sarah Rodriguez on 08/12/21 at 10:40 AM EDT by telephone and verified that I am speaking with the correct person using two identifiers.  Location: Patient: home Provider: home office   I discussed the limitations, risks, security and privacy concerns of performing an evaluation and management service by telephone and the availability of in person appointments. I also discussed with the patient that there may be a patient responsible charge related to this service. The patient expressed understanding and agreed to proceed.   History of Present Illness: Patient is evaluated by phone session.  Patient reported she was doing fine and started therapy with Sarah Rodriguez however last week during she was in [place] to attend her stepdaughter's wedding and had a bad weekend.  She apologized not taking the medication and she missed the dose and she saw her own biological daughter and had an argument that led increase emotions.  She was crying and feeling bad about herself.  However since she resumed the medication she is feeling better.  Last night she slept well.  She understand that she need to work well with her therapist on her social and coping skills.  She admitted easily emotional when things are not in her favor.  She is sleeping good.  Her appetite is okay.  Her energy level is fair.  Her weight fluctuates to 3 pounds.  She denies any major panic attack.  She still takes one fourth of Klonopin every night that helps her anxiety and sleep.  She has no rash, itching, tremors or shakes.  She elected to increase the dose of the Lamictal.  She wants to keep the current medication.  She has no more dizziness since we have cut down the Cymbalta dose 40 mg.  Past Psychiatric History:  H/O depression and taking antidepressants since 1996.  H/O panic attacks while driving.  Saw Dr. Letta Moynahan and tried Prozac, Paxil, Zoloft, Xanax, Cymbalta and Klonopin.  No  h/o inpatient treatment or any suicidal attempt.  Psychiatric Specialty Exam: Physical Exam  Review of Systems  Weight 120 lb (54.4 kg).There is no height or weight on file to calculate BMI.  General Appearance: NA  Eye Contact:  NA  Speech:  Normal Rate  Volume:  Normal  Mood:  Anxious and emotional  Affect:  NA  Thought Process:  Goal Directed  Orientation:  Full (Time, Place, and Person)  Thought Content:  WDL  Suicidal Thoughts:  No  Homicidal Thoughts:  No  Memory:  Immediate;   Good Recent;   Good Remote;   Good  Judgement:  Fair  Insight:  Shallow  Psychomotor Activity:  NA  Concentration:  Concentration: Good and Attention Span: Good  Recall:  Good  Fund of Knowledge:  Good  Language:  Good  Akathisia:  No  Handed:  Right  AIMS (if indicated):     Assets:  Communication Skills Desire for Improvement Housing Resilience  ADL's:  Intact  Cognition:  WNL  Sleep:   ok      Assessment and Plan: Minipress episodic, recurrent.  Anxiety.  Discuss about compliance issues with the medication as some other medication can cause withdrawal symptoms.  Encourage to keep the medication when she is going out of town or traveling.  Encouraged to keep appointment therapy with Sarah Rodriguez.  Continue Cymbalta 40 mg daily, lamotrigine 25 mg twice a day, amitriptyline 25 mg at bedtime and Klonopin one fourth of 0.5 mg every  night.  Patient reluctant to increase the Lamictal.  Recommended to call us back if she has any question or any concern.  Follow-up in 3 months.  Follow Up Instructions:    I discussed the assessment and treatment plan with the patient. The patient was provided an opportunity to ask questions and all were answered. The patient agreed with the plan and demonstrated an understanding of the instructions.   The patient was advised to call back or seek an in-person evaluation if the symptoms worsen or if the condition fails to improve as anticipated.  I provided 18  minutes of non-face-to-face time during this encounter.   Kathlee Nations, MD

## 2021-08-20 ENCOUNTER — Ambulatory Visit (HOSPITAL_COMMUNITY): Payer: PPO | Admitting: Licensed Clinical Social Worker

## 2021-08-23 ENCOUNTER — Other Ambulatory Visit: Payer: Self-pay | Admitting: Internal Medicine

## 2021-08-23 DIAGNOSIS — K219 Gastro-esophageal reflux disease without esophagitis: Secondary | ICD-10-CM

## 2021-09-02 ENCOUNTER — Ambulatory Visit (HOSPITAL_COMMUNITY): Payer: PPO | Admitting: Licensed Clinical Social Worker

## 2021-09-03 ENCOUNTER — Ambulatory Visit (INDEPENDENT_AMBULATORY_CARE_PROVIDER_SITE_OTHER): Payer: PPO | Admitting: Licensed Clinical Social Worker

## 2021-09-03 ENCOUNTER — Other Ambulatory Visit: Payer: Self-pay

## 2021-09-03 DIAGNOSIS — F411 Generalized anxiety disorder: Secondary | ICD-10-CM

## 2021-09-03 DIAGNOSIS — F330 Major depressive disorder, recurrent, mild: Secondary | ICD-10-CM | POA: Diagnosis not present

## 2021-09-03 NOTE — Progress Notes (Signed)
THERAPIST PROGRESS NOTE   Session Time: 2:00pm - 3:00pm   Location: Patient: [facility] Provider: [facility]   Participation Level: Active    Behavioral Response: Alert, casually dressed, depressed mood/affect   Type of Therapy:  Individual Therapy   Treatment Goals addressed: Depression and anxiety management; Medication compliance;    Interventions: CBT: psychoeducation on core beliefs      Summary: Sarah Rodriguez is a 65 year old married Caucasian female on disability that presented for therapy appointment today with diagnoses of Major depressive disorder, recurrent, mild; and Generalized Anxiety Disorder.        Suicidal/Homicidal: None; without intent or plan.     Therapist Response:  Clinician met with Sarah Rodriguez for in person appointment today, and assessed for safety, medication compliance, and sobriety.  Sarah Rodriguez presented for session on time, and was alert, oriented x5, with no evidence or self-report of active SI/HI or A/V H.  Sarah Rodriguez reported that she continues taking medication as prescribed and denied use of alcohol or illicit substances. Clinician inquired about Sarah Rodriguez's current emotional ratings, as well as any significant changes in thoughts, feelings, or behavior since last check-in.  Sarah Rodriguez reported scores of 9/10 for depression, 7/10 for anxiety, and 8/10 for anger/irritability.  She reported that she is having outbursts at least once per day, but denied any reoccurrence of panic attacks.  Clinician inquired about what has been triggering these outbursts, and how Sarah Rodriguez has been attempting to cope.  Sarah Rodriguez reported that these outbursts have been influenced by a pervasive, negative mood that her husband points out frequently, stating "I know I've got to stop thinking like this".  Clinician utilized a handout on core beliefs with Sarah Rodriguez on the subject and assist.  This handout defined core beliefs as the most central ideas one holds about themselves, which can  also influence automatic negative thoughts.  Clinician utilized CBT concept to explain how this thinking affects feelings and behavior in turn, and examples of common negative core beliefs were listed, such as "I am a failure", "I am unlovable", and "Nothing ever goes right".  Consequences of allowing core beliefs to remain were listed as well, including tendency to distrust others, aggressive behavior, unhealthy boundaries, and worsening anxiety, depression, or reliance upon drugs/alcohol to cope.  Clinician tasked Sarah Rodriguez with identifying some of her negative core beliefs, and discussed strategies for countering negative thoughts in order to resolve them, such as reciting daily positive affirmations, or looking for evidence that contradicts these beliefs.  Intervention was effective, as evidenced by Sarah Rodriguez actively engaging in Rodriguez on subject and recognizing that several of these core beliefs are negatively influencing her outlook, such as "I don't belong anywhere", "I am worthless", and "I'm a bad person".  Sarah Rodriguez was receptive to strategies offered for challenging these beliefs, and was able to identify evidence which contradicted them as well.  Sarah Rodriguez expressed motivation and hope for working to resolve these long-held beliefs, stating "I know that there is a light at the end of the tunnel".  Clinician provided her with a handout to review, and will continue to monitor.     Plan: Follow up for next session in 2 weeks.   Diagnosis: Major depressive disorder, recurrent, mild; and Generalized Anxiety Disorder.   Shade Flood, LCSW, LCAS 09/03/21

## 2021-09-17 ENCOUNTER — Ambulatory Visit (INDEPENDENT_AMBULATORY_CARE_PROVIDER_SITE_OTHER): Payer: PPO | Admitting: Licensed Clinical Social Worker

## 2021-09-17 ENCOUNTER — Other Ambulatory Visit: Payer: Self-pay

## 2021-09-17 DIAGNOSIS — F330 Major depressive disorder, recurrent, mild: Secondary | ICD-10-CM

## 2021-09-17 DIAGNOSIS — F411 Generalized anxiety disorder: Secondary | ICD-10-CM | POA: Diagnosis not present

## 2021-09-17 NOTE — Progress Notes (Signed)
THERAPIST PROGRESS NOTE   Session Time: 1:00pm - 2:00pm   Location: Patient: [facility] [facility] Provider: [facility]   Participation Level: Active    Behavioral Response: Alert, casually dressed, depressed mood/affect    Type of Therapy:  Individual Therapy   Treatment Goals addressed: Depression and anxiety management; Medication compliance   Interventions: CBT, anger management, healthy boundaries       Summary: Santosha Jividen is a 65 year old married Caucasian female on disability that presented for therapy appointment today with diagnoses of Major depressive disorder, recurrent, mild; and Generalized Anxiety Disorder.        Suicidal/Homicidal: None; without intent or plan.     Therapist Response:  Clinician met with Solenne for in person session today, and assessed for safety, medication compliance, and sobriety.  Chaslyn presented for appointment on time, and was alert, oriented x5, with no evidence or self-report of active SI/HI or A/V H.  Shabrea reported ongoing compliance with medication and denied use of alcohol or illicit substances. Clinician inquired about Roopa's emotional ratings today, as well as any significant changes in thoughts, feelings, or behavior since previous check-in.  Aki reported scores of 5/10 for depression, 1/10 for anxiety, and 2/10 for anger/irritability.  She denied any recent panic attacks or outbursts.  Aalyiah reported that a recent struggle was unexpectedly opening up to a friend that visited, and regretting this decision afterward, stating "I got upset at myself for showing vulnerability.  I felt weak".  Clinician explained how opening up to one's supports can be considered a healthy coping skill with the right person, under the right circumstances.  Clinician discussed subject of emotional boundaries with Kaelani to guide discussion in this area, including importance of being cognizant of limitations on when and what to share based upon the boundary set in a  particular relationship.  Shadie reported that based upon how long she has known this person, and what they spoke of before Jorgia decided to share, she felt more comfortable discussing topic of grief and loss in her own family.  Zuleyka reported that upon reflecting upon this, she felt better for letting off her 'emotional mask', and more motivated to be authentic and open when meeting new people.  Sheba reported that an additional challenge upon sharing with her friend was processing feelings of anger that arose.  Clinician utilized a handout titled 'anger iceberg' which explained how anger can oftentimes be displayed outwardly, but other emotions could lie beneath the surface and contribute to this feeling, such as sadness, insecurity, hunger, guilt, shame, and more.  Clinician tasked Manuela Schwartz with reflecting upon which emotions might have been influencing this mood, and discussed strategies for coping with difficult emotions in healthy ways rather than repression.  Intervention was effective, as evidenced by Manuela Schwartz actively participating in discussion on the subject, and reporting that this helped her better understand her emotions, including the link between feelings such as disappointment, insecurity, fear, and grief to her surface anger.  Vernestine reported that she would continue making efforts to open up more with people she can trust to reduce isolation, meet new people through participation in social events like bible study, and practice communication skills.  Clinician provided her with handout to review, and will continue to monitor.     Plan: Follow up for next session in 2 weeks.   Diagnosis: Major depressive disorder, recurrent, mild; and Generalized Anxiety Disorder.   Shade Flood, LCSW, LCAS 09/17/21

## 2021-09-30 ENCOUNTER — Ambulatory Visit (INDEPENDENT_AMBULATORY_CARE_PROVIDER_SITE_OTHER): Payer: PPO | Admitting: Internal Medicine

## 2021-09-30 ENCOUNTER — Encounter: Payer: Self-pay | Admitting: Internal Medicine

## 2021-09-30 ENCOUNTER — Other Ambulatory Visit: Payer: Self-pay

## 2021-09-30 VITALS — BP 140/76 | HR 78 | Temp 97.9°F | Resp 16 | Ht 61.5 in | Wt 118.8 lb

## 2021-09-30 DIAGNOSIS — I10 Essential (primary) hypertension: Secondary | ICD-10-CM | POA: Diagnosis not present

## 2021-09-30 DIAGNOSIS — E782 Mixed hyperlipidemia: Secondary | ICD-10-CM

## 2021-09-30 DIAGNOSIS — F3341 Major depressive disorder, recurrent, in partial remission: Secondary | ICD-10-CM | POA: Diagnosis not present

## 2021-09-30 DIAGNOSIS — Z79899 Other long term (current) drug therapy: Secondary | ICD-10-CM

## 2021-09-30 DIAGNOSIS — G43109 Migraine with aura, not intractable, without status migrainosus: Secondary | ICD-10-CM

## 2021-09-30 DIAGNOSIS — Z0000 Encounter for general adult medical examination without abnormal findings: Secondary | ICD-10-CM

## 2021-09-30 DIAGNOSIS — E039 Hypothyroidism, unspecified: Secondary | ICD-10-CM

## 2021-09-30 DIAGNOSIS — Z0001 Encounter for general adult medical examination with abnormal findings: Secondary | ICD-10-CM

## 2021-09-30 DIAGNOSIS — R7309 Other abnormal glucose: Secondary | ICD-10-CM

## 2021-09-30 DIAGNOSIS — G8929 Other chronic pain: Secondary | ICD-10-CM

## 2021-09-30 DIAGNOSIS — E559 Vitamin D deficiency, unspecified: Secondary | ICD-10-CM

## 2021-09-30 DIAGNOSIS — Z8249 Family history of ischemic heart disease and other diseases of the circulatory system: Secondary | ICD-10-CM

## 2021-09-30 DIAGNOSIS — K219 Gastro-esophageal reflux disease without esophagitis: Secondary | ICD-10-CM

## 2021-09-30 DIAGNOSIS — Z87891 Personal history of nicotine dependence: Secondary | ICD-10-CM

## 2021-09-30 DIAGNOSIS — Z136 Encounter for screening for cardiovascular disorders: Secondary | ICD-10-CM

## 2021-09-30 DIAGNOSIS — Z1211 Encounter for screening for malignant neoplasm of colon: Secondary | ICD-10-CM

## 2021-09-30 NOTE — Patient Instructions (Signed)

## 2021-09-30 NOTE — Progress Notes (Signed)
Annual Screening/Preventative Visit & Comprehensive Evaluation &  Examination  Future Appointments  Date Time Provider [place]  09/30/2021  3:00 PM Unk Pinto, MD [facility] None  10/15/2021  1:00 PM Shade Flood [facility] None  11/05/2021 10:40 AM Arfeen, Arlyce Harman, MD [facility] None  01/06/2022       Wellness 11:00 AM Liane Comber, NP [facility] None  09/30/2022      CPE  3:00 PM Unk Pinto, MD [facility] None        This very nice 65 y.o. MWF presents for a Screening /Preventative Visit & comprehensive evaluation and management of multiple medical co-morbidities.  Patient has been followed for HTN, HLD, GERD, Migraines, Hypothyroidism, Prediabetes  and Vitamin D Deficiency. Patient also has hx/COPD which is controlled on maintenance Advair with infrequent use of rescue inhalers.    [[COPIED: Patient  is on SS Disability (2016) for a Chronic Pain Syndrome attributed to Cx DDD / Cx HNP  (1st Cx surgery in 2011 and a 2sd surgery  L5/S1 in 2013 at  by Dr Ellene Route and 3rd surgery- Lumbar Fusion in Nov 2016).  She reports chronic intermittent RLE pain. ]]        Patient is followed by Dr Berniece Andreas  & Shade Flood , LCSW, LCAS  for  a major Depressive Disorder  stable on Lamictal, Duloxetine, Clonazepam & Amitriptyline.       HTN predates since  2007.  Patient's BP has been controlled  on  Losartan at home and patient denies any cardiac symptoms as chest pain, palpitations, shortness of breath, dizziness or ankle swelling. Today's BP is at goal -  140/76.        Patient's hyperlipidemia is not  controlled with diet and Rosuvastatin. Patient denies myalgias or other medication SE's. Last lipids were not at goal:  Lab Results  Component Value Date   CHOL 182 04/16/2021   HDL 43 (L) 04/16/2021   LDLCALC 112 (H) 04/16/2021   TRIG 157 (H) 04/16/2021   CHOLHDL 4.2 04/16/2021         Patient has hx/o prediabetes (A1c 5.9% /2009 & 6.3% /2014) and patient denies reactive  hypoglycemic symptoms, visual blurring, diabetic polys or paresthesias. Last A1c was not at goal:  Lab Results  Component Value Date   HGBA1C 5.7 (H) 04/16/2021                                             Patient was dx'd Hypothyroid in 2009  and begun on thyroid replacement.       Finally, patient has history of Vitamin D Deficiency ("19" /2009) and last Vitamin D was at goal:  Lab Results  Component Value Date   VD25OH 94 04/16/2021     Current Outpatient Medications on File Prior to Visit  Medication Sig   albuterol (PROAIR HFA) 108 (90 Base) MCG/ACT inhaler Inhale 2 puffs into the lungs every 6 (six) hours as needed for wheezing or shortness of breath.   amitriptyline (ELAVIL) 25 MG tablet Take 1 tablet (25 mg total) by mouth at bedtime.   cetirizine (ZYRTEC) 10 MG tablet TAKE 1 TABLET(10 MG) BY MOUTH DAILY   Cholecalciferol (VITAMIN D PO) Take 4,000 Units by mouth daily.    CINNAMON PO Take 1,000 Units by mouth 2 (two) times daily.    clonazePAM (KLONOPIN) 0.5 MG tablet Take 1/2 to  1 tablet as needed for severe anxiety.   diclofenac sodium (VOLTAREN) 1 % GEL Apply 4 g topically 4 (four) times daily.   DULoxetine HCl 40 MG CPEP Take 40 mg by mouth daily.   fluticasone (FLONASE) 50 MCG/ACT nasal spray INSTILL 2 SPRAYS IN EACH NOSTRIL EVERY EVENING   fluticasone-salmeterol (ADVAIR HFA) 115-21 MCG/ACT inhaler INHALE 2 PUFFS BY MOUTH EVERY 12 HOURS 10 TO 15 MINUTES APART. Rinse mouth and gargle after using to avoid thrush.   halobetasol (ULTRAVATE) 0.05 % ointment    lamoTRIgine (LAMICTAL) 25 MG tablet Take 1 tablet (25 mg total) by mouth 2 (two) times daily.   levothyroxine (SYNTHROID) 50 MCG tablet Take 1/2 tablet  Daily  on an empty stomach with only water for 30 minutes & no Antacid meds, Calcium or Magnesium for 4 hours & avoid Biotin   losartan (COZAAR) 50 MG tablet TAKE 1 TABLET BY MOUTH DAILY FOR BLOOD PRESSURE   Magnesium 250 MG TABS Take 3 tablets by mouth daily.     meloxicam (MOBIC) 15 MG tablet Take  1 tablet   Daily  with Food for Pain & Inflammation   montelukast (SINGULAIR) 10 MG tablet Take  1 tablet  Daily  for Allergies   nystatin (MYCOSTATIN) 100000 UNIT/ML suspension SWISH AND SWALLOW 5 ML BY MOUTH FOUR TIMES DAILY FOR THRUSH OR YEAST INFECTION   ondansetron (ZOFRAN) 4 MG tablet Take 1 tablet (4 mg total) by mouth every 8 (eight) hours as needed.   pantoprazole (PROTONIX) 40 MG tablet Take  1 tablet  Daily  to Prevent Heartburn & Indigestion         /TAKE 1 TABLET BY MOUTH EVERY DAY   pregabalin (LYRICA) 50 MG capsule Take 50 mg by mouth at bedtime.   PREMARIN vaginal cream INSERT 1/2 GRAM VAGINALLY VIA APPLICATOR NIGHTLY FOR W EEKS THEN JUST DO TEICE A WEEK   Pseudoephedrine HCl 240 MG TB24 Take 1 tablet every Morning for Head & Ear congestion   Rimegepant Sulfate (NURTEC) 75 MG TBDP Take 75 mg by mouth as needed (migraine).   rosuvastatin (CRESTOR) 40 MG tablet Take  1 tablet  2 x /week for Cholesterol   Spacer/Aero-Holding Chambers (AEROCHAMBER MV) inhaler Use as instructed with inhaler.    Allergies  Allergen Reactions   Demerol [Meperidine] Other (See Comments)    REACTION: unknown--itching   Epinephrine Other (See Comments)    "Sensitivity."   Niacin And Related Rash     Past Medical History:  Diagnosis Date   Asthma    Dulera daily   Chronic back pain    HNP   Complication of anesthesia    slow to wake up   Depression    takes Clonazepam nightly   GERD (gastroesophageal reflux disease)    takes Protonix daily    Heart murmur    History of migraine    takes Relpax daily as needed   HSV (herpes simplex virus) infection    Hyperlipidemia    takes Atorvastatin daily   Hypertension    takes Benicar daily   Hypothyroidism    takes Synthroid daily   MVP (mitral valve prolapse)    Pneumonia    hx of--been many yrs ago   Seasonal allergies    takes Allegra daily   Shortness of breath    with exertion     Health  Maintenance  Topic Date Due   Pneumococcal Vaccine 87-17 Years old (1 - PCV) [birth year]/1963   Zoster Vaccines- Shingrix (  1 of 2) Never done   COVID-19 Vaccine (4 - Booster for Moderna series) 01/05/2021   INFLUENZA VACCINE  06/29/2021   MAMMOGRAM  03/02/2022   TETANUS/TDAP  05/05/2026   COLONOSCOPY (Pts 45-78yrs Insurance coverage will need to be confirmed)  02/26/2031   Hepatitis C Screening  Completed   HIV Screening  Completed   HPV VACCINES  Aged Out     Immunization History  Administered Date(s) Administered   Influenza Inj Mdck Quad  08/27/2019   Influenza Inj Mdck Quad  09/14/2017, 10/19/2018, 09/24/2020   Influenza,inj,quad 11/09/2016   Influenza 09/18/2013   Moderna Sars-Covid-2 Vacc 02/15/2020, 03/18/2020, 11/10/2020   PPD Test 04/17/2014   Pneumococcal-23 11/30/1995   Td 08/27/2005, 05/05/2016     Last Colon - 02/25/2021 -  Dr Cristina Gong - Recc 10 year f/u - due Apr 2032           EGD - 02/25/2021 - Dr Cristina Gong - WNL  Last MGM -    Past Surgical History:  Procedure Laterality Date   ABLATION  2011   Uterine   buttocks surgery     at age 61 (coccyx repair)   CERVICAL FUSION  03/2010   CESAREAN SECTION  1992   CHOLECYSTECTOMY  2008   COLONOSCOPY     DILATION AND CURETTAGE OF UTERUS  2011   ESOPHAGOGASTRODUODENOSCOPY     LUMBAR LAMINECTOMY/DECOMPRESSION MICRODISCECTOMY  10/24/2012   Procedure: LUMBAR LAMINECTOMY/DECOMPRESSION MICRODISCECTOMY 1 LEVEL;  Surgeon: Kristeen Miss, MD;  Location: [facility];  Service: Neurosurgery;  Laterality: Right;  Right Lumbar five-Sacral One Microdiskectomy   right knee arthroscopy     x 3   TONSILLECTOMY     at age 89     Family History  Problem Relation Age of Onset   Arthritis Mother    Heart attack Mother    Depression Mother    Allergies Mother    Hypertension Mother    Heart attack Father      Social History   Tobacco Use   Smoking status: Former   Smokeless tobacco: Never   Tobacco comments:    quit at age  81  Vaping Use   Vaping Use: Never used  Substance Use Topics   Alcohol use: No    Comment: 2-3 times a yr   Drug use: No      ROS Constitutional: Denies fever, chills, weight loss/gain, headaches, insomnia,  night sweats, and change in appetite. Does c/o fatigue. Eyes: Denies redness, blurred vision, diplopia, discharge, itchy, watery eyes.  ENT: Denies discharge, congestion, post nasal drip, epistaxis, sore throat, earache, hearing loss, dental pain, Tinnitus, Vertigo, Sinus pain, snoring.  Cardio: Denies chest pain, palpitations, irregular heartbeat, syncope, dyspnea, diaphoresis, orthopnea, PND, claudication, edema Respiratory: denies cough, dyspnea, DOE, pleurisy, hoarseness, laryngitis, wheezing.  Gastrointestinal: Denies dysphagia, heartburn, reflux, water brash, pain, cramps, nausea, vomiting, bloating, diarrhea, constipation, hematemesis, melena, hematochezia, jaundice, hemorrhoids Genitourinary: Denies dysuria, frequency, urgency, nocturia, hesitancy, discharge, hematuria, flank pain Breast: Breast lumps, nipple discharge, bleeding.  Musculoskeletal: Denies arthralgia, myalgia, stiffness, Jt. Swelling, pain, limp, and strain/sprain. Denies falls. Skin: Denies puritis, rash, hives, warts, acne, eczema, changing in skin lesion Neuro: No weakness, tremor, incoordination, spasms, paresthesia, pain Psychiatric: Denies confusion, memory loss, sensory loss. Denies Depression. Endocrine: Denies change in weight, skin, hair change, nocturia, and paresthesia, diabetic polys, visual blurring, hyper / hypo glycemic episodes.  Heme/Lymph: No excessive bleeding, bruising, enlarged lymph nodes.  Physical Exam  BP 140/76   Pulse 78   Temp  97.9 F (36.6 C)   Resp 16   Ht 5' 1.5" (1.562 m)   Wt 118 lb 12.8 oz (53.9 kg)   SpO2 97%   BMI 22.08 kg/m   General Appearance: Well nourished, well groomed and in no apparent distress.  Eyes: PERRLA, EOMs, conjunctiva no swelling or erythema,  normal fundi and vessels. Sinuses: No frontal/maxillary tenderness ENT/Mouth: EACs patent / TMs  nl. Nares clear without erythema, swelling, mucoid exudates. Oral hygiene is good. No erythema, swelling, or exudate. Tongue normal, non-obstructing. Tonsils not swollen or erythematous. Hearing normal.  Neck: Supple, thyroid not palpable. No bruits, nodes or JVD. Respiratory: Respiratory effort normal.  BS equal and clear bilateral without rales, rhonci, wheezing or stridor. Cardio: Heart sounds are normal with regular rate and rhythm and no murmurs, rubs or gallops. Peripheral pulses are normal and equal bilaterally without edema. No aortic or femoral bruits. Chest: symmetric with normal excursions and percussion. Breasts: Symmetric, without lumps, nipple discharge, retractions, or fibrocystic changes.  Abdomen: Flat, soft with bowel sounds active. Nontender, no guarding, rebound, hernias, masses, or organomegaly.  Lymphatics: Non tender without lymphadenopathy.  Genitourinary:  Musculoskeletal: Full ROM all peripheral extremities, joint stability, 5/5 strength, and normal gait. Skin: Warm and dry without rashes, lesions, cyanosis, clubbing or  ecchymosis.  Neuro: Cranial nerves intact, reflexes equal bilaterally. Normal muscle tone, no cerebellar symptoms. Sensation intact.  Pysch: Alert and oriented X 3, normal affect, Insight and Judgment appropriate.    Assessment and Plan  1. Annual Preventative Screening Examination   2. Essential hypertension  - EKG 12-Lead - Urinalysis, Routine w reflex microscopic - Microalbumin / creatinine urine ratio - CBC with Differential/Platelet - COMPLETE METABOLIC PANEL WITH GFR - Magnesium - TSH  3. Hyperlipidemia, mixed  - EKG 12-Lead - Lipid panel - TSH  4. Abnormal glucose  - EKG 12-Lead - Hemoglobin A1c - Insulin, random  5. Vitamin D deficiency  - VITAMIN D 25 Hydroxy   6. Hypothyroidism, unspecified type  - TSH  7.  Gastroesophageal reflux disease  - CBC with Differential/Platelet  8. Migraine with aura and without status migrainosus, not intractable   9. Depression, major, recurrent, in partial remission (HCC)  - TSH  10. Chronic back pain, unspecified back location, unspecified back pain laterality   11. Screening for colorectal cancer  - POC Hemoccult Bld/Stl (3-Cd Home Screen); Future  12. Screening for ischemic heart disease  - EKG 12-Lead  13. FHx: heart disease  - EKG 12-Lead  14. Former smoker  - EKG 12-Lead  15. Medication management  - Urinalysis, Routine w reflex microscopic - Microalbumin / creatinine urine ratio - CBC with Differential/Platelet - COMPLETE METABOLIC PANEL WITH GFR - Magnesium - Lipid panel - TSH - Hemoglobin A1c - Insulin, random - VITAMIN D 25 Hydroxy          Patient was counseled in prudent diet to achieve/maintain BMI less than 25 for weight control, BP monitoring, regular exercise and medications. Discussed med's effects and SE's. Screening labs and tests as requested with regular follow-up as recommended. Over 40 minutes of exam, counseling, chart review and high complex critical decision making was performed.   Kirtland Bouchard, MD

## 2021-10-01 LAB — HEMOGLOBIN A1C
Hgb A1c MFr Bld: 5.6 % of total Hgb (ref <5.7)
Mean Plasma Glucose: 114 mg/dL
eAG (mmol/L): 6.3 mmol/L

## 2021-10-01 LAB — CBC WITH DIFFERENTIAL/PLATELET
Absolute Monocytes: 462 cells/uL (ref 200–950)
Basophils Absolute: 40 cells/uL (ref 0–200)
Basophils Relative: 0.6 %
Eosinophils Absolute: 168 cells/uL (ref 15–500)
Eosinophils Relative: 2.5 %
HCT: 42.1 % (ref 35.0–45.0)
Hemoglobin: 14.3 g/dL (ref 11.7–15.5)
Lymphs Abs: 1501 cells/uL (ref 850–3900)
MCH: 31.5 pg (ref 27.0–33.0)
MCHC: 34 g/dL (ref 32.0–36.0)
MCV: 92.7 fL (ref 80.0–100.0)
MPV: 10.2 fL (ref 7.5–12.5)
Monocytes Relative: 6.9 %
Neutro Abs: 4529 cells/uL (ref 1500–7800)
Neutrophils Relative %: 67.6 %
Platelets: 270 10*3/uL (ref 140–400)
RBC: 4.54 10*6/uL (ref 3.80–5.10)
RDW: 11.8 % (ref 11.0–15.0)
Total Lymphocyte: 22.4 %
WBC: 6.7 10*3/uL (ref 3.8–10.8)

## 2021-10-01 LAB — INSULIN, RANDOM: Insulin: 24.4 u[IU]/mL — ABNORMAL HIGH

## 2021-10-01 LAB — URINALYSIS, ROUTINE W REFLEX MICROSCOPIC
Bilirubin Urine: NEGATIVE
Glucose, UA: NEGATIVE
Hgb urine dipstick: NEGATIVE
Ketones, ur: NEGATIVE
Leukocytes,Ua: NEGATIVE
Nitrite: NEGATIVE
Protein, ur: NEGATIVE
Specific Gravity, Urine: 1.008 (ref 1.001–1.035)
pH: 7 (ref 5.0–8.0)

## 2021-10-01 LAB — LIPID PANEL
Cholesterol: 144 mg/dL (ref <200)
HDL: 36 mg/dL — ABNORMAL LOW (ref >=50)
LDL Cholesterol (Calc): 78 mg/dL (calc)
Non-HDL Cholesterol (Calc): 108 mg/dL (calc) (ref <130)
Total CHOL/HDL Ratio: 4 (calc) (ref <5.0)
Triglycerides: 206 mg/dL — ABNORMAL HIGH (ref <150)

## 2021-10-01 LAB — COMPLETE METABOLIC PANEL WITH GFR
AG Ratio: 2.3 (calc) (ref 1.0–2.5)
ALT: 19 U/L (ref 6–29)
AST: 20 U/L (ref 10–35)
Albumin: 4.6 g/dL (ref 3.6–5.1)
Alkaline phosphatase (APISO): 66 U/L (ref 37–153)
BUN: 17 mg/dL (ref 7–25)
CO2: 30 mmol/L (ref 20–32)
Calcium: 9.7 mg/dL (ref 8.6–10.4)
Chloride: 103 mmol/L (ref 98–110)
Creat: 0.96 mg/dL (ref 0.50–1.05)
Globulin: 2 g/dL (calc) (ref 1.9–3.7)
Glucose, Bld: 111 mg/dL — ABNORMAL HIGH (ref 65–99)
Potassium: 4.9 mmol/L (ref 3.5–5.3)
Sodium: 142 mmol/L (ref 135–146)
Total Bilirubin: 0.5 mg/dL (ref 0.2–1.2)
Total Protein: 6.6 g/dL (ref 6.1–8.1)
eGFR: 66 mL/min/{1.73_m2} (ref >=60)

## 2021-10-01 LAB — TSH: TSH: 1.27 mIU/L (ref 0.40–4.50)

## 2021-10-01 LAB — MICROALBUMIN / CREATININE URINE RATIO
Creatinine, Urine: 31 mg/dL (ref 20–275)
Microalb, Ur: <0.2 mg/dL

## 2021-10-01 LAB — VITAMIN D 25 HYDROXY (VIT D DEFICIENCY, FRACTURES): Vit D, 25-Hydroxy: 103 ng/mL — ABNORMAL HIGH (ref 30–100)

## 2021-10-01 LAB — MAGNESIUM: Magnesium: 2.1 mg/dL (ref 1.5–2.5)

## 2021-10-02 ENCOUNTER — Other Ambulatory Visit: Payer: Self-pay

## 2021-10-02 DIAGNOSIS — J301 Allergic rhinitis due to pollen: Secondary | ICD-10-CM

## 2021-10-02 MED ORDER — CETIRIZINE HCL 10 MG PO TABS: ORAL_TABLET | ORAL | 2 refills | Status: DC

## 2021-10-02 MED ORDER — LEVOTHYROXINE SODIUM 50 MCG PO TABS: ORAL_TABLET | ORAL | 1 refills | Status: DC

## 2021-10-02 NOTE — Progress Notes (Signed)
============================================================ -   Test results slightly outside the reference range are not unusual. If there is anything important, I will review this with you,  otherwise it is considered normal test values.  If you have further questions,  please do not hesitate to contact me at the office or via My Chart.  ============================================================ ============================================================  -   -  Total  Chol =    144     -             (  Ideal  or  Goal is less than 180  !  )   - and   -  Bad / Dangerous LDL  Chol =    78    -              (  Ideal  or  Goal is less than 70  !  )   - Both  Excellent   - Very low risk for Heart Attack  / Stroke ============================================================ ============================================================  -  But . . . . . . . . . . . . .  Triglycerides (   206   ) or fats in blood are too high  (goal is less than 150)    - Recommend avoid fried & greasy foods,  sweets / candy,   - Avoid white rice  (brown or wild rice or Quinoa is OK),   - Avoid white potatoes  (sweet potatoes are OK)   - Avoid anything made from white flour  - bagels, doughnuts, rolls, buns, biscuits, white and   wheat breads, pizza crust and traditional  pasta made of white flour & egg white  - (vegetarian pasta or spinach or wheat pasta is OK).    - Multi-grain bread is OK - like multi-grain flat bread or  sandwich thins.   - Avoid alcohol in excess.   - Exercise is also important. ============================================================ ============================================================  - A1c = 5.6% FINALLY back in Normal Range  ============================================================ ============================================================  - Vitamin D = 103  - Excellent  - Please continue dose  same) ============================================================ ============================================================  - All Else - CBC - Kidneys - Electrolytes - Liver - Magnesium & Thyroid    - all  Normal / OK ============================================================ ============================================================  - Keep up the [place] Work  !  ============================================================ ============================================================

## 2021-10-06 ENCOUNTER — Other Ambulatory Visit: Payer: Self-pay

## 2021-10-06 ENCOUNTER — Ambulatory Visit (INDEPENDENT_AMBULATORY_CARE_PROVIDER_SITE_OTHER): Payer: PPO

## 2021-10-06 VITALS — Temp 97.7°F

## 2021-10-06 DIAGNOSIS — Z23 Encounter for immunization: Secondary | ICD-10-CM

## 2021-10-06 NOTE — Progress Notes (Signed)
Patient presents today for a flu shot.  

## 2021-10-08 ENCOUNTER — Ambulatory Visit (HOSPITAL_COMMUNITY): Payer: PPO | Admitting: Licensed Clinical Social Worker

## 2021-10-14 DIAGNOSIS — I10 Essential (primary) hypertension: Secondary | ICD-10-CM | POA: Diagnosis not present

## 2021-10-14 DIAGNOSIS — M4317 Spondylolisthesis, lumbosacral region: Secondary | ICD-10-CM | POA: Diagnosis not present

## 2021-10-15 ENCOUNTER — Ambulatory Visit (HOSPITAL_COMMUNITY): Payer: PPO | Admitting: Licensed Clinical Social Worker

## 2021-10-29 ENCOUNTER — Encounter: Payer: Self-pay | Admitting: Internal Medicine

## 2021-10-29 ENCOUNTER — Other Ambulatory Visit: Payer: Self-pay | Admitting: Internal Medicine

## 2021-10-29 MED ORDER — MIRABEGRON ER 50 MG PO TB24: ORAL_TABLET | ORAL | 3 refills | Status: DC

## 2021-11-03 ENCOUNTER — Ambulatory Visit (INDEPENDENT_AMBULATORY_CARE_PROVIDER_SITE_OTHER): Payer: PPO | Admitting: Licensed Clinical Social Worker

## 2021-11-03 ENCOUNTER — Other Ambulatory Visit: Payer: Self-pay

## 2021-11-03 DIAGNOSIS — F411 Generalized anxiety disorder: Secondary | ICD-10-CM

## 2021-11-03 DIAGNOSIS — F330 Major depressive disorder, recurrent, mild: Secondary | ICD-10-CM | POA: Diagnosis not present

## 2021-11-03 NOTE — Progress Notes (Signed)
THERAPIST PROGRESS NOTE   Session Time: 3:00pm - 4:00pm    Location: Patient: [facility] [place] [facility] Provider: [facility] [place] [facility]   Participation Level: Active    Behavioral Response: Alert, casually dressed, depressed mood/affect    Type of Therapy:  Individual Therapy   Treatment Goals addressed: Depression and anxiety management; Medication compliance   Interventions: CBT: core beliefs and challenging irrational thoughts       Summary: Lorenia Hoston is a 65 year old married Caucasian female on disability that presented for therapy appointment today with diagnoses of Major depressive disorder, recurrent, mild; and Generalized Anxiety Disorder.        Suicidal/Homicidal: None; without intent or plan.     Therapist Response:  Clinician met with Anju for in person therapy appointment today, and assessed for safety, medication compliance, and sobriety.  Raigan presented for session on time, and was alert, oriented x5, with no evidence or self-report of active SI/HI or A/V H.  Eliot reported that she continues taking medication as prescribed and denied use of alcohol or illicit substances. Clinician inquired about Jaleah's current emotional ratings, as well as any significant changes in thoughts, feelings, or behavior since last check-in.  Nastacia reported scores of 5/10 for depression, 1/10 for anxiety, and 2/10 for irritability.  She denied any recent panic attacks or outbursts.  Krysia reported that a recent success has been spending more time socializing to cut back on isolation, including going out to dinners with friends and family.  Milan reported that she has noticed her daughter reconnecting more and this has reinforced changes she is making in boundaries and communication.  Kenika reported that at times she will still experience irrational thoughts related to previously noted negative core beliefs, and this can impact her motivation to follow through with socialization goals.  Clinician explored this  issue with Jinan by reviewing material on core beliefs, and how they in turn affect her feelings and behavior day to day.  Clinician assisted Ladonya in challenging irrational thoughts by identifying evidence that contradicts them in order to improve mood and behavior.  Clinician also explored additional social outlets with Niquita that she could include in self-care routine during upcoming weeks in order to continue reducing isolation.  Interventions were effective, as evidenced by Manuela Schwartz actively engaging in discussion on subject, reporting that she often thinks critically of herself and would benefit from implementing these tools to change outlook, stating "I'm talking myself into anxiety and beat myself up over stuff I can't change.  This session reaffirmed that making changes is good for me, and when I start thinking negatively I need to flip it around".  Lia reported motivation to continue taking opportunities to expand network gradually as well.  Clinician will continue to monitor.      Plan: Follow up for next session in 2 weeks.   Diagnosis: Major depressive disorder, recurrent, mild; and Generalized Anxiety Disorder.   Shade Flood, LCSW, LCAS 11/03/21

## 2021-11-05 ENCOUNTER — Telehealth (HOSPITAL_BASED_OUTPATIENT_CLINIC_OR_DEPARTMENT_OTHER): Payer: PPO | Admitting: Psychiatry

## 2021-11-05 ENCOUNTER — Encounter (HOSPITAL_COMMUNITY): Payer: Self-pay | Admitting: Psychiatry

## 2021-11-05 ENCOUNTER — Other Ambulatory Visit: Payer: Self-pay

## 2021-11-05 DIAGNOSIS — F411 Generalized anxiety disorder: Secondary | ICD-10-CM

## 2021-11-05 DIAGNOSIS — F331 Major depressive disorder, recurrent, moderate: Secondary | ICD-10-CM | POA: Diagnosis not present

## 2021-11-05 MED ORDER — AMITRIPTYLINE HCL 25 MG PO TABS: 25.0000 mg | ORAL_TABLET | Freq: Every day | ORAL | 0 refills | Status: DC

## 2021-11-05 MED ORDER — DULOXETINE HCL 40 MG PO CPEP: 40.0000 mg | ORAL_CAPSULE | Freq: Every day | ORAL | 0 refills | Status: DC

## 2021-11-05 MED ORDER — LAMOTRIGINE 25 MG PO TABS: 25.0000 mg | ORAL_TABLET | Freq: Two times a day (BID) | ORAL | 2 refills | Status: DC

## 2021-11-05 NOTE — Progress Notes (Signed)
Virtual Visit via Telephone Note  I connected with Sarah Rodriguez on 11/05/21 at 10:40 AM EST by telephone and verified that I am speaking with the correct person using two identifiers.  Location: Patient: Home Provider: Home Office   I discussed the limitations, risks, security and privacy concerns of performing an evaluation and management service by telephone and the availability of in person appointments. I also discussed with the patient that there may be a patient responsible charge related to this service. The patient expressed understanding and agreed to proceed.   History of Present Illness: Patient is evaluated by phone session.  She started therapy with Georgina Snell more regularly and noticed much improvement in her mood and anxiety.  She is also taking medication on time.  She had a good Thanksgiving and hoping to had a good Christmas as going to [place] with her husband.  She is sleeping good.  Since she started taking medication for UTI she noticed does not have to go to the bathroom frequently and she is sleeping all night.  Her energy level is good.  Lately she noticed more hopeful and denies any crying spells, anhedonia.  She has no rash or any itching.  She still takes Klonopin one fourth if needed for anxiety especially when she is traveling.  Her relationship with the daughter is much better from the past.  Past Psychiatric History:  H/O depression and taking antidepressants since 1996.  H/O panic attacks while driving.  Saw Dr. Letta Moynahan and tried Prozac, Paxil, Zoloft, Xanax, Cymbalta and Klonopin.  No h/o inpatient treatment or any suicidal attempt.  Recent Results (from the past 2160 hour(s))  Urinalysis, Routine w reflex microscopic     Status: None   Collection Time: 09/30/21  3:03 PM  Result Value Ref Range   Color, Urine YELLOW YELLOW   APPearance CLEAR CLEAR   Specific Gravity, Urine 1.008 1.001 - 1.035   pH 7.0 5.0 - 8.0   Glucose, UA NEGATIVE NEGATIVE    Bilirubin Urine NEGATIVE NEGATIVE   Ketones, ur NEGATIVE NEGATIVE   Hgb urine dipstick NEGATIVE NEGATIVE   Protein, ur NEGATIVE NEGATIVE   Nitrite NEGATIVE NEGATIVE   Leukocytes,Ua NEGATIVE NEGATIVE  Microalbumin / creatinine urine ratio     Status: None   Collection Time: 09/30/21  3:03 PM  Result Value Ref Range   Creatinine, Urine 31 20 - 275 mg/dL   Microalb, Ur <0.2 mg/dL    Comment: Reference Range Not established    Microalb Creat Ratio NOTE <30 mcg/mg creat    Comment: NOTE: The urine albumin value is less than  0.2 mg/dL therefore we are unable to calculate  excretion and/or creatinine ratio. . The ADA defines abnormalities in albumin excretion as follows: Marland Kitchen Albuminuria Category        Result (mcg/mg creatinine) . Normal to Mildly increased   <30 Moderately increased         30-299  Severely increased           > OR = 300 . The ADA recommends that at least two of three specimens collected within a 3-6 month period be abnormal before considering a patient to be within a diagnostic category.   CBC with Differential/Platelet     Status: None   Collection Time: 09/30/21  3:03 PM  Result Value Ref Range   WBC 6.7 3.8 - 10.8 Thousand/uL   RBC 4.54 3.80 - 5.10 Million/uL   Hemoglobin 14.3 11.7 - 15.5 g/dL   HCT  42.1 35.0 - 45.0 %   MCV 92.7 80.0 - 100.0 fL   MCH 31.5 27.0 - 33.0 pg   MCHC 34.0 32.0 - 36.0 g/dL   RDW 11.8 11.0 - 15.0 %   Platelets 270 140 - 400 Thousand/uL   MPV 10.2 7.5 - 12.5 fL   Neutro Abs 4,529 1,500 - 7,800 cells/uL   Lymphs Abs 1,501 850 - 3,900 cells/uL   Absolute Monocytes 462 200 - 950 cells/uL   Eosinophils Absolute 168 15 - 500 cells/uL   Basophils Absolute 40 0 - 200 cells/uL   Neutrophils Relative % 67.6 %   Total Lymphocyte 22.4 %   Monocytes Relative 6.9 %   Eosinophils Relative 2.5 %   Basophils Relative 0.6 %  COMPLETE METABOLIC PANEL WITH GFR     Status: Abnormal   Collection Time: 09/30/21  3:03 PM  Result Value Ref Range    Glucose, Bld 111 (H) 65 - 99 mg/dL    Comment: .            Fasting reference interval . For someone without known diabetes, a glucose value between 100 and 125 mg/dL is consistent with prediabetes and should be confirmed with a follow-up test. .    BUN 17 7 - 25 mg/dL   Creat 0.96 0.50 - 1.05 mg/dL   eGFR 66 > OR = 60 mL/min/1.69m    Comment: The eGFR is based on the CKD-EPI 2021 equation. To calculate  the new eGFR from a previous Creatinine or Cystatin C result, go to https://www.kidney.org/professionals/ kdoqi/gfr%5Fcalculator    BUN/Creatinine Ratio NOT APPLICABLE 6 - 22 (calc)   Sodium 142 135 - 146 mmol/L   Potassium 4.9 3.5 - 5.3 mmol/L   Chloride 103 98 - 110 mmol/L   CO2 30 20 - 32 mmol/L   Calcium 9.7 8.6 - 10.4 mg/dL   Total Protein 6.6 6.1 - 8.1 g/dL   Albumin 4.6 3.6 - 5.1 g/dL   Globulin 2.0 1.9 - 3.7 g/dL (calc)   AG Ratio 2.3 1.0 - 2.5 (calc)   Total Bilirubin 0.5 0.2 - 1.2 mg/dL   Alkaline phosphatase (APISO) 66 37 - 153 U/L   AST 20 10 - 35 U/L   ALT 19 6 - 29 U/L  Magnesium     Status: None   Collection Time: 09/30/21  3:03 PM  Result Value Ref Range   Magnesium 2.1 1.5 - 2.5 mg/dL  Lipid panel     Status: Abnormal   Collection Time: 09/30/21  3:03 PM  Result Value Ref Range   Cholesterol 144 <200 mg/dL   HDL 36 (L) > OR = 50 mg/dL   Triglycerides 206 (H) <150 mg/dL    Comment: . If a non-fasting specimen was collected, consider repeat triglyceride testing on a fasting specimen if clinically indicated.  JYates Decampet al. J. of Clin. Lipidol. [id number];5:397-673 .Marland Kitchen   LDL Cholesterol (Calc) 78 mg/dL (calc)    Comment: Reference range: <100 . Desirable range <100 mg/dL for primary prevention;   <70 mg/dL for patients with CHD or diabetic patients  with > or = 2 CHD risk factors. .Marland KitchenLDL-C is now calculated using the Martin-Hopkins  calculation, which is a validated novel method providing  better accuracy than the Friedewald equation in the   estimation of LDL-C.  MCresenciano Genreet al. JAnnamaria Helling 24193;790(24: 2061-2068  (http://education.[facility].com/faq/FAQ164)    Total CHOL/HDL Ratio 4.0 <5.0 (calc)   Non-HDL Cholesterol (Calc) 108 <130 mg/dL (calc)  Comment: For patients with diabetes plus 1 major ASCVD risk  factor, treating to a non-HDL-C goal of <100 mg/dL  (LDL-C of <70 mg/dL) is considered a therapeutic  option.   TSH     Status: None   Collection Time: 09/30/21  3:03 PM  Result Value Ref Range   TSH 1.27 0.40 - 4.50 mIU/L  Hemoglobin A1c     Status: None   Collection Time: 09/30/21  3:03 PM  Result Value Ref Range   Hgb A1c MFr Bld 5.6 <5.7 % of total Hgb    Comment: For the purpose of screening for the presence of diabetes: . <5.7%       Consistent with the absence of diabetes 5.7-6.4%    Consistent with increased risk for diabetes             (prediabetes) > or =6.5%  Consistent with diabetes . This assay result is consistent with a decreased risk of diabetes. . Currently, no consensus exists regarding use of hemoglobin A1c for diagnosis of diabetes in children. . According to American Diabetes Association (ADA) guidelines, hemoglobin A1c <7.0% represents optimal control in non-pregnant diabetic patients. Different metrics may apply to specific patient populations.  Standards of Medical Care in Diabetes(ADA). .    Mean Plasma Glucose 114 mg/dL   eAG (mmol/L) 6.3 mmol/L  Insulin, random     Status: Abnormal   Collection Time: 09/30/21  3:03 PM  Result Value Ref Range   Insulin 24.4 (H) uIU/mL    Comment:      Reference Range  < or = 19.6 .      Risk:      Optimal          < or = 19.6      Moderate         NA      High             >19.6 .      Adult cardiovascular event risk category      cut points (optimal, moderate, high)      are based on [employer] population      data from 10/2010. . This insulin assay shows strong cross-reactivity for some insulin analogs (lispro,  aspart, and glargine) and much lower cross-reactivity with others (detemir, glulisine).   VITAMIN D 25 Hydroxy (Vit-D Deficiency, Fractures)     Status: Abnormal   Collection Time: 09/30/21  3:03 PM  Result Value Ref Range   Vit D, 25-Hydroxy 103 (H) 30 - 100 ng/mL    Comment: Vitamin D Status         25-OH Vitamin D: . Deficiency:                    <20 ng/mL Insufficiency:             20 - 29 ng/mL Optimal:                 > or = 30 ng/mL . For 25-OH Vitamin D testing on patients on  D2-supplementation and patients for whom quantitation  of D2 and D3 fractions is required, the [facility]ureD(TM) 25-OH VIT D, (D2,D3), LC/MS/MS is recommended: order  code [phone] (patients >24yr). See Note 1 . Note 1 . For additional information, please refer to  http://education.[facility].com/faq/FAQ199  (This link is being provided for informational/ educational purposes only.)     Psychiatric Specialty Exam: Physical Exam  Review of Systems  Weight 118 lb (53.5 kg).There  is no height or weight on file to calculate BMI.  General Appearance: NA  Eye Contact:  NA  Speech:  Normal Rate  Volume:  Normal  Mood:  Euthymic  Affect:  NA  Thought Process:  Goal Directed  Orientation:  Full (Time, Place, and Person)  Thought Content:  Logical  Suicidal Thoughts:  No  Homicidal Thoughts:  No  Memory:  Immediate;   Good Recent;   Good Remote;   Good  Judgement:  Good  Insight:  Present  Psychomotor Activity:  NA  Concentration:  Concentration: Good and Attention Span: Good  Recall:  Good  Fund of Knowledge:  Good  Language:  Good  Akathisia:  No  Handed:  Right  AIMS (if indicated):     Assets:  Communication Skills Desire for Improvement Housing Resilience Social Support Transportation  ADL's:  Intact  Cognition:  WNL  Sleep:   good      Assessment and Plan: Major depressive disorder, recurrent with anxiety.  Since taking the medication regularly and seeing Georgina Snell she  noticed her depression anxiety is better.  She has not taken Klonopin as frequently but rarely takes one fourth.  Recommended to keep the therapy with [name].  Continue Lamictal 25 mg twice a day, amitriptyline 25 mg at bedtime, Cymbalta 40 mg daily.  She does not need a new prescription of Klonopin at this time but agreed to give [place] a call if needed repeat in the future.  Follow-up in 3 months.  Follow Up Instructions:    I discussed the assessment and treatment plan with the patient. The patient was provided an opportunity to ask questions and all were answered. The patient agreed with the plan and demonstrated an understanding of the instructions.   The patient was advised to call back or seek an in-person evaluation if the symptoms worsen or if the condition fails to improve as anticipated.  I provided 18 minutes of non-face-to-face time during this encounter.   Kathlee Nations, MD

## 2021-11-11 ENCOUNTER — Telehealth (HOSPITAL_COMMUNITY): Payer: PPO | Admitting: Psychiatry

## 2021-11-17 ENCOUNTER — Other Ambulatory Visit: Payer: Self-pay | Admitting: Adult Health Nurse Practitioner

## 2021-11-17 DIAGNOSIS — B3789 Other sites of candidiasis: Secondary | ICD-10-CM

## 2021-11-25 ENCOUNTER — Ambulatory Visit (HOSPITAL_COMMUNITY): Payer: PPO | Admitting: Licensed Clinical Social Worker

## 2021-12-01 ENCOUNTER — Telehealth (HOSPITAL_COMMUNITY): Payer: PPO | Admitting: Psychiatry

## 2021-12-10 ENCOUNTER — Encounter: Payer: Self-pay | Admitting: Adult Health

## 2021-12-10 ENCOUNTER — Other Ambulatory Visit: Payer: Self-pay

## 2021-12-10 ENCOUNTER — Ambulatory Visit (INDEPENDENT_AMBULATORY_CARE_PROVIDER_SITE_OTHER): Payer: PPO | Admitting: Adult Health

## 2021-12-10 VITALS — BP 146/80 | HR 76 | Temp 97.9°F | Wt 118.0 lb

## 2021-12-10 DIAGNOSIS — J301 Allergic rhinitis due to pollen: Secondary | ICD-10-CM

## 2021-12-10 DIAGNOSIS — J0100 Acute maxillary sinusitis, unspecified: Secondary | ICD-10-CM

## 2021-12-10 MED ORDER — AMOXICILLIN-POT CLAVULANATE 875-125 MG PO TABS: 1.0000 | ORAL_TABLET | Freq: Two times a day (BID) | ORAL | 0 refills | Status: AC

## 2021-12-10 MED ORDER — CETIRIZINE HCL 10 MG PO TABS: ORAL_TABLET | ORAL | 1 refills | Status: DC

## 2021-12-10 MED ORDER — PSEUDOEPHEDRINE HCL ER 120 MG PO TB12: 120.0000 mg | ORAL_TABLET | Freq: Two times a day (BID) | ORAL | 2 refills | Status: AC | PRN

## 2021-12-10 NOTE — Patient Instructions (Signed)
Suggest zyrtec 10 mg in AM Sudafed 120 mg just take once in AM

## 2021-12-10 NOTE — Progress Notes (Signed)
Assessment and Plan:  Sarah Rodriguez was seen today for sinus problem.  Diagnoses and all orders for this visit:  Acute non-recurrent maxillary sinusitis Switch H2i back to zyrtec, was doing better Sudafed, saline irrigations, nasal steroid, PUSH HYDRATION Abx as prescribed, contact office if no improvement in 2-3 days and will Will go to the ER if worsening headache, changes vision/speech, imbalance, weakness. -     pseudoephedrine (SUDAFED) 120 MG 12 hr tablet; Take 1 tablet (120 mg total) by mouth 2 (two) times daily as needed for congestion. -     amoxicillin-clavulanate (AUGMENTIN) 875-125 MG tablet; Take 1 tablet by mouth 2 (two) times daily for 7 days.  Non-seasonal allergic rhinitis due to pollen -     cetirizine (ZYRTEC) 10 MG tablet; TAKE 1 TABLET(10 MG) BY MOUTH DAILY   Further disposition pending results of labs. Discussed med's effects and SE's.   Over 30 minutes of exam, counseling, chart review, and critical decision making was performed.   Future Appointments  Date Time Provider [place]  12/16/2021  1:00 PM Shade Flood [facility] None  01/06/2022 11:00 AM Liane Comber, NP [facility] None  01/06/2022  1:00 PM Shade Flood [facility] None  02/09/2022  4:20 PM Arfeen, Arlyce Harman, MD [facility] None  09/30/2022  3:00 PM Unk Pinto, MD [facility] None    ------------------------------------------------------------------------------------------------------------------   HPI BP (!) 146/80    Pulse 76    Temp 97.9 F (36.6 C)    Wt 118 lb (53.5 kg)    SpO2 98%    BMI 21.93 kg/m  66 y.o.female with hx of asthma, deviated septum presents for evaluation of persistent URI.   She reports began 2 weeks ago or so with nasal congestion, sore throat after was around family member with viral URI, persistent nasal congestion, bil ear pressure, now having mucus post nasal drip, was having R sided frontal and maxillary sinus pressure/burning in the last few days, sinus headache.   Has been  doing humidifier, taking allegra D but limited benefit  Past Medical History:  Diagnosis Date   Asthma    Dulera daily   Chronic back pain    HNP   Complication of anesthesia    slow to wake up   Depression    takes Clonazepam nightly   GERD (gastroesophageal reflux disease)    takes Protonix daily    Heart murmur    History of migraine    takes Relpax daily as needed   HSV (herpes simplex virus) infection    Hyperlipidemia    takes Atorvastatin daily   Hypertension    takes Benicar daily   Hypothyroidism    takes Synthroid daily   MVP (mitral valve prolapse)    Pneumonia    hx of--been many yrs ago   Seasonal allergies    takes Allegra daily   Shortness of breath    with exertion     Allergies  Allergen Reactions   Demerol [Meperidine] Other (See Comments)    REACTION: unknown--itching   Epinephrine Other (See Comments)    "Sensitivity."   Niacin And Related Rash    Current Outpatient Medications on File Prior to Visit  Medication Sig   albuterol (PROAIR HFA) 108 (90 Base) MCG/ACT inhaler Inhale 2 puffs into the lungs every 6 (six) hours as needed for wheezing or shortness of breath.   amitriptyline (ELAVIL) 25 MG tablet Take 1 tablet (25 mg total) by mouth at bedtime.   cetirizine (ZYRTEC) 10 MG tablet TAKE 1 TABLET(10  MG) BY MOUTH DAILY   Cholecalciferol (VITAMIN D PO) Take 4,000 Units by mouth daily.    CINNAMON PO Take 1,000 Units by mouth 2 (two) times daily.    clonazePAM (KLONOPIN) 0.5 MG tablet Take 1/2 to 1 tablet as needed for severe anxiety.   diclofenac sodium (VOLTAREN) 1 % GEL Apply 4 g topically 4 (four) times daily.   DULoxetine HCl 40 MG CPEP Take 40 mg by mouth daily.   fluticasone (FLONASE) 50 MCG/ACT nasal spray INSTILL 2 SPRAYS IN EACH NOSTRIL EVERY EVENING   fluticasone-salmeterol (ADVAIR HFA) 115-21 MCG/ACT inhaler INHALE 2 PUFFS BY MOUTH EVERY 12 HOURS 10 TO 15 MINUTES APART. Rinse mouth and gargle after using to avoid thrush.    lamoTRIgine (LAMICTAL) 25 MG tablet Take 1 tablet (25 mg total) by mouth 2 (two) times daily.   levothyroxine (SYNTHROID) 50 MCG tablet Take 1/2 tablet  Daily  on an empty stomach with only water for 30 minutes & no Antacid meds, Calcium or Magnesium for 4 hours & avoid Biotin   losartan (COZAAR) 50 MG tablet TAKE 1 TABLET BY MOUTH DAILY FOR BLOOD PRESSURE   Magnesium 250 MG TABS Take 3 tablets by mouth daily.    meloxicam (MOBIC) 15 MG tablet Take  1 tablet   Daily  with Food for Pain & Inflammation   montelukast (SINGULAIR) 10 MG tablet Take  1 tablet  Daily  for Allergies   nystatin (MYCOSTATIN) 100000 UNIT/ML suspension SWISH AND SWALLOW 5 ML BY MOUTH FOUR TIMES DAILY FOR THRUSH OR YEAST INFECTION   ondansetron (ZOFRAN) 4 MG tablet Take 1 tablet (4 mg total) by mouth every 8 (eight) hours as needed.   pantoprazole (PROTONIX) 40 MG tablet Take  1 tablet  Daily  to Prevent Heartburn & Indigestion         /TAKE 1 TABLET BY MOUTH EVERY DAY   PREMARIN vaginal cream INSERT 1/2 GRAM VAGINALLY VIA APPLICATOR NIGHTLY FOR W EEKS THEN JUST DO TEICE A WEEK   rosuvastatin (CRESTOR) 40 MG tablet Take  1 tablet  2 x /week for Cholesterol   Spacer/Aero-Holding Chambers (AEROCHAMBER MV) inhaler Use as instructed with inhaler.   halobetasol (ULTRAVATE) 0.05 % ointment  (Patient not taking: Reported on 12/10/2021)   mirabegron ER (MYRBETRIQ) 50 MG TB24 tablet Take  1 tablet  Daily  for Bladder Control (Patient not taking: Reported on 12/10/2021)   pregabalin (LYRICA) 50 MG capsule Take 50 mg by mouth at bedtime. (Patient not taking: Reported on 12/10/2021)   Pseudoephedrine HCl 240 MG TB24 Take 1 tablet every Morning for Head & Ear congestion (Patient not taking: Reported on 12/10/2021)   Rimegepant Sulfate (NURTEC) 75 MG TBDP Take 75 mg by mouth as needed (migraine). (Patient not taking: Reported on 12/10/2021)   No current facility-administered medications on file prior to visit.    ROS: all negative except  above.   Physical Exam:  BP (!) 146/80    Pulse 76    Temp 97.9 F (36.6 C)    Wt 118 lb (53.5 kg)    SpO2 98%    BMI 21.93 kg/m   General Appearance: Well nourished, in no apparent distress. Eyes: PERRLA, conjunctiva no swelling or erythema Sinuses: R Frontal/maxillary tenderness, L non-tender ENT/Mouth: Ext aud canals clear, TMs without erythema, bulging. No erythema, swelling, or exudate on post pharynx.  Tonsils not swollen or erythematous. Hearing normal.  Neck: Supple Respiratory: Respiratory effort normal, BS equal bilaterally without rales, rhonchi, wheezing or stridor.  Cardio: RRR with no MRGs. Brisk peripheral pulses without edema.  Lymphatics: Non tender without lymphadenopathy.  Musculoskeletal: normal gait.  Skin: Warm, dry without rashes, lesions, ecchymosis.  Neuro: Cranial nerves intact. Normal muscle tone Psych: Awake and oriented X 3, normal affect, Insight and Judgment appropriate.     Izora Ribas, NP 2:48 PM [facility]

## 2021-12-15 ENCOUNTER — Encounter: Payer: Self-pay | Admitting: Adult Health

## 2021-12-16 ENCOUNTER — Other Ambulatory Visit: Payer: Self-pay | Admitting: Adult Health

## 2021-12-16 ENCOUNTER — Other Ambulatory Visit: Payer: Self-pay

## 2021-12-16 ENCOUNTER — Ambulatory Visit (HOSPITAL_COMMUNITY): Payer: PPO | Admitting: Licensed Clinical Social Worker

## 2021-12-16 DIAGNOSIS — F411 Generalized anxiety disorder: Secondary | ICD-10-CM | POA: Diagnosis not present

## 2021-12-16 DIAGNOSIS — F330 Major depressive disorder, recurrent, mild: Secondary | ICD-10-CM | POA: Diagnosis not present

## 2021-12-16 DIAGNOSIS — J301 Allergic rhinitis due to pollen: Secondary | ICD-10-CM

## 2021-12-16 MED ORDER — DEXAMETHASONE 0.5 MG PO TABS: ORAL_TABLET | ORAL | 0 refills | Status: DC

## 2021-12-16 NOTE — Progress Notes (Signed)
THERAPIST PROGRESS NOTE   Session Time: 1:00pm - 2:00pm   Location: Patient: [facility] [place] [facility] Provider: [facility] [place] [facility]   Participation Level: Active    Behavioral Response: Alert, casually dressed, depressed mood/affect    Type of Therapy:  Individual Therapy   Treatment Goals addressed: Depression and anxiety management; Medication compliance   Interventions: CBT: core beliefs and challenging irrational thoughts      Summary: Sarah Rodriguez is a 66 year old married Caucasian female on disability that presented for therapy appointment today with diagnoses of Major depressive disorder, recurrent, mild; and Generalized Anxiety Disorder.        Suicidal/Homicidal: None; without intent or plan.     Therapist Response:  Clinician met with Sarah Rodriguez for in person therapy session today, and assessed for safety, medication compliance, and sobriety.  Sarah Rodriguez presented for appointment on time, and was alert, oriented x5, with no evidence or self-report of active SI/HI or A/V H.  Sarah Rodriguez reported ongoing compliance with medication and denied use of alcohol or illicit substances. Clinician inquired about Sarah Rodriguez's emotional ratings today, as well as any significant changes in thoughts, feelings, or behavior since previous check-in.  Sarah Rodriguez reported scores of 8/10 for depression, 7/10 for anxiety, and 2/10 for anger/irritability.  She denied any recent panic attacks or outbursts.  Sarah Rodriguez reported that a recent success was having a good experience over the holidays, stating "Things went really well. We went to [place] and spent Christmas with my husband's children.  It was different, but I felt good about it.  She reported that she was also able to spend time with her daughter catching up later.  Sarah Rodriguez reported that she remained in a positive state of mind over the holidays due to contact with family, but has noticed depression and negative thinking increasing over recent weeks.  Sarah Rodriguez stated I just don't feel like I'm  as important as I thought I was.  Clinician continued discussion with Sarah Rodriguez on negative core beliefs today to assist.  Clinician tasked Sarah Rodriguez with identifying additional negative core beliefs that could be affecting her negative self-image and esteem, as well as discussion on strategies for countering negative thoughts in order to resolve them, such as reciting daily positive affirmations, or looking for evidence that contradicts these beliefs.  Intervention was effective, as evidenced by Sarah Rodriguez identifying sufficient evidence to counter the belief that she is not important or valuable to people within her support network, and reporting increased motivation to challenge negative thinking in order to modify these harmful beliefs. Sarah Rodriguez reported that to improve self-care and socialization efforts, she will plan to attend a sports game with her daughter over the weekend.  Clinician will continue to monitor.      Plan: Follow up for next session in 2 weeks.   Diagnosis: Major depressive disorder, recurrent, mild; and Generalized Anxiety Disorder.   Shade Flood, [place], LCAS 12/16/21

## 2021-12-20 ENCOUNTER — Other Ambulatory Visit: Payer: Self-pay | Admitting: Adult Health

## 2021-12-20 DIAGNOSIS — I10 Essential (primary) hypertension: Secondary | ICD-10-CM

## 2021-12-21 ENCOUNTER — Other Ambulatory Visit: Payer: Self-pay

## 2021-12-30 DIAGNOSIS — H43813 Vitreous degeneration, bilateral: Secondary | ICD-10-CM | POA: Diagnosis not present

## 2022-01-05 NOTE — Progress Notes (Signed)
[facility]    Annual Medicare Wellness Visit Due annually  Health maintenance reviewed Check about shingrix  Migraine with aura and without status migrainosus, not intractable Well managed by PRN OTC analgesic and phenergan  patient to go to ER if there is weakness, thunderclap headache, visual changes, or any concerning factors  Essential hypertension Monitor blood pressure at home; call if consistently over 130/80 Continue DASH diet.   Reminder to go to the ER if any CP, SOB, nausea, dizziness, severe HA, changes vision/speech, left arm numbness and tingling and jaw pain.  Uncomplicated asthma, unspecified asthma severity, unspecified whether persistent Well controlled on inhalers without recent flare  Gastroesophageal reflux disease, esophagitis presence not specified Fairly managed on current medications; PPI taper attempt failed Discussed diet, avoiding triggers and other lifestyle changes  Hypothyroidism, unspecified typ continue medications the same pending lab results reminded to take on an empty stomach 30-48mins before food.  check TSH level  Spondylolisthesis at L4-L5 level S/p fusion for repair Followed by Dr. Osborne Oman  Primary osteoarthritis involving multiple joints Well managed by motrin/tylenol/lyrica  Vitamin D deficiency At goal at recent check; continue to recommend supplementation Defer vitamin D level  Other abnormal glucose (hx of prediabetes) Discussed disease and risks Discussed diet/exercise, weight management  Check A1C q24m; defer today; monitor serum glucose   Medication management CBC, CMP/GFR  Hyperlipidemia, mixed Continue medications: atorvastatin Continue low cholesterol diet and exercise.  Check lipid panel.   Depression, major, recurrent, in partial remission ([place]) Followed by Dr. Adele Schilder Continue medications, reminded to limit benzo use to <5 days a week if possible to avoid dependence   Lifestyle discussed: diet/exerise, sleep hygiene, stress management, hydration  Chronic back pain, unspecified back location, unspecified back pain laterality Continue follow up with Dr. Lenn Cal lyrica for back pain  Sleep apnea Continue oral devide  BMI 21 Continue to recommend diet heavy in fruits and veggies and low in animal meats, cheeses, and dairy products, appropriate calorie intake Discuss exercise recommendations routinely Continue to monitor weight at each visit   Orders Placed This Encounter  Procedures   CBC with Differential/Platelet   COMPLETE METABOLIC PANEL WITH GFR   Magnesium   Lipid panel   TSH      Over 40 minutes of exam, counseling, chart review and critical decision making was performed Future Appointments  Date Time Provider [place]  01/27/2022  1:00 PM Shade Flood [facility] None  02/09/2022  4:20 PM Arfeen, Arlyce Harman, MD [facility] None  02/17/2022  1:00 PM Shade Flood [facility] None  09/30/2022  3:00 PM Unk Pinto, MD [facility] None  01/06/2023 11:00 AM Liane Comber, NP [facility] None     Plan:   During the course of the visit the patient was educated and counseled about appropriate screening and preventive services including:   Pneumococcal vaccine  Prevnar 13 Influenza vaccine Td vaccine Screening electrocardiogram Bone densitometry screening Colorectal cancer screening Diabetes screening Glaucoma screening Nutrition counseling  Advanced directives: requested   Subjective:  Sarah Rodriguez is a 66 y.o. female who presents for Medicare Annual Wellness Visit and 3 month follow up. Patient has GERD controlled on her meds.   She is recovering from sinusitis, reports has persistent R ear pressure/pain without fever/chills or ear discharge. She denies changes in hearing, has hearing aids.   She was referred to Dr. Augustina Mood who found a mild sleep apnea and was recommended an oral device and husband has noted improved  snoring.   She also has Chronic Pain Syndrome consequent of Cx DDD and Cx HNP surg in 2011 and then in 2013, she underwent L5/S1 Surg by Dr Ellene Route and lastly in Nov 2016, she had a Lumbar fusion. She is able to control her pain on extra strength tylenol, has lyrica with limited benefit, avoided opioids.  She has hx of migraines; reports rare, typically more sinus. Typically does well with 1/2 tab excedrine. She does have nurtec samples but hasn't needed to use.    She has history of asthma/allergies, states consistently has cough. She has had some sinus congestion/left side worse. Has seen ENT, flonase recommended.    she has a diagnosis of depression, follows with Dr. Adele Schilder currently fairly controlled on duloxetine, lamictal and amitryptiline, and currently takes klonopin 0.25-0.5 mg BID PRN anxiety and insomnia, reports symptoms are well controlled on current regimen. she takes 0.125 mg of klonopin at night for sleep, uses rarely during the day for anxiety.   Hx of GERD on protonix 40 mg daily, failed taper by GI.   BMI is Body mass index is 21.9 kg/m., she admits needs to do better on diet and exercise. Not exercising as much with cold weather, tries to go up and down strairs more often in home. Wt Readings from Last 3 Encounters:  01/06/22 117 lb 12.8 oz (53.4 kg)  12/10/21 118 lb (53.5 kg)  09/30/21 118 lb 12.8 oz (53.9 kg)   . Her blood pressure has been controlled at home, today their BP is BP: 118/68 She does workout. She denies chest pain, shortness of breath, dizziness.   She is on cholesterol medication (rosuvastatin 40 mg Tues, Thurs, was having cramping with higher doses) and denies myalgias. Her cholesterol is not at goal. The cholesterol last visit was:   Lab Results  Component Value Date   CHOL 144 09/30/2021   HDL 36 (L) 09/30/2021   LDLCALC 78 09/30/2021   TRIG 206 (H) 09/30/2021   CHOLHDL 4.0 09/30/2021    She has been working on diet and exercise for prediabetes,  and denies foot ulcerations, increased appetite, nausea, polydipsia, polyuria, visual disturbances, vomiting and weight loss. Last A1C in the office was:  Lab Results  Component Value Date   HGBA1C 5.6 09/30/2021   She is on thyroid medication. Her medication was not changed last visit. Taking 1/2 tab 50 mcg daily.  Lab Results  Component Value Date   TSH 1.27 09/30/2021   Last GFR: Lab Results  Component Value Date   GFRNONAA 67 04/16/2021   Patient is on Vitamin D supplement, taking 4000 IU daily, did reduce after last visit Lab Results  Component Value Date   VD25OH 103 (H) 09/30/2021       Medication Review: Current Outpatient Medications on File Prior to Visit  Medication Sig Dispense Refill   albuterol (PROAIR HFA) 108 (90 Base) MCG/ACT inhaler Inhale 2 puffs into the lungs every 6 (six) hours as needed for wheezing or shortness of breath. 8 g 1   amitriptyline (ELAVIL) 25 MG tablet Take 1 tablet (25 mg total) by mouth at bedtime. 90 tablet 0   cetirizine (ZYRTEC) 10 MG tablet TAKE 1 TABLET(10 MG) BY MOUTH DAILY 90 tablet 1   Cholecalciferol (VITAMIN D PO) Take 4,000 Units by mouth daily.      CINNAMON PO Take 1,000 Units by mouth 2 (two) times daily.      clonazePAM (KLONOPIN) 0.5 MG tablet Take 1/2 to 1 tablet as needed  for severe anxiety. 30 tablet 1   diclofenac sodium (VOLTAREN) 1 % GEL Apply 4 g topically 4 (four) times daily. 100 g 3   DULoxetine HCl 40 MG CPEP Take 40 mg by mouth daily. 90 capsule 0   fluticasone (FLONASE) 50 MCG/ACT nasal spray INSTILL 2 SPRAYS IN EACH NOSTRIL EVERY EVENING 48 g 3   fluticasone-salmeterol (ADVAIR HFA) 115-21 MCG/ACT inhaler INHALE 2 PUFFS BY MOUTH EVERY 12 HOURS 10 TO 15 MINUTES APART. Rinse mouth and gargle after using to avoid thrush. 36 g 3   lamoTRIgine (LAMICTAL) 25 MG tablet Take 1 tablet (25 mg total) by mouth 2 (two) times daily. 60 tablet 2   levothyroxine (SYNTHROID) 50 MCG tablet Take 1/2 tablet  Daily  on an empty  stomach with only water for 30 minutes & no Antacid meds, Calcium or Magnesium for 4 hours & avoid Biotin 90 tablet 1   losartan (COZAAR) 50 MG tablet Take  1 tablet  Daily  for BP                                                                               /                  TAKE BY MOUTH 90 tablet 3   Magnesium 250 MG TABS Take 3 tablets by mouth daily.      meloxicam (MOBIC) 15 MG tablet Take  1 tablet   Daily  with Food for Pain & Inflammation 90 tablet 3   montelukast (SINGULAIR) 10 MG tablet Take  1 tablet  Daily  for Allergies 90 tablet 3   ondansetron (ZOFRAN) 4 MG tablet Take 1 tablet (4 mg total) by mouth every 8 (eight) hours as needed. 20 tablet 0   pantoprazole (PROTONIX) 40 MG tablet Take  1 tablet  Daily  to Prevent Heartburn & Indigestion         /TAKE 1 TABLET BY MOUTH EVERY DAY 90 tablet 3   PREMARIN vaginal cream INSERT 1/2 GRAM VAGINALLY VIA APPLICATOR NIGHTLY FOR W EEKS THEN JUST DO TEICE A WEEK  11   pseudoephedrine (SUDAFED) 120 MG 12 hr tablet Take 1 tablet (120 mg total) by mouth 2 (two) times daily as needed for congestion. 20 tablet 2   rosuvastatin (CRESTOR) 40 MG tablet Take  1 tablet  2 x /week for Cholesterol 26 tablet 3   Spacer/Aero-Holding Chambers (AEROCHAMBER MV) inhaler Use as instructed with inhaler. 1 each 0   dexamethasone (DECADRON) 0.5 MG tablet take 1 tablet PO BID for 3 days, then take 1 tablet PO for 4 days. (Patient not taking: Reported on 01/06/2022) 10 tablet 0   halobetasol (ULTRAVATE) 0.05 % ointment  (Patient not taking: Reported on 12/10/2021)  3   mirabegron ER (MYRBETRIQ) 50 MG TB24 tablet Take  1 tablet  Daily  for Bladder Control (Patient not taking: Reported on 01/06/2022) 90 tablet 3   nystatin (MYCOSTATIN) 100000 UNIT/ML suspension SWISH AND SWALLOW 5 ML BY MOUTH FOUR TIMES DAILY FOR THRUSH OR YEAST INFECTION 480 mL 1   pregabalin (LYRICA) 50 MG capsule Take 50 mg by mouth at bedtime. (Patient not taking: Reported on 12/10/2021)  Rimegepant  Sulfate (NURTEC) 75 MG TBDP Take 75 mg by mouth as needed (migraine). (Patient not taking: Reported on 12/10/2021) 2 tablet 0   No current facility-administered medications on file prior to visit.    Allergies  Allergen Reactions   Demerol [Meperidine] Other (See Comments)    REACTION: unknown--itching   Epinephrine Other (See Comments)    "Sensitivity."   Niacin And Related Rash    Current Problems (verified) Patient Active Problem List   Diagnosis Date Noted   Sleep apnea 06/12/2020   Deviated septum 03/14/2018   Chronic back pain 12/21/2017   Depression, major, recurrent, in partial remission ([place]) 08/05/2016   Degenerative joint disease 08/05/2016   Migraine 03/03/2016   Spondylolisthesis at L4-L5 level 10/27/2015   Other abnormal glucose (hx of prediabetes) 12/12/2014   Vitamin D deficiency 04/17/2014   Medication management 04/17/2014   Hyperlipidemia, mixed    Hypertension    Asthma    GERD (gastroesophageal reflux disease)    Hypothyroidism     Screening Tests Immunization History  Administered Date(s) Administered   Influenza Inj Mdck Quad Pf 08/27/2019   Influenza Inj Mdck Quad With Preservative 09/14/2017, 10/19/2018, 09/24/2020   Influenza,inj,Quad PF,6+ Mos 10/06/2021   Influenza,inj,quad, With Preservative 11/09/2016   Influenza-Unspecified 09/18/2013   Moderna Sars-Covid-2 Vaccination 02/15/2020, 03/18/2020, 11/10/2020   PPD Test 04/17/2014   Pneumococcal-Unspecified 11/30/1995   Td 08/27/2005, 05/05/2016   Health Maintenance  Topic Date Due   Zoster Vaccines- Shingrix (1 of 2) Never done   COVID-19 Vaccine (4 - Booster for Moderna series) 01/05/2021   MAMMOGRAM  03/02/2022   TETANUS/TDAP  05/05/2026   COLONOSCOPY (Pts 45-2yrs Insurance coverage will need to be confirmed)  02/26/2031   INFLUENZA VACCINE  Completed   Hepatitis C Screening  Completed   HIV Screening  Completed   HPV VACCINES  Aged Out   Last colonoscopy: 2014 Dr. Cristina Gong, polyp,  01/2021 normal, 10 year recall  Last mammogram: annually at Dr. Valentino Saxon, last 02/2021, has upcoming scheduled 02/2022 Last pap smear/pelvic exam: at GYN, following annually DEXA: GYN is following, plan to start age 65  Shingles/Zostavax: declines due to cost   Names of Other Physician/Practitioners you currently use: 1. [facility]- here for primary care 2. Dr. [place], eye doctor, last visit 2023, wears glasses.  3. Dr. Carlye Grippe, dentist, last visit 2022  Patient Care Team: Unk Pinto, MD as PCP - General (Internal Medicine) Kristeen Miss, MD as Consulting Physician (Neurosurgery) Kathlee Nations, MD as Consulting Physician (Psychiatry)  SURGICAL HISTORY She  has a past surgical history that includes Dilation and curettage of uterus (2011); Ablation (2011); Cholecystectomy (2008); Tonsillectomy; buttocks surgery; Cesarean section (1992); right knee arthroscopy; Cervical fusion (03/2010); Colonoscopy; Esophagogastroduodenoscopy; and Lumbar laminectomy/decompression microdiscectomy (10/24/2012). FAMILY HISTORY Her family history includes Allergies in her mother; Arthritis in her mother; Depression in her mother; Heart attack in her father and mother; Hypertension in her mother. SOCIAL HISTORY She  reports that she has quit smoking. She has never used smokeless tobacco. She reports that she does not drink alcohol and does not use drugs.  MEDICARE WELLNESS OBJECTIVES: Physical activity:   Cardiac risk factors:   Depression/mood screen:   Depression screen [facility] 2/9 09/30/2021  Decreased Interest 0  Down, Depressed, Hopeless 0  PHQ - 2 Score 0  Altered sleeping -  Tired, decreased energy -  Change in appetite -  Feeling bad or failure about yourself  -  Trouble concentrating -  Moving  slowly or fidgety/restless -  Suicidal thoughts -  PHQ-9 Score -  Difficult doing work/chores -  Some encounter information is confidential and restricted. Go to  Review Flowsheets activity to see all data.  Some recent data might be hidden    ADLs:  In your present state of health, do you have any difficulty performing the following activities: 01/06/2021  Hearing? Y  Comment planning on getting hearing aid  Vision? N  Difficulty concentrating or making decisions? N  Walking or climbing stairs? N  Dressing or bathing? N  Doing errands, shopping? N  Some recent data might be hidden     Cognitive Testing  Alert? Yes  Normal Appearance?Yes  Oriented to person? Yes  Place? Yes   Time? Yes  Recall of three objects?  Yes  Can perform simple calculations? Yes  Displays appropriate judgment?Yes  Can read the correct time from a watch face?Yes  EOL planning: Does Patient Have a Medical Advance Directive?: Yes Type of Advance Directive: Healthcare Power of Attorney, Living will Does patient want to make changes to medical advance directive?: No - Patient declined Copy of [place] in Chart?: No - copy requested   Review of Systems  Constitutional:  Negative for malaise/fatigue and weight loss.  HENT:  Positive for ear pain (R ear). Negative for congestion, ear discharge, hearing loss, sinus pain, sore throat and tinnitus.   Eyes:  Negative for blurred vision and double vision.  Respiratory:  Negative for cough, sputum production, shortness of breath and wheezing.   Cardiovascular:  Negative for chest pain, palpitations, orthopnea, claudication, leg swelling and PND.  Gastrointestinal:  Negative for abdominal pain, blood in stool, constipation, diarrhea, heartburn, melena, nausea and vomiting.  Genitourinary: Negative.   Musculoskeletal:  Positive for back pain (intermittent, improved). Negative for falls, joint pain and myalgias.  Skin:  Negative for rash.  Neurological:  Negative for dizziness, tingling, sensory change, weakness and headaches.  Endo/Heme/Allergies:  Negative for polydipsia.  Psychiatric/Behavioral:  Positive  for depression (chronic, psych following). Negative for memory loss, substance abuse and suicidal ideas. The patient is not nervous/anxious and does not have insomnia.   All other systems reviewed and are negative.   Objective:     Today's Vitals   01/06/22 1104  BP: 118/68  Pulse: 79  Temp: 97.7 F (36.5 C)  SpO2: 99%  Weight: 117 lb 12.8 oz (53.4 kg)   Body mass index is 21.9 kg/m.  General appearance: alert, no distress, WD/WN, female HEENT: normocephalic, sclerae anicteric, TMs pearly, R mildly distended without purulence or erythema, nares patent, no discharge or erythema, dry/flaky skin in canals; Good dentition, + R TMJ Neck: supple, no lymphadenopathy, no thyromegaly, no masses Heart: RRR, normal S1, S2,  no murmur Lungs: CTA bilaterally, no wheezes, rhonchi, or rales Abdomen: +bs, soft, non tender, non distended, no masses, no hepatomegaly, no splenomegaly Musculoskeletal: nontender, no swelling, no obvious deformity Extremities: no edema, no cyanosis, no clubbing Pulses: 2+ symmetric, upper and lower extremities, normal cap refill Neurological: alert, oriented x 3, CN2-12 intact, strength normal upper extremities and lower extremities, sensation normal throughout, DTRs 2+ throughout, no cerebellar signs, gait normal/steady Psychiatric: normal affect, behavior normal, pleasant   Medicare Attestation I have personally reviewed: The patient's medical and social history Their use of alcohol, tobacco or illicit drugs Their current medications and supplements The patient's functional ability including ADLs,fall risks, home safety risks, cognitive, and hearing and visual impairment Diet and physical activities Evidence for depression  or mood disorders  The patient's weight, height, BMI, and visual acuity have been recorded in the chart.  I have made referrals, counseling, and provided education to the patient based on review of the above and I have provided the patient with  a written personalized care plan for preventive services.     Sarah Ribas, NP   01/06/2022

## 2022-01-06 ENCOUNTER — Ambulatory Visit (INDEPENDENT_AMBULATORY_CARE_PROVIDER_SITE_OTHER): Payer: PPO | Admitting: Adult Health

## 2022-01-06 ENCOUNTER — Ambulatory Visit (HOSPITAL_COMMUNITY): Payer: PPO | Admitting: Licensed Clinical Social Worker

## 2022-01-06 ENCOUNTER — Other Ambulatory Visit: Payer: Self-pay

## 2022-01-06 ENCOUNTER — Encounter: Payer: Self-pay | Admitting: Adult Health

## 2022-01-06 VITALS — BP 118/68 | HR 79 | Temp 97.7°F | Wt 117.8 lb

## 2022-01-06 DIAGNOSIS — J45909 Unspecified asthma, uncomplicated: Secondary | ICD-10-CM | POA: Diagnosis not present

## 2022-01-06 DIAGNOSIS — G4730 Sleep apnea, unspecified: Secondary | ICD-10-CM | POA: Diagnosis not present

## 2022-01-06 DIAGNOSIS — M150 Primary generalized (osteo)arthritis: Secondary | ICD-10-CM

## 2022-01-06 DIAGNOSIS — G43109 Migraine with aura, not intractable, without status migrainosus: Secondary | ICD-10-CM

## 2022-01-06 DIAGNOSIS — R6889 Other general symptoms and signs: Secondary | ICD-10-CM

## 2022-01-06 DIAGNOSIS — K219 Gastro-esophageal reflux disease without esophagitis: Secondary | ICD-10-CM

## 2022-01-06 DIAGNOSIS — Z79899 Other long term (current) drug therapy: Secondary | ICD-10-CM

## 2022-01-06 DIAGNOSIS — H6504 Acute serous otitis media, recurrent, right ear: Secondary | ICD-10-CM

## 2022-01-06 DIAGNOSIS — E782 Mixed hyperlipidemia: Secondary | ICD-10-CM

## 2022-01-06 DIAGNOSIS — J301 Allergic rhinitis due to pollen: Secondary | ICD-10-CM | POA: Diagnosis not present

## 2022-01-06 DIAGNOSIS — Z0001 Encounter for general adult medical examination with abnormal findings: Secondary | ICD-10-CM

## 2022-01-06 DIAGNOSIS — I10 Essential (primary) hypertension: Secondary | ICD-10-CM | POA: Diagnosis not present

## 2022-01-06 DIAGNOSIS — M159 Polyosteoarthritis, unspecified: Secondary | ICD-10-CM | POA: Diagnosis not present

## 2022-01-06 DIAGNOSIS — F3341 Major depressive disorder, recurrent, in partial remission: Secondary | ICD-10-CM

## 2022-01-06 DIAGNOSIS — E559 Vitamin D deficiency, unspecified: Secondary | ICD-10-CM | POA: Diagnosis not present

## 2022-01-06 DIAGNOSIS — E039 Hypothyroidism, unspecified: Secondary | ICD-10-CM

## 2022-01-06 DIAGNOSIS — Z6821 Body mass index (BMI) 21.0-21.9, adult: Secondary | ICD-10-CM

## 2022-01-06 DIAGNOSIS — R7309 Other abnormal glucose: Secondary | ICD-10-CM | POA: Diagnosis not present

## 2022-01-06 DIAGNOSIS — Z0000 Encounter for general adult medical examination without abnormal findings: Secondary | ICD-10-CM

## 2022-01-06 MED ORDER — DEXAMETHASONE 0.5 MG PO TABS: ORAL_TABLET | ORAL | 0 refills | Status: DC

## 2022-01-06 NOTE — Patient Instructions (Signed)
Sarah Rodriguez , Thank you for taking time to come for your Medicare Wellness Visit. I appreciate your ongoing commitment to your health goals. Please review the following plan we discussed and let me know if I can assist you in the future.   These are the goals we discussed:  Goals      Blood Pressure < 130/80     Exercise 150 min/wk Moderate Activity     Aim for 20-30 min of intentional exercise daily      LDL CALC < 100        This is a list of the screening recommended for you and due dates:  Health Maintenance  Topic Date Due   Zoster (Shingles) Vaccine (1 of 2) Never done   COVID-19 Vaccine (4 - Booster for Moderna series) 01/05/2021   Mammogram  03/02/2022   Tetanus Vaccine  05/05/2026   Colon Cancer Screening  02/26/2031   Flu Shot  Completed   Hepatitis C Screening: USPSTF Recommendation to screen - Ages 18-79 yo.  Completed   HIV Screening  Completed   HPV Vaccine  Aged Out    Otitis Media With Effusion, Adult Otitis media with effusion (OME) is inflammation and fluid (effusion) in the middle ear without having an ear infection. The middle ear is the space behind the eardrum. The middle ear is connected to the back of the throat by a narrow tube (eustachian tube). Normally the eustachian tube drains fluid out of the middle ear. A swollen eustachian tube can become blocked and cause fluid to collect in the middle ear. OME often goes away without treatment. Sometimes OME can lead to hearing problems and recurrent acute ear infections (acute otitis media). These conditions may require treatment. What are the causes? OME is caused by a blocked eustachian tube. This can result from: Allergies. Upper respiratory infections. Enlarged adenoids. The adenoids are areas of soft tissue located high in the back of the throat, behind the nose and the roof of the mouth. They are part of the body's natural defense system (immune system). Rapid changes in pressure, like when an airplane  is descending or during scuba diving. In some cases, the cause of this condition is not known. What are the signs or symptoms? Common symptoms of this condition include: A feeling of fullness in your ear. Decreased hearing in the affected ear. Fluid draining into the ear canal. Pain in the ear. In some cases, there are no symptoms. How is this diagnosed? A health care provider can diagnose OME based on signs and symptoms of the condition. Your provider will also do a physical exam to check for fluid behind the eardrum. During the exam, your health care provider will use an instrument called an otoscope to look in your ear. Your health care provider may do other tests, such as: A hearing test. A tympanogram. This is a test that shows how well the eardrum moves in response to air pressure in the ear canal. It provides a graph for your health care provider to review. A pneumatic otoscopy. This is a test to check how your eardrum moves in response to changes in pressure. It is done by squeezing a small amount of air into the ear. How is this treated? Treatment for OME depends on the cause of the condition and the severity of symptoms. The first step is often waiting to see if the fluid drains on its own in a few weeks. Home care treatment may include: Over-the-counter pain relievers.  A warm, moist cloth placed over the ear. Severe cases may require a procedure to insert tubes in the ears (tympanostomy tubes) to drain the fluid. Follow these instructions at home: Take over-the-counter and prescription medicines only as told by your health care provider. Keep all follow-up visits. Contact a health care provider if: You have pain that gets worse. Hearing in your affected ear gets worse. You have fluid draining from your ear canal. You have dizziness. You develop a fever. Get help right away if: You develop a severe headache. You completely lose hearing in the affected ear. You have bleeding  from your ear canal. You have sudden and severe pain in your ear. These symptoms may represent a serious problem that is an emergency. Do not wait to see if the symptoms will go away. Get medical help right away. Call your local emergency services (911 in the [place] Do not drive yourself to the hospital. Summary Otitis media with effusion (OME) is inflammation and fluid (effusion) in the middle ear without having an ear infection. A swollen eustachian tube can become blocked and cause fluid to collect in the middle ear. Treatment for OME depends on the cause of the condition and the severity of symptoms. Many times, treatment is not needed because the fluid drains on its own in a few weeks. Sometimes OME can lead to hearing problems and recurrent acute ear infections (acute otitis media), which may require treatment. This information is not intended to replace advice given to you by your health care provider. Make sure you discuss any questions you have with your health care provider. Document Revised: 03/12/2021 Document Reviewed: 03/12/2021 Elsevier Patient Education  [place].

## 2022-01-07 LAB — COMPLETE METABOLIC PANEL WITH GFR
AG Ratio: 2 (calc) (ref 1.0–2.5)
ALT: 19 U/L (ref 6–29)
AST: 17 U/L (ref 10–35)
Albumin: 4.6 g/dL (ref 3.6–5.1)
Alkaline phosphatase (APISO): 65 U/L (ref 37–153)
BUN: 23 mg/dL (ref 7–25)
CO2: 30 mmol/L (ref 20–32)
Calcium: 10 mg/dL (ref 8.6–10.4)
Chloride: 103 mmol/L (ref 98–110)
Creat: 1 mg/dL (ref 0.50–1.05)
Globulin: 2.3 g/dL (calc) (ref 1.9–3.7)
Glucose, Bld: 97 mg/dL (ref 65–99)
Potassium: 4.3 mmol/L (ref 3.5–5.3)
Sodium: 142 mmol/L (ref 135–146)
Total Bilirubin: 0.5 mg/dL (ref 0.2–1.2)
Total Protein: 6.9 g/dL (ref 6.1–8.1)
eGFR: 63 mL/min/{1.73_m2} (ref >=60)

## 2022-01-07 LAB — CBC WITH DIFFERENTIAL/PLATELET
Absolute Monocytes: 416 cells/uL (ref 200–950)
Basophils Absolute: 33 cells/uL (ref 0–200)
Basophils Relative: 0.5 %
Eosinophils Absolute: 117 cells/uL (ref 15–500)
Eosinophils Relative: 1.8 %
HCT: 44.6 % (ref 35.0–45.0)
Hemoglobin: 14.9 g/dL (ref 11.7–15.5)
Lymphs Abs: 1612 cells/uL (ref 850–3900)
MCH: 31.1 pg (ref 27.0–33.0)
MCHC: 33.4 g/dL (ref 32.0–36.0)
MCV: 93.1 fL (ref 80.0–100.0)
MPV: 10.2 fL (ref 7.5–12.5)
Monocytes Relative: 6.4 %
Neutro Abs: 4323 cells/uL (ref 1500–7800)
Neutrophils Relative %: 66.5 %
Platelets: 258 10*3/uL (ref 140–400)
RBC: 4.79 10*6/uL (ref 3.80–5.10)
RDW: 11.6 % (ref 11.0–15.0)
Total Lymphocyte: 24.8 %
WBC: 6.5 10*3/uL (ref 3.8–10.8)

## 2022-01-07 LAB — LIPID PANEL
Cholesterol: 173 mg/dL (ref <200)
HDL: 47 mg/dL — ABNORMAL LOW (ref >=50)
LDL Cholesterol (Calc): 98 mg/dL (calc)
Non-HDL Cholesterol (Calc): 126 mg/dL (calc) (ref <130)
Total CHOL/HDL Ratio: 3.7 (calc) (ref <5.0)
Triglycerides: 180 mg/dL — ABNORMAL HIGH (ref <150)

## 2022-01-07 LAB — MAGNESIUM: Magnesium: 2 mg/dL (ref 1.5–2.5)

## 2022-01-07 LAB — TSH: TSH: 1.96 mIU/L (ref 0.40–4.50)

## 2022-01-25 ENCOUNTER — Telehealth (HOSPITAL_COMMUNITY): Payer: Self-pay

## 2022-01-25 DIAGNOSIS — F331 Major depressive disorder, recurrent, moderate: Secondary | ICD-10-CM

## 2022-01-25 DIAGNOSIS — F411 Generalized anxiety disorder: Secondary | ICD-10-CM

## 2022-01-25 MED ORDER — AMITRIPTYLINE HCL 25 MG PO TABS: 25.0000 mg | ORAL_TABLET | Freq: Every day | ORAL | 0 refills | Status: DC

## 2022-01-25 NOTE — Telephone Encounter (Signed)
Done

## 2022-01-25 NOTE — Telephone Encounter (Signed)
Medication refill - Fax from pt's [facility] for a new Amitriptyline order, last provided for 90 day supply on 11/05/21 and patient's next appointment not until 02/09/22.

## 2022-01-27 ENCOUNTER — Other Ambulatory Visit: Payer: Self-pay

## 2022-01-27 ENCOUNTER — Ambulatory Visit (HOSPITAL_COMMUNITY): Payer: PPO | Admitting: Licensed Clinical Social Worker

## 2022-01-27 DIAGNOSIS — F411 Generalized anxiety disorder: Secondary | ICD-10-CM | POA: Diagnosis not present

## 2022-01-27 DIAGNOSIS — F330 Major depressive disorder, recurrent, mild: Secondary | ICD-10-CM | POA: Diagnosis not present

## 2022-01-27 NOTE — Progress Notes (Signed)
THERAPIST PROGRESS NOTE ?  ?Session Time: 1:00pm - 2:00pm   ? ?Location: ?Patient: [facility] [place] [facility] ?Provider: [facility] [place] [facility] ?  ?Participation Level: Active  ?  ?Behavioral Response: Alert, casually dressed, depressed mood/affect  ?  ?Type of Therapy:  Individual Therapy ?  ?Treatment Goals addressed: Depression and anxiety management; Medication compliance ? ?Progress Towards Goals: Progressing ?  ?Interventions: CBT, mental grounding techniques  ?  ?Summary: Sarah Rodriguez is a 66 year old married Caucasian female on disability that presented for therapy appointment today with diagnoses of Major depressive disorder, recurrent, mild; and Generalized Anxiety Disorder.      ?  ?Suicidal/Homicidal: None; without intent or plan.   ?  ?Therapist Response:  Clinician met with Sarah Rodriguez for in person therapy appointment today, and assessed for safety, medication compliance, and sobriety.  Sarah Rodriguez presented for session on time, and was alert, oriented x5, with no evidence or self-report of active SI/HI or A/V H.  Sarah Rodriguez reported that she continues taking medication as prescribed and denied use of alcohol or illicit substances. Clinician inquired about Sarah Rodriguez's current emotional ratings, as well as any significant changes in thoughts, feelings, or behavior since last check-in.  Sarah Rodriguez reported scores of 6/10 for depression, 6/10 for anxiety, and 3/10 for anger/irritability.  She denied any recent panic attacks or outbursts.  Sarah Rodriguez reported that a recent success was noticing that her sleep has improved since she began challenging anxious thoughts at bedtime using skills learned from therapy.  She reported that one struggle has been preparing for an upcoming trip her husband suggested, noting various fears about what could go wrong.  Sarah Rodriguez stated ?I'm trying to figure out how I can still go but not panic?.  Clinician suggested discussion on grounding techniques today with Sarah Rodriguez based upon ongoing issues with anxiety.  Clinician  explained how grounding techniques can be utilized to temporarily distract from distressing thoughts, or feelings related to her anxiety, and covered category of mental grounding techniques with her today, including examples for practice such as describing one's environment in detail, playing a categories game, describing an everyday activity in great detail, using one's imagination to visualize a calming image, counting to 10, and more.  Intervention was effective, as evidenced by Sarah Rodriguez engaging in discussion on the subject and actively trying some of these techniques out in session, noting that they could be helpful if she practices them regularly.  Sarah Rodriguez reported that she has some familiarity with grounding in general from past therapy, and would add these to previous skills to practice while driving.  Clinician will continue to monitor.             ? ?Plan: Follow up for next session in 2 weeks.  ? ?Diagnosis: Major depressive disorder, recurrent, mild; and Generalized Anxiety Disorder. ? ?Collaboration of Care:   No collaboration of care required for this visit. ?                                                  ?Patient/Guardian was advised Release of Information must be obtained prior to any record release in order to collaborate their care with an outside provider. Patient/Guardian was advised if they have not already done so to contact the registration department to sign all necessary forms in order for [place] to release information regarding their care.  ?  ?  Consent: Patient/Guardian gives verbal consent for treatment and assignment of benefits for services provided during this visit. Patient/Guardian expressed understanding and agreed to proceed. ?  ?Shade Flood, LCSW, LCAS ?01/27/22 ? ?

## 2022-01-28 ENCOUNTER — Other Ambulatory Visit (HOSPITAL_COMMUNITY): Payer: Self-pay | Admitting: Psychiatry

## 2022-01-28 DIAGNOSIS — F331 Major depressive disorder, recurrent, moderate: Secondary | ICD-10-CM

## 2022-01-28 DIAGNOSIS — F411 Generalized anxiety disorder: Secondary | ICD-10-CM

## 2022-02-03 ENCOUNTER — Other Ambulatory Visit (HOSPITAL_COMMUNITY): Payer: Self-pay | Admitting: Psychiatry

## 2022-02-03 DIAGNOSIS — F331 Major depressive disorder, recurrent, moderate: Secondary | ICD-10-CM

## 2022-02-08 ENCOUNTER — Other Ambulatory Visit: Payer: Self-pay | Admitting: Adult Health

## 2022-02-08 ENCOUNTER — Telehealth (HOSPITAL_COMMUNITY): Payer: PPO | Admitting: Psychiatry

## 2022-02-08 DIAGNOSIS — J4520 Mild intermittent asthma, uncomplicated: Secondary | ICD-10-CM

## 2022-02-09 ENCOUNTER — Telehealth (HOSPITAL_BASED_OUTPATIENT_CLINIC_OR_DEPARTMENT_OTHER): Payer: PPO | Admitting: Psychiatry

## 2022-02-09 ENCOUNTER — Other Ambulatory Visit: Payer: Self-pay

## 2022-02-09 ENCOUNTER — Encounter (HOSPITAL_COMMUNITY): Payer: Self-pay | Admitting: Psychiatry

## 2022-02-09 VITALS — Wt 117.0 lb

## 2022-02-09 DIAGNOSIS — F331 Major depressive disorder, recurrent, moderate: Secondary | ICD-10-CM | POA: Diagnosis not present

## 2022-02-09 DIAGNOSIS — F411 Generalized anxiety disorder: Secondary | ICD-10-CM

## 2022-02-09 MED ORDER — LAMOTRIGINE 25 MG PO TABS: 25.0000 mg | ORAL_TABLET | Freq: Two times a day (BID) | ORAL | 0 refills | Status: DC

## 2022-02-09 MED ORDER — CLONAZEPAM 0.5 MG PO TABS: ORAL_TABLET | ORAL | 1 refills | Status: DC

## 2022-02-09 MED ORDER — DULOXETINE HCL 20 MG PO CPEP: 40.0000 mg | ORAL_CAPSULE | Freq: Every day | ORAL | 0 refills | Status: DC

## 2022-02-09 MED ORDER — AMITRIPTYLINE HCL 25 MG PO TABS: 25.0000 mg | ORAL_TABLET | Freq: Every day | ORAL | 0 refills | Status: DC

## 2022-02-09 NOTE — Progress Notes (Signed)
Virtual Visit via Telephone Note ? ?I connected with Sarah Rodriguez on 02/09/22 at  4:20 PM EDT by telephone and verified that I am speaking with the correct person using two identifiers. ? ?Location: ?Patient: Home ?Provider: Home Office ?  ?I discussed the limitations, risks, security and privacy concerns of performing an evaluation and management service by telephone and the availability of in person appointments. I also discussed with the patient that there may be a patient responsible charge related to this service. The patient expressed understanding and agreed to proceed. ? ? ?History of Present Illness: ?Patient is evaluated by phone session.  She is doing much better on her medication.  She is still take Klonopin one fourth when she feels nervous and sometimes half tablet.  She denies any major panic attack.  Patient had mixed feelings because her husband told to had a visit to [place] and coming months and she is very nervous about traveling.  She will remember had a panic attack while traveling on the plane but this time she is driving.  She is still not sure but like to visit up [place].  Overall she feels things are going well.  She is sleeping good.  She denies any crying spells, paranoia, hallucination or any suicidal thoughts.  She sleeps good with the amitriptyline.  Her insurance does not approve Cymbalta 40 mg so she has to take 20 mg 2 capsules a day.  She has no rash with the Lamictal.  She is in therapy with Georgina Snell.  Her appetite is okay.  Energy level is good.  Her weight is stable. ? ?Past Psychiatric History:  ?H/O depression and taking antidepressants since 1996.  H/O panic attacks while driving.  Saw Dr. Letta Moynahan and tried Prozac, Paxil, Zoloft, Xanax, Cymbalta and Klonopin.  No h/o inpatient treatment or any suicidal attempt. ? ?Recent Results (from the past 2160 hour(s))  ?CBC with Differential/Platelet     Status: None  ? Collection Time: 01/06/22 11:49 AM  ?Result Value Ref  Range  ? WBC 6.5 3.8 - 10.8 Thousand/uL  ? RBC 4.79 3.80 - 5.10 Million/uL  ? Hemoglobin 14.9 11.7 - 15.5 g/dL  ? HCT 44.6 35.0 - 45.0 %  ? MCV 93.1 80.0 - 100.0 fL  ? MCH 31.1 27.0 - 33.0 pg  ? MCHC 33.4 32.0 - 36.0 g/dL  ? RDW 11.6 11.0 - 15.0 %  ? Platelets 258 140 - 400 Thousand/uL  ? MPV 10.2 7.5 - 12.5 fL  ? Neutro Abs 4,323 1,500 - 7,800 cells/uL  ? Lymphs Abs 1,612 850 - 3,900 cells/uL  ? Absolute Monocytes 416 200 - 950 cells/uL  ? Eosinophils Absolute 117 15 - 500 cells/uL  ? Basophils Absolute 33 0 - 200 cells/uL  ? Neutrophils Relative % 66.5 %  ? Total Lymphocyte 24.8 %  ? Monocytes Relative 6.4 %  ? Eosinophils Relative 1.8 %  ? Basophils Relative 0.5 %  ?COMPLETE METABOLIC PANEL WITH GFR     Status: None  ? Collection Time: 01/06/22 11:49 AM  ?Result Value Ref Range  ? Glucose, Bld 97 65 - 99 mg/dL  ?  Comment: . ?           Fasting reference interval ?. ?  ? BUN 23 7 - 25 mg/dL  ? Creat 1.00 0.50 - 1.05 mg/dL  ? eGFR 63 > OR = 60 mL/min/1.44m  ?  Comment: The eGFR is based on the CKD-EPI 2021 equation. To calculate  ?the  new eGFR from a previous Creatinine or Cystatin C ?result, go to https://www.kidney.org/professionals/ ?kdoqi/gfr%5Fcalculator ?  ? BUN/Creatinine Ratio NOT APPLICABLE 6 - 22 (calc)  ? Sodium 142 135 - 146 mmol/L  ? Potassium 4.3 3.5 - 5.3 mmol/L  ? Chloride 103 98 - 110 mmol/L  ? CO2 30 20 - 32 mmol/L  ? Calcium 10.0 8.6 - 10.4 mg/dL  ? Total Protein 6.9 6.1 - 8.1 g/dL  ? Albumin 4.6 3.6 - 5.1 g/dL  ? Globulin 2.3 1.9 - 3.7 g/dL (calc)  ? AG Ratio 2.0 1.0 - 2.5 (calc)  ? Total Bilirubin 0.5 0.2 - 1.2 mg/dL  ? Alkaline phosphatase (APISO) 65 37 - 153 U/L  ? AST 17 10 - 35 U/L  ? ALT 19 6 - 29 U/L  ?Magnesium     Status: None  ? Collection Time: 01/06/22 11:49 AM  ?Result Value Ref Range  ? Magnesium 2.0 1.5 - 2.5 mg/dL  ?Lipid panel     Status: Abnormal  ? Collection Time: 01/06/22 11:49 AM  ?Result Value Ref Range  ? Cholesterol 173 <200 mg/dL  ? HDL 47 (L) > OR = 50 mg/dL  ?  Triglycerides 180 (H) <150 mg/dL  ? LDL Cholesterol (Calc) 98 mg/dL (calc)  ?  Comment: Reference range: <100 ?Marland Kitchen ?Desirable range <100 mg/dL for primary prevention;   ?<70 mg/dL for patients with CHD or diabetic patients  ?with > or = 2 CHD risk factors. ?. ?LDL-C is now calculated using the Martin-Hopkins  ?calculation, which is a validated novel method providing  ?better accuracy than the Friedewald equation in the  ?estimation of LDL-C.  ?Cresenciano Genre et al. Annamaria Helling. 1194;174(08): 2061-2068  ?(http://education.[employer].com/faq/FAQ164) ?  ? Total CHOL/HDL Ratio 3.7 <5.0 (calc)  ? Non-HDL Cholesterol (Calc) 126 <130 mg/dL (calc)  ?  Comment: For patients with diabetes plus 1 major ASCVD risk  ?factor, treating to a non-HDL-C goal of <100 mg/dL  ?(LDL-C of <70 mg/dL) is considered a therapeutic  ?option. ?  ?TSH     Status: None  ? Collection Time: 01/06/22 11:49 AM  ?Result Value Ref Range  ? TSH 1.96 0.40 - 4.50 mIU/L  ?  ? ?Psychiatric Specialty Exam: ?Physical Exam  ?Review of Systems  ?Weight 117 lb (53.1 kg).There is no height or weight on file to calculate BMI.  ?General Appearance: NA  ?Eye Contact:  NA  ?Speech:  Clear and Coherent and Normal Rate  ?Volume:  Normal  ?Mood:  Euthymic  ?Affect:  NA  ?Thought Process:  Goal Directed  ?Orientation:  Full (Time, Place, and Person)  ?Thought Content:  WDL  ?Suicidal Thoughts:  No  ?Homicidal Thoughts:  No  ?Memory:  Immediate;   Good ?Recent;   Good ?Remote;   Good  ?Judgement:  Good  ?Insight:  Present  ?Psychomotor Activity:  NA  ?Concentration:  Concentration: Good and Attention Span: Good  ?Recall:  Good  ?Fund of Knowledge:  Good  ?Language:  Good  ?Akathisia:  No  ?Handed:  Right  ?AIMS (if indicated):     ?Assets:  Communication Skills ?Desire for Improvement ?Housing ?Resilience ?Social Support ?Transportation  ?ADL's:  Intact  ?Cognition:  WNL  ?Sleep:   good  ? ? ? ? ?Assessment and Plan: ?Major depressive disorder, recurrent.  Anxiety. ? ?Patient is  stable on her current medication.  She is excited but also nervous about upcoming trip to [place].  She does not want to have a panic attack as she recall  first major panic attack on the plane.  Reassurance given.  I recommend she can take the Klonopin before the traveling that helps her panic attack.  She agreed with the plan.  We will continue Lamictal 25 mg twice a day, amitriptyline 25 mg at bedtime and change Cymbalta 20 mg twice a day due to insurance reason.  Encouraged to continue therapy with Georgina Snell.  Continue Klonopin which she takes one fourth to half tablet as needed.  Recommended to call us back if she is any question or any concern.  Recently she had a blood work with her PCP.  Her labs are reviewed.  Follow-up in 3 months ? ?Follow Up Instructions: ? ?  ?I discussed the assessment and treatment plan with the patient. The patient was provided an opportunity to ask questions and all were answered. The patient agreed with the plan and demonstrated an understanding of the instructions. ?  ?The patient was advised to call back or seek an in-person evaluation if the symptoms worsen or if the condition fails to improve as anticipated. ? ?I provided 17 minutes of non-face-to-face time during this encounter. ? ? ?Kathlee Nations, MD  ?

## 2022-02-14 ENCOUNTER — Encounter: Payer: Self-pay | Admitting: Internal Medicine

## 2022-02-15 ENCOUNTER — Other Ambulatory Visit: Payer: Self-pay | Admitting: Internal Medicine

## 2022-02-15 DIAGNOSIS — J4520 Mild intermittent asthma, uncomplicated: Secondary | ICD-10-CM

## 2022-02-15 DIAGNOSIS — G4733 Obstructive sleep apnea (adult) (pediatric): Secondary | ICD-10-CM | POA: Diagnosis not present

## 2022-02-15 MED ORDER — ADVAIR HFA 115-21 MCG/ACT IN AERO: INHALATION_SPRAY | RESPIRATORY_TRACT | 3 refills | Status: DC

## 2022-02-17 ENCOUNTER — Other Ambulatory Visit: Payer: Self-pay | Admitting: Internal Medicine

## 2022-02-17 ENCOUNTER — Ambulatory Visit (HOSPITAL_COMMUNITY): Payer: PPO | Admitting: Licensed Clinical Social Worker

## 2022-02-17 DIAGNOSIS — E782 Mixed hyperlipidemia: Secondary | ICD-10-CM

## 2022-02-17 MED ORDER — ROSUVASTATIN CALCIUM 40 MG PO TABS: ORAL_TABLET | ORAL | 3 refills | Status: DC

## 2022-03-09 ENCOUNTER — Ambulatory Visit (HOSPITAL_COMMUNITY): Payer: PPO | Admitting: Licensed Clinical Social Worker

## 2022-03-09 DIAGNOSIS — F330 Major depressive disorder, recurrent, mild: Secondary | ICD-10-CM | POA: Diagnosis not present

## 2022-03-09 DIAGNOSIS — F411 Generalized anxiety disorder: Secondary | ICD-10-CM | POA: Diagnosis not present

## 2022-03-09 NOTE — Progress Notes (Signed)
THERAPIST PROGRESS NOTE ?  ?Session Time: 3:00pm - 4:00pm    ? ?Location: ?Patient: [facility] [place] [facility] ?Provider: [facility] [place] [facility] ?  ?Participation Level: Active  ?  ?Behavioral Response: Alert, casually dressed, depressed mood/affect  ?  ?Type of Therapy:  Individual Therapy ?  ?Treatment Goals addressed: Depression and anxiety management; Medication compliance ? ?Progress Towards Goals: Progressing ?  ?Interventions: CBT, problem solving, assertive communication and healthy boundaries  ?  ?Summary: Sarah Rodriguez is a 66 year old married Caucasian female on disability that presented for therapy appointment today with diagnoses of Major depressive disorder, recurrent, mild; and Generalized Anxiety Disorder.      ?  ?Suicidal/Homicidal: None; without intent or plan.   ?  ?Therapist Response:  Clinician met with Sarah Rodriguez for in person therapy session today, and assessed for safety, medication compliance, and sobriety.  Sarah Rodriguez presented for appointment on time, and was alert, oriented x5, with no evidence or self-report of active SI/HI or A/V H.  Sarah Rodriguez reported ongoing compliance with medication and denied use of alcohol or illicit substances. Clinician inquired about Sarah Rodriguez's emotional ratings today, as well as any significant changes in thoughts, feelings, or behavior since previous check-in.  Sarah Rodriguez reported scores of 6/10 for depression, 2/10 for anxiety, and 5/10 for anger/irritability.  Sarah denied any recent panic attacks or outbursts.  Sarah Rodriguez reported that a recent struggle has been communication with her husband, as he does little things that irritate her, and stated ?I've been biting my tongue and feeling frustrated?.  Clinician utilized a handout on boundaries with Sarah Rodriguez to guide discussion on this subject.  This included defining what boundaries are (i.e. the limits and rules we set for ourselves within relationships), ways to articulate boundaries to others in a respectful, assertive way, and tips for improving  approach, such as planning ahead regarding what to say and how to say it, seeking compromise when appropriate, and using confident body language.  Clinician also discussed various communication traps (i.e. criticisms, putdowns, sarcasm, etc) that should be avoided when expressing thoughts and feelings.  Intervention was effective, as evidenced by Sarah Rodriguez engaging in discussion on subject and expressing receptiveness to suggestions offered, reporting that Sarah would benefit from changing her approach to avoid appearing overly critical, stating ?Something I said to him the other day really set him off.  I need to make sure I praise him too when he does things right?.  Clinician and Sarah Rodriguez completed cost benefit analysis regarding whether to explore marriage counseling with the husband to strengthen the relationship.  Sarah Rodriguez reported that Sarah will discuss this need with the husband later this week, stating ?I can see now how that might help [place]?.  Clinician will continue to monitor.             ? ?Plan: Follow up for next session in 2 weeks.  ? ?Diagnosis: Major depressive disorder, recurrent, mild; and Generalized Anxiety Disorder. ? ?Collaboration of Care:   No collaboration of care required for this visit. ?                                                  ?Patient/Guardian was advised Release of Information must be obtained prior to any record release in order to collaborate their care with an outside provider. Patient/Guardian was advised if they have not already done so to  contact the registration department to sign all necessary forms in order for [place] to release information regarding their care.  ?  ?Consent: Patient/Guardian gives verbal consent for treatment and assignment of benefits for services provided during this visit. Patient/Guardian expressed understanding and agreed to proceed. ?  ?Shade Flood, LCSW, LCAS ?03/09/22 ?

## 2022-03-10 ENCOUNTER — Encounter: Payer: Self-pay | Admitting: Internal Medicine

## 2022-03-11 DIAGNOSIS — N952 Postmenopausal atrophic vaginitis: Secondary | ICD-10-CM | POA: Diagnosis not present

## 2022-03-11 DIAGNOSIS — Z124 Encounter for screening for malignant neoplasm of cervix: Secondary | ICD-10-CM | POA: Diagnosis not present

## 2022-03-11 DIAGNOSIS — Z780 Asymptomatic menopausal state: Secondary | ICD-10-CM | POA: Diagnosis not present

## 2022-03-11 DIAGNOSIS — N904 Leukoplakia of vulva: Secondary | ICD-10-CM | POA: Diagnosis not present

## 2022-03-11 DIAGNOSIS — Z113 Encounter for screening for infections with a predominantly sexual mode of transmission: Secondary | ICD-10-CM | POA: Diagnosis not present

## 2022-03-11 DIAGNOSIS — N3941 Urge incontinence: Secondary | ICD-10-CM | POA: Diagnosis not present

## 2022-03-11 DIAGNOSIS — Z01419 Encounter for gynecological examination (general) (routine) without abnormal findings: Secondary | ICD-10-CM | POA: Diagnosis not present

## 2022-03-11 DIAGNOSIS — Z6822 Body mass index (BMI) 22.0-22.9, adult: Secondary | ICD-10-CM | POA: Diagnosis not present

## 2022-03-11 DIAGNOSIS — Z01411 Encounter for gynecological examination (general) (routine) with abnormal findings: Secondary | ICD-10-CM | POA: Diagnosis not present

## 2022-03-11 DIAGNOSIS — Z1231 Encounter for screening mammogram for malignant neoplasm of breast: Secondary | ICD-10-CM | POA: Diagnosis not present

## 2022-03-11 LAB — HM MAMMOGRAPHY

## 2022-03-15 ENCOUNTER — Other Ambulatory Visit: Payer: Self-pay | Admitting: Internal Medicine

## 2022-03-15 ENCOUNTER — Encounter: Payer: Self-pay | Admitting: Internal Medicine

## 2022-03-30 ENCOUNTER — Ambulatory Visit (HOSPITAL_COMMUNITY): Payer: PPO | Admitting: Licensed Clinical Social Worker

## 2022-04-08 ENCOUNTER — Ambulatory Visit (INDEPENDENT_AMBULATORY_CARE_PROVIDER_SITE_OTHER): Payer: PPO | Admitting: Nurse Practitioner

## 2022-04-08 ENCOUNTER — Encounter: Payer: Self-pay | Admitting: Nurse Practitioner

## 2022-04-08 VITALS — BP 140/70 | HR 75 | Temp 97.7°F | Wt 112.6 lb

## 2022-04-08 DIAGNOSIS — J0100 Acute maxillary sinusitis, unspecified: Secondary | ICD-10-CM

## 2022-04-08 DIAGNOSIS — I10 Essential (primary) hypertension: Secondary | ICD-10-CM | POA: Diagnosis not present

## 2022-04-08 DIAGNOSIS — J4520 Mild intermittent asthma, uncomplicated: Secondary | ICD-10-CM

## 2022-04-08 MED ORDER — AZITHROMYCIN 250 MG PO TABS: ORAL_TABLET | ORAL | 1 refills | Status: DC

## 2022-04-08 MED ORDER — DEXAMETHASONE 0.5 MG PO TABS: ORAL_TABLET | ORAL | 0 refills | Status: DC

## 2022-04-08 NOTE — Progress Notes (Signed)
Assessment and Plan: ? ?Sarah Rodriguez was seen today for acute visit. ? ?Diagnoses and all orders for this visit: ? ?Essential hypertension ?- continue medications, DASH diet, exercise and monitor at home. Call if greater than 130/80.   ? ?Asthma ?Currently on Singulair and Advair with Albuterol as needed ?Will call Insurance to see which inhaler covered as advair is now too expensive ? ?Acute non-recurrent maxillary sinusitis ?Continue water, allergy medication and antihistamines ?If symptoms get worse notify the office ?-     azithromycin (ZITHROMAX) 250 MG tablet; Take 2 tablets (500 mg) on  Day 1,  followed by 1 tablet (250 mg) once daily on Days 2 through 5. ?-     dexamethasone (DECADRON) 0.5 MG tablet; take 1 tablet PO BID with food for 3 days, then take 1 tablet PO for 4 days. ? ?  ? ? ? ?Further disposition pending results of labs. Discussed med's effects and SE's.   ?Over 30 minutes of exam, counseling, chart review, and critical decision making was performed.  ? ?Future Appointments  ?Date Time Provider [place]  ?04/15/2022  2:00 PM Shade Flood [facility] None  ?05/13/2022  2:00 PM Arfeen, Arlyce Harman, MD [facility] None  ?09/30/2022  3:00 PM Unk Pinto, MD [facility] None  ?01/06/2023 11:00 AM Liane Comber, NP [facility] None  ? ? ?------------------------------------------------------------------------------------------------------------------ ? ? ?HPI ?BP 140/70   Pulse 75   Temp 97.7 ?F (36.5 ?C)   Wt 112 lb 9.6 oz (51.1 kg)   SpO2 96%   BMI 20.93 kg/m?  ? ?66 y.o.female presents for cough and congestion. She has had symptoms x 7 days. She is using neti pot and Flonase daily.  No drainage from nose, cough with occasional white mucus. She has had a sore throat for the past few days.  Denies body aches, fever, nausea, vomiting and diarrhea. ? ?She is currently on advair for asthma but insurance is not currently covering. She does use it with a space since she was getting recurrent mouth yeast  infections. Will call insurance to see which medications they will cover ? ?Currently well controlled without medication.  Denies headaches, chest pain, shortness of breath and dizziness ?BP Readings from Last 3 Encounters:  ?04/08/22 140/70  ?01/06/22 118/68  ?12/10/21 (!) 146/80  ? ? ? ?Past Medical History:  ?Diagnosis Date  ? Asthma   ? Dulera daily  ? Chronic back pain   ? HNP  ? Complication of anesthesia   ? slow to wake up  ? Depression   ? takes Clonazepam nightly  ? GERD (gastroesophageal reflux disease)   ? takes Protonix daily   ? Heart murmur   ? History of migraine   ? takes Relpax daily as needed  ? HSV (herpes simplex virus) infection   ? Hyperlipidemia   ? takes Atorvastatin daily  ? Hypertension   ? takes Benicar daily  ? Hypothyroidism   ? takes Synthroid daily  ? MVP (mitral valve prolapse)   ? Pneumonia   ? hx of--been many yrs ago  ? Seasonal allergies   ? takes Allegra daily  ? Shortness of breath   ? with exertion  ?  ? ?Allergies  ?Allergen Reactions  ? Demerol [Meperidine] Other (See Comments)  ?  REACTION: unknown--itching  ? Epinephrine Other (See Comments)  ?  "Sensitivity."  ? Niacin And Related Rash  ? ? ?Current Outpatient Medications on File Prior to Visit  ?Medication Sig  ? albuterol (PROAIR HFA) 108 (90 Base) MCG/ACT  inhaler Inhale 2 puffs into the lungs every 6 (six) hours as needed for wheezing or shortness of breath.  ? amitriptyline (ELAVIL) 25 MG tablet Take 1 tablet (25 mg total) by mouth at bedtime.  ? cetirizine (ZYRTEC) 10 MG tablet TAKE 1 TABLET(10 MG) BY MOUTH DAILY  ? Cholecalciferol (VITAMIN D PO) Take 4,000 Units by mouth daily.   ? CINNAMON PO Take 1,000 Units by mouth 2 (two) times daily.   ? clonazePAM (KLONOPIN) 0.5 MG tablet Take 1/2 to 1 tablet as needed for severe anxiety.  ? diclofenac sodium (VOLTAREN) 1 % GEL Apply 4 g topically 4 (four) times daily.  ? DULoxetine (CYMBALTA) 20 MG capsule Take 2 capsules (40 mg total) by mouth daily.  ? fluticasone  (FLONASE) 50 MCG/ACT nasal spray INSTILL 2 SPRAYS IN EACH NOSTRIL EVERY EVENING  ? fluticasone-salmeterol (ADVAIR HFA) 115-21 MCG/ACT inhaler Use  2 inhalations  15 minutes apart  2 x /day (every 12 hours)  ? lamoTRIgine (LAMICTAL) 25 MG tablet Take 1 tablet (25 mg total) by mouth 2 (two) times daily.  ? levothyroxine (SYNTHROID) 50 MCG tablet Take 1/2 tablet  Daily  on an empty stomach with only water for 30 minutes & no Antacid meds, Calcium or Magnesium for 4 hours & avoid Biotin  ? losartan (COZAAR) 50 MG tablet Take  1 tablet  Daily  for BP                                                                               /                  TAKE BY MOUTH  ? Magnesium 250 MG TABS Take 3 tablets by mouth daily.   ? meloxicam (MOBIC) 15 MG tablet Take  1 tablet   Daily  with Food for Pain & Inflammation  ? montelukast (SINGULAIR) 10 MG tablet TAKE 1 TABLET BY MOUTH DAILY FOR ALLERGIES  ? nystatin (MYCOSTATIN) 100000 UNIT/ML suspension SWISH AND SWALLOW 5 ML BY MOUTH FOUR TIMES DAILY FOR THRUSH OR YEAST INFECTION  ? pantoprazole (PROTONIX) 40 MG tablet Take  1 tablet  Daily  to Prevent Heartburn & Indigestion         /TAKE 1 TABLET BY MOUTH EVERY DAY  ? PREMARIN vaginal cream INSERT 1/2 GRAM VAGINALLY VIA APPLICATOR NIGHTLY FOR W EEKS THEN JUST DO TEICE A WEEK  ? rosuvastatin (CRESTOR) 40 MG tablet Take  1 tablet  2 x /week  for Cholesterol                                                      /                                 TAKE                  BY  MOUTH  ? Spacer/Aero-Holding Chambers (AEROCHAMBER MV) inhaler Use as instructed with inhaler.  ? ondansetron (ZOFRAN) 4 MG tablet Take 1 tablet (4 mg total) by mouth every 8 (eight) hours as needed. (Patient not taking: Reported on 04/08/2022)  ? pseudoephedrine (SUDAFED) 120 MG 12 hr tablet Take 1 tablet (120 mg total) by mouth 2 (two) times daily as needed for congestion. (Patient not taking: Reported on 04/08/2022)  ? ?No current facility-administered  medications on file prior to visit.  ? ? ?ROS: all negative except above.  ? ?Physical Exam: ? ?BP 140/70   Pulse 75   Temp 97.7 ?F (36.5 ?C)   Wt 112 lb 9.6 oz (51.1 kg)   SpO2 96%   BMI 20.93 kg/m?  ? ?General Appearance: Well nourished, in no apparent distress. ?Eyes: PERRLA, EOMs, conjunctiva no swelling or erythema ?Sinuses: + maxillary tenderness ?ENT/Mouth: Ext aud canals clear, TMs without erythema, bulging. No erythema, swelling, or exudate on post pharynx.  Tonsils not swollen or erythematous. Hearing normal.  ?Neck: Supple, thyroid normal.  ?Respiratory: Respiratory effort normal, BS equal bilaterally without rales, rhonchi, wheezing or stridor.  ?Cardio: RRR with no MRGs. Brisk peripheral pulses without edema.  ?Abdomen: Soft, + BS.  Non tender, no guarding, rebound, hernias, masses. ?Lymphatics: + submandibular adenopathy bilaterally ?Musculoskeletal: Full ROM, 5/5 strength, normal gait.  ?Skin: Warm, dry without rashes, lesions, ecchymosis.  ?Neuro: Cranial nerves intact. Normal muscle tone, no cerebellar symptoms. Sensation intact.  ?Psych: Awake and oriented X 3, normal affect, Insight and Judgment appropriate.  ?  ? ?Magda Bernheim, NP ?3:44 PM ?[facility] ? ?

## 2022-04-14 ENCOUNTER — Other Ambulatory Visit: Payer: Self-pay | Admitting: Adult Health

## 2022-04-14 DIAGNOSIS — J301 Allergic rhinitis due to pollen: Secondary | ICD-10-CM

## 2022-04-15 ENCOUNTER — Ambulatory Visit (HOSPITAL_COMMUNITY): Payer: PPO | Admitting: Licensed Clinical Social Worker

## 2022-04-15 DIAGNOSIS — F411 Generalized anxiety disorder: Secondary | ICD-10-CM | POA: Diagnosis not present

## 2022-04-15 DIAGNOSIS — F330 Major depressive disorder, recurrent, mild: Secondary | ICD-10-CM | POA: Diagnosis not present

## 2022-04-15 NOTE — Progress Notes (Signed)
THERAPIST PROGRESS NOTE   Session Time: 2:00pm - 3:00pm     Location: Patient: [facility] Provider: [facility]   Participation Level: Active    Behavioral Response: Alert, casually dressed, irritable mood/affect    Type of Therapy:  Individual Therapy   Treatment Goals addressed: Depression and anxiety management; Medication compliance  Progress Towards Goals: Progressing   Interventions: CBT, anger management    Summary: Jeanae Whitmill is a 66 year old married Caucasian female on disability that presented for therapy appointment today with diagnoses of Major depressive disorder, recurrent, mild; and Generalized Anxiety Disorder.        Suicidal/Homicidal: None; without intent or plan.     Therapist Response:  Clinician met with Lanice for in person therapy appointment today, and assessed for safety, medication compliance, and sobriety.  Tashaya presented for session on time, and was alert, oriented x5, with no evidence or self-report of active SI/HI or A/V H.  Leshonda reported ongoing compliance with medication and denied use of alcohol or illicit substances. Clinician inquired about Chrystina's current emotional ratings, as well as any significant changes in thoughts, feelings, or behavior since last check-in.  Jaiyana reported scores of 6/10 for depression, 8/10 for anxiety, and 7/10 for anger/irritability.  She denied any recent panic attacks or outbursts.  Jacari reported that a recent success was having mother's day go well, although she has been feeling more irritable, and her husband asked the other day why she has appeared so angry.  Clinician utilized an Naval architect with [employer] in session to assist.  This handout explained how anger tends to be an emotion that is easily identifiable due to outward behavior such as frequent emotional outbursts, but can conceal other difficult emotions under the surface if compartmentalization is used.  A list of common emotions linked to  anger was provided, and Zeina was tasked with identifying which ones could be having a present influence.  Clinician also encouraged Avarose to consider including various self-care activities in her weekly schedule which could offer an appropriate stress outlet and reduce conflict in the marriage.  Intervention was effective, as evidenced by Manuela Schwartz participating in discussion on handout, expressing understanding, and identifying several underlying emotions affecting overall mood, such as sadness, loneliness, frustration, and fear.  Raenette reported that a number of stressors have likely influenced this mood shift, such as her fear of travel, allergies worsening, hearing aid issues, and negative thoughts about her future.  Amaiyah reported that she could not think of self-care activities to explore at this time, but will revisit subject in future session.  Clinician will continue to monitor.              Plan: Follow up for next session in 2 weeks.   Diagnosis: Major depressive disorder, recurrent, mild; and Generalized Anxiety Disorder.  Collaboration of Care:   No collaboration of care required for this visit.                                                   Patient/Guardian was advised Release of Information must be obtained prior to any record release in order to collaborate their care with an outside provider. Patient/Guardian was advised if they have not already done so to contact the registration department to sign all necessary forms in order for [place] to release  information regarding their care.    Consent: Patient/Guardian gives verbal consent for treatment and assignment of benefits for services provided during this visit. Patient/Guardian expressed understanding and agreed to proceed.   Shade Flood, LCSW, LCAS 04/15/22

## 2022-04-21 DIAGNOSIS — N3941 Urge incontinence: Secondary | ICD-10-CM | POA: Diagnosis not present

## 2022-04-21 DIAGNOSIS — L900 Lichen sclerosus et atrophicus: Secondary | ICD-10-CM | POA: Diagnosis not present

## 2022-04-21 DIAGNOSIS — N952 Postmenopausal atrophic vaginitis: Secondary | ICD-10-CM | POA: Diagnosis not present

## 2022-05-04 ENCOUNTER — Telehealth: Payer: Self-pay

## 2022-05-04 NOTE — Telephone Encounter (Signed)
Prior auth for Fluticasone propionate inhaler approved through 11/28/22. LM for patient.

## 2022-05-13 ENCOUNTER — Encounter (HOSPITAL_COMMUNITY): Payer: Self-pay | Admitting: Psychiatry

## 2022-05-13 ENCOUNTER — Ambulatory Visit (HOSPITAL_COMMUNITY): Payer: PPO | Admitting: Psychiatry

## 2022-05-13 DIAGNOSIS — F331 Major depressive disorder, recurrent, moderate: Secondary | ICD-10-CM

## 2022-05-13 DIAGNOSIS — F411 Generalized anxiety disorder: Secondary | ICD-10-CM

## 2022-05-13 MED ORDER — AMITRIPTYLINE HCL 25 MG PO TABS: 25.0000 mg | ORAL_TABLET | Freq: Every day | ORAL | 0 refills | Status: DC

## 2022-05-13 MED ORDER — CLONAZEPAM 0.5 MG PO TABS: ORAL_TABLET | ORAL | 1 refills | Status: DC

## 2022-05-13 MED ORDER — DULOXETINE HCL 20 MG PO CPEP: 40.0000 mg | ORAL_CAPSULE | Freq: Every day | ORAL | 0 refills | Status: DC

## 2022-05-13 MED ORDER — LAMOTRIGINE 25 MG PO TABS: 25.0000 mg | ORAL_TABLET | Freq: Two times a day (BID) | ORAL | 0 refills | Status: DC

## 2022-05-13 NOTE — Progress Notes (Signed)
[facility] MD/PA/NP OP Progress Note   Patient Location: Office Provider Location: Office  05/13/2022 2:10 PM [facility]  MRN:  [id number]   HPI: Patient came in present for her follow-up appointment.  She still have anxiety, nervousness and scared to be alone.  She did not go to [place] with her husband.  She is not sure when it would happen but she is still very nervous and anxious.  Last week she has a panic attack and she took one fourth of Klonopin and talk to herself which helps.  She sleeps good.  She does not want to change the medication because she feels the combination of medicine is working very well.  She is taking low-dose of Lamictal, Cymbalta, amitriptyline and Klonopin.  We have discussed few times to consider stopping some medication and consolidating the dose of other medicine but patient is afraid to change the dose.  She sleeps most of the time okay but sometime having racing thoughts and like to go to sleep.  She lives with her husband and that is her bigger support.  She rarely go to outside by herself.  Few weeks ago she went to grocery store by herself and she got shock when she noticed things are very expensive.  She is in therapy with Georgina Snell.  She has no tremors, shakes or any EPS.  She denies any hallucination or any paranoia.  Her appetite is okay.  Her weight is stable.  Visit Diagnosis:    ICD-10-CM   1. MDD (major depressive disorder), recurrent episode, moderate (HCC)  F33.1 DULoxetine (CYMBALTA) 20 MG capsule    lamoTRIgine (LAMICTAL) 25 MG tablet    clonazePAM (KLONOPIN) 0.5 MG tablet    amitriptyline (ELAVIL) 25 MG tablet    2. GAD (generalized anxiety disorder)  F41.1 DULoxetine (CYMBALTA) 20 MG capsule    clonazePAM (KLONOPIN) 0.5 MG tablet    amitriptyline (ELAVIL) 25 MG tablet      Past Psychiatric History:  H/O depression and taking antidepressants since 1996.  H/O panic attacks while driving.  Saw Dr. Letta Moynahan and tried Prozac, Paxil,  Zoloft, Xanax, Cymbalta and Klonopin.  No h/o inpatient treatment or any suicidal attempt.  Past Medical History:  Past Medical History:  Diagnosis Date   Asthma    Dulera daily   Chronic back pain    HNP   Complication of anesthesia    slow to wake up   Depression    takes Clonazepam nightly   GERD (gastroesophageal reflux disease)    takes Protonix daily    Heart murmur    History of migraine    takes Relpax daily as needed   HSV (herpes simplex virus) infection    Hyperlipidemia    takes Atorvastatin daily   Hypertension    takes Benicar daily   Hypothyroidism    takes Synthroid daily   MVP (mitral valve prolapse)    Pneumonia    hx of--been many yrs ago   Seasonal allergies    takes Allegra daily   Shortness of breath    with exertion    Past Surgical History:  Procedure Laterality Date   ABLATION  2011   Uterine   buttocks surgery     at age 56 (coccyx repair)   CERVICAL FUSION  03/2010   CESAREAN SECTION  1992   CHOLECYSTECTOMY  2008   COLONOSCOPY     DILATION AND CURETTAGE OF UTERUS  2011   ESOPHAGOGASTRODUODENOSCOPY     LUMBAR LAMINECTOMY/DECOMPRESSION  MICRODISCECTOMY  10/24/2012   Procedure: LUMBAR LAMINECTOMY/DECOMPRESSION MICRODISCECTOMY 1 LEVEL;  Surgeon: Kristeen Miss, MD;  Location: [facility];  Service: Neurosurgery;  Laterality: Right;  Right Lumbar five-Sacral One Microdiskectomy   right knee arthroscopy     x 3   TONSILLECTOMY     at age 21    Family Psychiatric History: Reviewed.  Family History:  Family History  Problem Relation Age of Onset   Arthritis Mother    Heart attack Mother    Depression Mother    Allergies Mother    Hypertension Mother    Heart attack Father     Social History:  Social History   Socioeconomic History   Marital status: Married    Spouse name: Not on file   Number of children: Not on file   Years of education: Not on file   Highest education level: Not on file  Occupational History   Not on file   Tobacco Use   Smoking status: Former   Smokeless tobacco: Never   Tobacco comments:    quit at age 36  Vaping Use   Vaping Use: Never used  Substance and Sexual Activity   Alcohol use: No    Comment: 2-3 times a yr   Drug use: No   Sexual activity: Yes    Birth control/protection: Post-menopausal  Other Topics Concern   Not on file  Social History Narrative   Not on file   Social Determinants of Health   Financial Resource Strain: Not on file  Food Insecurity: Not on file  Transportation Needs: Not on file  Physical Activity: Not on file  Stress: Not on file  Social Connections: Not on file    Allergies:  Allergies  Allergen Reactions   Demerol [Meperidine] Other (See Comments)    REACTION: unknown--itching   Epinephrine Other (See Comments)    "Sensitivity."   Niacin And Related Rash    Metabolic Disorder Labs: Lab Results  Component Value Date   HGBA1C 5.6 09/30/2021   MPG 114 09/30/2021   MPG 117 04/16/2021   No results found for: "PROLACTIN" Lab Results  Component Value Date   CHOL 173 01/06/2022   TRIG 180 (H) 01/06/2022   HDL 47 (L) 01/06/2022   CHOLHDL 3.7 01/06/2022   VLDL 31 (H) 06/06/2017   LDLCALC 98 01/06/2022   LDLCALC 78 09/30/2021   Lab Results  Component Value Date   TSH 1.96 01/06/2022   TSH 1.27 09/30/2021    Therapeutic Level Labs: No results found for: "LITHIUM" No results found for: "VALPROATE" No results found for: "CBMZ"  Current Medications: Current Outpatient Medications  Medication Sig Dispense Refill   albuterol (PROAIR HFA) 108 (90 Base) MCG/ACT inhaler Inhale 2 puffs into the lungs every 6 (six) hours as needed for wheezing or shortness of breath. 8 g 1   amitriptyline (ELAVIL) 25 MG tablet Take 1 tablet (25 mg total) by mouth at bedtime. 90 tablet 0   azithromycin (ZITHROMAX) 250 MG tablet Take 2 tablets (500 mg) on  Day 1,  followed by 1 tablet (250 mg) once daily on Days 2 through 5. 6 each 1   cetirizine  (ZYRTEC) 10 MG tablet TAKE 1 TABLET(10 MG) BY MOUTH DAILY 90 tablet 1   Cholecalciferol (VITAMIN D PO) Take 4,000 Units by mouth daily.      CINNAMON PO Take 1,000 Units by mouth 2 (two) times daily.      clonazePAM (KLONOPIN) 0.5 MG tablet Take 1/2 to 1  tablet as needed for severe anxiety. 30 tablet 1   dexamethasone (DECADRON) 0.5 MG tablet take 1 tablet PO BID with food for 3 days, then take 1 tablet PO for 4 days. 10 tablet 0   diclofenac sodium (VOLTAREN) 1 % GEL Apply 4 g topically 4 (four) times daily. 100 g 3   DULoxetine (CYMBALTA) 20 MG capsule Take 2 capsules (40 mg total) by mouth daily. 180 capsule 0   fluticasone (FLONASE) 50 MCG/ACT nasal spray SHAKE LIQUID AND USE 2 SPRAYS IN EACH NOSTRIL EVERY EVENING 48 g 3   fluticasone-salmeterol (ADVAIR HFA) 115-21 MCG/ACT inhaler Use  2 inhalations  15 minutes apart  2 x /day (every 12 hours) 36 g 3   lamoTRIgine (LAMICTAL) 25 MG tablet Take 1 tablet (25 mg total) by mouth 2 (two) times daily. 180 tablet 0   levothyroxine (SYNTHROID) 50 MCG tablet Take 1/2 tablet  Daily  on an empty stomach with only water for 30 minutes & no Antacid meds, Calcium or Magnesium for 4 hours & avoid Biotin 90 tablet 1   losartan (COZAAR) 50 MG tablet Take  1 tablet  Daily  for BP                                                                               /                  TAKE BY MOUTH 90 tablet 3   Magnesium 250 MG TABS Take 3 tablets by mouth daily.      meloxicam (MOBIC) 15 MG tablet Take  1 tablet   Daily  with Food for Pain & Inflammation 90 tablet 3   montelukast (SINGULAIR) 10 MG tablet TAKE 1 TABLET BY MOUTH DAILY FOR ALLERGIES 90 tablet 3   nystatin (MYCOSTATIN) 100000 UNIT/ML suspension SWISH AND SWALLOW 5 ML BY MOUTH FOUR TIMES DAILY FOR THRUSH OR YEAST INFECTION 480 mL 1   ondansetron (ZOFRAN) 4 MG tablet Take 1 tablet (4 mg total) by mouth every 8 (eight) hours as needed. (Patient not taking: Reported on 04/08/2022) 20 tablet 0   pantoprazole  (PROTONIX) 40 MG tablet Take  1 tablet  Daily  to Prevent Heartburn & Indigestion         /TAKE 1 TABLET BY MOUTH EVERY DAY 90 tablet 3   PREMARIN vaginal cream INSERT 1/2 GRAM VAGINALLY VIA APPLICATOR NIGHTLY FOR W EEKS THEN JUST DO TEICE A WEEK  11   pseudoephedrine (SUDAFED) 120 MG 12 hr tablet Take 1 tablet (120 mg total) by mouth 2 (two) times daily as needed for congestion. (Patient not taking: Reported on 04/08/2022) 20 tablet 2   rosuvastatin (CRESTOR) 40 MG tablet Take  1 tablet  2 x /week  for Cholesterol                                                      /  TAKE                  BY                MOUTH 26 tablet 3   Spacer/Aero-Holding Chambers (AEROCHAMBER MV) inhaler Use as instructed with inhaler. 1 each 0   No current facility-administered medications for this visit.     Musculoskeletal: Strength & Muscle Tone: within normal limits Gait & Station: normal Patient leans: N/A  Psychiatric Specialty Exam: Review of Systems  Blood pressure (!) 175/89, pulse 75, resp. rate 18, height '5\' 1"'$  (1.549 m), weight 111 lb 3.2 oz (50.4 kg), SpO2 95 %.There is no height or weight on file to calculate BMI.  General Appearance: Casual  Eye Contact:  Good  Speech:  Clear and Coherent and Normal Rate  Volume:  Normal  Mood:  Anxious  Affect:  Appropriate  Thought Process:  Goal Directed  Orientation:  Full (Time, Place, and Person)  Thought Content: Logical   Suicidal Thoughts:  No  Homicidal Thoughts:  No  Memory:  Immediate;   Good Recent;   Good Remote;   Good  Judgement:  Good  Insight:  Good  Psychomotor Activity:  Normal  Concentration:  Concentration: Fair and Attention Span: Fair  Recall:  Good  Fund of Knowledge: Good  Language: Good  Akathisia:  No  Handed:  Right  AIMS (if indicated): not done  Assets:  Communication Skills Desire for Improvement Housing Social Support Transportation  ADL's:  Intact  Cognition: WNL  Sleep:   fair   but able to get up to 7 hrs   Screenings: GAD-7    Flowsheet Row Counselor from 05/07/2021 in [facility]  Total GAD-7 Score 8      PHQ2-9    [facility] Office Visit from 01/06/2022 in [facility] Office Visit from 09/30/2021 in [facility] Counselor from 05/07/2021 in [facility] Video Visit from 02/03/2021 in [facility] Office Visit from 01/06/2021 in [facility]  PHQ-2 Total Score 1 0 4 0 3  PHQ-9 Total Score -- -- 8 -- 10      Flowsheet Row Counselor from 05/07/2021 in [facility] Video Visit from 05/05/2021 in [facility] No Risk No Risk        Assessment and Plan: Major depressive disorder, recurrent.  Anxiety.  Patient did not go to [place] and she is not sure when that trip will happen.  She has told if she need to travel then she can take the [place] before her flying.  Discussed again about adjusting her medication but patient reluctant and does not want to change.  We will continue Lamictal 25 mg twice a day, amitriptyline 25 mg at bedtime, Cymbalta 20 mg 2 times a day as her insurance does not approve 40 mg a day and Klonopin 0.5 mg which she take one fourth as needed.  Encouraged to continue therapy with Georgina Snell.  Recommended to call us back if there is any question of any concern.  Follow-up in 3 months.  Collaboration of Care: Collaboration of Care: Primary Care Provider AEB notes are available in epic to review.  Patient/Guardian was advised Release of Information must be obtained prior to any record release in order to collaborate their care with an outside provider. Patient/Guardian was advised if they have not  already done so to contact the registration department  to sign all necessary forms in order for [place] to release information regarding their care.   Consent: Patient/Guardian gives verbal consent for treatment and assignment of benefits for services provided during this visit. Patient/Guardian expressed understanding and agreed to proceed.    Kathlee Nations, MD 05/13/2022, 2:10 PM

## 2022-05-18 ENCOUNTER — Ambulatory Visit (HOSPITAL_COMMUNITY): Payer: PPO | Admitting: Licensed Clinical Social Worker

## 2022-05-18 DIAGNOSIS — F411 Generalized anxiety disorder: Secondary | ICD-10-CM | POA: Diagnosis not present

## 2022-05-18 DIAGNOSIS — F330 Major depressive disorder, recurrent, mild: Secondary | ICD-10-CM
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
## 2022-05-19 ENCOUNTER — Other Ambulatory Visit: Payer: Self-pay | Admitting: Internal Medicine

## 2022-05-31 DIAGNOSIS — R42 Dizziness and giddiness: Secondary | ICD-10-CM | POA: Diagnosis not present

## 2022-05-31 DIAGNOSIS — H9313 Tinnitus, bilateral: Secondary | ICD-10-CM | POA: Diagnosis not present

## 2022-05-31 DIAGNOSIS — J310 Chronic rhinitis: Secondary | ICD-10-CM | POA: Diagnosis not present

## 2022-05-31 DIAGNOSIS — H903 Sensorineural hearing loss, bilateral: Secondary | ICD-10-CM | POA: Diagnosis not present

## 2022-05-31 DIAGNOSIS — H6981 Other specified disorders of Eustachian tube, right ear: Secondary | ICD-10-CM | POA: Diagnosis not present

## 2022-05-31 DIAGNOSIS — J343 Hypertrophy of nasal turbinates: Secondary | ICD-10-CM | POA: Diagnosis not present

## 2022-05-31 DIAGNOSIS — J342 Deviated nasal septum: Secondary | ICD-10-CM | POA: Diagnosis not present

## 2022-06-10 ENCOUNTER — Ambulatory Visit (HOSPITAL_COMMUNITY): Payer: PPO | Admitting: Licensed Clinical Social Worker

## 2022-06-10 DIAGNOSIS — F330 Major depressive disorder, recurrent, mild: Secondary | ICD-10-CM | POA: Diagnosis not present

## 2022-06-10 DIAGNOSIS — F411 Generalized anxiety disorder: Secondary | ICD-10-CM

## 2022-06-10 NOTE — Progress Notes (Signed)
THERAPIST PROGRESS NOTE   Session Time: 3:00pm - 4:00pm      Location: Patient: [facility] Provider: [facility]   Participation Level: Active    Behavioral Response: Alert, casually dressed, depressed mood/affect    Type of Therapy:  Individual Therapy   Treatment Goals addressed: Depression and anxiety management; Medication compliance  Progress Towards Goals: Progressing   Interventions: CBT: challenging anxious thoughts, problem solving    Summary: Venera Privott is a 66 year old married Caucasian female on disability that presented for therapy appointment today with diagnoses of Major depressive disorder, recurrent, mild; and Generalized Anxiety Disorder.        Suicidal/Homicidal: None; without intent or plan.     Therapist Response:  Clinician met with Dennette for in person therapy session today, and assessed for safety, medication compliance, and sobriety.  Khaniyah presented for appointment on time, and was alert, oriented x5, with no evidence or self-report of active SI/HI or A/V H.  Yentl reported ongoing compliance with medication and denied use of alcohol or illicit substances. Clinician inquired about Kymoni's emotional ratings today, as well as any significant changes in thoughts, feelings, or behavior since previous check-in.  Undra reported scores of 4/10 for depression, 4/10 for anxiety, and 2/10 for anger/irritability.  She denied any recent panic attacks or outbursts.  Mariette reported that a recent success was making an appointment to see a hearing specialist about her hearing aids, since this can be a barrier to socializing with groups of people.  [place] reported that one ongoing struggle is trying to decide whether to accompany her husband on the trip to [place], or stay home, as both options make her anxious.  Clinician assisted Taneika in running a cost benefit analysis regarding decision to go on this trip or stay home.  Clinician also utilized handout in session  today titled "Worry exploration" in order to assist Milla in reducing her anxiety related to both decisions.  This worksheet featured a series of Socratic questions aimed at exploring the most likely outcomes for a situation of concern, rather than focusing on the worst possible outcome (i.e. catastrophizing).  Clinician assisted Gizelle in identifying and challenging any irrational beliefs related to this worry, in addition to utilizing problem solving approach to explore strategies which would help her accomplish short term goals.  Intervention were effective, as evidenced by Manuela Schwartz participating in both activities, and reporting that this helped her realize that she really would prefer not to go on the trip since she does not feel as prepared to cope, and would rather have time for her own self-care activities.  Faviola reported that there is little evidence to suggest that anything bad will happen if she stays home alone since she accomplished this on her husband's most recent trip, and plenty of strategies for distracting herself.  Ceniya reported that she will plan to sit down with her husband this week to express her feelings on the matter.  Clinician will continue to monitor.              Plan: Follow up for next session in 1 month.   Diagnosis: Major depressive disorder, recurrent, mild; and Generalized Anxiety Disorder.  Collaboration of Care:   No collaboration of care required for this visit.  Patient/Guardian was advised Release of Information must be obtained prior to any record release in order to collaborate their care with an outside provider. Patient/Guardian was advised if they have not already done so to contact the registration department to sign all necessary forms in order for [place] to release information regarding their care.    Consent: Patient/Guardian gives verbal consent for treatment and assignment of benefits for services provided during  this visit. Patient/Guardian expressed understanding and agreed to proceed.   Shade Flood, LCSW, LCAS 06/10/22

## 2022-06-17 ENCOUNTER — Encounter: Payer: Self-pay | Admitting: Nurse Practitioner

## 2022-06-17 ENCOUNTER — Other Ambulatory Visit: Payer: Self-pay | Admitting: Nurse Practitioner

## 2022-06-17 DIAGNOSIS — J4520 Mild intermittent asthma, uncomplicated: Secondary | ICD-10-CM

## 2022-06-17 MED ORDER — FLUTICASONE-SALMETEROL 115-21 MCG/ACT IN AERO: INHALATION_SPRAY | RESPIRATORY_TRACT | 3 refills | Status: DC

## 2022-07-05 ENCOUNTER — Other Ambulatory Visit: Payer: Self-pay | Admitting: Internal Medicine

## 2022-07-05 DIAGNOSIS — J4520 Mild intermittent asthma, uncomplicated: Secondary | ICD-10-CM

## 2022-07-05 MED ORDER — FLUTICASONE-SALMETEROL 115-21 MCG/ACT IN AERO: INHALATION_SPRAY | RESPIRATORY_TRACT | 3 refills | Status: DC

## 2022-07-15 DIAGNOSIS — R42 Dizziness and giddiness: Secondary | ICD-10-CM | POA: Diagnosis not present

## 2022-07-22 DIAGNOSIS — H903 Sensorineural hearing loss, bilateral: Secondary | ICD-10-CM | POA: Diagnosis not present

## 2022-07-22 DIAGNOSIS — R42 Dizziness and giddiness: Secondary | ICD-10-CM | POA: Diagnosis not present

## 2022-07-22 DIAGNOSIS — H832X3 Labyrinthine dysfunction, bilateral: Secondary | ICD-10-CM | POA: Diagnosis not present

## 2022-08-05 ENCOUNTER — Ambulatory Visit (HOSPITAL_COMMUNITY): Payer: PPO | Admitting: Licensed Clinical Social Worker

## 2022-08-05 DIAGNOSIS — F330 Major depressive disorder, recurrent, mild: Secondary | ICD-10-CM | POA: Diagnosis not present

## 2022-08-05 DIAGNOSIS — F411 Generalized anxiety disorder: Secondary | ICD-10-CM | POA: Diagnosis not present

## 2022-08-05 NOTE — Progress Notes (Signed)
THERAPIST PROGRESS NOTE   Session Time: 2:00pm - 3:00pm   Location: Patient: [facility] Provider: [facility]   Participation Level: Active    Behavioral Response: Alert, casually dressed, depressed mood/affect    Type of Therapy:  Individual Therapy   Treatment Goals addressed: Depression and anxiety management; Medication compliance; Attending bible study weekly   Progress Towards Goals: Progressing   Interventions: CBT, psychoeducation on boundaries    Summary: Sarah Rodriguez is a 66 year old married Caucasian female on disability that presented for therapy appointment today with diagnoses of Major depressive disorder, recurrent, mild; and Generalized Anxiety Disorder.        Suicidal/Homicidal: None; without intent or plan.     Therapist Response:  Clinician met with Sarah Rodriguez for in person therapy appointment today, and assessed for safety, medication compliance, and sobriety.  Sarah Rodriguez presented for session on time, and was alert, oriented x5, with no evidence or self-report of active SI/HI or A/V H.  Sarah Rodriguez reported ongoing compliance with medication and denied use of alcohol or illicit substances. Clinician inquired about Sarah Rodriguez current emotional ratings, as well as any significant changes in thoughts, feelings, or behavior since last check-in.  Sarah Rodriguez reported scores of 7/10 for depression, 4/10 for anxiety, and 6/10 for anger/irritability.  She denied any recent panic attacks or outbursts.  Sarah Rodriguez reported that a recent success was going to the beach with her husband, stating "It was wonderful". She reported that she has also begun attending bible study at the church again, stating "The people there were very nice and welcoming".  Julian reported that her daughter recently disclosed that she is pregnant, but this news was delivered in a very guarded way which hurt her feelings, stating "She told me not to talk about it with anyone, and just be happy for them".  Clinician reviewed  psychoeducation on subject of boundaries with Sarah Rodriguez today using a handout.  This handout defined boundaries as the limits and rules that we set for ourselves within relationships, and featured a breakdown of the 3 common categories of boundaries (i.e. porous, rigid, and healthy), along with typical traits specific to each one for easy identification.  It was noted that most people have a mixture of different boundary types depending on setting, person, and culture.  Clinician tasked Sarah Rodriguez with identifying which boundary type her daughter is currently displaying, comparing this to their past relationship, her own boundary development, and communication skills that could be utilized to strengthen the relationship without overstepping boundaries.  Intervention was effective, as evidenced by Sarah Rodriguez's active engagement in discussion on subject, reporting that she and her daughter had a very codependent relationship when she was younger, and Sarah Rodriguez maintained porous boundaries during that time since she had trouble saying "No" to her daughter, was overinvolved in her problems, and dependent on maintaining a helpful presence, even if she neglected her own needs.  Sarah Rodriguez reported that she is motivated to respect her daughters more rigid boundaries in adulthood and avoid pushing her to get closer during the pregnancy, stating "We had inappropriate boundaries since she was around 27, and I know she is changing now and more independent".  Clinician will continue to monitor.              Plan: Follow up for next session in 1 month.   Diagnosis: Major depressive disorder, recurrent, mild; and Generalized Anxiety Disorder.  Collaboration of Care:   No collaboration of care required for this visit.  Patient/Guardian was advised Release of Information must be obtained prior to any record release in order to collaborate their care with an outside provider. Patient/Guardian was  advised if they have not already done so to contact the registration department to sign all necessary forms in order for [place] to release information regarding their care.    Consent: Patient/Guardian gives verbal consent for treatment and assignment of benefits for services provided during this visit. Patient/Guardian expressed understanding and agreed to proceed.   Shade Flood, LCSW, LCAS 08/05/22

## 2022-08-11 NOTE — Therapy (Signed)
OUTPATIENT PHYSICAL THERAPY VESTIBULAR EVALUATION     Patient Name: Sarah Rodriguez MRN: [id number] DOB:May 13, 1956, 66 y.o., female Today's Date: 08/12/2022  PCP: Unk Pinto, MD REFERRING PROVIDER: Raylene Miyamoto, MD   PT End of Session - 08/12/22 1231     Visit Number 1    Number of Visits 13    Date for PT Re-Evaluation 09/23/22    Authorization Type [employer]    PT Start Time 1148    PT Stop Time 1230    PT Time Calculation (min) 42 min    Equipment Utilized During Treatment Gait belt    Activity Tolerance Patient tolerated treatment well    Behavior During Therapy WFL for tasks assessed/performed;Agitated             Past Medical History:  Diagnosis Date   Asthma    Dulera daily   Chronic back pain    HNP   Complication of anesthesia    slow to wake up   Depression    takes Clonazepam nightly   GERD (gastroesophageal reflux disease)    takes Protonix daily    Heart murmur    History of migraine    takes Relpax daily as needed   HSV (herpes simplex virus) infection    Hyperlipidemia    takes Atorvastatin daily   Hypertension    takes Benicar daily   Hypothyroidism    takes Synthroid daily   MVP (mitral valve prolapse)    Pneumonia    hx of--been many yrs ago   Seasonal allergies    takes Allegra daily   Shortness of breath    with exertion   Past Surgical History:  Procedure Laterality Date   ABLATION  2011   Uterine   buttocks surgery     at age 72 (coccyx repair)   CERVICAL FUSION  03/2010   CESAREAN SECTION  1992   CHOLECYSTECTOMY  2008   COLONOSCOPY     DILATION AND CURETTAGE OF UTERUS  2011   ESOPHAGOGASTRODUODENOSCOPY     LUMBAR LAMINECTOMY/DECOMPRESSION MICRODISCECTOMY  10/24/2012   Procedure: LUMBAR LAMINECTOMY/DECOMPRESSION MICRODISCECTOMY 1 LEVEL;  Surgeon: Kristeen Miss, MD;  Location: [facility];  Service: Neurosurgery;  Laterality: Right;  Right Lumbar five-Sacral One Microdiskectomy   right knee arthroscopy     x 3    TONSILLECTOMY     at age 66   Patient Active Problem List   Diagnosis Date Noted   Sleep apnea 06/12/2020   Deviated septum 03/14/2018   Chronic back pain 12/21/2017   Depression, major, recurrent, in partial remission ([place]) 08/05/2016   Degenerative joint disease 08/05/2016   Migraine 03/03/2016   Spondylolisthesis at L4-L5 level 10/27/2015   Other abnormal glucose (hx of prediabetes) 12/12/2014   Vitamin D deficiency 04/17/2014   Medication management 04/17/2014   Hyperlipidemia, mixed    Hypertension    Asthma    GERD (gastroesophageal reflux disease)    Hypothyroidism     ONSET DATE: couple months ago  REFERRING DIAG: R42 (ICD-10-CM) - Dizziness and giddiness  THERAPY DIAG:  Unsteadiness on feet  Muscle weakness (generalized)  Rationale for Evaluation and Treatment Rehabilitation  SUBJECTIVE:   SUBJECTIVE STATEMENT: Patient reports that she started to have more trouble with her balance years ago which improved with strength training. In the past couple of months she started to notice more trouble with balance again. Notes imbalance walking on uneven surfaces and trouble running when playing pickleball. Around this time she noticed sudden worsening  of her hearing B and popping in the R ear. When she saw ENT they told her that she had more trouble with proprioception rather than dizziness. Also reports loss of muscle tone in B LEs recently but denies worsening of back pain or nerve pain in LEs with onset of muscle loss. Notes occasional R LE nerve pain since her back surgery.  Pt accompanied by: self  PERTINENT HISTORY: asthma, depression, GERD, migraine, HLD, HTN, cervical fusion 2011, L5-S1 lami 2013, R knee scope x3   PAIN:  Are you having pain? No  PRECAUTIONS: Fall  WEIGHT BEARING RESTRICTIONS No  FALLS: Has patient fallen in last 6 months? No  LIVING ENVIRONMENT: Lives with: lives with their spouse Lives in: House/apartment Stairs:  5 + 2 stairs to enter  and with bedroom on 2nd story Has following equipment at home: None  PLOF: Independent; on disability since age 86  PATIENT GOALS improve balance and LE strength  OBJECTIVE:   DIAGNOSTIC FINDINGS: none  COGNITION: Overall cognitive status: Within functional limits for tasks assessed   SENSATION: WFL  POSTURE: forward head; posterior weight shift in standing   LOWER EXTREMITY ROM:     Active  Right eval Left eval  Hip flexion    Hip extension    Hip abduction    Hip adduction    Hip internal rotation    Hip external rotation    Knee flexion    Knee extension    Ankle dorsiflexion 13 10  Ankle plantarflexion    Ankle inversion    Ankle eversion     (Blank rows = not tested)   LOWER EXTREMITY MMT:    MMT Right eval Left eval  Hip flexion 4+ 4+  Hip extension    Hip abduction 4- 4-  Hip adduction 4 4  Hip internal rotation    Hip external rotation    Knee flexion 3+ 4+  Knee extension 4+ 4+  Ankle dorsiflexion 4+ 4+  Ankle plantarflexion 4+ (19 reps) 4+ (15 reps)  Ankle inversion    Ankle eversion     (Blank rows = not tested)    GAIT: Gait pattern: step through pattern Assistive device utilized: None Level of assistance: Complete Independence   FUNCTIONAL TESTs:   [facility] PT Assessment - 08/12/22 0001       Functional Gait  Assessment   Gait assessed  Yes    Gait Level Surface Walks 20 ft in less than 5.5 sec, no assistive devices, good speed, no evidence for imbalance, normal gait pattern, deviates no more than 6 in outside of the 12 in walkway width.    Change in Gait Speed Able to change speed, demonstrates mild gait deviations, deviates 6-10 in outside of the 12 in walkway width, or no gait deviations, unable to achieve a major change in velocity, or uses a change in velocity, or uses an assistive device.    Gait with Horizontal Head Turns Performs head turns smoothly with slight change in gait velocity (eg, minor disruption to smooth gait path),  deviates 6-10 in outside 12 in walkway width, or uses an assistive device.    Gait with Vertical Head Turns Performs head turns with no change in gait. Deviates no more than 6 in outside 12 in walkway width.    Gait and Pivot Turn Pivot turns safely within 3 sec and stops quickly with no loss of balance.    Step Over Obstacle Is able to step over 2 stacked shoe boxes taped  together (9 in total height) without changing gait speed. No evidence of imbalance.    Gait with Narrow Base of Support Ambulates 4-7 steps.    Gait with Eyes Closed Walks 20 ft, slow speed, abnormal gait pattern, evidence for imbalance, deviates 10-15 in outside 12 in walkway width. Requires more than 9 sec to ambulate 20 ft.    Ambulating Backwards Walks 20 ft, no assistive devices, good speed, no evidence for imbalance, normal gait    Steps Alternating feet, must use rail.    Total Score 23               M-CTSIB  Condition 1: Firm Surface, EO 30 Sec, Normal Sway  Condition 2: Firm Surface, EC 30 Sec, Normal and Mild Sway  Condition 3: Foam Surface, EO 30 Sec, Moderate Sway  Condition 4: Foam Surface, EC 30 Sec, Moderate and Severe Sway     PATIENT SURVEYS:  FOTO NT d/t patient declining dizziness   HOME EXERCISE PROGRAM Access Code: [email or web address] URL: [email or web address] Date: 08/12/2022 Prepared by: [facility]  Exercises - Sidelying Hip Abduction  - 1 x daily - 5 x weekly - 2 sets - 10 reps - Sidelying Hip Adduction  - 1 x daily - 5 x weekly - 2 sets - 10 reps - Standing Hamstring Curl with Resistance  - 1 x daily - 5 x weekly - 2 sets - 10 reps - Squat with Chair Touch  - 1 x daily - 5 x weekly - 2 sets - 10 reps - Romberg Stance on Foam Pad  - 1 x daily - 5 x weekly - 2-3 sets - 30 sec hold - Romberg Stance Eyes Closed on Foam Pad  - 1 x daily - 5 x weekly - 2-3 sets - 30 sec hold    PATIENT EDUCATION: Education details: prognosis, POC,  HEP Person educated: Patient Education method: Explanation, Demonstration, Tactile cues, Verbal cues, and Handouts Education comprehension: verbalized understanding and returned demonstration   GOALS: Goals reviewed with patient? Yes  SHORT TERM GOALS: Target date: 09/02/2022  Patient to be independent with initial HEP. Baseline: HEP initiated Goal status: INITIAL  LONG TERM GOALS: Target date: 09/23/2022  Patient to be independent with advanced HEP. Baseline: Not yet initiated  Goal status: INITIAL  Patient to score at least 4/5 with LE MMT.  Baseline: see above Goal status: INITIAL  Patient to demonstrate mild sway with M-CTSIB condition with eyes closed/foam surface in order to improve safety in environments with uneven surfaces and dim lighting.  Baseline: mod-severe Goal status: INITIAL  Patient to score at least 25/30 on FGA in order to decrease risk of falls.  Baseline: 23 Goal status: INITIAL    ASSESSMENT:  CLINICAL IMPRESSION:   Patient is a 66 y/o F presenting to OPPT with c/o imbalance and LE weakness for the past couple months. Reports imbalance walking on uneven surfaces and trouble running when playing pickleball. Also reports sudden worsening of her hearing B, popping in the R ear, loss of muscle tone in B Les. Denies worsening of back pain or nerve pain in LEs with onset of muscle loss. Notes occasional R LE nerve pain since her back surgery. Patient was educated on gentle strengthening and balance HEP and reported understanding. Would benefit from skilled PT services 1-2x/week for 6 weeks to address aforementioned impairments in order to optimize level of function.    OBJECTIVE IMPAIRMENTS decreased balance, decreased strength, postural dysfunction,  and pain.   ACTIVITY LIMITATIONS carrying, lifting, bending, squatting, and stairs  PARTICIPATION LIMITATIONS: meal prep, cleaning, laundry, shopping, community activity, and yard work  PERSONAL FACTORS  Age, Past/current experiences, Time since onset of injury/illness/exacerbation, and 3+ comorbidities: asthma, depression, GERD, migraine, HLD, HTN, cervical fusion 2011, L5-S1 lami 2013, R knee scope x3  are also affecting patient's functional outcome.   REHAB POTENTIAL: Good  CLINICAL DECISION MAKING: Evolving/moderate complexity  EVALUATION COMPLEXITY: Moderate   PLAN: PT FREQUENCY: 1-2x/week  PT DURATION: 6 weeks  PLANNED INTERVENTIONS: Therapeutic exercises, Therapeutic activity, Neuromuscular re-education, Balance training, Gait training, Patient/Family education, Self Care, Joint mobilization, Stair training, Vestibular training, Canalith repositioning, Aquatic Therapy, Dry Needling, Cryotherapy, Moist heat, Taping, Manual therapy, and Re-evaluation  PLAN FOR NEXT SESSION: reassess HEP, progress hip strength and high level balance   Janene Harvey, PT, DPT 08/12/22 1:44 PM  [facility] at [facility] [address], [place], [place] [address] Phone # [phone] Fax # [phone]

## 2022-08-12 ENCOUNTER — Encounter (HOSPITAL_COMMUNITY): Payer: Self-pay | Admitting: Psychiatry

## 2022-08-12 ENCOUNTER — Ambulatory Visit: Payer: PPO | Attending: Otolaryngology | Admitting: Physical Therapy

## 2022-08-12 ENCOUNTER — Ambulatory Visit (HOSPITAL_COMMUNITY): Payer: PPO | Admitting: Psychiatry

## 2022-08-12 ENCOUNTER — Other Ambulatory Visit: Payer: Self-pay

## 2022-08-12 ENCOUNTER — Encounter: Payer: Self-pay | Admitting: Physical Therapy

## 2022-08-12 DIAGNOSIS — M6281 Muscle weakness (generalized): Secondary | ICD-10-CM

## 2022-08-12 DIAGNOSIS — F331 Major depressive disorder, recurrent, moderate: Secondary | ICD-10-CM | POA: Diagnosis not present

## 2022-08-12 DIAGNOSIS — F411 Generalized anxiety disorder: Secondary | ICD-10-CM | POA: Diagnosis not present

## 2022-08-12 DIAGNOSIS — R2681 Unsteadiness on feet: Secondary | ICD-10-CM | POA: Diagnosis not present

## 2022-08-12 MED ORDER — LAMOTRIGINE 25 MG PO TABS: 25.0000 mg | ORAL_TABLET | Freq: Two times a day (BID) | ORAL | 0 refills | Status: DC

## 2022-08-12 MED ORDER — CLONAZEPAM 0.5 MG PO TABS: ORAL_TABLET | ORAL | 1 refills | Status: DC

## 2022-08-12 MED ORDER — AMITRIPTYLINE HCL 25 MG PO TABS: 25.0000 mg | ORAL_TABLET | Freq: Every day | ORAL | 0 refills | Status: DC

## 2022-08-12 MED ORDER — DULOXETINE HCL 20 MG PO CPEP: 40.0000 mg | ORAL_CAPSULE | Freq: Every day | ORAL | 0 refills | Status: DC

## 2022-08-12 NOTE — Progress Notes (Signed)
[place] MD/PA/NP OP Progress Note   Patient Location: Office Provider Location: Office  08/12/2022 2:16 PM Sarah Rodriguez  MRN:  [id number]   HPI: Patient came for her follow-up appointment.  She is doing well on her current medication.  Recently her ENT recommended a test because she is having some balance issues and she was told not to take the medicine before the test.  Patient had a horrible time but she is glad the test is done and now she is going to start physical therapy.  Overall she do not recall a major issue in her balance but there are times when she closed her highs she feels dizzy.  She endorsed sleep on and off but able to get 7 hours.  She usually had a hard time going into sleep but once she go to sleep she usually gets 7 hours.  She has no tremors, shakes or any EPS.  She denies any crying spells or any feeling of hopelessness or worthlessness.  She has 1 panic attack when she was going to [place] with her husband.  She does not want to change the medication.  She has an upcoming appointment in November to see her PCP.  She is trying to lose weight and watching her calorie intake.  She is hoping her blood work results will be good on her next follow-up.  Patient denies any feeling of hopelessness or worthlessness.   Visit Diagnosis:    ICD-10-CM   1. MDD (major depressive disorder), recurrent episode, moderate (HCC)  F33.1     2. GAD (generalized anxiety disorder)  F41.1       Past Psychiatric History:  H/O depression and taking antidepressants since 1996.  H/O panic attacks while driving.  Saw Dr. Letta Moynahan and tried Prozac, Paxil, Zoloft, Xanax, Cymbalta and Klonopin.  No h/o inpatient treatment or any suicidal attempt.  Past Medical History:  Past Medical History:  Diagnosis Date   Asthma    Dulera daily   Chronic back pain    HNP   Complication of anesthesia    slow to wake up   Depression    takes Clonazepam nightly   GERD (gastroesophageal reflux disease)     takes Protonix daily    Heart murmur    History of migraine    takes Relpax daily as needed   HSV (herpes simplex virus) infection    Hyperlipidemia    takes Atorvastatin daily   Hypertension    takes Benicar daily   Hypothyroidism    takes Synthroid daily   MVP (mitral valve prolapse)    Pneumonia    hx of--been many yrs ago   Seasonal allergies    takes Allegra daily   Shortness of breath    with exertion    Past Surgical History:  Procedure Laterality Date   ABLATION  2011   Uterine   buttocks surgery     at age 43 (coccyx repair)   CERVICAL FUSION  03/2010   CESAREAN SECTION  1992   CHOLECYSTECTOMY  2008   COLONOSCOPY     DILATION AND CURETTAGE OF UTERUS  2011   ESOPHAGOGASTRODUODENOSCOPY     LUMBAR LAMINECTOMY/DECOMPRESSION MICRODISCECTOMY  10/24/2012   Procedure: LUMBAR LAMINECTOMY/DECOMPRESSION MICRODISCECTOMY 1 LEVEL;  Surgeon: Kristeen Miss, MD;  Location: [facility];  Service: Neurosurgery;  Laterality: Right;  Right Lumbar five-Sacral One Microdiskectomy   right knee arthroscopy     x 3   TONSILLECTOMY     at age 81  Family Psychiatric History: Reviewed.  Family History:  Family History  Problem Relation Age of Onset   Arthritis Mother    Heart attack Mother    Depression Mother    Allergies Mother    Hypertension Mother    Heart attack Father     Social History:  Social History   Socioeconomic History   Marital status: Married    Spouse name: Not on file   Number of children: Not on file   Years of education: Not on file   Highest education level: Not on file  Occupational History   Not on file  Tobacco Use   Smoking status: Former   Smokeless tobacco: Never   Tobacco comments:    quit at age 24  Vaping Use   Vaping Use: Never used  Substance and Sexual Activity   Alcohol use: No    Comment: 2-3 times a yr   Drug use: No   Sexual activity: Yes    Birth control/protection: Post-menopausal  Other Topics Concern   Not on file   Social History Narrative   Not on file   Social Determinants of Health   Financial Resource Strain: Not on file  Food Insecurity: Not on file  Transportation Needs: Not on file  Physical Activity: Not on file  Stress: Not on file  Social Connections: Not on file    Allergies:  Allergies  Allergen Reactions   Demerol [Meperidine] Other (See Comments)    REACTION: unknown--itching   Epinephrine Other (See Comments)    "Sensitivity."   Niacin And Related Rash    Metabolic Disorder Labs: Lab Results  Component Value Date   HGBA1C 5.6 09/30/2021   MPG 114 09/30/2021   MPG 117 04/16/2021   No results found for: "PROLACTIN" Lab Results  Component Value Date   CHOL 173 01/06/2022   TRIG 180 (H) 01/06/2022   HDL 47 (L) 01/06/2022   CHOLHDL 3.7 01/06/2022   VLDL 31 (H) 06/06/2017   LDLCALC 98 01/06/2022   LDLCALC 78 09/30/2021   Lab Results  Component Value Date   TSH 1.96 01/06/2022   TSH 1.27 09/30/2021    Therapeutic Level Labs: No results found for: "LITHIUM" No results found for: "VALPROATE" No results found for: "CBMZ"  Current Medications: Current Outpatient Medications  Medication Sig Dispense Refill   albuterol (PROAIR HFA) 108 (90 Base) MCG/ACT inhaler Inhale 2 puffs into the lungs every 6 (six) hours as needed for wheezing or shortness of breath. 8 g 1   amitriptyline (ELAVIL) 25 MG tablet Take 1 tablet (25 mg total) by mouth at bedtime. 90 tablet 0   cetirizine (ZYRTEC) 10 MG tablet TAKE 1 TABLET(10 MG) BY MOUTH DAILY 90 tablet 1   Cholecalciferol (VITAMIN D PO) Take 4,000 Units by mouth daily.      CINNAMON PO Take 1,000 Units by mouth 2 (two) times daily.      clonazePAM (KLONOPIN) 0.5 MG tablet Take 1/2 to 1 tablet as needed for severe anxiety. 30 tablet 1   diclofenac sodium (VOLTAREN) 1 % GEL Apply 4 g topically 4 (four) times daily. 100 g 3   DULoxetine (CYMBALTA) 20 MG capsule Take 2 capsules (40 mg total) by mouth daily. 180 capsule 0    fluticasone (FLONASE) 50 MCG/ACT nasal spray SHAKE LIQUID AND USE 2 SPRAYS IN EACH NOSTRIL EVERY EVENING 48 g 3   fluticasone-salmeterol (ADVAIR HFA) 115-21 MCG/ACT inhaler Use  2 inhalations  15 minutes apart  2 x /day (every  12 hours) 36 g 3   lamoTRIgine (LAMICTAL) 25 MG tablet Take 1 tablet (25 mg total) by mouth 2 (two) times daily. 180 tablet 0   levothyroxine (SYNTHROID) 50 MCG tablet Take 1/2 tablet  Daily  on an empty stomach with only water for 30 minutes & no Antacid meds, Calcium or Magnesium for 4 hours & avoid Biotin 90 tablet 1   losartan (COZAAR) 50 MG tablet Take  1 tablet  Daily  for BP                                                                               /                  TAKE BY MOUTH 90 tablet 3   Magnesium 250 MG TABS Take 3 tablets by mouth daily.      meloxicam (MOBIC) 15 MG tablet Take  1 tablet Daily with Food for Pain & Inflammation                                            /                      TAKE                   BY                      MOUTH 90 tablet 3   montelukast (SINGULAIR) 10 MG tablet TAKE 1 TABLET BY MOUTH DAILY FOR ALLERGIES 90 tablet 3   nystatin (MYCOSTATIN) 100000 UNIT/ML suspension SWISH AND SWALLOW 5 ML BY MOUTH FOUR TIMES DAILY FOR THRUSH OR YEAST INFECTION 480 mL 1   ondansetron (ZOFRAN) 4 MG tablet Take 1 tablet (4 mg total) by mouth every 8 (eight) hours as needed. (Patient not taking: Reported on 04/08/2022) 20 tablet 0   pantoprazole (PROTONIX) 40 MG tablet Take  1 tablet  Daily  to Prevent Heartburn & Indigestion         /TAKE 1 TABLET BY MOUTH EVERY DAY 90 tablet 3   PREMARIN vaginal cream INSERT 1/2 GRAM VAGINALLY VIA APPLICATOR NIGHTLY FOR W EEKS THEN JUST DO TEICE A WEEK  11   pseudoephedrine (SUDAFED) 120 MG 12 hr tablet Take 1 tablet (120 mg total) by mouth 2 (two) times daily as needed for congestion. (Patient not taking: Reported on 04/08/2022) 20 tablet 2   rosuvastatin (CRESTOR) 40 MG tablet Take  1 tablet  2 x /week  for  Cholesterol                                                      /                                 TAKE  BY                MOUTH 26 tablet 3   Spacer/Aero-Holding Chambers (AEROCHAMBER MV) inhaler Use as instructed with inhaler. 1 each 0   No current facility-administered medications for this visit.   Screenings: GAD-7    Flowsheet Row Counselor from 05/18/2022 in [place] Counselor from 05/07/2021 in [place]  Total GAD-7 Score 5 8      PHQ2-9    Flowsheet Row Counselor from 05/18/2022 in [place] Office Visit from 01/06/2022 in [facility] Office Visit from 09/30/2021 in [facility] Counselor from 05/07/2021 in [place] Video Visit from 02/03/2021 in [facility]  PHQ-2 Total Score 2 1 0 4 0  PHQ-9 Total Score 6 -- -- 8 --      Flowsheet Row Counselor from 05/18/2022 in [place] Counselor from 05/07/2021 in [place] Video Visit from 05/05/2021 in [facility]  C-SSRS RISK CATEGORY No Risk No Risk No Risk       Psychiatric Specialty Exam: Physical Exam  Review of Systems  Blood pressure (!) 182/97, pulse 70, resp. rate 18, height '5\' 1"'$  (1.549 m), weight 108 lb 9.6 oz (49.3 kg), SpO2 97 %.There is no height or weight on file to calculate BMI.  General Appearance: Casual  Eye Contact:  Good  Speech:  Clear and Coherent and Normal Rate  Volume:  Normal  Mood:  Anxious  Affect:  Congruent  Thought Process:  Goal Directed  Orientation:  Full (Time, Place, and Person)  Thought Content:  WDL  Suicidal Thoughts:  No  Homicidal Thoughts:  No  Memory:  Immediate;   Good Recent;   Fair Remote;   Fair   Judgement:  Intact  Insight:  Present  Psychomotor Activity:  Normal  Concentration:  Concentration: Fair and Attention Span: Fair  Recall:  Good  Fund of Knowledge:  Good  Language:  Good  Akathisia:  No  Handed:  Right  AIMS (if indicated):     Assets:  Communication Skills Desire for Improvement Housing Transportation  ADL's:  Intact  Cognition:  WNL  Sleep:   7 hrs      Assessment and Plan:  Major depressive disorder, recurrent.  Anxiety.  Patient is stable on her current medication.  Discussed occasional insomnia and I recommend try taking 2 tablets of amitriptyline if that her poor sleep.  She is still taking one fourth of Klonopin which helps her anxiety.  She is supposed to go to [place] but plan has been postponed.  She like to keep the current medication which is Lamictal 25 mg twice a day, Cymbalta 20 mg 2 times a day, Klonopin one fourth as needed and amitriptyline 25 mg but she will try 50 if she cannot sleep.  Recommended to call us back if she has any question or any concern.  Follow-up in 3 months.  Collaboration of Care: Collaboration of Care: Primary Care Provider AEB notes are available in epic to review.  Patient/Guardian was advised Release of Information must be obtained prior to any record release in order to collaborate their care with an outside provider. Patient/Guardian was advised if they have not already done so to contact the registration department to sign all necessary forms in order for [place] to release information regarding their care.  Consent: Patient/Guardian gives verbal consent for treatment and assignment of benefits for services provided during this visit. Patient/Guardian expressed understanding and agreed to proceed.    Kathlee Nations, MD 08/12/2022, 2:16 PM

## 2022-08-13 NOTE — Therapy (Signed)
OUTPATIENT PHYSICAL THERAPY VESTIBULAR TREATMENT     Patient Name: Sarah Rodriguez MRN: [id number] DOB:1956-02-15, 66 y.o., female Today's Date: 08/16/2022  PCP: Unk Pinto, MD REFERRING PROVIDER: Raylene Miyamoto, MD   PT End of Session - 08/16/22 1141     Visit Number 2    Number of Visits 13    Date for PT Re-Evaluation 09/23/22    Authorization Type [employer]    PT Start Time 1105    PT Stop Time 1140    PT Time Calculation (min) 35 min    Equipment Utilized During Treatment Gait belt    Activity Tolerance Patient tolerated treatment well;Patient limited by pain    Behavior During Therapy WFL for tasks assessed/performed              Past Medical History:  Diagnosis Date   Asthma    Dulera daily   Chronic back pain    HNP   Complication of anesthesia    slow to wake up   Depression    takes Clonazepam nightly   GERD (gastroesophageal reflux disease)    takes Protonix daily    Heart murmur    History of migraine    takes Relpax daily as needed   HSV (herpes simplex virus) infection    Hyperlipidemia    takes Atorvastatin daily   Hypertension    takes Benicar daily   Hypothyroidism    takes Synthroid daily   MVP (mitral valve prolapse)    Pneumonia    hx of--been many yrs ago   Seasonal allergies    takes Allegra daily   Shortness of breath    with exertion   Past Surgical History:  Procedure Laterality Date   ABLATION  2011   Uterine   buttocks surgery     at age 52 (coccyx repair)   CERVICAL FUSION  03/2010   CESAREAN SECTION  1992   CHOLECYSTECTOMY  2008   COLONOSCOPY     DILATION AND CURETTAGE OF UTERUS  2011   ESOPHAGOGASTRODUODENOSCOPY     LUMBAR LAMINECTOMY/DECOMPRESSION MICRODISCECTOMY  10/24/2012   Procedure: LUMBAR LAMINECTOMY/DECOMPRESSION MICRODISCECTOMY 1 LEVEL;  Surgeon: Kristeen Miss, MD;  Location: [facility];  Service: Neurosurgery;  Laterality: Right;  Right Lumbar five-Sacral One Microdiskectomy   right knee  arthroscopy     x 3   TONSILLECTOMY     at age 64   Patient Active Problem List   Diagnosis Date Noted   Sleep apnea 06/12/2020   Deviated septum 03/14/2018   Chronic back pain 12/21/2017   Depression, major, recurrent, in partial remission ([place]) 08/05/2016   Degenerative joint disease 08/05/2016   Migraine 03/03/2016   Spondylolisthesis at L4-L5 level 10/27/2015   Other abnormal glucose (hx of prediabetes) 12/12/2014   Vitamin D deficiency 04/17/2014   Medication management 04/17/2014   Hyperlipidemia, mixed    Hypertension    Asthma    GERD (gastroesophageal reflux disease)    Hypothyroidism     ONSET DATE: couple months ago  REFERRING DIAG: R42 (ICD-10-CM) - Dizziness and giddiness  THERAPY DIAG:  Unsteadiness on feet  Muscle weakness (generalized)  Rationale for Evaluation and Treatment Rehabilitation  SUBJECTIVE:   SUBJECTIVE STATEMENT: Knees are already sore because she did a lot over the weekend.   Pt accompanied by: self  PERTINENT HISTORY: asthma, depression, GERD, migraine, HLD, HTN, cervical fusion 2011, L5-S1 lami 2013, R knee scope x3   PAIN:  Are you having pain? Yes: NPRS scale: 6-7/10  Pain location: knees and LB Pain description: "it just hurts" Aggravating factors: "anything that uses my knees" Relieving factors: cream, tylenol  PRECAUTIONS: Fall   PATIENT GOALS improve balance and LE strength  OBJECTIVE:     TODAY'S TREATMENT: 08/16/22 Activity Comments  Nustep Les only L3 x 3 min Les only Slow speed to avoid pain   sidelying hip ABD 10x, with 2# 10x Good alignment   sidelying hip ADD 2x10 2# each Opposite LE resting on bolster for comfort   squat to chair touch 15x Elevated chair height with airex for improved knee comfort   standing HS curl with red TB, 3# 10x Limited by HS pain with TB; improved ability with # but noted some LBP  romberg foam 30" Mild sway  romberg foam EC 30" Moderate sway  Sidestepping yellow TB 4x 62f Mild  imbalance and decreased control towards R          HOME EXERCISE PROGRAM Last updated: 08/16/22 Access Code: [email or web address]: [email or web address] Date: 08/16/2022 [facility]: [facility]  Exercises - Sidelying Hip Abduction  - 1 x daily - 5 x weekly - 2 sets - 10 reps - Sidelying Hip Adduction  - 1 x daily - 5 x weekly - 2 sets - 10 reps - Squat with Chair Touch  - 1 x daily - 5 x weekly - 2 sets - 10 reps - Romberg Stance on Foam Pad  - 1 x daily - 5 x weekly - 2-3 sets - 30 sec hold - Romberg Stance Eyes Closed on Foam Pad  - 1 x daily - 5 x weekly - 2-3 sets - 30 sec hold - Standing Alternating Knee Flexion with Ankle Weights  - 1 x daily - 5 x weekly - 2 sets - 10 reps    PATIENT EDUCATION: Education details: HEP update Person educated: Patient Education method: Explanation, Demonstration, Tactile cues, Verbal cues, and Handouts Education comprehension: verbalized understanding and returned demonstration    Below measures were taken at time of initial evaluation unless otherwise specified:   DIAGNOSTIC FINDINGS: none  COGNITION: Overall cognitive status: Within functional limits for tasks assessed   SENSATION: WFL  POSTURE: forward head; posterior weight shift in standing   LOWER EXTREMITY ROM:     Active  Right eval Left eval  Hip flexion    Hip extension    Hip abduction    Hip adduction    Hip internal rotation    Hip external rotation    Knee flexion    Knee extension    Ankle dorsiflexion 13 10  Ankle plantarflexion    Ankle inversion    Ankle eversion     (Blank rows = not tested)   LOWER EXTREMITY MMT:    MMT Right eval Left eval  Hip flexion 4+ 4+  Hip extension    Hip abduction 4- 4-  Hip adduction 4 4  Hip internal rotation    Hip external rotation    Knee flexion 3+ 4+  Knee extension 4+ 4+  Ankle dorsiflexion 4+ 4+  Ankle plantarflexion 4+ (19 reps) 4+ (15 reps)  Ankle  inversion    Ankle eversion     (Blank rows = not tested)    GAIT: Gait pattern: step through pattern Assistive device utilized: None Level of assistance: Complete Independence   FUNCTIONAL TESTs:       M-CTSIB  Condition 1: Firm Surface, EO 30 Sec, Normal Sway  Condition 2:  Firm Surface, EC 30 Sec, Normal and Mild Sway  Condition 3: Foam Surface, EO 30 Sec, Moderate Sway  Condition 4: Foam Surface, EC 30 Sec, Moderate and Severe Sway     PATIENT SURVEYS:  FOTO NT d/t patient declining dizziness   HOME EXERCISE PROGRAM Access Code: [email or web address] URL: [email or web address] Date: 08/12/2022 Prepared by: [facility]  Exercises - Sidelying Hip Abduction  - 1 x daily - 5 x weekly - 2 sets - 10 reps - Sidelying Hip Adduction  - 1 x daily - 5 x weekly - 2 sets - 10 reps - Standing Hamstring Curl with Resistance  - 1 x daily - 5 x weekly - 2 sets - 10 reps - Squat with Chair Touch  - 1 x daily - 5 x weekly - 2 sets - 10 reps - Romberg Stance on Foam Pad  - 1 x daily - 5 x weekly - 2-3 sets - 30 sec hold - Romberg Stance Eyes Closed on Foam Pad  - 1 x daily - 5 x weekly - 2-3 sets - 30 sec hold    PATIENT EDUCATION: Education details: prognosis, POC, HEP Person educated: Patient Education method: Explanation, Demonstration, Tactile cues, Verbal cues, and Handouts Education comprehension: verbalized understanding and returned demonstration   GOALS: Goals reviewed with patient? Yes  SHORT TERM GOALS: Target date: 09/02/2022  Patient to be independent with initial HEP. Baseline: HEP initiated Goal status: IN PROGRESS  LONG TERM GOALS: Target date: 09/23/2022  Patient to be independent with advanced HEP. Baseline: Not yet initiated  Goal status: IN PROGRESS  Patient to score at least 4/5 with LE MMT.  Baseline: see above Goal status: IN PROGRESS  Patient to demonstrate mild sway with M-CTSIB condition with eyes  closed/foam surface in order to improve safety in environments with uneven surfaces and dim lighting.  Baseline: mod-severe Goal status: IN PROGRESS  Patient to score at least 25/30 on FGA in order to decrease risk of falls.  Baseline: 23 Goal status: IN PROGRESS    ASSESSMENT:  CLINICAL IMPRESSION: Patient arrived to session with report of knee pain from activities over the weekend. Patient apprehensive to use Nustep for warm up d/t existing knee pain, thus trialed short duration for LE ROM to tolerance. Proceeded with review of HEP with intermittent cueing for form and modifications to increase challenge and improve tolerance. Limited stability and control evident with resisted hip strengthening. Patient reported muscle soreness and mild increase in knee pain at end of session.   OBJECTIVE IMPAIRMENTS decreased balance, decreased strength, postural dysfunction, and pain.   ACTIVITY LIMITATIONS carrying, lifting, bending, squatting, and stairs  PARTICIPATION LIMITATIONS: meal prep, cleaning, laundry, shopping, community activity, and yard work  PERSONAL FACTORS Age, Past/current experiences, Time since onset of injury/illness/exacerbation, and 3+ comorbidities: asthma, depression, GERD, migraine, HLD, HTN, cervical fusion 2011, L5-S1 lami 2013, R knee scope x3  are also affecting patient's functional outcome.   REHAB POTENTIAL: Good  CLINICAL DECISION MAKING: Evolving/moderate complexity  EVALUATION COMPLEXITY: Moderate   PLAN: PT FREQUENCY: 1-2x/week  PT DURATION: 6 weeks  PLANNED INTERVENTIONS: Therapeutic exercises, Therapeutic activity, Neuromuscular re-education, Balance training, Gait training, Patient/Family education, Self Care, Joint mobilization, Stair training, Vestibular training, Canalith repositioning, Aquatic Therapy, Dry Needling, Cryotherapy, Moist heat, Taping, Manual therapy, and Re-evaluation  PLAN FOR NEXT SESSION: progress hip strength and high level  balance   Janene Harvey, PT, DPT 08/16/22 11:43 AM  [facility]  Neuro [address], [place], [place] [address] Phone # [phone] Fax # [phone]

## 2022-08-16 ENCOUNTER — Ambulatory Visit: Payer: PPO | Admitting: Physical Therapy

## 2022-08-16 ENCOUNTER — Encounter: Payer: Self-pay | Admitting: Physical Therapy

## 2022-08-16 DIAGNOSIS — R2681 Unsteadiness on feet: Secondary | ICD-10-CM

## 2022-08-16 DIAGNOSIS — M6281 Muscle weakness (generalized): Secondary | ICD-10-CM

## 2022-08-18 ENCOUNTER — Other Ambulatory Visit: Payer: Self-pay | Admitting: Internal Medicine

## 2022-08-18 DIAGNOSIS — K219 Gastro-esophageal reflux disease without esophagitis: Secondary | ICD-10-CM

## 2022-08-19 ENCOUNTER — Ambulatory Visit: Payer: PPO

## 2022-08-19 DIAGNOSIS — R2681 Unsteadiness on feet: Secondary | ICD-10-CM | POA: Diagnosis not present

## 2022-08-19 DIAGNOSIS — M6281 Muscle weakness (generalized): Secondary | ICD-10-CM

## 2022-08-19 NOTE — Therapy (Signed)
OUTPATIENT PHYSICAL THERAPY VESTIBULAR TREATMENT     Patient Name: Sarah Rodriguez MRN: [id number] DOB:1956-10-18, 66 y.o., female Today's Date: 08/19/2022  PCP: Unk Pinto, MD REFERRING PROVIDER: Raylene Miyamoto, MD   PT End of Session - 08/19/22 1103     Visit Number 3    Number of Visits 13    Date for PT Re-Evaluation 09/23/22    Authorization Type [employer]    PT Start Time 1102    PT Stop Time 1145    PT Time Calculation (min) 43 min    Equipment Utilized During Treatment Gait belt    Activity Tolerance Patient tolerated treatment well;Patient limited by pain    Behavior During Therapy WFL for tasks assessed/performed              Past Medical History:  Diagnosis Date   Asthma    Dulera daily   Chronic back pain    HNP   Complication of anesthesia    slow to wake up   Depression    takes Clonazepam nightly   GERD (gastroesophageal reflux disease)    takes Protonix daily    Heart murmur    History of migraine    takes Relpax daily as needed   HSV (herpes simplex virus) infection    Hyperlipidemia    takes Atorvastatin daily   Hypertension    takes Benicar daily   Hypothyroidism    takes Synthroid daily   MVP (mitral valve prolapse)    Pneumonia    hx of--been many yrs ago   Seasonal allergies    takes Allegra daily   Shortness of breath    with exertion   Past Surgical History:  Procedure Laterality Date   ABLATION  2011   Uterine   buttocks surgery     at age 26 (coccyx repair)   CERVICAL FUSION  03/2010   CESAREAN SECTION  1992   CHOLECYSTECTOMY  2008   COLONOSCOPY     DILATION AND CURETTAGE OF UTERUS  2011   ESOPHAGOGASTRODUODENOSCOPY     LUMBAR LAMINECTOMY/DECOMPRESSION MICRODISCECTOMY  10/24/2012   Procedure: LUMBAR LAMINECTOMY/DECOMPRESSION MICRODISCECTOMY 1 LEVEL;  Surgeon: Kristeen Miss, MD;  Location: [facility];  Service: Neurosurgery;  Laterality: Right;  Right Lumbar five-Sacral One Microdiskectomy   right knee  arthroscopy     x 3   TONSILLECTOMY     at age 29   Patient Active Problem List   Diagnosis Date Noted   Sleep apnea 06/12/2020   Deviated septum 03/14/2018   Chronic back pain 12/21/2017   Depression, major, recurrent, in partial remission ([place]) 08/05/2016   Degenerative joint disease 08/05/2016   Migraine 03/03/2016   Spondylolisthesis at L4-L5 level 10/27/2015   Other abnormal glucose (hx of prediabetes) 12/12/2014   Vitamin D deficiency 04/17/2014   Medication management 04/17/2014   Hyperlipidemia, mixed    Hypertension    Asthma    GERD (gastroesophageal reflux disease)    Hypothyroidism     ONSET DATE: couple months ago  REFERRING DIAG: R42 (ICD-10-CM) - Dizziness and giddiness  THERAPY DIAG:  Unsteadiness on feet  Muscle weakness (generalized)  Rationale for Evaluation and Treatment Rehabilitation  SUBJECTIVE:   SUBJECTIVE STATEMENT:  Main area of discomfort is along right anterior knee (pt indicates joint line along either side of patellar tendon)   Pt accompanied by: self  PERTINENT HISTORY: asthma, depression, GERD, migraine, HLD, HTN, cervical fusion 2011, L5-S1 lami 2013, R knee scope x3   PAIN:  Are you having pain? Yes: NPRS scale: 6-7/10 Pain location: knees and LB Pain description: "it just hurts" Aggravating factors: "anything that uses my knees" Relieving factors: cream, tylenol  PRECAUTIONS: Fall   PATIENT GOALS improve balance and LE strength  OBJECTIVE:   TODAY'S TREATMENT: 08/19/22 Activity Comments  Pt education regarding joint health/integrity and exercise intensity/frequency No ligamentous laxity noted grossly right knee w/ valgus, varus, or translatory  Mini-squats 2x10 Use of table for form  Sidelying hip abd 1x12 2#  sidelying hip ADD 1x12 2# each   Standing hamstring curls 2x10 2#+red loop  Sidestepping 2x2 min 2# and yellow loop--cues for foot position and keeping loop undertension  Standing on foam -EC x 30 sec -head  turns EO--increased sway and unsteadiness     TODAY'S TREATMENT: 08/16/22 Activity Comments  Nustep Les only L3 x 3 min Les only Slow speed to avoid pain   sidelying hip ABD 10x, with 2# 10x Good alignment   sidelying hip ADD 2x10 2# each Opposite LE resting on bolster for comfort   squat to chair touch 15x Elevated chair height with airex for improved knee comfort   standing HS curl with red TB, 3# 10x Limited by HS pain with TB; improved ability with # but noted some LBP  romberg foam 30" Mild sway  romberg foam EC 30" Moderate sway  Sidestepping yellow TB 4x 5f Mild imbalance and decreased control towards R          HOME EXERCISE PROGRAM Last updated: 08/16/22 Access Code: [email or web address]: [email or web address] Date: 08/16/2022 [facility] by: [facility]  Exercises - Sidelying Hip Abduction  - 1 x daily - 5 x weekly - 2 sets - 10 reps - Sidelying Hip Adduction  - 1 x daily - 5 x weekly - 2 sets - 10 reps - Squat with Chair Touch  - 1 x daily - 5 x weekly - 2 sets - 10 reps - Romberg Stance on Foam Pad  - 1 x daily - 5 x weekly - 2-3 sets - 30 sec hold - Romberg Stance Eyes Closed on Foam Pad  - 1 x daily - 5 x weekly - 2-3 sets - 30 sec hold - Standing Alternating Knee Flexion with Ankle Weights  - 1 x daily - 5 x weekly - 2 sets - 10 reps    PATIENT EDUCATION: Education details: HEP update Person educated: Patient Education method: Explanation, Demonstration, Tactile cues, Verbal cues, and Handouts Education comprehension: verbalized understanding and returned demonstration    Below measures were taken at time of initial evaluation unless otherwise specified:   DIAGNOSTIC FINDINGS: none  COGNITION: Overall cognitive status: Within functional limits for tasks assessed   SENSATION: WFL  POSTURE: forward head; posterior weight shift in standing   LOWER EXTREMITY ROM:     Active  Right eval Left eval  Hip  flexion    Hip extension    Hip abduction    Hip adduction    Hip internal rotation    Hip external rotation    Knee flexion    Knee extension    Ankle dorsiflexion 13 10  Ankle plantarflexion    Ankle inversion    Ankle eversion     (Blank rows = not tested)   LOWER EXTREMITY MMT:    MMT Right eval Left eval  Hip flexion 4+ 4+  Hip extension    Hip abduction 4- 4-  Hip adduction 4 4  Hip internal rotation    Hip external rotation    Knee flexion 3+ 4+  Knee extension 4+ 4+  Ankle dorsiflexion 4+ 4+  Ankle plantarflexion 4+ (19 reps) 4+ (15 reps)  Ankle inversion    Ankle eversion     (Blank rows = not tested)    GAIT: Gait pattern: step through pattern Assistive device utilized: None Level of assistance: Complete Independence   FUNCTIONAL TESTs:       M-CTSIB  Condition 1: Firm Surface, EO 30 Sec, Normal Sway  Condition 2: Firm Surface, EC 30 Sec, Normal and Mild Sway  Condition 3: Foam Surface, EO 30 Sec, Moderate Sway  Condition 4: Foam Surface, EC 30 Sec, Moderate and Severe Sway     PATIENT SURVEYS:  FOTO NT d/t patient declining dizziness   HOME EXERCISE PROGRAM Access Code: [email or web address] URL: [email or web address] Date: 08/12/2022 Prepared by: [facility]  Exercises - Sidelying Hip Abduction  - 1 x daily - 5 x weekly - 2 sets - 10 reps - Sidelying Hip Adduction  - 1 x daily - 5 x weekly - 2 sets - 10 reps - Standing Hamstring Curl with Resistance  - 1 x daily - 5 x weekly - 2 sets - 10 reps - Squat with Chair Touch  - 1 x daily - 5 x weekly - 2 sets - 10 reps - Romberg Stance on Foam Pad  - 1 x daily - 5 x weekly - 2-3 sets - 30 sec hold - Romberg Stance Eyes Closed on Foam Pad  - 1 x daily - 5 x weekly - 2-3 sets - 30 sec hold    PATIENT EDUCATION: Education details: prognosis, POC, HEP Person educated: Patient Education method: Explanation, Demonstration, Tactile cues, Verbal cues, and  Handouts Education comprehension: verbalized understanding and returned demonstration   GOALS: Goals reviewed with patient? Yes  SHORT TERM GOALS: Target date: 09/02/2022  Patient to be independent with initial HEP. Baseline: HEP initiated Goal status: IN PROGRESS  LONG TERM GOALS: Target date: 09/23/2022  Patient to be independent with advanced HEP. Baseline: Not yet initiated  Goal status: IN PROGRESS  Patient to score at least 4/5 with LE MMT.  Baseline: see above Goal status: IN PROGRESS  Patient to demonstrate mild sway with M-CTSIB condition with eyes closed/foam surface in order to improve safety in environments with uneven surfaces and dim lighting.  Baseline: mod-severe Goal status: IN PROGRESS  Patient to score at least 25/30 on FGA in order to decrease risk of falls.  Baseline: 23 Goal status: IN PROGRESS    ASSESSMENT:  CLINICAL IMPRESSION: Discussion of strength training and benefits for joint pain/arthritic changes and various techniques and implements (e.g. vibration platform, open chain and closed chain PRE, etc). Proceeded with HEP review and implemented use of ankle weights and use of resistance loop for added resistance to progress strength. Cues for sequence and position to facilitate muscle isolation. Continued sessions to progress for closed chain strengthening and addition of stretching if indicated.   OBJECTIVE IMPAIRMENTS decreased balance, decreased strength, postural dysfunction, and pain.   ACTIVITY LIMITATIONS carrying, lifting, bending, squatting, and stairs  PARTICIPATION LIMITATIONS: meal prep, cleaning, laundry, shopping, community activity, and yard work  PERSONAL FACTORS Age, Past/current experiences, Time since onset of injury/illness/exacerbation, and 3+ comorbidities: asthma, depression, GERD, migraine, HLD, HTN, cervical fusion 2011, L5-S1 lami 2013, R knee scope x3  are also affecting patient's functional outcome.   REHAB POTENTIAL:  Good  CLINICAL DECISION MAKING: Evolving/moderate complexity  EVALUATION COMPLEXITY: Moderate   PLAN: PT FREQUENCY: 1-2x/week  PT DURATION: 6 weeks  PLANNED INTERVENTIONS: Therapeutic exercises, Therapeutic activity, Neuromuscular re-education, Balance training, Gait training, Patient/Family education, Self Care, Joint mobilization, Stair training, Vestibular training, Canalith repositioning, Aquatic Therapy, Dry Needling, Cryotherapy, Moist heat, Taping, Manual therapy, and Re-evaluation  PLAN FOR NEXT SESSION: progress hip strength (deadlift: [place] vs traditional?) and high level balance   11:47 AM, 08/19/22 M. Sherlyn Lees, PT, DPT Physical Therapist- [place] Office Number: [phone]   [place] [facility] [address], [place], [place] [address] Phone # [phone] Fax # [phone]

## 2022-08-23 ENCOUNTER — Ambulatory Visit: Payer: PPO | Admitting: Physical Therapy

## 2022-08-23 ENCOUNTER — Encounter: Payer: Self-pay | Admitting: Physical Therapy

## 2022-08-23 DIAGNOSIS — J3089 Other allergic rhinitis: Secondary | ICD-10-CM | POA: Diagnosis not present

## 2022-08-23 DIAGNOSIS — R2681 Unsteadiness on feet: Secondary | ICD-10-CM

## 2022-08-23 DIAGNOSIS — M6281 Muscle weakness (generalized): Secondary | ICD-10-CM

## 2022-08-23 DIAGNOSIS — J4530 Mild persistent asthma, uncomplicated: Secondary | ICD-10-CM | POA: Diagnosis not present

## 2022-08-23 DIAGNOSIS — H6990 Unspecified Eustachian tube disorder, unspecified ear: Secondary | ICD-10-CM | POA: Diagnosis not present

## 2022-08-23 DIAGNOSIS — L2089 Other atopic dermatitis: Secondary | ICD-10-CM | POA: Diagnosis not present

## 2022-08-23 NOTE — Therapy (Signed)
OUTPATIENT PHYSICAL THERAPY VESTIBULAR TREATMENT     Patient Name: Sarah Rodriguez MRN: [id number] DOB:18-Feb-1956, 66 y.o., female Today's Date: 08/23/2022  PCP: Unk Pinto, MD REFERRING PROVIDER: Raylene Miyamoto, MD   PT End of Session - 08/23/22 1353     Visit Number 4    Number of Visits 13    Date for PT Re-Evaluation 09/23/22    Authorization Type [employer]    PT Start Time 1316    PT Stop Time 1354    PT Time Calculation (min) 38 min    Activity Tolerance Patient tolerated treatment well    Behavior During Therapy WFL for tasks assessed/performed               Past Medical History:  Diagnosis Date   Asthma    Dulera daily   Chronic back pain    HNP   Complication of anesthesia    slow to wake up   Depression    takes Clonazepam nightly   GERD (gastroesophageal reflux disease)    takes Protonix daily    Heart murmur    History of migraine    takes Relpax daily as needed   HSV (herpes simplex virus) infection    Hyperlipidemia    takes Atorvastatin daily   Hypertension    takes Benicar daily   Hypothyroidism    takes Synthroid daily   MVP (mitral valve prolapse)    Pneumonia    hx of--been many yrs ago   Seasonal allergies    takes Allegra daily   Shortness of breath    with exertion   Past Surgical History:  Procedure Laterality Date   ABLATION  2011   Uterine   buttocks surgery     at age 79 (coccyx repair)   CERVICAL FUSION  03/2010   CESAREAN SECTION  1992   CHOLECYSTECTOMY  2008   COLONOSCOPY     DILATION AND CURETTAGE OF UTERUS  2011   ESOPHAGOGASTRODUODENOSCOPY     LUMBAR LAMINECTOMY/DECOMPRESSION MICRODISCECTOMY  10/24/2012   Procedure: LUMBAR LAMINECTOMY/DECOMPRESSION MICRODISCECTOMY 1 LEVEL;  Surgeon: Kristeen Miss, MD;  Location: [facility];  Service: Neurosurgery;  Laterality: Right;  Right Lumbar five-Sacral One Microdiskectomy   right knee arthroscopy     x 3   TONSILLECTOMY     at age 64   Patient Active Problem  List   Diagnosis Date Noted   Sleep apnea 06/12/2020   Deviated septum 03/14/2018   Chronic back pain 12/21/2017   Depression, major, recurrent, in partial remission ([place]) 08/05/2016   Degenerative joint disease 08/05/2016   Migraine 03/03/2016   Spondylolisthesis at L4-L5 level 10/27/2015   Other abnormal glucose (hx of prediabetes) 12/12/2014   Vitamin D deficiency 04/17/2014   Medication management 04/17/2014   Hyperlipidemia, mixed    Hypertension    Asthma    GERD (gastroesophageal reflux disease)    Hypothyroidism     ONSET DATE: couple months ago  REFERRING DIAG: R42 (ICD-10-CM) - Dizziness and giddiness  THERAPY DIAG:  Unsteadiness on feet  Muscle weakness (generalized)  Rationale for Evaluation and Treatment Rehabilitation  SUBJECTIVE:   SUBJECTIVE STATEMENT: Was weeding on an uneven surface and felt it in her legs over the weekend. HEP is going well. Feeling less fearful of movement.  Pt accompanied by: self  PERTINENT HISTORY: asthma, depression, GERD, migraine, HLD, HTN, cervical fusion 2011, L5-S1 lami 2013, R knee scope x3   PAIN:  Are you having pain? Yes: NPRS scale: 3/10  Pain location: knees and LB Pain description: sore, discomfort Aggravating factors: "anything that uses my knees" Relieving factors: cream, tylenol  PRECAUTIONS: Fall   PATIENT GOALS improve balance and LE strength  OBJECTIVE:     TODAY'S TREATMENT: 08/23/22 Activity Comments  standing 4 way hip with red TB and single walking pole 10x Cues for posture and controlled kicks;imbalance  Sitting KTOS and fig 4 stretch B, 30" each   wall squats with red pball 2x10 Cues for proper positioning for max comfort; good tolerance   romberg foam 30" Cues for posture and muscle activation  romberg EC foam 30" Moderate sway   Standing wide on foam EC + head turns/nods 2x30" each Considerable ankle strategy/sway with head turns   single leg RDL to elevated mat More difficulty standing  on L; cues to hinge at hips     [place] Last updated: 08/23/22 Access Code: [email or web address] URL: [email or web address] Date: 08/23/2022 Prepared by: [facility]  Exercises - Sidelying Hip Abduction  - 1 x daily - 5 x weekly - 2 sets - 10 reps - Sidelying Hip Adduction  - 1 x daily - 5 x weekly - 2 sets - 10 reps - Romberg Stance Eyes Closed on Foam Pad  - 1 x daily - 5 x weekly - 2-3 sets - 30 sec hold - Standing Alternating Knee Flexion with Ankle Weights  - 1 x daily - 5 x weekly - 2 sets - 10 reps - Wall Quarter Squat with Swiss Ball  - 1 x daily - 5 x weekly - 2 sets - 10 reps - Romberg Stance on Foam Pad with Head Rotation  - 1 x daily - 5 x weekly - 2 sets - 10 reps - Romberg Stance with Head Nods on Foam Pad  - 1 x daily - 5 x weekly - 2 sets - 10 reps   PATIENT EDUCATION: Education details: HEP update Person educated: Patient Education method: Explanation, Demonstration, Tactile cues, Verbal cues, and Handouts Education comprehension: verbalized understanding and returned demonstration       Below measures were taken at time of initial evaluation unless otherwise specified:   DIAGNOSTIC FINDINGS: none  COGNITION: Overall cognitive status: Within functional limits for tasks assessed   SENSATION: WFL  POSTURE: forward head; posterior weight shift in standing   LOWER EXTREMITY ROM:     Active  Right eval Left eval  Hip flexion    Hip extension    Hip abduction    Hip adduction    Hip internal rotation    Hip external rotation    Knee flexion    Knee extension    Ankle dorsiflexion 13 10  Ankle plantarflexion    Ankle inversion    Ankle eversion     (Blank rows = not tested)   LOWER EXTREMITY MMT:    MMT Right eval Left eval  Hip flexion 4+ 4+  Hip extension    Hip abduction 4- 4-  Hip adduction 4 4  Hip internal rotation    Hip external rotation    Knee flexion 3+ 4+  Knee  extension 4+ 4+  Ankle dorsiflexion 4+ 4+  Ankle plantarflexion 4+ (19 reps) 4+ (15 reps)  Ankle inversion    Ankle eversion     (Blank rows = not tested)    GAIT: Gait pattern: step through pattern Assistive device utilized: None Level of assistance: Complete Independence   FUNCTIONAL TESTs:  M-CTSIB  Condition 1: Firm Surface, EO 30 Sec, Normal Sway  Condition 2: Firm Surface, EC 30 Sec, Normal and Mild Sway  Condition 3: Foam Surface, EO 30 Sec, Moderate Sway  Condition 4: Foam Surface, EC 30 Sec, Moderate and Severe Sway     PATIENT SURVEYS:  FOTO NT d/t patient declining dizziness   HOME EXERCISE PROGRAM Access Code: [email or web address] URL: [email or web address] Date: 08/12/2022 Prepared by: [facility]  Exercises - Sidelying Hip Abduction  - 1 x daily - 5 x weekly - 2 sets - 10 reps - Sidelying Hip Adduction  - 1 x daily - 5 x weekly - 2 sets - 10 reps - Standing Hamstring Curl with Resistance  - 1 x daily - 5 x weekly - 2 sets - 10 reps - Squat with Chair Touch  - 1 x daily - 5 x weekly - 2 sets - 10 reps - Romberg Stance on Foam Pad  - 1 x daily - 5 x weekly - 2-3 sets - 30 sec hold - Romberg Stance Eyes Closed on Foam Pad  - 1 x daily - 5 x weekly - 2-3 sets - 30 sec hold    PATIENT EDUCATION: Education details: prognosis, POC, HEP Person educated: Patient Education method: Explanation, Demonstration, Tactile cues, Verbal cues, and Handouts Education comprehension: verbalized understanding and returned demonstration   GOALS: Goals reviewed with patient? Yes  SHORT TERM GOALS: Target date: 09/02/2022  Patient to be independent with initial HEP. Baseline: HEP initiated Goal status: IN PROGRESS  LONG TERM GOALS: Target date: 09/23/2022  Patient to be independent with advanced HEP. Baseline: Not yet initiated  Goal status: IN PROGRESS  Patient to score at least 4/5 with LE MMT.  Baseline: see  above Goal status: IN PROGRESS  Patient to demonstrate mild sway with M-CTSIB condition with eyes closed/foam surface in order to improve safety in environments with uneven surfaces and dim lighting.  Baseline: mod-severe Goal status: IN PROGRESS  Patient to score at least 25/30 on FGA in order to decrease risk of falls.  Baseline: 23 Goal status: IN PROGRESS    ASSESSMENT:  CLINICAL IMPRESSION: Patient arrived to session without new complaints. Worked on progressive hip strengthening with resistance- patient tolerated this well but also demonstrated instability and limited control d/t imbalance and weakness. Balance activities on foam appeared slightly more improved with verbal cueing. Able to progress these activities with addition of head turns and nods- more difficulty with head nods. Patent overall demonstrated most imbalance when attempting to balance on L vs. R LE today. Patient reported understanding of all edu provided and without complaints at end of session.     OBJECTIVE IMPAIRMENTS decreased balance, decreased strength, postural dysfunction, and pain.   ACTIVITY LIMITATIONS carrying, lifting, bending, squatting, and stairs  PARTICIPATION LIMITATIONS: meal prep, cleaning, laundry, shopping, community activity, and yard work  PERSONAL FACTORS Age, Past/current experiences, Time since onset of injury/illness/exacerbation, and 3+ comorbidities: asthma, depression, GERD, migraine, HLD, HTN, cervical fusion 2011, L5-S1 lami 2013, R knee scope x3  are also affecting patient's functional outcome.   REHAB POTENTIAL: Good  CLINICAL DECISION MAKING: Evolving/moderate complexity  EVALUATION COMPLEXITY: Moderate   PLAN: PT FREQUENCY: 1-2x/week  PT DURATION: 6 weeks  PLANNED INTERVENTIONS: Therapeutic exercises, Therapeutic activity, Neuromuscular re-education, Balance training, Gait training, Patient/Family education, Self Care, Joint mobilization, Stair training, Vestibular  training, Canalith repositioning, Aquatic Therapy, Dry Needling, Cryotherapy, Moist heat, Taping, Manual therapy, and Re-evaluation  PLAN  FOR NEXT SESSION: progress hip strength (deadlift: [place] vs traditional?) and high level balance   Janene Harvey, PT, DPT 08/23/22 1:57 PM  [facility] at [facility] [address] [place], [place], [place] [address] Phone # [phone] Fax # [phone]

## 2022-08-24 ENCOUNTER — Other Ambulatory Visit: Payer: Self-pay | Admitting: Nurse Practitioner

## 2022-08-24 DIAGNOSIS — B3789 Other sites of candidiasis: Secondary | ICD-10-CM

## 2022-08-26 ENCOUNTER — Ambulatory Visit: Payer: PPO

## 2022-08-26 ENCOUNTER — Ambulatory Visit: Payer: PPO | Admitting: Rehabilitative and Restorative Service Providers"

## 2022-08-30 ENCOUNTER — Encounter: Payer: Self-pay | Admitting: Physical Therapy

## 2022-08-30 ENCOUNTER — Ambulatory Visit: Payer: PPO | Attending: Otolaryngology | Admitting: Physical Therapy

## 2022-08-30 DIAGNOSIS — R2681 Unsteadiness on feet: Secondary | ICD-10-CM | POA: Diagnosis not present

## 2022-08-30 DIAGNOSIS — M6281 Muscle weakness (generalized): Secondary | ICD-10-CM | POA: Diagnosis not present

## 2022-08-30 NOTE — Therapy (Signed)
OUTPATIENT PHYSICAL THERAPY VESTIBULAR TREATMENT     Patient Name: Sarah Rodriguez MRN: [id number] DOB:02-25-1956, 66 y.o., female Today's Date: 08/30/2022  PCP: Unk Pinto, MD REFERRING PROVIDER: Raylene Miyamoto, MD   PT End of Session - 08/30/22 1232     Visit Number 5    Number of Visits 13    Date for PT Re-Evaluation 09/23/22    Authorization Type [employer]    PT Start Time 1233    PT Stop Time 1314    PT Time Calculation (min) 41 min    Equipment Utilized During Treatment Gait belt    Activity Tolerance Patient tolerated treatment well    Behavior During Therapy WFL for tasks assessed/performed                Past Medical History:  Diagnosis Date   Asthma    Dulera daily   Chronic back pain    HNP   Complication of anesthesia    slow to wake up   Depression    takes Clonazepam nightly   GERD (gastroesophageal reflux disease)    takes Protonix daily    Heart murmur    History of migraine    takes Relpax daily as needed   HSV (herpes simplex virus) infection    Hyperlipidemia    takes Atorvastatin daily   Hypertension    takes Benicar daily   Hypothyroidism    takes Synthroid daily   MVP (mitral valve prolapse)    Pneumonia    hx of--been many yrs ago   Seasonal allergies    takes Allegra daily   Shortness of breath    with exertion   Past Surgical History:  Procedure Laterality Date   ABLATION  2011   Uterine   buttocks surgery     at age 17 (coccyx repair)   CERVICAL FUSION  03/2010   CESAREAN SECTION  1992   CHOLECYSTECTOMY  2008   COLONOSCOPY     DILATION AND CURETTAGE OF UTERUS  2011   ESOPHAGOGASTRODUODENOSCOPY     LUMBAR LAMINECTOMY/DECOMPRESSION MICRODISCECTOMY  10/24/2012   Procedure: LUMBAR LAMINECTOMY/DECOMPRESSION MICRODISCECTOMY 1 LEVEL;  Surgeon: Kristeen Miss, MD;  Location: [facility];  Service: Neurosurgery;  Laterality: Right;  Right Lumbar five-Sacral One Microdiskectomy   right knee arthroscopy     x 3    TONSILLECTOMY     at age 72   Patient Active Problem List   Diagnosis Date Noted   Sleep apnea 06/12/2020   Deviated septum 03/14/2018   Chronic back pain 12/21/2017   Depression, major, recurrent, in partial remission ([place]) 08/05/2016   Degenerative joint disease 08/05/2016   Migraine 03/03/2016   Spondylolisthesis at L4-L5 level 10/27/2015   Other abnormal glucose (hx of prediabetes) 12/12/2014   Vitamin D deficiency 04/17/2014   Medication management 04/17/2014   Hyperlipidemia, mixed    Hypertension    Asthma    GERD (gastroesophageal reflux disease)    Hypothyroidism     ONSET DATE: couple months ago  REFERRING DIAG: R42 (ICD-10-CM) - Dizziness and giddiness  THERAPY DIAG:  Unsteadiness on feet  Muscle weakness (generalized)  Rationale for Evaluation and Treatment Rehabilitation  SUBJECTIVE:   SUBJECTIVE STATEMENT: Used 4# weight at home, and had some pain in inner thigh, but it's better.  No falls.  Did recover from loss of balance when pulling weeds.  Pt accompanied by: self  PERTINENT HISTORY: asthma, depression, GERD, migraine, HLD, HTN, cervical fusion 2011, L5-S1 lami 2013, R knee  scope x3   PAIN:  Are you having pain?No  PRECAUTIONS: Fall   PATIENT GOALS improve balance and LE strength  OBJECTIVE:    TODAY'S TREATMENT: 08/30/2022 Activity Comments  Resisted sidestepping red theraband, 3 reps R and L Cues for control  Resisted monsterwalk, 2 reps forward/back with yellow band Cues for control  Resisted monster walk/SLS no resistance forward/back with intermittent UE support LOB with SLS to L side, needing UE support  Tandem stance 2 x 30 sec each foot position, tandem gait 4 reps forward Cues for abdominal activation, use of visual target to aid in stability  Corner balance feet together with EO and EC head turns/head nods x 10 reps Min guard support  Rockerboard work for hip/ankle strategy work for limits of stability; balance in midline, with  self-correction using hip/ankle strategy Use of UE support for stability, increased sway through ankles with ant/posterior direction  Standing on Airex: forward step taps x 10, side step taps x 10 Light UE support  *Pt asked question at beginning of session about using her 65 cm therapy ball at home to help with wall squats.  Trialed in session today, and pt appears to perform well with 65 cm ball behind back.        HOME EXERCISE PROGRAM Last updated: 08/23/22 Access Code: [email or web address] URL: [email or web address] Date: 08/23/2022 Prepared by: [facility]  Exercises - Sidelying Hip Abduction  - 1 x daily - 5 x weekly - 2 sets - 10 reps - Sidelying Hip Adduction  - 1 x daily - 5 x weekly - 2 sets - 10 reps - Romberg Stance Eyes Closed on Foam Pad  - 1 x daily - 5 x weekly - 2-3 sets - 30 sec hold - Standing Alternating Knee Flexion with Ankle Weights  - 1 x daily - 5 x weekly - 2 sets - 10 reps - Wall Quarter Squat with Swiss Ball  - 1 x daily - 5 x weekly - 2 sets - 10 reps - Romberg Stance on Foam Pad with Head Rotation  - 1 x daily - 5 x weekly - 2 sets - 10 reps - Romberg Stance with Head Nods on Foam Pad  - 1 x daily - 5 x weekly - 2 sets - 10 reps        Below measures were taken at time of initial evaluation unless otherwise specified:   DIAGNOSTIC FINDINGS: none  COGNITION: Overall cognitive status: Within functional limits for tasks assessed   SENSATION: WFL  POSTURE: forward head; posterior weight shift in standing   LOWER EXTREMITY ROM:     Active  Right eval Left eval  Hip flexion    Hip extension    Hip abduction    Hip adduction    Hip internal rotation    Hip external rotation    Knee flexion    Knee extension    Ankle dorsiflexion 13 10  Ankle plantarflexion    Ankle inversion    Ankle eversion     (Blank rows = not tested)   LOWER EXTREMITY MMT:    MMT Right eval Left eval  Hip flexion 4+  4+  Hip extension    Hip abduction 4- 4-  Hip adduction 4 4  Hip internal rotation    Hip external rotation    Knee flexion 3+ 4+  Knee extension 4+ 4+  Ankle dorsiflexion 4+ 4+  Ankle plantarflexion 4+ (19 reps) 4+ (15  reps)  Ankle inversion    Ankle eversion     (Blank rows = not tested)    GAIT: Gait pattern: step through pattern Assistive device utilized: None Level of assistance: Complete Independence   FUNCTIONAL TESTs:       M-CTSIB  Condition 1: Firm Surface, EO 30 Sec, Normal Sway  Condition 2: Firm Surface, EC 30 Sec, Normal and Mild Sway  Condition 3: Foam Surface, EO 30 Sec, Moderate Sway  Condition 4: Foam Surface, EC 30 Sec, Moderate and Severe Sway     PATIENT SURVEYS:  FOTO NT d/t patient declining dizziness   HOME EXERCISE PROGRAM Access Code: [email or web address] URL: [email or web address] Date: 08/12/2022 Prepared by: [facility]  Exercises - Sidelying Hip Abduction  - 1 x daily - 5 x weekly - 2 sets - 10 reps - Sidelying Hip Adduction  - 1 x daily - 5 x weekly - 2 sets - 10 reps - Standing Hamstring Curl with Resistance  - 1 x daily - 5 x weekly - 2 sets - 10 reps - Squat with Chair Touch  - 1 x daily - 5 x weekly - 2 sets - 10 reps - Romberg Stance on Foam Pad  - 1 x daily - 5 x weekly - 2-3 sets - 30 sec hold - Romberg Stance Eyes Closed on Foam Pad  - 1 x daily - 5 x weekly - 2-3 sets - 30 sec hold    PATIENT EDUCATION: Education details: prognosis, POC, HEP Person educated: Patient Education method: Explanation, Demonstration, Tactile cues, Verbal cues, and Handouts Education comprehension: verbalized understanding and returned demonstration   GOALS: Goals reviewed with patient? Yes  SHORT TERM GOALS: Target date: 09/02/2022  Patient to be independent with initial HEP. Baseline: HEP initiated Goal status: IN PROGRESS  LONG TERM GOALS: Target date: 09/23/2022  Patient to be  independent with advanced HEP. Baseline: Not yet initiated  Goal status: IN PROGRESS  Patient to score at least 4/5 with LE MMT.  Baseline: see above Goal status: IN PROGRESS  Patient to demonstrate mild sway with M-CTSIB condition with eyes closed/foam surface in order to improve safety in environments with uneven surfaces and dim lighting.  Baseline: mod-severe Goal status: IN PROGRESS  Patient to score at least 25/30 on FGA in order to decrease risk of falls.  Baseline: 23 Goal status: IN PROGRESS    ASSESSMENT:  CLINICAL IMPRESSION: Pt presents to OPPT session today without new complaints.  She used heavier than normal weight at home this weekend and had some inner thigh pain, which has mostly resolved.  Worked on dynamic balance, hip stability and limits of stability today.  She does have LOB with dynamic SLS, more to L side than R side. She has increased difficulty with anterior/posterior limits of stability with hip/ankle strategy work on rockerboard, with improved ease in lateral directions.  Her preference is to reach for UE support, versus use of hip/ankle strategy.  She will continue to benefit from skilled PT towards goals for improved balance and decreased fall risk.     OBJECTIVE IMPAIRMENTS decreased balance, decreased strength, postural dysfunction, and pain.   ACTIVITY LIMITATIONS carrying, lifting, bending, squatting, and stairs  PARTICIPATION LIMITATIONS: meal prep, cleaning, laundry, shopping, community activity, and yard work  PERSONAL FACTORS Age, Past/current experiences, Time since onset of injury/illness/exacerbation, and 3+ comorbidities: asthma, depression, GERD, migraine, HLD, HTN, cervical fusion 2011, L5-S1 lami 2013, R knee scope x3  are also  affecting patient's functional outcome.   REHAB POTENTIAL: Good  CLINICAL DECISION MAKING: Evolving/moderate complexity  EVALUATION COMPLEXITY: Moderate   PLAN: PT FREQUENCY: 1-2x/week  PT DURATION: 6  weeks  PLANNED INTERVENTIONS: Therapeutic exercises, Therapeutic activity, Neuromuscular re-education, Balance training, Gait training, Patient/Family education, Self Care, Joint mobilization, Stair training, Vestibular training, Canalith repositioning, Aquatic Therapy, Dry Needling, Cryotherapy, Moist heat, Taping, Manual therapy, and Re-evaluation  PLAN FOR NEXT SESSION: Check STG (HEP review) next visit.  Progress hip strength (deadlift: [place] vs traditional?) and high level balance; work on balance strategies, limits of stability towards Ermalene Postin, PT 08/30/22 5:11 PM Phone: [phone] Fax: [facility] at [facility] [address] [place], [place] [address] Phone # [phone] Fax # [phone]

## 2022-09-02 ENCOUNTER — Ambulatory Visit: Payer: PPO

## 2022-09-02 DIAGNOSIS — R2681 Unsteadiness on feet: Secondary | ICD-10-CM

## 2022-09-02 DIAGNOSIS — M6281 Muscle weakness (generalized): Secondary | ICD-10-CM

## 2022-09-02 NOTE — Therapy (Signed)
OUTPATIENT PHYSICAL THERAPY VESTIBULAR TREATMENT     Patient Name: Sarah Rodriguez MRN: [id number] DOB:Apr 11, 1956, 66 y.o., female Today's Date: 09/02/2022  PCP: Unk Pinto, MD REFERRING PROVIDER: Raylene Miyamoto, MD   PT End of Session - 09/02/22 1403     Visit Number 6    Number of Visits 13    Date for PT Re-Evaluation 09/23/22    Authorization Type [employer]    PT Start Time 5102    PT Stop Time 1445    PT Time Calculation (min) 42 min    Equipment Utilized During Treatment Gait belt    Activity Tolerance Patient tolerated treatment well    Behavior During Therapy WFL for tasks assessed/performed                Past Medical History:  Diagnosis Date   Asthma    Dulera daily   Chronic back pain    HNP   Complication of anesthesia    slow to wake up   Depression    takes Clonazepam nightly   GERD (gastroesophageal reflux disease)    takes Protonix daily    Heart murmur    History of migraine    takes Relpax daily as needed   HSV (herpes simplex virus) infection    Hyperlipidemia    takes Atorvastatin daily   Hypertension    takes Benicar daily   Hypothyroidism    takes Synthroid daily   MVP (mitral valve prolapse)    Pneumonia    hx of--been many yrs ago   Seasonal allergies    takes Allegra daily   Shortness of breath    with exertion   Past Surgical History:  Procedure Laterality Date   ABLATION  2011   Uterine   buttocks surgery     at age 55 (coccyx repair)   CERVICAL FUSION  03/2010   CESAREAN SECTION  1992   CHOLECYSTECTOMY  2008   COLONOSCOPY     DILATION AND CURETTAGE OF UTERUS  2011   ESOPHAGOGASTRODUODENOSCOPY     LUMBAR LAMINECTOMY/DECOMPRESSION MICRODISCECTOMY  10/24/2012   Procedure: LUMBAR LAMINECTOMY/DECOMPRESSION MICRODISCECTOMY 1 LEVEL;  Surgeon: Kristeen Miss, MD;  Location: [facility];  Service: Neurosurgery;  Laterality: Right;  Right Lumbar five-Sacral One Microdiskectomy   right knee arthroscopy     x 3    TONSILLECTOMY     at age 71   Patient Active Problem List   Diagnosis Date Noted   Sleep apnea 06/12/2020   Deviated septum 03/14/2018   Chronic back pain 12/21/2017   Depression, major, recurrent, in partial remission ([place]) 08/05/2016   Degenerative joint disease 08/05/2016   Migraine 03/03/2016   Spondylolisthesis at L4-L5 level 10/27/2015   Other abnormal glucose (hx of prediabetes) 12/12/2014   Vitamin D deficiency 04/17/2014   Medication management 04/17/2014   Hyperlipidemia, mixed    Hypertension    Asthma    GERD (gastroesophageal reflux disease)    Hypothyroidism     ONSET DATE: couple months ago  REFERRING DIAG: R42 (ICD-10-CM) - Dizziness and giddiness  THERAPY DIAG:  Unsteadiness on feet  Muscle weakness (generalized)  Rationale for Evaluation and Treatment Rehabilitation  SUBJECTIVE:   SUBJECTIVE STATEMENT: Knees are feeling a bit better,   Pt accompanied by: self  PERTINENT HISTORY: asthma, depression, GERD, migraine, HLD, HTN, cervical fusion 2011, L5-S1 lami 2013, R knee scope x3   PAIN:  Are you having pain?No  PRECAUTIONS: Fall   PATIENT GOALS improve balance and LE  strength  OBJECTIVE:   TODAY'S TREATMENT: 09/02/22 Activity Comments  HEP review -Hip abd -hip add -Standing Alternating Knee Flexion with Ankle Weights -Romberg on foam   Sit-stand w/ goblet 5x5 8#  Sumo deadlift 3x5 8# cues for form and posture  Farmer's carry march 4x20 ft 8# w/ march--cues in slow, deliberate pace           TODAY'S TREATMENT: 08/30/2022 Activity Comments  Resisted sidestepping red theraband, 3 reps R and L Cues for control  Resisted monsterwalk, 2 reps forward/back with yellow band Cues for control  Resisted monster walk/SLS no resistance forward/back with intermittent UE support LOB with SLS to L side, needing UE support  Tandem stance 2 x 30 sec each foot position, tandem gait 4 reps forward Cues for abdominal activation, use of visual target to  aid in stability  Corner balance feet together with EO and EC head turns/head nods x 10 reps Min guard support  Rockerboard work for hip/ankle strategy work for limits of stability; balance in midline, with self-correction using hip/ankle strategy Use of UE support for stability, increased sway through ankles with ant/posterior direction  Standing on Airex: forward step taps x 10, side step taps x 10 Light UE support  *Pt asked question at beginning of session about using her 65 cm therapy ball at home to help with wall squats.  Trialed in session today, and pt appears to perform well with 65 cm ball behind back.        HOME EXERCISE PROGRAM Last updated: 08/23/22 Access Code: [email or web address] URL: [email or web address] Date: 08/23/2022 Prepared by: [facility]  Exercises - Sidelying Hip Abduction  - 1 x daily - 5 x weekly - 2 sets - 10 reps - Sidelying Hip Adduction  - 1 x daily - 5 x weekly - 2 sets - 10 reps - Romberg Stance Eyes Closed on Foam Pad  - 1 x daily - 5 x weekly - 2-3 sets - 30 sec hold - Standing Alternating Knee Flexion with Ankle Weights  - 1 x daily - 5 x weekly - 2 sets - 10 reps - Wall Quarter Squat with Swiss Ball  - 1 x daily - 5 x weekly - 2 sets - 10 reps - Romberg Stance on Foam Pad with Head Rotation  - 1 x daily - 5 x weekly - 2 sets - 10 reps - Romberg Stance with Head Nods on Foam Pad  - 1 x daily - 5 x weekly - 2 sets - 10 reps - Goblet Squat with Kettlebell  - 1 x daily - 7 x weekly - 5 sets - 5 reps - Sumo Squat with Dumbbell  - 1 x daily - 7 x weekly - 5 sets - 5 reps - Farmer's Walk  - 1 x daily - 7 x weekly - 3 sets - 10 reps       Below measures were taken at time of initial evaluation unless otherwise specified:   DIAGNOSTIC FINDINGS: none  COGNITION: Overall cognitive status: Within functional limits for tasks assessed   SENSATION: WFL  POSTURE: forward head; posterior weight shift in  standing   LOWER EXTREMITY ROM:     Active  Right eval Left eval  Hip flexion    Hip extension    Hip abduction    Hip adduction    Hip internal rotation    Hip external rotation    Knee flexion    Knee extension  Ankle dorsiflexion 13 10  Ankle plantarflexion    Ankle inversion    Ankle eversion     (Blank rows = not tested)   LOWER EXTREMITY MMT:    MMT Right eval Left eval  Hip flexion 4+ 4+  Hip extension    Hip abduction 4- 4-  Hip adduction 4 4  Hip internal rotation    Hip external rotation    Knee flexion 3+ 4+  Knee extension 4+ 4+  Ankle dorsiflexion 4+ 4+  Ankle plantarflexion 4+ (19 reps) 4+ (15 reps)  Ankle inversion    Ankle eversion     (Blank rows = not tested)    GAIT: Gait pattern: step through pattern Assistive device utilized: None Level of assistance: Complete Independence   FUNCTIONAL TESTs:       M-CTSIB  Condition 1: Firm Surface, EO 30 Sec, Normal Sway  Condition 2: Firm Surface, EC 30 Sec, Normal and Mild Sway  Condition 3: Foam Surface, EO 30 Sec, Moderate Sway  Condition 4: Foam Surface, EC 30 Sec, Moderate and Severe Sway     PATIENT SURVEYS:  FOTO NT d/t patient declining dizziness   HOME EXERCISE PROGRAM Access Code: [email or web address] URL: [email or web address] Date: 08/12/2022 Prepared by: [facility]  Exercises - Sidelying Hip Abduction  - 1 x daily - 5 x weekly - 2 sets - 10 reps - Sidelying Hip Adduction  - 1 x daily - 5 x weekly - 2 sets - 10 reps - Standing Hamstring Curl with Resistance  - 1 x daily - 5 x weekly - 2 sets - 10 reps - Squat with Chair Touch  - 1 x daily - 5 x weekly - 2 sets - 10 reps - Romberg Stance on Foam Pad  - 1 x daily - 5 x weekly - 2-3 sets - 30 sec hold - Romberg Stance Eyes Closed on Foam Pad  - 1 x daily - 5 x weekly - 2-3 sets - 30 sec hold    PATIENT EDUCATION: Education details: prognosis, POC, HEP Person educated:  Patient Education method: Explanation, Demonstration, Tactile cues, Verbal cues, and Handouts Education comprehension: verbalized understanding and returned demonstration   GOALS: Goals reviewed with patient? Yes  SHORT TERM GOALS: Target date: 09/02/2022  Patient to be independent with initial HEP. Baseline: HEP initiated Goal status: MET  LONG TERM GOALS: Target date: 09/23/2022  Patient to be independent with advanced HEP. Baseline: Not yet initiated  Goal status: IN PROGRESS  Patient to score at least 4/5 with LE MMT.  Baseline: see above Goal status: IN PROGRESS  Patient to demonstrate mild sway with M-CTSIB condition with eyes closed/foam surface in order to improve safety in environments with uneven surfaces and dim lighting.  Baseline: mod-severe Goal status: IN PROGRESS  Patient to score at least 25/30 on FGA in order to decrease risk of falls.  Baseline: 23 Goal status: IN PROGRESS    ASSESSMENT:  CLINICAL IMPRESSION: Demonstrates 100% HEP recall.  Proceeded with compound lift movements to maximize potential for strength gains and for benefit of weight bearing position and optimal body mechanics for functional lift patterns.  Did quite well with activities requiring verbal/visual cues in form and sequence.  Pt education in strength development progression of increased resistance vs repetition at outset of training. Continued sessions to refine HEP and advance balance and functional strength   OBJECTIVE IMPAIRMENTS decreased balance, decreased strength, postural dysfunction, and pain.   ACTIVITY LIMITATIONS  carrying, lifting, bending, squatting, and stairs  PARTICIPATION LIMITATIONS: meal prep, cleaning, laundry, shopping, community activity, and yard work  PERSONAL FACTORS Age, Past/current experiences, Time since onset of injury/illness/exacerbation, and 3+ comorbidities: asthma, depression, GERD, migraine, HLD, HTN, cervical fusion 2011, L5-S1 lami 2013, R  knee scope x3  are also affecting patient's functional outcome.   REHAB POTENTIAL: Good  CLINICAL DECISION MAKING: Evolving/moderate complexity  EVALUATION COMPLEXITY: Moderate   PLAN: PT FREQUENCY: 1-2x/week  PT DURATION: 6 weeks  PLANNED INTERVENTIONS: Therapeutic exercises, Therapeutic activity, Neuromuscular re-education, Balance training, Gait training, Patient/Family education, Self Care, Joint mobilization, Stair training, Vestibular training, Canalith repositioning, Aquatic Therapy, Dry Needling, Cryotherapy, Moist heat, Taping, Manual therapy, and Re-evaluation  PLAN FOR NEXT SESSION: Review functional lifts. Progress hip strength (deadlift: Benin vs traditional?) and high level balance; work on balance strategies, limits of stability towards LTGs  2:58 PM, 09/02/22 M. Sherlyn Lees, PT, DPT Physical Therapist- [place] Office Number: [phone]    [place] at [facility] [address], [place], [place] [address] Phone # [phone] Fax # [phone]

## 2022-09-07 ENCOUNTER — Ambulatory Visit: Payer: PPO

## 2022-09-07 ENCOUNTER — Ambulatory Visit: Payer: PPO | Admitting: Internal Medicine

## 2022-09-07 DIAGNOSIS — R2681 Unsteadiness on feet: Secondary | ICD-10-CM

## 2022-09-07 DIAGNOSIS — M6281 Muscle weakness (generalized): Secondary | ICD-10-CM

## 2022-09-07 NOTE — Progress Notes (Signed)
Future Appointments  Date Time Provider Department  09/08/2022 11:30 AM Unk Pinto, MD [facility]  09/30/2022                              cpe  3:00 PM Unk Pinto, MD [facility]  11/11/2022  2:00 PM Arfeen, Arlyce Harman, MD [facility]  01/06/2023                               wellness 11:00 AM Darrol Jump, NP [facility]    History of Present Illness:        Patient is a very nice 66 y.o. MWF with HTN, HLD, GERD, COPD , Migraines, Hypothyroidism, Prediabetes, Vitamin D Deficiency and long hx/o chronic Anxiety & Depression who presents with 8 week prodrome of Fatigue & myalgias.   Patient is followed by Dr Berniece Andreas  for  her major Depressive Disorder  on  Lamictal, Duloxetine, Clonazepam & Amitriptyline. She was referred by Dr Benjamine Mola, ENT for Physical Therapy for balance.    Medications    levothyroxine  50 MCG tablet, Take 1/2 tablet  Daily     losartan 50 MG tablet, Take  1 tablet  Daily                                                                                  rosuvastatin 40 MG tablet, Take  1 tablet  2 x /week                                                  albuterol HFA inhaler, Inhale 2 puffs  every 6 hours as needed    cetirizine 10 MG tablet, TAKE 1 TABLET  DAILY   FLONASE nasal spray, USE 2 SPRAYS IN EACH NOSTRIL EVERY EVENING   ADVAIR HFA 115-21  inhaler, Use  2 inhalations   2 x /day (every 12 hours)   montelukast 10 MG tablet, TAKE 1 TABLET  DAILY   meloxicam 15 MG tablet, Take  1 tablet Daily    amitriptyline  25 MG tablet, Take 1 tablet at bedtime.   VITAMIN D 4,000 Units , Take  daily.    CINNAMON , Take 1,000 Units  2  times daily.    clonazePAM  0.5 MG tablet, Take 1/2 to 1 tablet as needed for severe anxiety.   diclofenac  1 % GEL, Apply 4 g  4 times daily.   DULoxetine  20 MG capsule, Take 2 capsules daily.   lamoTRIgine \ 25 MG tablet, Take 1 tablet  2  times daily.   Magnesium 250 MG TABS, Take 3 tablets  daily.    nystatin  100,000 UNIT/ML susp,5  ML FOUR TIMES DAILY    pantoprazole 40 MG tablet, TAKE 1 TABLET  DAILY   PREMARIN vaginal cream, INSERT 1/2 GRAM VAGINALLY TWICE A WEEK  Problem list She has Hyperlipidemia, mixed; Hypertension; Asthma; GERD (gastroesophageal reflux disease); Hypothyroidism; Vitamin  D deficiency; Medication management; Other abnormal glucose (hx of prediabetes); Spondylolisthesis at L4-L5 level; Migraine; Depression, major, recurrent, in partial remission ([place]); Degenerative joint disease; Chronic back pain; Sleep apnea; and Deviated septum on their problem list.   Observations/Objective:  BP 120/68   Pulse 82   Temp 97.9 F (36.6 C)   Resp 16   Ht 5' 1.5" (1.562 m)   Wt 107 lb 3.2 oz (48.6 kg)   SpO2 99%   BMI 19.93 kg/m   HEENT - WNL. Neck - supple.  Chest - Clear equal BS. Cor - Nl HS. RRR w/o sig MGR. PP 1(+). No edema. MS- FROM w/o deformities.  Gait Nl. Neuro -  Nl w/o focal abnormalities.   Assessment and Plan:   1. Essential hypertension  - CBC with Differential/Platelet - COMPLETE METABOLIC PANEL WITH GFR - Magnesium  2. Hypothyroidism,  - TSH  3. Fatigue, unspecified type  - CBC with Differential/Platelet - Magnesium - TSH  4. Myalgia  - COMPLETE METABOLIC PANEL WITH GFR - Magnesium - TSH - Sedimentation rate - CK - C-reactive protein  5. Vitamin D deficiency  - VITAMIN D 25 Hydroxy  6. Medication management  - CBC with Differential/Platelet - COMPLETE METABOLIC PANEL WITH GFR - Magnesium - TSH - VITAMIN D 25 Hydroxy  - Sedimentation rate - CK - C-reactive protein  7. Flu vaccine need  - Flu vaccine HIGH DOSE PF (Fluzone High dose)  8. Need for pneumococcal vaccination  - Pneumococcal conjugate vaccine 20-valent (Prevnar 20)    Follow Up Instructions:        I discussed the assessment and treatment plan with the patient. The patient was provided an opportunity to ask questions and all were answered. The patient agreed with the plan and  demonstrated an understanding of the instructions.       The patient was advised to call back or seek an in-person evaluation if the symptoms worsen or if the condition fails to improve as anticipated.   Kirtland Bouchard, MD

## 2022-09-07 NOTE — Therapy (Signed)
OUTPATIENT PHYSICAL THERAPY VESTIBULAR TREATMENT     Patient Name: Sarah Rodriguez MRN: [id number] DOB:10-Feb-1956, 66 y.o., female Today's Date: 09/07/2022  PCP: Unk Pinto, MD REFERRING PROVIDER: Raylene Miyamoto, MD   PT End of Session - 09/07/22 1150     Visit Number 7    Number of Visits 13    Date for PT Re-Evaluation 09/23/22    Authorization Type [employer]    PT Start Time 1148    PT Stop Time 1230    PT Time Calculation (min) 42 min    Equipment Utilized During Treatment Gait belt    Activity Tolerance Patient tolerated treatment well    Behavior During Therapy WFL for tasks assessed/performed                Past Medical History:  Diagnosis Date   Asthma    Dulera daily   Chronic back pain    HNP   Complication of anesthesia    slow to wake up   Depression    takes Clonazepam nightly   GERD (gastroesophageal reflux disease)    takes Protonix daily    Heart murmur    History of migraine    takes Relpax daily as needed   HSV (herpes simplex virus) infection    Hyperlipidemia    takes Atorvastatin daily   Hypertension    takes Benicar daily   Hypothyroidism    takes Synthroid daily   MVP (mitral valve prolapse)    Pneumonia    hx of--been many yrs ago   Seasonal allergies    takes Allegra daily   Shortness of breath    with exertion   Past Surgical History:  Procedure Laterality Date   ABLATION  2011   Uterine   buttocks surgery     at age 30 (coccyx repair)   CERVICAL FUSION  03/2010   CESAREAN SECTION  1992   CHOLECYSTECTOMY  2008   COLONOSCOPY     DILATION AND CURETTAGE OF UTERUS  2011   ESOPHAGOGASTRODUODENOSCOPY     LUMBAR LAMINECTOMY/DECOMPRESSION MICRODISCECTOMY  10/24/2012   Procedure: LUMBAR LAMINECTOMY/DECOMPRESSION MICRODISCECTOMY 1 LEVEL;  Surgeon: Kristeen Miss, MD;  Location: [facility];  Service: Neurosurgery;  Laterality: Right;  Right Lumbar five-Sacral One Microdiskectomy   right knee arthroscopy     x 3    TONSILLECTOMY     at age 8   Patient Active Problem List   Diagnosis Date Noted   Sleep apnea 06/12/2020   Deviated septum 03/14/2018   Chronic back pain 12/21/2017   Depression, major, recurrent, in partial remission ([place]) 08/05/2016   Degenerative joint disease 08/05/2016   Migraine 03/03/2016   Spondylolisthesis at L4-L5 level 10/27/2015   Other abnormal glucose (hx of prediabetes) 12/12/2014   Vitamin D deficiency 04/17/2014   Medication management 04/17/2014   Hyperlipidemia, mixed    Hypertension    Asthma    GERD (gastroesophageal reflux disease)    Hypothyroidism     ONSET DATE: couple months ago  REFERRING DIAG: R42 (ICD-10-CM) - Dizziness and giddiness  THERAPY DIAG:  Unsteadiness on feet  Muscle weakness (generalized)  Rationale for Evaluation and Treatment Rehabilitation  SUBJECTIVE:   SUBJECTIVE STATEMENT: Very sore on inner thighs from sumo squats  Pt accompanied by: self  PERTINENT HISTORY: asthma, depression, GERD, migraine, HLD, HTN, cervical fusion 2011, L5-S1 lami 2013, R knee scope x3   PAIN:  Are you having pain?No  PRECAUTIONS: Fall   PATIENT GOALS improve balance and  LE strength  OBJECTIVE:   TODAY'S TREATMENT: 09/07/22 Activity Comments  Resisted walking: forward/backward X 2 min, 10#. X 2 min 15#  Sumo squat to goblet squat flow 2x5 reps 10#  Farmer's walk -casual 2x20 ft, 10# -march 4x20 ft, 10# -overhead carry/march 4x20 ft, 2# For balance and core stabilization  March with head turns/nods 2x20 ft, unable to maintain stability  Standing on foam EO/EC x 30 sec Head turns/nods EO/EC x 5 reps            HOME EXERCISE PROGRAM Last updated: 08/23/22 Access Code: [email or web address] URL: [email or web address] Date: 08/23/2022 Prepared by: [facility]  Exercises - Sidelying Hip Abduction  - 1 x daily - 5 x weekly - 2 sets - 10 reps - Sidelying Hip Adduction  - 1 x daily - 5 x  weekly - 2 sets - 10 reps - Romberg Stance Eyes Closed on Foam Pad  - 1 x daily - 5 x weekly - 2-3 sets - 30 sec hold - Standing Alternating Knee Flexion with Ankle Weights  - 1 x daily - 5 x weekly - 2 sets - 10 reps - Wall Quarter Squat with Swiss Ball  - 1 x daily - 5 x weekly - 2 sets - 10 reps - Romberg Stance on Foam Pad with Head Rotation  - 1 x daily - 5 x weekly - 2 sets - 10 reps - Romberg Stance with Head Nods on Foam Pad  - 1 x daily - 5 x weekly - 2 sets - 10 reps - Goblet Squat with Kettlebell  - 1 x daily - 7 x weekly - 5 sets - 5 reps - Sumo Squat with Dumbbell  - 1 x daily - 7 x weekly - 5 sets - 5 reps - Farmer's Walk  - 1 x daily - 7 x weekly - 3 sets - 10 reps       Below measures were taken at time of initial evaluation unless otherwise specified:   DIAGNOSTIC FINDINGS: none  COGNITION: Overall cognitive status: Within functional limits for tasks assessed   SENSATION: WFL  POSTURE: forward head; posterior weight shift in standing   LOWER EXTREMITY ROM:     Active  Right eval Left eval  Hip flexion    Hip extension    Hip abduction    Hip adduction    Hip internal rotation    Hip external rotation    Knee flexion    Knee extension    Ankle dorsiflexion 13 10  Ankle plantarflexion    Ankle inversion    Ankle eversion     (Blank rows = not tested)   LOWER EXTREMITY MMT:    MMT Right eval Left eval  Hip flexion 4+ 4+  Hip extension    Hip abduction 4- 4-  Hip adduction 4 4  Hip internal rotation    Hip external rotation    Knee flexion 3+ 4+  Knee extension 4+ 4+  Ankle dorsiflexion 4+ 4+  Ankle plantarflexion 4+ (19 reps) 4+ (15 reps)  Ankle inversion    Ankle eversion     (Blank rows = not tested)    GAIT: Gait pattern: step through pattern Assistive device utilized: None Level of assistance: Complete Independence   FUNCTIONAL TESTs:       M-CTSIB  Condition 1: Firm Surface, EO 30 Sec, Normal Sway  Condition 2: Firm  Surface, EC 30 Sec, Normal and Mild Sway  Condition 3: Foam Surface, EO 30 Sec, Moderate Sway  Condition 4: Foam Surface, EC 30 Sec, Moderate and Severe Sway     PATIENT SURVEYS:  FOTO NT d/t patient declining dizziness   HOME EXERCISE PROGRAM Access Code: [email or web address] URL: [email or web address] Date: 08/12/2022 Prepared by: [facility]  Exercises - Sidelying Hip Abduction  - 1 x daily - 5 x weekly - 2 sets - 10 reps - Sidelying Hip Adduction  - 1 x daily - 5 x weekly - 2 sets - 10 reps - Standing Hamstring Curl with Resistance  - 1 x daily - 5 x weekly - 2 sets - 10 reps - Squat with Chair Touch  - 1 x daily - 5 x weekly - 2 sets - 10 reps - Romberg Stance on Foam Pad  - 1 x daily - 5 x weekly - 2-3 sets - 30 sec hold - Romberg Stance Eyes Closed on Foam Pad  - 1 x daily - 5 x weekly - 2-3 sets - 30 sec hold    PATIENT EDUCATION: Education details: prognosis, POC, HEP Person educated: Patient Education method: Explanation, Demonstration, Tactile cues, Verbal cues, and Handouts Education comprehension: verbalized understanding and returned demonstration   GOALS: Goals reviewed with patient? Yes  SHORT TERM GOALS: Target date: 09/02/2022  Patient to be independent with initial HEP. Baseline: HEP initiated Goal status: MET  LONG TERM GOALS: Target date: 09/23/2022  Patient to be independent with advanced HEP. Baseline: Not yet initiated  Goal status: IN PROGRESS  Patient to score at least 4/5 with LE MMT.  Baseline: see above Goal status: IN PROGRESS  Patient to demonstrate mild sway with M-CTSIB condition with eyes closed/foam surface in order to improve safety in environments with uneven surfaces and dim lighting.  Baseline: mod-severe Goal status: IN PROGRESS  Patient to score at least 25/30 on FGA in order to decrease risk of falls.  Baseline: 23 Goal status: IN PROGRESS    ASSESSMENT:  CLINICAL  IMPRESSION: Review of compound lift movements for strength and body mechanics. Advised to complete every other to every 2-3 days for strength gains. Difficulty with maintaining balance in single limb position and when head turns incorporated into movement. Continued sessions to progress dynamic balance and LE strength for improved balance and pt perceived sense of well-being   OBJECTIVE IMPAIRMENTS decreased balance, decreased strength, postural dysfunction, and pain.   ACTIVITY LIMITATIONS carrying, lifting, bending, squatting, and stairs  PARTICIPATION LIMITATIONS: meal prep, cleaning, laundry, shopping, community activity, and yard work  PERSONAL FACTORS Age, Past/current experiences, Time since onset of injury/illness/exacerbation, and 3+ comorbidities: asthma, depression, GERD, migraine, HLD, HTN, cervical fusion 2011, L5-S1 lami 2013, R knee scope x3  are also affecting patient's functional outcome.   REHAB POTENTIAL: Good  CLINICAL DECISION MAKING: Evolving/moderate complexity  EVALUATION COMPLEXITY: Moderate   PLAN: PT FREQUENCY: 1-2x/week  PT DURATION: 6 weeks  PLANNED INTERVENTIONS: Therapeutic exercises, Therapeutic activity, Neuromuscular re-education, Balance training, Gait training, Patient/Family education, Self Care, Joint mobilization, Stair training, Vestibular training, Canalith repositioning, Aquatic Therapy, Dry Needling, Cryotherapy, Moist heat, Taping, Manual therapy, and Re-evaluation  PLAN FOR NEXT SESSION: Review functional lifts. Progress hip strength (deadlift: Benin vs traditional?) and high level balance; work on balance strategies, limits of stability towards LTGs  11:50 AM, 09/07/22 M. Sherlyn Lees, PT, DPT Physical Therapist- [place] Office Number: [phone]    [place] at [facility] [place], [place], [place]  [place] Phone # [phone] Fax # [phone]

## 2022-09-08 ENCOUNTER — Ambulatory Visit (INDEPENDENT_AMBULATORY_CARE_PROVIDER_SITE_OTHER): Payer: PPO | Admitting: Internal Medicine

## 2022-09-08 ENCOUNTER — Encounter: Payer: Self-pay | Admitting: Internal Medicine

## 2022-09-08 VITALS — BP 120/68 | HR 82 | Temp 97.9°F | Resp 16 | Ht 61.5 in | Wt 107.2 lb

## 2022-09-08 DIAGNOSIS — M791 Myalgia, unspecified site: Secondary | ICD-10-CM | POA: Diagnosis not present

## 2022-09-08 DIAGNOSIS — R5383 Other fatigue: Secondary | ICD-10-CM

## 2022-09-08 DIAGNOSIS — I10 Essential (primary) hypertension: Secondary | ICD-10-CM

## 2022-09-08 DIAGNOSIS — Z23 Encounter for immunization: Secondary | ICD-10-CM

## 2022-09-08 DIAGNOSIS — E559 Vitamin D deficiency, unspecified: Secondary | ICD-10-CM | POA: Diagnosis not present

## 2022-09-08 DIAGNOSIS — Z79899 Other long term (current) drug therapy: Secondary | ICD-10-CM

## 2022-09-08 DIAGNOSIS — E039 Hypothyroidism, unspecified: Secondary | ICD-10-CM | POA: Diagnosis not present

## 2022-09-09 ENCOUNTER — Ambulatory Visit: Payer: PPO

## 2022-09-09 LAB — COMPLETE METABOLIC PANEL WITH GFR
AG Ratio: 2.5 (calc) (ref 1.0–2.5)
ALT: 20 U/L (ref 6–29)
AST: 22 U/L (ref 10–35)
Albumin: 5 g/dL (ref 3.6–5.1)
Alkaline phosphatase (APISO): 59 U/L (ref 37–153)
BUN: 16 mg/dL (ref 7–25)
CO2: 29 mmol/L (ref 20–32)
Calcium: 10 mg/dL (ref 8.6–10.4)
Chloride: 105 mmol/L (ref 98–110)
Creat: 1.03 mg/dL (ref 0.50–1.05)
Globulin: 2 g/dL (calc) (ref 1.9–3.7)
Glucose, Bld: 93 mg/dL (ref 65–99)
Potassium: 4.2 mmol/L (ref 3.5–5.3)
Sodium: 145 mmol/L (ref 135–146)
Total Bilirubin: 0.4 mg/dL (ref 0.2–1.2)
Total Protein: 7 g/dL (ref 6.1–8.1)
eGFR: 60 mL/min/{1.73_m2} (ref >=60)

## 2022-09-09 LAB — CBC WITH DIFFERENTIAL/PLATELET
Absolute Monocytes: 371 cells/uL (ref 200–950)
Basophils Absolute: 32 cells/uL (ref 0–200)
Basophils Relative: 0.5 %
Eosinophils Absolute: 250 cells/uL (ref 15–500)
Eosinophils Relative: 3.9 %
HCT: 43.9 % (ref 35.0–45.0)
Hemoglobin: 14.9 g/dL (ref 11.7–15.5)
Lymphs Abs: 1670 cells/uL (ref 850–3900)
MCH: 31.8 pg (ref 27.0–33.0)
MCHC: 33.9 g/dL (ref 32.0–36.0)
MCV: 93.6 fL (ref 80.0–100.0)
MPV: 10.6 fL (ref 7.5–12.5)
Monocytes Relative: 5.8 %
Neutro Abs: 4077 cells/uL (ref 1500–7800)
Neutrophils Relative %: 63.7 %
Platelets: 246 10*3/uL (ref 140–400)
RBC: 4.69 10*6/uL (ref 3.80–5.10)
RDW: 11.3 % (ref 11.0–15.0)
Total Lymphocyte: 26.1 %
WBC: 6.4 10*3/uL (ref 3.8–10.8)

## 2022-09-09 LAB — MAGNESIUM: Magnesium: 2.2 mg/dL (ref 1.5–2.5)

## 2022-09-09 LAB — VITAMIN D 25 HYDROXY (VIT D DEFICIENCY, FRACTURES): Vit D, 25-Hydroxy: 89 ng/mL (ref 30–100)

## 2022-09-09 LAB — C-REACTIVE PROTEIN: CRP: 0.7 mg/L (ref <8.0)

## 2022-09-09 LAB — TSH: TSH: 1.87 mIU/L (ref 0.40–4.50)

## 2022-09-09 LAB — SEDIMENTATION RATE: Sed Rate: 2 mm/h (ref 0–30)

## 2022-09-09 LAB — CK: Total CK: 45 U/L (ref 29–143)

## 2022-09-09 NOTE — Progress Notes (Signed)
<><><><><><><><><><><><><><><><><><><><><><><><><><><><><><><><><> <><><><><><><><><><><><><><><><><><><><><><><><><><><><><><><><><> -   Test results slightly outside the reference range are not unusual. If there is anything important, I will review this with you,  otherwise it is considered normal test values.  If you have further questions,  please do not hesitate to contact me at the office or via My Chart.  <><><><><><><><><><><><><><><><><><><><><><><><><><><><><><><><><> <><><><><><><><><><><><><><><><><><><><><><><><><><><><><><><><><>  -  All tests returned Perfectly Normal   !  - CBC - Normal - Nl WBC & Hgb - No Infection or Anemia  - Magnesium - Perfect   - Thyroid - Perfect   - Sed Rate  & CRP - Both low & Normal                                     -No sign of inflammatory or auto immune process  - CPK - muscle enzyme - Normal   - Chem  Panel - Glucose  - Kidneys - Electrolytes - Liver - Magnesium & Thyroid    - all  Normal / OK  - Vitamin D = 89 - Excellent - Please keep dose same   <><><><><><><><><><><><><><><><><><><><><><><><><><><><><><><><><> <><><><><><><><><><><><><><><><><><><><><><><><><><><><><><><><><>

## 2022-09-10 ENCOUNTER — Ambulatory Visit: Payer: PPO

## 2022-09-10 DIAGNOSIS — R2681 Unsteadiness on feet: Secondary | ICD-10-CM

## 2022-09-10 DIAGNOSIS — M6281 Muscle weakness (generalized): Secondary | ICD-10-CM

## 2022-09-10 NOTE — Therapy (Signed)
OUTPATIENT PHYSICAL THERAPY VESTIBULAR TREATMENT     Patient Name: Sarah Rodriguez MRN: [id number] DOB:1955-12-18, 66 y.o., female Today's Date: 09/10/2022  PCP: Unk Pinto, MD REFERRING PROVIDER: Raylene Miyamoto, MD   PT End of Session - 09/10/22 0930     Visit Number 8    Number of Visits 13    Date for PT Re-Evaluation 09/23/22    Authorization Type [employer]    PT Start Time 0930    PT Stop Time 1015    PT Time Calculation (min) 45 min    Equipment Utilized During Treatment Gait belt    Activity Tolerance Patient tolerated treatment well    Behavior During Therapy WFL for tasks assessed/performed                Past Medical History:  Diagnosis Date   Asthma    Dulera daily   Chronic back pain    HNP   Complication of anesthesia    slow to wake up   Depression    takes Clonazepam nightly   GERD (gastroesophageal reflux disease)    takes Protonix daily    Heart murmur    History of migraine    takes Relpax daily as needed   HSV (herpes simplex virus) infection    Hyperlipidemia    takes Atorvastatin daily   Hypertension    takes Benicar daily   Hypothyroidism    takes Synthroid daily   MVP (mitral valve prolapse)    Pneumonia    hx of--been many yrs ago   Seasonal allergies    takes Allegra daily   Shortness of breath    with exertion   Past Surgical History:  Procedure Laterality Date   ABLATION  2011   Uterine   buttocks surgery     at age 74 (coccyx repair)   CERVICAL FUSION  03/2010   CESAREAN SECTION  1992   CHOLECYSTECTOMY  2008   COLONOSCOPY     DILATION AND CURETTAGE OF UTERUS  2011   ESOPHAGOGASTRODUODENOSCOPY     LUMBAR LAMINECTOMY/DECOMPRESSION MICRODISCECTOMY  10/24/2012   Procedure: LUMBAR LAMINECTOMY/DECOMPRESSION MICRODISCECTOMY 1 LEVEL;  Surgeon: Kristeen Miss, MD;  Location: [facility];  Service: Neurosurgery;  Laterality: Right;  Right Lumbar five-Sacral One Microdiskectomy   right knee arthroscopy     x 3    TONSILLECTOMY     at age 4   Patient Active Problem List   Diagnosis Date Noted   Sleep apnea 06/12/2020   Deviated septum 03/14/2018   Chronic back pain 12/21/2017   Depression, major, recurrent, in partial remission ([place]) 08/05/2016   Degenerative joint disease 08/05/2016   Migraine 03/03/2016   Spondylolisthesis at L4-L5 level 10/27/2015   Other abnormal glucose (hx of prediabetes) 12/12/2014   Vitamin D deficiency 04/17/2014   Medication management 04/17/2014   Hyperlipidemia, mixed    Hypertension    Asthma    GERD (gastroesophageal reflux disease)    Hypothyroidism     ONSET DATE: couple months ago  REFERRING DIAG: R42 (ICD-10-CM) - Dizziness and giddiness  THERAPY DIAG:  Unsteadiness on feet  Muscle weakness (generalized)  Rationale for Evaluation and Treatment Rehabilitation  SUBJECTIVE:   SUBJECTIVE STATEMENT: Using good body mechanics   Pt accompanied by: self  PERTINENT HISTORY: asthma, depression, GERD, migraine, HLD, HTN, cervical fusion 2011, L5-S1 lami 2013, R knee scope x3   PAIN:  Are you having pain?Just sore from yardwork yesterday  PRECAUTIONS: Fall   PATIENT GOALS improve balance  and LE strength  OBJECTIVE:   TODAY'S TREATMENT: 09/10/22 Activity Comments  Farmer's carry march 10# unilat X 2 min  Sit-stand goblet squat 10# 3x12  Sumo squat 3x10 15# kettlebell +4" box  Sidelying hip abd/add 3x12 3#  Hi-step march with head turns and then backwards walk 4x20 ft Cues for gaze fixation  Standing on foam -EO withstanding postural perturations -EC x 30 sec -head turns 5x EO/EC     TODAY'S TREATMENT: 09/07/22 Activity Comments  Resisted walking: forward/backward X 2 min, 10#. X 2 min 15#  Sumo squat to goblet squat flow 2x5 reps 10#  Farmer's walk -casual 2x20 ft, 10# -march 4x20 ft, 10# -overhead carry/march 4x20 ft, 2# For balance and core stabilization  March with head turns/nods 2x20 ft, unable to maintain stability  Standing  on foam EO/EC x 30 sec Head turns/nods EO/EC x 5 reps            HOME EXERCISE PROGRAM Last updated: 08/23/22 Access Code: [email or web address] URL: [email or web address] Date: 08/23/2022 Prepared by: [facility]  Exercises - Sidelying Hip Abduction  - 1 x daily - 5 x weekly - 2 sets - 10 reps - Sidelying Hip Adduction  - 1 x daily - 5 x weekly - 2 sets - 10 reps - Romberg Stance Eyes Closed on Foam Pad  - 1 x daily - 5 x weekly - 2-3 sets - 30 sec hold - Standing Alternating Knee Flexion with Ankle Weights  - 1 x daily - 5 x weekly - 2 sets - 10 reps - Wall Quarter Squat with Swiss Ball  - 1 x daily - 5 x weekly - 2 sets - 10 reps - Romberg Stance on Foam Pad with Head Rotation  - 1 x daily - 5 x weekly - 2 sets - 10 reps - Romberg Stance with Head Nods on Foam Pad  - 1 x daily - 5 x weekly - 2 sets - 10 reps - Goblet Squat with Kettlebell  - 1 x daily - 7 x weekly - 5 sets - 5 reps - Sumo Squat with Dumbbell  - 1 x daily - 7 x weekly - 5 sets - 5 reps - Farmer's Walk  - 1 x daily - 7 x weekly - 3 sets - 10 reps       Below measures were taken at time of initial evaluation unless otherwise specified:   DIAGNOSTIC FINDINGS: none  COGNITION: Overall cognitive status: Within functional limits for tasks assessed   SENSATION: WFL  POSTURE: forward head; posterior weight shift in standing   LOWER EXTREMITY ROM:     Active  Right eval Left eval  Hip flexion    Hip extension    Hip abduction    Hip adduction    Hip internal rotation    Hip external rotation    Knee flexion    Knee extension    Ankle dorsiflexion 13 10  Ankle plantarflexion    Ankle inversion    Ankle eversion     (Blank rows = not tested)   LOWER EXTREMITY MMT:    MMT Right eval Left eval  Hip flexion 4+ 4+  Hip extension    Hip abduction 4- 4-  Hip adduction 4 4  Hip internal rotation    Hip external rotation    Knee flexion 3+ 4+  Knee  extension 4+ 4+  Ankle dorsiflexion 4+ 4+  Ankle plantarflexion 4+ (19 reps) 4+ (  15 reps)  Ankle inversion    Ankle eversion     (Blank rows = not tested)    GAIT: Gait pattern: step through pattern Assistive device utilized: None Level of assistance: Complete Independence   FUNCTIONAL TESTs:       M-CTSIB  Condition 1: Firm Surface, EO 30 Sec, Normal Sway  Condition 2: Firm Surface, EC 30 Sec, Normal and Mild Sway  Condition 3: Foam Surface, EO 30 Sec, Moderate Sway  Condition 4: Foam Surface, EC 30 Sec, Moderate and Severe Sway     PATIENT SURVEYS:  FOTO NT d/t patient declining dizziness   HOME EXERCISE PROGRAM Access Code: [email or web address] URL: [email or web address] Date: 08/12/2022 Prepared by: [facility]  Exercises - Sidelying Hip Abduction  - 1 x daily - 5 x weekly - 2 sets - 10 reps - Sidelying Hip Adduction  - 1 x daily - 5 x weekly - 2 sets - 10 reps - Standing Hamstring Curl with Resistance  - 1 x daily - 5 x weekly - 2 sets - 10 reps - Squat with Chair Touch  - 1 x daily - 5 x weekly - 2 sets - 10 reps - Romberg Stance on Foam Pad  - 1 x daily - 5 x weekly - 2-3 sets - 30 sec hold - Romberg Stance Eyes Closed on Foam Pad  - 1 x daily - 5 x weekly - 2-3 sets - 30 sec hold    PATIENT EDUCATION: Education details: prognosis, POC, HEP Person educated: Patient Education method: Explanation, Demonstration, Tactile cues, Verbal cues, and Handouts Education comprehension: verbalized understanding and returned demonstration   GOALS: Goals reviewed with patient? Yes  SHORT TERM GOALS: Target date: 09/02/2022  Patient to be independent with initial HEP. Baseline: HEP initiated Goal status: MET  LONG TERM GOALS: Target date: 09/23/2022  Patient to be independent with advanced HEP. Baseline: Not yet initiated  Goal status: IN PROGRESS  Patient to score at least 4/5 with LE MMT.  Baseline: see above Goal  status: IN PROGRESS  Patient to demonstrate mild sway with M-CTSIB condition with eyes closed/foam surface in order to improve safety in environments with uneven surfaces and dim lighting.  Baseline: mod-severe Goal status: IN PROGRESS  Patient to score at least 25/30 on FGA in order to decrease risk of falls.  Baseline: 23 Goal status: IN PROGRESS    ASSESSMENT:  CLINICAL IMPRESSION: Demonstrating excellent body mechanics for functional lifts with less reliance on external cues, e.g. chair for squats and box for deadlifts.  Good form and control with isolated open chain movements and demonstrating improved form with decrease need for cues and demonstration. Able to increase in resistance and reps without ill effects. Continues to demo most profound deficits with single leg balance and with head movement challenges during mobility and/or standing on compliant surfaces   OBJECTIVE IMPAIRMENTS decreased balance, decreased strength, postural dysfunction, and pain.   ACTIVITY LIMITATIONS carrying, lifting, bending, squatting, and stairs  PARTICIPATION LIMITATIONS: meal prep, cleaning, laundry, shopping, community activity, and yard work  PERSONAL FACTORS Age, Past/current experiences, Time since onset of injury/illness/exacerbation, and 3+ comorbidities: asthma, depression, GERD, migraine, HLD, HTN, cervical fusion 2011, L5-S1 lami 2013, R knee scope x3  are also affecting patient's functional outcome.   REHAB POTENTIAL: Good  CLINICAL DECISION MAKING: Evolving/moderate complexity  EVALUATION COMPLEXITY: Moderate   PLAN: PT FREQUENCY: 1-2x/week  PT DURATION: 6 weeks  PLANNED INTERVENTIONS: Therapeutic exercises, Therapeutic activity, Neuromuscular  re-education, Balance training, Gait training, Patient/Family education, Self Care, Joint mobilization, Stair training, Vestibular training, Canalith repositioning, Aquatic Therapy, Dry Needling, Cryotherapy, Moist heat, Taping, Manual  therapy, and Re-evaluation  PLAN FOR NEXT SESSION: Review functional lifts. Progress hip strength (deadlift: Benin vs traditional?) and high level balance; work on balance strategies, limits of stability towards LTGs  9:30 AM, 09/10/22 M. Sherlyn Lees, PT, DPT Physical Therapist- [place] Office Number: [phone]    [place] at [facility] [address] [place], [place] [address] Phone # [phone] Fax # [phone]

## 2022-09-14 ENCOUNTER — Ambulatory Visit: Payer: PPO

## 2022-09-14 DIAGNOSIS — R2681 Unsteadiness on feet: Secondary | ICD-10-CM

## 2022-09-14 DIAGNOSIS — M6281 Muscle weakness (generalized): Secondary | ICD-10-CM

## 2022-09-14 NOTE — Therapy (Signed)
OUTPATIENT PHYSICAL THERAPY VESTIBULAR TREATMENT     Patient Name: Sarah Rodriguez MRN: [id number] DOB:12-08-1955, 66 y.o., female Today's Date: 09/14/2022  PCP: Unk Pinto, MD REFERRING PROVIDER: Raylene Miyamoto, MD   PT End of Session - 09/14/22 1401     Visit Number 9    Number of Visits 13    Date for PT Re-Evaluation 09/23/22    Authorization Type [employer]    PT Start Time 1400    PT Stop Time 1445    PT Time Calculation (min) 45 min    Equipment Utilized During Treatment Gait belt    Activity Tolerance Patient tolerated treatment well    Behavior During Therapy WFL for tasks assessed/performed                Past Medical History:  Diagnosis Date   Asthma    Dulera daily   Chronic back pain    HNP   Complication of anesthesia    slow to wake up   Depression    takes Clonazepam nightly   GERD (gastroesophageal reflux disease)    takes Protonix daily    Heart murmur    History of migraine    takes Relpax daily as needed   HSV (herpes simplex virus) infection    Hyperlipidemia    takes Atorvastatin daily   Hypertension    takes Benicar daily   Hypothyroidism    takes Synthroid daily   MVP (mitral valve prolapse)    Pneumonia    hx of--been many yrs ago   Seasonal allergies    takes Allegra daily   Shortness of breath    with exertion   Past Surgical History:  Procedure Laterality Date   ABLATION  2011   Uterine   buttocks surgery     at age 71 (coccyx repair)   CERVICAL FUSION  03/2010   CESAREAN SECTION  1992   CHOLECYSTECTOMY  2008   COLONOSCOPY     DILATION AND CURETTAGE OF UTERUS  2011   ESOPHAGOGASTRODUODENOSCOPY     LUMBAR LAMINECTOMY/DECOMPRESSION MICRODISCECTOMY  10/24/2012   Procedure: LUMBAR LAMINECTOMY/DECOMPRESSION MICRODISCECTOMY 1 LEVEL;  Surgeon: Kristeen Miss, MD;  Location: [facility];  Service: Neurosurgery;  Laterality: Right;  Right Lumbar five-Sacral One Microdiskectomy   right knee arthroscopy     x 3    TONSILLECTOMY     at age 51   Patient Active Problem List   Diagnosis Date Noted   Sleep apnea 06/12/2020   Deviated septum 03/14/2018   Chronic back pain 12/21/2017   Depression, major, recurrent, in partial remission ([place]) 08/05/2016   Degenerative joint disease 08/05/2016   Migraine 03/03/2016   Spondylolisthesis at L4-L5 level 10/27/2015   Vitamin D deficiency 04/17/2014   Medication management 04/17/2014   Hyperlipidemia, mixed    Hypertension    Asthma    GERD (gastroesophageal reflux disease)    Hypothyroidism     ONSET DATE: couple months ago  REFERRING DIAG: R42 (ICD-10-CM) - Dizziness and giddiness  THERAPY DIAG:  Unsteadiness on feet  Muscle weakness (generalized)  Rationale for Evaluation and Treatment Rehabilitation  SUBJECTIVE:   SUBJECTIVE STATEMENT: Feeling pretty good, did not do exercises over the weekend   Pt accompanied by: self  PERTINENT HISTORY: asthma, depression, GERD, migraine, HLD, HTN, cervical fusion 2011, L5-S1 lami 2013, R knee scope x3   PAIN:  Are you having pain?Just sore from yardwork yesterday  PRECAUTIONS: Fall   PATIENT GOALS improve balance and LE strength  OBJECTIVE:   TODAY'S TREATMENT: 09/14/22 Activity Comments  Mini-squats 12 reps  Sumo-squats 15# KB  3x5, good form, full range  Farmer's carry 4x20 ft 10# Attempted 15# but too much compensation with trunk  Standing on foam -feet together EO/EC x 15 sec -heead turns EO/EC -semi-tandem w/ head turns  Retrowalk, tandem walk 6x20 ft, 2x20 with with ball bounce  Tandem walk with ball toss, head turns,       TODAY'S TREATMENT: 09/10/22 Activity Comments  Farmer's carry march 10# unilat X 2 min  Sit-stand goblet squat 10# 3x12  Sumo squat 3x10 15# kettlebell +4" box  Sidelying hip abd/add 3x12 3#  Hi-step march with head turns and then backwards walk 4x20 ft Cues for gaze fixation  Standing on foam -EO withstanding postural perturations -EC x 30 sec -head  turns 5x EO/EC     TODAY'S TREATMENT: 09/07/22 Activity Comments  Resisted walking: forward/backward X 2 min, 10#. X 2 min 15#  Sumo squat to goblet squat flow 2x5 reps 10#  Farmer's walk -casual 2x20 ft, 10# -march 4x20 ft, 10# -overhead carry/march 4x20 ft, 2# For balance and core stabilization  March with head turns/nods 2x20 ft, unable to maintain stability  Standing on foam EO/EC x 30 sec Head turns/nods EO/EC x 5 reps            HOME EXERCISE PROGRAM Last updated: 08/23/22 Access Code: [email or web address] URL: [email or web address] Date: 08/23/2022 Prepared by: [facility]  Exercises - Sidelying Hip Abduction  - 1 x daily - 5 x weekly - 2 sets - 10 reps - Sidelying Hip Adduction  - 1 x daily - 5 x weekly - 2 sets - 10 reps - Romberg Stance Eyes Closed on Foam Pad  - 1 x daily - 5 x weekly - 2-3 sets - 30 sec hold - Standing Alternating Knee Flexion with Ankle Weights  - 1 x daily - 5 x weekly - 2 sets - 10 reps - Wall Quarter Squat with Swiss Ball  - 1 x daily - 5 x weekly - 2 sets - 10 reps - Romberg Stance on Foam Pad with Head Rotation  - 1 x daily - 5 x weekly - 2 sets - 10 reps - Romberg Stance with Head Nods on Foam Pad  - 1 x daily - 5 x weekly - 2 sets - 10 reps - Goblet Squat with Kettlebell  - 1 x daily - 7 x weekly - 5 sets - 5 reps - Sumo Squat with Dumbbell  - 1 x daily - 7 x weekly - 5 sets - 5 reps - Farmer's Walk  - 1 x daily - 7 x weekly - 3 sets - 10 reps - Tandem Walking with Head Rotations  - 1 x daily - 7 x weekly - 3 sets - 10 reps       Below measures were taken at time of initial evaluation unless otherwise specified:   DIAGNOSTIC FINDINGS: none  COGNITION: Overall cognitive status: Within functional limits for tasks assessed   SENSATION: WFL  POSTURE: forward head; posterior weight shift in standing   LOWER EXTREMITY ROM:     Active  Right eval Left eval  Hip flexion    Hip  extension    Hip abduction    Hip adduction    Hip internal rotation    Hip external rotation    Knee flexion    Knee extension    Ankle  dorsiflexion 13 10  Ankle plantarflexion    Ankle inversion    Ankle eversion     (Blank rows = not tested)   LOWER EXTREMITY MMT:    MMT Right eval Left eval  Hip flexion 4+ 4+  Hip extension    Hip abduction 4- 4-  Hip adduction 4 4  Hip internal rotation    Hip external rotation    Knee flexion 3+ 4+  Knee extension 4+ 4+  Ankle dorsiflexion 4+ 4+  Ankle plantarflexion 4+ (19 reps) 4+ (15 reps)  Ankle inversion    Ankle eversion     (Blank rows = not tested)    GAIT: Gait pattern: step through pattern Assistive device utilized: None Level of assistance: Complete Independence   FUNCTIONAL TESTs:       M-CTSIB  Condition 1: Firm Surface, EO 30 Sec, Normal Sway  Condition 2: Firm Surface, EC 30 Sec, Normal and Mild Sway  Condition 3: Foam Surface, EO 30 Sec, Moderate Sway  Condition 4: Foam Surface, EC 30 Sec, Moderate and Severe Sway     PATIENT SURVEYS:  FOTO NT d/t patient declining dizziness   HOME EXERCISE PROGRAM Access Code: [email or web address] URL: [email or web address] Date: 08/12/2022 Prepared by: [facility]  Exercises - Sidelying Hip Abduction  - 1 x daily - 5 x weekly - 2 sets - 10 reps - Sidelying Hip Adduction  - 1 x daily - 5 x weekly - 2 sets - 10 reps - Standing Hamstring Curl with Resistance  - 1 x daily - 5 x weekly - 2 sets - 10 reps - Squat with Chair Touch  - 1 x daily - 5 x weekly - 2 sets - 10 reps - Romberg Stance on Foam Pad  - 1 x daily - 5 x weekly - 2-3 sets - 30 sec hold - Romberg Stance Eyes Closed on Foam Pad  - 1 x daily - 5 x weekly - 2-3 sets - 30 sec hold    PATIENT EDUCATION: Education details: prognosis, POC, HEP Person educated: Patient Education method: Explanation, Demonstration, Tactile cues, Verbal cues, and  Handouts Education comprehension: verbalized understanding and returned demonstration   GOALS: Goals reviewed with patient? Yes  SHORT TERM GOALS: Target date: 09/02/2022  Patient to be independent with initial HEP. Baseline: HEP initiated Goal status: MET  LONG TERM GOALS: Target date: 09/23/2022  Patient to be independent with advanced HEP. Baseline: Not yet initiated  Goal status: IN PROGRESS  Patient to score at least 4/5 with LE MMT.  Baseline: see above Goal status: IN PROGRESS  Patient to demonstrate mild sway with M-CTSIB condition with eyes closed/foam surface in order to improve safety in environments with uneven surfaces and dim lighting.  Baseline: mod-severe Goal status: IN PROGRESS  Patient to score at least 25/30 on FGA in order to decrease risk of falls.  Baseline: 23 Goal status: IN PROGRESS    ASSESSMENT:  CLINICAL IMPRESSION: Treatment focus on improving proximal strength and single limb support with increase in resistance for compound lifts to good effect and maintaining good body mechanics.  Balance activities find deficits with head turns and maintaining balance w/ narrow BOS. Continued with drills to improve motor coordination and requiring motor multitasking with breakdown in balance and fine motor control under these demands. Continued sessions to refine HEP for comprehensive balance and strength activities for home performance.    OBJECTIVE IMPAIRMENTS decreased balance, decreased strength, postural dysfunction, and pain.  ACTIVITY LIMITATIONS carrying, lifting, bending, squatting, and stairs  PARTICIPATION LIMITATIONS: meal prep, cleaning, laundry, shopping, community activity, and yard work  PERSONAL FACTORS Age, Past/current experiences, Time since onset of injury/illness/exacerbation, and 3+ comorbidities: asthma, depression, GERD, migraine, HLD, HTN, cervical fusion 2011, L5-S1 lami 2013, R knee scope x3  are also affecting patient's  functional outcome.   REHAB POTENTIAL: Good  CLINICAL DECISION MAKING: Evolving/moderate complexity  EVALUATION COMPLEXITY: Moderate   PLAN: PT FREQUENCY: 1-2x/week  PT DURATION: 6 weeks  PLANNED INTERVENTIONS: Therapeutic exercises, Therapeutic activity, Neuromuscular re-education, Balance training, Gait training, Patient/Family education, Self Care, Joint mobilization, Stair training, Vestibular training, Canalith repositioning, Aquatic Therapy, Dry Needling, Cryotherapy, Moist heat, Taping, Manual therapy, and Re-evaluation  PLAN FOR NEXT SESSION: Review functional lifts. Progress hip strength (deadlift: Benin vs traditional?) and high level balance; work on balance strategies, limits of stability towards LTGs  2:02 PM, 09/14/22 M. Sherlyn Lees, PT, DPT Physical Therapist- [place] Office Number: [phone]    [place] at [facility] [address], [place], [place] [address] Phone # [phone] Fax # [phone]

## 2022-09-16 ENCOUNTER — Ambulatory Visit: Payer: PPO

## 2022-09-20 ENCOUNTER — Ambulatory Visit: Payer: PPO | Admitting: Physical Therapy

## 2022-09-20 ENCOUNTER — Encounter: Payer: Self-pay | Admitting: Physical Therapy

## 2022-09-20 DIAGNOSIS — R2681 Unsteadiness on feet: Secondary | ICD-10-CM | POA: Diagnosis not present

## 2022-09-20 DIAGNOSIS — M6281 Muscle weakness (generalized): Secondary | ICD-10-CM

## 2022-09-20 NOTE — Therapy (Signed)
OUTPATIENT PHYSICAL THERAPY VESTIBULAR TREATMENT/PROGRESS NOTE     Patient Name: Sarah Rodriguez MRN: [id number] DOB:1956-01-22, 66 y.o., female Today's Date: 09/20/2022  PCP: Unk Pinto, MD REFERRING PROVIDER: Raylene Miyamoto, MD  Progress Note  Reporting Period 08/12/2022 to 09/20/2022  See note below for Objective Data and Assessment of Progress/Goals.       PT End of Session - 09/20/22 1449     Visit Number 10    Number of Visits 13    Date for PT Re-Evaluation 09/23/22    Authorization Type [employer]    Progress Note Due on Visit 10    PT Start Time 1450    PT Stop Time 1530    PT Time Calculation (min) 40 min    Equipment Utilized During Treatment --    Activity Tolerance Patient tolerated treatment well    Behavior During Therapy WFL for tasks assessed/performed                 Past Medical History:  Diagnosis Date   Asthma    Dulera daily   Chronic back pain    HNP   Complication of anesthesia    slow to wake up   Depression    takes Clonazepam nightly   GERD (gastroesophageal reflux disease)    takes Protonix daily    Heart murmur    History of migraine    takes Relpax daily as needed   HSV (herpes simplex virus) infection    Hyperlipidemia    takes Atorvastatin daily   Hypertension    takes Benicar daily   Hypothyroidism    takes Synthroid daily   MVP (mitral valve prolapse)    Pneumonia    hx of--been many yrs ago   Seasonal allergies    takes Allegra daily   Shortness of breath    with exertion   Past Surgical History:  Procedure Laterality Date   ABLATION  2011   Uterine   buttocks surgery     at age 23 (coccyx repair)   CERVICAL FUSION  03/2010   CESAREAN SECTION  1992   CHOLECYSTECTOMY  2008   COLONOSCOPY     DILATION AND CURETTAGE OF UTERUS  2011   ESOPHAGOGASTRODUODENOSCOPY     LUMBAR LAMINECTOMY/DECOMPRESSION MICRODISCECTOMY  10/24/2012   Procedure: LUMBAR LAMINECTOMY/DECOMPRESSION MICRODISCECTOMY 1 LEVEL;   Surgeon: Kristeen Miss, MD;  Location: [facility];  Service: Neurosurgery;  Laterality: Right;  Right Lumbar five-Sacral One Microdiskectomy   right knee arthroscopy     x 3   TONSILLECTOMY     at age 63   Patient Active Problem List   Diagnosis Date Noted   Sleep apnea 06/12/2020   Deviated septum 03/14/2018   Chronic back pain 12/21/2017   Depression, major, recurrent, in partial remission ([place]) 08/05/2016   Degenerative joint disease 08/05/2016   Migraine 03/03/2016   Spondylolisthesis at L4-L5 level 10/27/2015   Vitamin D deficiency 04/17/2014   Medication management 04/17/2014   Hyperlipidemia, mixed    Hypertension    Asthma    GERD (gastroesophageal reflux disease)    Hypothyroidism     ONSET DATE: couple months ago  REFERRING DIAG: R42 (ICD-10-CM) - Dizziness and giddiness  THERAPY DIAG:  Unsteadiness on feet  Muscle weakness (generalized)  Rationale for Evaluation and Treatment Rehabilitation  SUBJECTIVE:   SUBJECTIVE STATEMENT: Haven't been able to do my exercises from other things we have had going on.  Had a hard time going up steps while lifting  a table over the weekend.  Still have trouble with walking and head turns.  Feeling the challenge with the exercises.  Pt accompanied by: self  PERTINENT HISTORY: asthma, depression, GERD, migraine, HLD, HTN, cervical fusion 2011, L5-S1 lami 2013, R knee scope x3   PAIN:  Are you having pain?Just sore from yardwork yesterday  PRECAUTIONS: Fall   PATIENT GOALS improve balance and LE strength  OBJECTIVE:    TODAY'S TREATMENT: 09/20/2022 Activity Comments  FGA score:  26/30 Improved from 23/30  On solid surface:   Single limb stance 15 sec RLE and LLE Tandem stance 30 sec each foot position   On compliant surface:  attempted SLS with EO/EC, increased sway and instability LOB, needing UE support to regain balance  Forward single limb step ups x 5 reps each leg leading, min UE support   In parallel bars:   monster walk forward/back x 2 reps red theraband Cues for technique and intermittent UE support for balance       M-CTSIB  Condition 1: Firm Surface, EO 30 Sec, Normal Sway  Condition 2: Firm Surface, EC 30 Sec, Mild Sway  Condition 3: Foam Surface, EO 30 Sec, Normal Sway  Condition 4: Foam Surface, EC 30 Sec, Mild Sway    Access Code: [email or web address] URL: [email or web address] Date: 09/20/2022-addition to HEP Prepared by: [facility]  Exercises - Sidelying Hip Abduction  - 1 x daily - 5 x weekly - 2 sets - 10 reps - Sidelying Hip Adduction  - 1 x daily - 5 x weekly - 2 sets - 10 reps - Romberg Stance Eyes Closed on Foam Pad  - 1 x daily - 5 x weekly - 2-3 sets - 30 sec hold - Standing Alternating Knee Flexion with Ankle Weights  - 1 x daily - 5 x weekly - 2 sets - 10 reps - [place] with Swiss Ball  - 1 x daily - 5 x weekly - 2 sets - 10 reps - Romberg Stance on Foam Pad with Head Rotation  - 1 x daily - 5 x weekly - 2 sets - 10 reps - Romberg Stance with Head Nods on Foam Pad  - 1 x daily - 5 x weekly - 2 sets - 10 reps - Goblet Squat with Kettlebell  - 1 x daily - 7 x weekly - 5 sets - 5 reps - Sumo Squat with Dumbbell  - 1 x daily - 7 x weekly - 5 sets - 5 reps - Farmer's Walk  - 1 x daily - 7 x weekly - 3 sets - 10 reps - Tandem Walking with Head Rotations  - 1 x daily - 7 x weekly - 3 sets - 10 reps - Single Leg Stance on Foam Pad  - 1 x daily - 5 x weekly - 1 sets - 3 reps - 10 sec hold        Below measures were taken at time of initial evaluation unless otherwise specified:   DIAGNOSTIC FINDINGS: none  COGNITION: Overall cognitive status: Within functional limits for tasks assessed   SENSATION: WFL  POSTURE: forward head; posterior weight shift in standing   LOWER EXTREMITY ROM:     Active  Right eval Left eval  Hip flexion    Hip extension    Hip abduction    Hip adduction    Hip internal  rotation    Hip external rotation    Knee flexion  Knee extension    Ankle dorsiflexion 13 10  Ankle plantarflexion    Ankle inversion    Ankle eversion     (Blank rows = not tested)   LOWER EXTREMITY MMT:    MMT Right eval Left eval  Hip flexion 4+ 4+  Hip extension    Hip abduction 4- 4-  Hip adduction 4 4  Hip internal rotation    Hip external rotation    Knee flexion 3+ 4+  Knee extension 4+ 4+  Ankle dorsiflexion 4+ 4+  Ankle plantarflexion 4+ (19 reps) 4+ (15 reps)  Ankle inversion    Ankle eversion     (Blank rows = not tested)    GAIT: Gait pattern: step through pattern Assistive device utilized: None Level of assistance: Complete Independence   FUNCTIONAL TESTs:   [facility] PT Assessment - 09/20/22 0001       Functional Gait  Assessment   Gait assessed  No    Gait Level Surface Walks 20 ft in less than 7 sec but greater than 5.5 sec, uses assistive device, slower speed, mild gait deviations, or deviates 6-10 in outside of the 12 in walkway width.   8 sec   Change in Gait Speed Able to smoothly change walking speed without loss of balance or gait deviation. Deviate no more than 6 in outside of the 12 in walkway width.    Gait with Horizontal Head Turns Performs head turns smoothly with no change in gait. Deviates no more than 6 in outside 12 in walkway width    Gait with Vertical Head Turns Performs head turns with no change in gait. Deviates no more than 6 in outside 12 in walkway width.    Gait and Pivot Turn Pivot turns safely within 3 sec and stops quickly with no loss of balance.    Step Over Obstacle Is able to step over 2 stacked shoe boxes taped together (9 in total height) without changing gait speed. No evidence of imbalance.    Gait with Narrow Base of Support Ambulates 4-7 steps.    Gait with Eyes Closed Walks 20 ft, uses assistive device, slower speed, mild gait deviations, deviates 6-10 in outside 12 in walkway width. Ambulates 20 ft in less than 9  sec but greater than 7 sec.    Ambulating Backwards Walks 20 ft, no assistive devices, good speed, no evidence for imbalance, normal gait    Steps Alternating feet, no rail.    Total Score 26                M-CTSIB  Condition 1: Firm Surface, EO 30 Sec, Normal Sway  Condition 2: Firm Surface, EC 30 Sec, Normal and Mild Sway  Condition 3: Foam Surface, EO 30 Sec, Moderate Sway  Condition 4: Foam Surface, EC 30 Sec, Moderate and Severe Sway     PATIENT SURVEYS:  FOTO NT d/t patient declining dizziness   HOME EXERCISE PROGRAM Access Code: [email or web address] URL: [email or web address] Date: 08/12/2022 Prepared by: [facility]  Exercises - Sidelying Hip Abduction  - 1 x daily - 5 x weekly - 2 sets - 10 reps - Sidelying Hip Adduction  - 1 x daily - 5 x weekly - 2 sets - 10 reps - Standing Hamstring Curl with Resistance  - 1 x daily - 5 x weekly - 2 sets - 10 reps - Squat with Chair Touch  - 1 x daily - 5 x weekly - 2  sets - 10 reps - Romberg Stance on Foam Pad  - 1 x daily - 5 x weekly - 2-3 sets - 30 sec hold - Romberg Stance Eyes Closed on Foam Pad  - 1 x daily - 5 x weekly - 2-3 sets - 30 sec hold    PATIENT EDUCATION: Education details: prognosis, POC, HEP Person educated: Patient Education method: Explanation, Demonstration, Tactile cues, Verbal cues, and Handouts Education comprehension: verbalized understanding and returned demonstration   GOALS: Goals reviewed with patient? Yes  SHORT TERM GOALS: Target date: 09/02/2022  Patient to be independent with initial HEP. Baseline: HEP initiated Goal status: MET  LONG TERM GOALS: Target date: 09/23/2022  Patient to be independent with advanced HEP. Baseline: Not yet initiated  Goal status: IN PROGRESS  Patient to score at least 4/5 with LE MMT.  Baseline: see above Goal status: IN PROGRESS  Patient to demonstrate mild sway with M-CTSIB condition with eyes  closed/foam surface in order to improve safety in environments with uneven surfaces and dim lighting.  Baseline: mod-severe; mild sway Goal status: GOAL MET, 09/20/2022  Patient to score at least 25/30 on FGA in order to decrease risk of falls.  Baseline: 23>26/30 09/20/2022 Goal status: GOAL MET    ASSESSMENT:  CLINICAL IMPRESSION: 10th Visit Progress Note:  Pt subjectively reports improvement in functional strengthening and some improvements in balance, though she states she feels she has a long way to go.  She reports doing her HEP and states she feels better checking in with PT, knowing that we can actively help her progress HEP and make sure she is performing correctly.  Assessed objective measures today:  MCTSIB, with pt having mild sway on Conditions 3-4, compared to moderate/severe sway at eval.  FGA score 26/30, improved from 23/30 at eval.  Pt has met LTG 3 and 4.  Today, pt has most difficulty with eyes closed and veering to R with gait, single limb stance EO and EC on foam.  She is progressing towards remaining goals, and she will continue to benefit from skilled PT towards goals for improved high level balance and functional mobility.  Will assess remaining goals next visit and determine potential for extending POC to progress HEP for improved balance, SLS, and functional strength.    OBJECTIVE IMPAIRMENTS decreased balance, decreased strength, postural dysfunction, and pain.   ACTIVITY LIMITATIONS carrying, lifting, bending, squatting, and stairs  PARTICIPATION LIMITATIONS: meal prep, cleaning, laundry, shopping, community activity, and yard work  PERSONAL FACTORS Age, Past/current experiences, Time since onset of injury/illness/exacerbation, and 3+ comorbidities: asthma, depression, GERD, migraine, HLD, HTN, cervical fusion 2011, L5-S1 lami 2013, R knee scope x3  are also affecting patient's functional outcome.   REHAB POTENTIAL: Good  CLINICAL DECISION MAKING:  Evolving/moderate complexity  EVALUATION COMPLEXITY: Moderate   PLAN: PT FREQUENCY: 1-2x/week  PT DURATION: 6 weeks  PLANNED INTERVENTIONS: Therapeutic exercises, Therapeutic activity, Neuromuscular re-education, Balance training, Gait training, Patient/Family education, Self Care, Joint mobilization, Stair training, Vestibular training, Canalith repositioning, Aquatic Therapy, Dry Needling, Cryotherapy, Moist heat, Taping, Manual therapy, and Re-evaluation  PLAN FOR NEXT SESSION: Assess remaining LTGs and discuss POC.  Review SLS on compliant surface added to HEP today; work on monster walks/resisted sidestepping with progression to green theraband for hip stability.  Will need to recert if going beyond next visit.  Mady Haagensen, PT 09/20/22 3:47 PM Phone: [phone] Fax: [phone]    [facility] [address] [place], [place], [place] [address]  Phone # [phone] Fax # [phone]

## 2022-09-23 ENCOUNTER — Ambulatory Visit: Payer: PPO | Admitting: Physical Therapy

## 2022-09-23 ENCOUNTER — Encounter: Payer: Self-pay | Admitting: Physical Therapy

## 2022-09-23 DIAGNOSIS — R2681 Unsteadiness on feet: Secondary | ICD-10-CM | POA: Diagnosis not present

## 2022-09-23 DIAGNOSIS — M6281 Muscle weakness (generalized): Secondary | ICD-10-CM

## 2022-09-23 NOTE — Therapy (Signed)
OUTPATIENT PHYSICAL THERAPY VESTIBULAR TREATMENT NOTE/RECERT     Patient Name: Sarah Rodriguez MRN: [id number] DOB:Oct 30, 1956, 66 y.o., female Today's Date: 09/23/2022  PCP: Unk Pinto, MD REFERRING PROVIDER: Raylene Miyamoto, MD      PT End of Session - 09/23/22 1406     Visit Number 11    Number of Visits 15    Date for PT Re-Evaluation 32/95/18   per recert 84/16/6063   Authorization Type [employer]    Progress Note Due on Visit 10    PT Start Time 1407    PT Stop Time 1447    PT Time Calculation (min) 40 min    Activity Tolerance Patient tolerated treatment well    Behavior During Therapy [facility] for tasks assessed/performed                  Past Medical History:  Diagnosis Date   Asthma    Dulera daily   Chronic back pain    HNP   Complication of anesthesia    slow to wake up   Depression    takes Clonazepam nightly   GERD (gastroesophageal reflux disease)    takes Protonix daily    Heart murmur    History of migraine    takes Relpax daily as needed   HSV (herpes simplex virus) infection    Hyperlipidemia    takes Atorvastatin daily   Hypertension    takes Benicar daily   Hypothyroidism    takes Synthroid daily   MVP (mitral valve prolapse)    Pneumonia    hx of--been many yrs ago   Seasonal allergies    takes Allegra daily   Shortness of breath    with exertion   Past Surgical History:  Procedure Laterality Date   ABLATION  2011   Uterine   buttocks surgery     at age 27 (coccyx repair)   CERVICAL FUSION  03/2010   CESAREAN SECTION  1992   CHOLECYSTECTOMY  2008   COLONOSCOPY     DILATION AND CURETTAGE OF UTERUS  2011   ESOPHAGOGASTRODUODENOSCOPY     LUMBAR LAMINECTOMY/DECOMPRESSION MICRODISCECTOMY  10/24/2012   Procedure: LUMBAR LAMINECTOMY/DECOMPRESSION MICRODISCECTOMY 1 LEVEL;  Surgeon: Kristeen Miss, MD;  Location: [facility];  Service: Neurosurgery;  Laterality: Right;  Right Lumbar five-Sacral One Microdiskectomy   right  knee arthroscopy     x 3   TONSILLECTOMY     at age 59   Patient Active Problem List   Diagnosis Date Noted   Sleep apnea 06/12/2020   Deviated septum 03/14/2018   Chronic back pain 12/21/2017   Depression, major, recurrent, in partial remission ([place]) 08/05/2016   Degenerative joint disease 08/05/2016   Migraine 03/03/2016   Spondylolisthesis at L4-L5 level 10/27/2015   Vitamin D deficiency 04/17/2014   Medication management 04/17/2014   Hyperlipidemia, mixed    Hypertension    Asthma    GERD (gastroesophageal reflux disease)    Hypothyroidism     ONSET DATE: couple months ago  REFERRING DIAG: R42 (ICD-10-CM) - Dizziness and giddiness  THERAPY DIAG:  Unsteadiness on feet  Muscle weakness (generalized)  Rationale for Evaluation and Treatment Rehabilitation  SUBJECTIVE:   SUBJECTIVE STATEMENT: Still been working on exercises.  Still want to make sure I'm doing them correctly.  Pt accompanied by: self  PERTINENT HISTORY: asthma, depression, GERD, migraine, HLD, HTN, cervical fusion 2011, L5-S1 lami 2013, R knee scope x3   PAIN:  Are you having pain? No pain  PRECAUTIONS: Fall   PATIENT GOALS improve balance and LE strength  OBJECTIVE:    TODAY'S TREATMENT: 09/23/2022 Activity Comments  Assessed MMT for BLE strength-see chart below Improvements noted in hip abduction, hamstrings; however, glut strength/hip extension indicates weakness  Performed exercises in bold below from HEP, with pt return demo understanding does need cues for neutral spine posture to avoid excess trunk extension  Performed standing hip extension, 4# weights x 10 reps; attempted with green band, pt uncomfortable with posture and pain in back Cues for technique  Seated core stability exercises on green therapy ball-abdominal activation with UE activities,cues for trunk neutral                Access Code: [email or web address] URL: [email or web address] Date: 09/20/2022-addition to  HEP Prepared by: [facility]  Exercises - Sidelying Hip Abduction  - 1 x daily - 5 x weekly - 2 sets - 10 reps - Sidelying Hip Adduction  - 1 x daily - 5 x weekly - 2 sets - 10 reps - Romberg Stance Eyes Closed on Foam Pad  - 1 x daily - 5 x weekly - 2-3 sets - 30 sec hold - Standing Alternating Knee Flexion with Ankle Weights  - 1 x daily - 5 x weekly - 2 sets - 10 reps - [place] with Swiss Ball  - 1 x daily - 5 x weekly - 2 sets - 10 reps - Romberg Stance on Foam Pad with Head Rotation  - 1 x daily - 5 x weekly - 2 sets - 10 reps - Romberg Stance with Head Nods on Foam Pad  - 1 x daily - 5 x weekly - 2 sets - 10 reps - Goblet Squat with Kettlebell  - 1 x daily - 7 x weekly - 5 sets - 5 reps - Sumo Squat with Dumbbell  - 1 x daily - 7 x weekly - 5 sets - 5 reps - Farmer's Walk  - 1 x daily - 7 x weekly - 3 sets - 10 reps - Tandem Walking with Head Rotations  - 1 x daily - 7 x weekly - 3 sets - 10 reps - Single Leg Stance on Foam Pad  - 1 x daily - 5 x weekly - 1 sets - 3 reps - 10 sec hold        Below measures were taken at time of initial evaluation unless otherwise specified:   DIAGNOSTIC FINDINGS: none  COGNITION: Overall cognitive status: Within functional limits for tasks assessed   SENSATION: WFL  POSTURE: forward head; posterior weight shift in standing   LOWER EXTREMITY ROM:     Active  Right eval Left eval  Hip flexion    Hip extension    Hip abduction    Hip adduction    Hip internal rotation    Hip external rotation    Knee flexion    Knee extension    Ankle dorsiflexion 13 10  Ankle plantarflexion    Ankle inversion    Ankle eversion     (Blank rows = not tested)   LOWER EXTREMITY MMT:    MMT Right eval Left eval Right 09/23/22 Left  09/23/22  Hip flexion 4+ 4+ 5 5  Hip extension   3+ 3+  Hip abduction 4- 4- 4 4-  Hip adduction _0 Hip internal rotation      Hip external rotation  Knee flexion 3+ 4+ 4 4+  Knee extension 4+ 4+ 5 5  Ankle dorsiflexion 4+ 4+    Ankle plantarflexion 4+ (19 reps) 4+ (15 reps)    Ankle inversion      Ankle eversion       (Blank rows = not tested)    GAIT: Gait pattern: step through pattern Assistive device utilized: None Level of assistance: Complete Independence   FUNCTIONAL TESTs:        M-CTSIB  Condition 1: Firm Surface, EO 30 Sec, Normal Sway  Condition 2: Firm Surface, EC 30 Sec, Normal and Mild Sway  Condition 3: Foam Surface, EO 30 Sec, Moderate Sway  Condition 4: Foam Surface, EC 30 Sec, Moderate and Severe Sway     PATIENT SURVEYS:  FOTO NT d/t patient declining dizziness   HOME EXERCISE PROGRAM Access Code: [email or web address] URL: [email or web address] Date: 08/12/2022 Prepared by: [facility]  Exercises - Sidelying Hip Abduction  - 1 x daily - 5 x weekly - 2 sets - 10 reps - Sidelying Hip Adduction  - 1 x daily - 5 x weekly - 2 sets - 10 reps - Standing Hamstring Curl with Resistance  - 1 x daily - 5 x weekly - 2 sets - 10 reps - Squat with Chair Touch  - 1 x daily - 5 x weekly - 2 sets - 10 reps - Romberg Stance on Foam Pad  - 1 x daily - 5 x weekly - 2-3 sets - 30 sec hold - Romberg Stance Eyes Closed on Foam Pad  - 1 x daily - 5 x weekly - 2-3 sets - 30 sec hold    PATIENT EDUCATION: Education details: prognosis, POC, HEP Person educated: Patient Education method: Explanation, Demonstration, Tactile cues, Verbal cues, and Handouts Education comprehension: verbalized understanding and returned demonstration   GOALS: Goals reviewed with patient? Yes  SHORT TERM GOALS: Target date: 09/02/2022  Patient to be independent with initial HEP. Baseline: HEP initiated Goal status: MET  LONG TERM GOALS: Target date: 09/23/2022>UPDATED FOR LTGS 1 and 2, target date 10/22/2022  1-Patient to be independent with advanced HEP. Baseline: Not yet initiated   Goal status: GOAL ONGOING, 09/23/2022  2-Patient to score at least 4/5 with LE MMT.  Baseline: see above (improvements, but glut strength 3+/5) Goal status: GOAL PARTIALLY MET/ONGOING 09/23/2022  Patient to demonstrate mild sway with M-CTSIB condition with eyes closed/foam surface in order to improve safety in environments with uneven surfaces and dim lighting.  Baseline: mod-severe; mild sway Goal status: GOAL MET, 09/20/2022  Patient to score at least 25/30 on FGA in order to decrease risk of falls.  Baseline: 23>26/30 09/20/2022 Goal status: GOAL MET    ASSESSMENT:  CLINICAL IMPRESSION: Assessed remaining LTGs this visit, with LTG 1 ongoing (pt requires cues for technique to avoid excess lumbar extension; unable to assess balance exercises due to time constraints).  LTG 2 partially met, with improvements noted in BLE strength (see chart); assessed hip extension strength today and she demonstrates weakness with 3+/5 in B hip extensors.  She will continue to benefit from skilled PT, reducing frequency to 1x/wk to revise/update/advance HEP to further address strength and balance.    At previous visit:  Assessed objective measures, including MCTSIB, with pt having mild sway on Conditions 3-4, compared to moderate/severe sway at eval.  FGA score 26/30, improved from 23/30 at eval.  Pt has met LTG 3 and 4.  She is progressing towards  remaining goals, and she will continue to benefit from skilled PT towards goals for improved high level balance and functional mobility.     OBJECTIVE IMPAIRMENTS decreased balance, decreased strength, postural dysfunction, and pain.   ACTIVITY LIMITATIONS carrying, lifting, bending, squatting, and stairs  PARTICIPATION LIMITATIONS: meal prep, cleaning, laundry, shopping, community activity, and yard work  PERSONAL FACTORS Age, Past/current experiences, Time since onset of injury/illness/exacerbation, and 3+ comorbidities: asthma, depression, GERD, migraine,  HLD, HTN, cervical fusion 2011, L5-S1 lami 2013, R knee scope x3  are also affecting patient's functional outcome.   REHAB POTENTIAL: Good  CLINICAL DECISION MAKING: Evolving/moderate complexity  EVALUATION COMPLEXITY: Moderate   PLAN: PT FREQUENCY: 1x/week  PT DURATION: 4 weeks(per recert 03/00/9233)  PLANNED INTERVENTIONS: Therapeutic exercises, Therapeutic activity, Neuromuscular re-education, Balance training, Gait training, Patient/Family education, Self Care, Joint mobilization, Stair training, Vestibular training, Canalith repositioning, Aquatic Therapy, Dry Needling, Cryotherapy, Moist heat, Taping, Manual therapy, and Re-evaluation  PLAN FOR NEXT SESSION: Review SLS on compliant surface and other balance exercises.  Work on monster walks/resisted sidestepping with progression to green theraband for hip stability.    Mady Haagensen, PT 09/23/22 5:28 PM Phone: [phone] Fax: [phone]    [facility] at [facility] [place], [place], [place] [address] Phone # [phone] Fax # [phone]

## 2022-09-28 ENCOUNTER — Ambulatory Visit: Payer: PPO | Admitting: Physical Therapy

## 2022-09-28 ENCOUNTER — Other Ambulatory Visit: Payer: Self-pay | Admitting: Internal Medicine

## 2022-09-28 DIAGNOSIS — R2681 Unsteadiness on feet: Secondary | ICD-10-CM

## 2022-09-28 DIAGNOSIS — M6281 Muscle weakness (generalized): Secondary | ICD-10-CM

## 2022-09-28 NOTE — Therapy (Signed)
OUTPATIENT PHYSICAL THERAPY VESTIBULAR TREATMENT NOTE/RECERT     Patient Name: Sarah Rodriguez MRN: [id number] DOB:1955/12/09, 66 y.o., female Today's Date: 09/28/2022  PCP: Unk Pinto, MD REFERRING PROVIDER: Raylene Miyamoto, MD      PT End of Session - 09/28/22 1235     Visit Number 12    Number of Visits 15    Date for PT Re-Evaluation 19/41/74   per recert 07/12/4817   Authorization Type [employer]    PT Start Time 1235    Activity Tolerance Patient tolerated treatment well    Behavior During Therapy [facility] for tasks assessed/performed                   Past Medical History:  Diagnosis Date   Asthma    Dulera daily   Chronic back pain    HNP   Complication of anesthesia    slow to wake up   Depression    takes Clonazepam nightly   GERD (gastroesophageal reflux disease)    takes Protonix daily    Heart murmur    History of migraine    takes Relpax daily as needed   HSV (herpes simplex virus) infection    Hyperlipidemia    takes Atorvastatin daily   Hypertension    takes Benicar daily   Hypothyroidism    takes Synthroid daily   MVP (mitral valve prolapse)    Pneumonia    hx of--been many yrs ago   Seasonal allergies    takes Allegra daily   Shortness of breath    with exertion   Past Surgical History:  Procedure Laterality Date   ABLATION  2011   Uterine   buttocks surgery     at age 72 (coccyx repair)   CERVICAL FUSION  03/2010   CESAREAN SECTION  1992   CHOLECYSTECTOMY  2008   COLONOSCOPY     DILATION AND CURETTAGE OF UTERUS  2011   ESOPHAGOGASTRODUODENOSCOPY     LUMBAR LAMINECTOMY/DECOMPRESSION MICRODISCECTOMY  10/24/2012   Procedure: LUMBAR LAMINECTOMY/DECOMPRESSION MICRODISCECTOMY 1 LEVEL;  Surgeon: Kristeen Miss, MD;  Location: [facility];  Service: Neurosurgery;  Laterality: Right;  Right Lumbar five-Sacral One Microdiskectomy   right knee arthroscopy     x 3   TONSILLECTOMY     at age 33   Patient Active Problem List    Diagnosis Date Noted   Sleep apnea 06/12/2020   Deviated septum 03/14/2018   Chronic back pain 12/21/2017   Depression, major, recurrent, in partial remission ([place]) 08/05/2016   Degenerative joint disease 08/05/2016   Migraine 03/03/2016   Spondylolisthesis at L4-L5 level 10/27/2015   Vitamin D deficiency 04/17/2014   Medication management 04/17/2014   Hyperlipidemia, mixed    Hypertension    Asthma    GERD (gastroesophageal reflux disease)    Hypothyroidism     ONSET DATE: couple months ago  REFERRING DIAG: R42 (ICD-10-CM) - Dizziness and giddiness  THERAPY DIAG:  Unsteadiness on feet  Muscle weakness (generalized)  Rationale for Evaluation and Treatment Rehabilitation  SUBJECTIVE:   SUBJECTIVE STATEMENT: Been doing the exercises. Pt accompanied by: self  PERTINENT HISTORY: asthma, depression, GERD, migraine, HLD, HTN, cervical fusion 2011, L5-S1 lami 2013, R knee scope x3   PAIN:  Are you having pain? No pain  PRECAUTIONS: Fall   PATIENT GOALS improve balance and LE strength  OBJECTIVE:    TODAY'S TREATMENT: 09/28/2022 Activity Comments  Pt questions technique for sidelying hip abduction/adduction; at home she uses 4# weights  Demo good form with hip abduction; with adduction with added weight, pt tends to initiate with hip flexion; cues for alignment  Answered questions about form for Sumo squats and goblet squat Provided cues for technique; overall, pt appears to be performing well  Seated core stability exercises on green therapy ball-abdominal activation with UE activities,then marching, then LAQ cues for trunk neutral   Standing on Airex:  alt step taps x 10 reps, no UE support; forward step taps>back into runner's stretch position, x 10 reps Intermittent UE support; occasional LOB and reqches for UE support; cues for wider BOS on Airex  On Airex:  alt step taps to cones, 1 UE support; SLS 10 sec, 2 reps each leg (cues for posture to avoid posterior trunk  lean) Cues for wider BOS  Resisted monster walk with green theraband, 4 reps in parallel bars-forward and back; resisted sidestepping R and L in parallel bars, then with no UE support 2 reps R and L, with green band   [place] 4 x 15 ft no UE support, no LOB       Access Code: [email or web address] URL: [email or web address] Date: 09/28/2022-most recent update Prepared by: [facility]  Exercises - Sidelying Hip Abduction  - 1 x daily - 5 x weekly - 2 sets - 10 reps - Sidelying Hip Adduction  - 1 x daily - 5 x weekly - 2 sets - 10 reps - Romberg Stance Eyes Closed on Foam Pad  - 1 x daily - 5 x weekly - 2-3 sets - 30 sec hold - Standing Alternating Knee Flexion with Ankle Weights  - 1 x daily - 5 x weekly - 2 sets - 10 reps - [place] with Swiss Ball  - 1 x daily - 5 x weekly - 2 sets - 10 reps - Romberg Stance on Foam Pad with Head Rotation  - 1 x daily - 5 x weekly - 2 sets - 10 reps - Romberg Stance with Head Nods on Foam Pad  - 1 x daily - 5 x weekly - 2 sets - 10 reps - Goblet Squat with Kettlebell  - 1 x daily - 7 x weekly - 5 sets - 5 reps - Sumo Squat with Dumbbell  - 1 x daily - 7 x weekly - 5 sets - 5 reps - Farmer's Walk  - 1 x daily - 7 x weekly - 3 sets - 10 reps - Tandem Walking with Head Rotations  - 1 x daily - 7 x weekly - 3 sets - 10 reps - Single Leg Stance on Foam Pad  - 1 x daily - 5 x weekly - 1 sets - 3 reps - 10 sec hold - [place] with Counter Support  - 1 x daily - 5 x weekly - 1 sets - 3 reps  PATIENT EDUCATION: Education details: HEP addition  Person educated: Patient Education method: Explanation, Demonstration, and Handouts Education comprehension: verbalized understanding and returned demonstration    Below measures were taken at time of initial evaluation unless otherwise specified:   DIAGNOSTIC FINDINGS: none  COGNITION: Overall cognitive status: Within functional limits for tasks  assessed   SENSATION: WFL  POSTURE: forward head; posterior weight shift in standing   LOWER EXTREMITY ROM:     Active  Right eval Left eval  Hip flexion    Hip extension    Hip abduction    Hip adduction    Hip internal rotation  Hip external rotation    Knee flexion    Knee extension    Ankle dorsiflexion 13 10  Ankle plantarflexion    Ankle inversion    Ankle eversion     (Blank rows = not tested)   LOWER EXTREMITY MMT:    MMT Right eval Left eval Right 09/23/22 Left  09/23/22  Hip flexion 4+ 4+ 5 5  Hip extension   3+ 3+  Hip abduction 4- 4- 4 4-  Hip adduction _0 Hip internal rotation      Hip external rotation      Knee flexion 3+ 4+ 4 4+  Knee extension 4+ 4+ 5 5  Ankle dorsiflexion 4+ 4+    Ankle plantarflexion 4+ (19 reps) 4+ (15 reps)    Ankle inversion      Ankle eversion       (Blank rows = not tested)    GAIT: Gait pattern: step through pattern Assistive device utilized: None Level of assistance: Complete Independence   FUNCTIONAL TESTs:        M-CTSIB  Condition 1: Firm Surface, EO 30 Sec, Normal Sway  Condition 2: Firm Surface, EC 30 Sec, Normal and Mild Sway  Condition 3: Foam Surface, EO 30 Sec, Moderate Sway  Condition 4: Foam Surface, EC 30 Sec, Moderate and Severe Sway     PATIENT SURVEYS:  FOTO NT d/t patient declining dizziness   HOME EXERCISE PROGRAM Access Code: [email or web address] URL: [email or web address] Date: 08/12/2022 Prepared by: [facility]  Exercises - Sidelying Hip Abduction  - 1 x daily - 5 x weekly - 2 sets - 10 reps - Sidelying Hip Adduction  - 1 x daily - 5 x weekly - 2 sets - 10 reps - Standing Hamstring Curl with Resistance  - 1 x daily - 5 x weekly - 2 sets - 10 reps - Squat with Chair Touch  - 1 x daily - 5 x weekly - 2 sets - 10 reps - Romberg Stance on Foam Pad  - 1 x daily - 5 x weekly - 2-3 sets - 30 sec hold - Romberg Stance Eyes Closed  on Foam Pad  - 1 x daily - 5 x weekly - 2-3 sets - 30 sec hold    PATIENT EDUCATION: Education details: prognosis, POC, HEP Person educated: Patient Education method: Explanation, Demonstration, Tactile cues, Verbal cues, and Handouts Education comprehension: verbalized understanding and returned demonstration   GOALS: Goals reviewed with patient? Yes  SHORT TERM GOALS: Target date: 09/02/2022  Patient to be independent with initial HEP. Baseline: HEP initiated Goal status: MET  LONG TERM GOALS: Target date: 09/23/2022>UPDATED FOR LTGS 1 and 2, target date 10/22/2022  1-Patient to be independent with advanced HEP. Baseline: Not yet initiated  Goal status: GOAL ONGOING, 09/23/2022  2-Patient to score at least 4/5 with LE MMT.  Baseline: see above (improvements, but glut strength 3+/5) Goal status: GOAL PARTIALLY MET/ONGOING 09/23/2022  Patient to demonstrate mild sway with M-CTSIB condition with eyes closed/foam surface in order to improve safety in environments with uneven surfaces and dim lighting.  Baseline: mod-severe; mild sway Goal status: GOAL MET, 09/20/2022  Patient to score at least 25/30 on FGA in order to decrease risk of falls.  Baseline: 23>26/30 09/20/2022 Goal status: GOAL MET    ASSESSMENT:  CLINICAL IMPRESSION: Continued to work on hip stability, core stability, single limb stance and compliant surfaces this visit.  Pt has increased difficulty  with dynamic SLS activities on Airex, have several LOB and needing UE support to regain balance.  Worked on resisted hip strengthening and braiding activities for balance recovery work.  She will continue to benefit from skilled PT towards LTGs for improved balance, stability, functional mobility.     OBJECTIVE IMPAIRMENTS decreased balance, decreased strength, postural dysfunction, and pain.   ACTIVITY LIMITATIONS carrying, lifting, bending, squatting, and stairs  PARTICIPATION LIMITATIONS: meal prep, cleaning,  laundry, shopping, community activity, and yard work  PERSONAL FACTORS Age, Past/current experiences, Time since onset of injury/illness/exacerbation, and 3+ comorbidities: asthma, depression, GERD, migraine, HLD, HTN, cervical fusion 2011, L5-S1 lami 2013, R knee scope x3  are also affecting patient's functional outcome.   REHAB POTENTIAL: Good  CLINICAL DECISION MAKING: Evolving/moderate complexity  EVALUATION COMPLEXITY: Moderate   PLAN: PT FREQUENCY: 1x/week  PT DURATION: 4 weeks(per recert 56/43/3295)  PLANNED INTERVENTIONS: Therapeutic exercises, Therapeutic activity, Neuromuscular re-education, Balance training, Gait training, Patient/Family education, Self Care, Joint mobilization, Stair training, Vestibular training, Canalith repositioning, Aquatic Therapy, Dry Needling, Cryotherapy, Moist heat, Taping, Manual therapy, and Re-evaluation  PLAN FOR NEXT SESSION: Continue to work on SLS on compliant surface; review addition to HEP this visit.  Work on monster walks/resisted sidestepping with progression to green theraband for hip stability.    Mady Haagensen, PT 09/28/22 1:39 PM Phone: [phone] Fax: [phone]    [facility] [place], [place], [place] [address] Phone # [phone] Fax # [phone]

## 2022-09-30 ENCOUNTER — Ambulatory Visit (INDEPENDENT_AMBULATORY_CARE_PROVIDER_SITE_OTHER): Payer: PPO | Admitting: Internal Medicine

## 2022-09-30 ENCOUNTER — Encounter: Payer: Self-pay | Admitting: Internal Medicine

## 2022-09-30 VITALS — BP 138/78 | HR 80 | Temp 97.9°F | Resp 17 | Ht 61.5 in | Wt 106.8 lb

## 2022-09-30 DIAGNOSIS — Z0001 Encounter for general adult medical examination with abnormal findings: Secondary | ICD-10-CM

## 2022-09-30 DIAGNOSIS — G8929 Other chronic pain: Secondary | ICD-10-CM

## 2022-09-30 DIAGNOSIS — Z1211 Encounter for screening for malignant neoplasm of colon: Secondary | ICD-10-CM

## 2022-09-30 DIAGNOSIS — Z8249 Family history of ischemic heart disease and other diseases of the circulatory system: Secondary | ICD-10-CM | POA: Diagnosis not present

## 2022-09-30 DIAGNOSIS — Z87891 Personal history of nicotine dependence: Secondary | ICD-10-CM

## 2022-09-30 DIAGNOSIS — Z0000 Encounter for general adult medical examination without abnormal findings: Secondary | ICD-10-CM | POA: Diagnosis not present

## 2022-09-30 DIAGNOSIS — E782 Mixed hyperlipidemia: Secondary | ICD-10-CM

## 2022-09-30 DIAGNOSIS — R7309 Other abnormal glucose: Secondary | ICD-10-CM | POA: Diagnosis not present

## 2022-09-30 DIAGNOSIS — I10 Essential (primary) hypertension: Secondary | ICD-10-CM | POA: Diagnosis not present

## 2022-09-30 DIAGNOSIS — E039 Hypothyroidism, unspecified: Secondary | ICD-10-CM

## 2022-09-30 DIAGNOSIS — Z136 Encounter for screening for cardiovascular disorders: Secondary | ICD-10-CM

## 2022-09-30 DIAGNOSIS — E559 Vitamin D deficiency, unspecified: Secondary | ICD-10-CM

## 2022-09-30 DIAGNOSIS — F3341 Major depressive disorder, recurrent, in partial remission: Secondary | ICD-10-CM

## 2022-09-30 DIAGNOSIS — Z79899 Other long term (current) drug therapy: Secondary | ICD-10-CM

## 2022-09-30 NOTE — Patient Instructions (Signed)

## 2022-09-30 NOTE — Progress Notes (Signed)
Annual Screening/Preventative Visit & Comprehensive Evaluation &  Examination   Future Appointments  Date Time Provider Department  09/30/2022                    cpe  3:00 PM Unk Pinto, MD [facility]  10/04/2022  2:00 PM Shade Flood [facility]  11/11/2022  2:00 PM Arfeen, Arlyce Harman, MD [facility]  01/06/2023                      ov & wellness 11:00 AM Darrol Jump, NP [facility]  10/06/2023                    cpe  3:00 PM Unk Pinto, MD [facility]        This very nice 66 y.o. MWF presents for a Screening /Preventative Visit & comprehensive evaluation and management of multiple medical co-morbidities.  Patient has been followed for HTN, HLD, GERD, Migraines, Hypothyroidism, Prediabetes  and Vitamin D Deficiency. Patient also has hx/COPD which is controlled on maintenance Advair with infrequent use of rescue inhalers.    [[COPIED: Patient  is on SS Disability (2016) for a Chronic Pain Syndrome attributed to Cx DDD / Cx HNP  (1st Cx surgery in 2011 and a 2sd surgery  L5/S1 in 2013 at  by Dr Ellene Route and 3rd surgery- Lumbar Fusion in Nov 2016).  She reports chronic intermittent RLE pain. ]]         Patient is followed by Dr Berniece Andreas  & Shade Flood , LCSW, LCAS  for  a major Depressive Disorder  stable on Lamictal, Duloxetine, Clonazepam & Amitriptyline.         HTN predates since  2007.  Patient's BP has been controlled  on  Losartan at home and patient denies any cardiac symptoms as chest pain, palpitations, shortness of breath, dizziness or ankle swelling. Today's BP is at goal  -  138/78 .        Patient's hyperlipidemia is not  controlled with diet and Rosuvastatin. Patient denies myalgias or other medication SE's. Last lipids were at goal except elevated Trig's :  Lab Results  Component Value Date   CHOL 134 09/30/2022   HDL 41 (L) 09/30/2022   LDLCALC 70 09/30/2022   TRIG 150 (H) 09/30/2022   CHOLHDL 3.3 09/30/2022         Patient has hx/o prediabetes (A1c 5.9%  /2009 & 6.3% /2014) and patient denies reactive hypoglycemic symptoms, visual blurring, diabetic polys or paresthesias. Last A1c was near  goal :  Lab Results  Component Value Date   HGBA1C 5.7 (H) 09/30/2022                                             Patient was dx'd Hypothyroid in 2009  and begun on thyroid replacement.       Finally, patient has history of Vitamin D Deficiency ("19" /2009) and last Vitamin D was at goal :  Lab Results  Component Value Date   VD25OH 89 09/08/2022        Current Outpatient Medications on File Prior to Visit  Medication Sig   albuterol (PROAIR HFA) 108 (90 Base) MCG/ACT inhaler Inhale 2 puffs into the lungs every 6 (six) hours as needed for wheezing or shortness of breath.   amitriptyline (ELAVIL) 25 MG tablet  Take 1 tablet (25 mg total) by mouth at bedtime.   cetirizine (ZYRTEC) 10 MG tablet TAKE 1 TABLET(10 MG) BY MOUTH DAILY   Cholecalciferol (VITAMIN D PO) Take 4,000 Units by mouth daily.    CINNAMON PO Take 1,000 Units by mouth 2 (two) times daily.    clonazePAM (KLONOPIN) 0.5 MG tablet Take 1/2 to 1 tablet as needed for severe anxiety.   diclofenac sodium (VOLTAREN) 1 % GEL Apply 4 g topically 4 (four) times daily.   DULoxetine HCl 40 MG CPEP Take 40 mg by mouth daily.   fluticasone (FLONASE) 50 MCG/ACT nasal spray INSTILL 2 SPRAYS IN EACH NOSTRIL EVERY EVENING   fluticasone-salmeterol (ADVAIR HFA) 115-21 MCG/ACT inhaler INHALE 2 PUFFS BY MOUTH EVERY 12 HOURS 10 TO 15 MINUTES APART. Rinse mouth and gargle after using to avoid thrush.   halobetasol (ULTRAVATE) 0.05 % ointment    lamoTRIgine (LAMICTAL) 25 MG tablet Take 1 tablet (25 mg total) by mouth 2 (two) times daily.   levothyroxine (SYNTHROID) 50 MCG tablet Take 1/2 tablet  Daily  on an empty stomach with only water for 30 minutes & no Antacid meds, Calcium or Magnesium for 4 hours & avoid Biotin   losartan (COZAAR) 50 MG tablet TAKE 1 TABLET BY MOUTH DAILY FOR BLOOD PRESSURE   Magnesium  250 MG TABS Take 3 tablets by mouth daily.    meloxicam (MOBIC) 15 MG tablet Take  1 tablet   Daily  with Food for Pain & Inflammation   montelukast (SINGULAIR) 10 MG tablet Take  1 tablet  Daily  for Allergies   nystatin (MYCOSTATIN) 100000 UNIT/ML suspension SWISH AND SWALLOW 5 ML BY MOUTH FOUR TIMES DAILY FOR THRUSH OR YEAST INFECTION   ondansetron (ZOFRAN) 4 MG tablet Take 1 tablet (4 mg total) by mouth every 8 (eight) hours as needed.   pantoprazole (PROTONIX) 40 MG tablet Take  1 tablet  Daily  to Prevent Heartburn & Indigestion         /TAKE 1 TABLET BY MOUTH EVERY DAY   pregabalin (LYRICA) 50 MG capsule Take 50 mg by mouth at bedtime.   PREMARIN vaginal cream INSERT 1/2 GRAM VAGINALLY VIA APPLICATOR NIGHTLY FOR W EEKS THEN JUST DO TEICE A WEEK   Pseudoephedrine HCl 240 MG TB24 Take 1 tablet every Morning for Head & Ear congestion   Rimegepant Sulfate (NURTEC) 75 MG TBDP Take 75 mg by mouth as needed (migraine).   rosuvastatin (CRESTOR) 40 MG tablet Take  1 tablet  2 x /week for Cholesterol   Spacer/Aero-Holding Chambers (AEROCHAMBER MV) inhaler Use as instructed with inhaler.    Allergies  Allergen Reactions   Demerol [Meperidine] Other (See Comments)    REACTION: unknown--itching   Epinephrine Other (See Comments)    "Sensitivity."   Niacin And Related Rash     Past Medical History:  Diagnosis Date   Asthma    Dulera daily   Chronic back pain    HNP   Complication of anesthesia    slow to wake up   Depression    takes Clonazepam nightly   GERD (gastroesophageal reflux disease)    takes Protonix daily    Heart murmur    History of migraine    takes Relpax daily as needed   HSV (herpes simplex virus) infection    Hyperlipidemia    takes Atorvastatin daily   Hypertension    takes Benicar daily   Hypothyroidism    takes Synthroid daily   MVP (mitral  valve prolapse)    Pneumonia    hx of--been many yrs ago   Seasonal allergies    takes Allegra daily   Shortness  of breath    with exertion     Health Maintenance  Topic Date Due   Pneumococcal Vaccine 33-15 Years old (1 - PCV) [birth year]04/1963   Zoster Vaccines- Shingrix (1 of 2) Never done   COVID-19 Vaccine (4 - Booster for Moderna series) 01/05/2021   INFLUENZA VACCINE  06/29/2021   MAMMOGRAM  03/02/2022   TETANUS/TDAP  05/05/2026   COLONOSCOPY (Pts 45-72yr Insurance coverage will need to be confirmed)  02/26/2031   Hepatitis C Screening  Completed   HIV Screening  Completed   HPV VACCINES  Aged Out     Immunization History  Administered Date(s) Administered   Influenza Inj Mdck Quad  08/27/2019   Influenza Inj Mdck Quad  09/14/2017, 10/19/2018, 09/24/2020   Influenza,inj,quad 11/09/2016   Influenza 09/18/2013   Moderna Sars-Covid-2 Vacc 02/15/2020, 03/18/2020, 11/10/2020   PPD Test 04/17/2014   Pneumococcal-23 11/30/1995   Td 08/27/2005, 05/05/2016     Last Colon - 02/25/2021 -  Dr BCristina Gong- Recc 10 year f/u - due Apr 2032           EGD - 02/25/2021 - Dr BCristina Gong- WNL  Last MGM - 03/11/2022   Past Surgical History:  Procedure Laterality Date   Uterine  ABLATION  2011   buttocks surgery     at age 89(coccyx repair)   CERVICAL FUSION  03/2010   CESAREAN SECTION  1992   CHOLECYSTECTOMY  2008   COLONOSCOPY     DILATION AND CURETTAGE OF UTERUS  2011   ESOPHAGOGASTRODUODENOSCOPY     LUMBAR LAMINECTOMY/DECOMPRESSION MICRODISCECTOMY  10/24/2012   LUMBAR LAMINECTOMY/DECOMPRESSION MICRODISCECTOMY ; HKristeen Miss MD;  Right Lumbar five-Sacral One Microdiskectomy   right knee arthroscopy     x 3   TONSILLECTOMY     at age 89    Family History  Problem Relation Age of Onset   Arthritis Mother    Heart attack Mother    Depression Mother    Allergies Mother    Hypertension Mother    Heart attack Father      Social History   Tobacco Use   Smoking status: Former   Smokeless tobacco: Never   Tobacco comments:    quit at age 66 Vaping Use   Vaping Use: Never used   Substance Use Topics   Alcohol use: No    Comment: 2-3 times a yr   Drug use: No      ROS Constitutional: Denies fever, chills, weight loss/gain, headaches, insomnia,  night sweats, and change in appetite. Does c/o fatigue. Eyes: Denies redness, blurred vision, diplopia, discharge, itchy, watery eyes.  ENT: Denies discharge, congestion, post nasal drip, epistaxis, sore throat, earache, hearing loss, dental pain, Tinnitus, Vertigo, Sinus pain, snoring.  Cardio: Denies chest pain, palpitations, irregular heartbeat, syncope, dyspnea, diaphoresis, orthopnea, PND, claudication, edema Respiratory: denies cough, dyspnea, DOE, pleurisy, hoarseness, laryngitis, wheezing.  Gastrointestinal: Denies dysphagia, heartburn, reflux, water brash, pain, cramps, nausea, vomiting, bloating, diarrhea, constipation, hematemesis, melena, hematochezia, jaundice, hemorrhoids Genitourinary: Denies dysuria, frequency, urgency, nocturia, hesitancy, discharge, hematuria, flank pain Breast: Breast lumps, nipple discharge, bleeding.  Musculoskeletal: Denies arthralgia, myalgia, stiffness, Jt. Swelling, pain, limp, and strain/sprain. Denies falls. Skin: Denies puritis, rash, hives, warts, acne, eczema, changing in skin lesion Neuro: No weakness, tremor, incoordination, spasms, paresthesia, pain Psychiatric: Denies confusion, memory loss, sensory  loss. Denies Depression. Endocrine: Denies change in weight, skin, hair change, nocturia, and paresthesia, diabetic polys, visual blurring, hyper / hypo glycemic episodes.  Heme/Lymph: No excessive bleeding, bruising, enlarged lymph nodes.  Physical Exam  There were no vitals taken for this visit.  General Appearance: Well nourished, well groomed and in no apparent distress.  Eyes: PERRLA, EOMs, conjunctiva no swelling or erythema, normal fundi and vessels. Sinuses: No frontal/maxillary tenderness ENT/Mouth: EACs patent / TMs  nl. Nares clear without erythema, swelling,  mucoid exudates. Oral hygiene is good. No erythema, swelling, or exudate. Tongue normal, non-obstructing. Tonsils not swollen or erythematous. Hearing normal.  Neck: Supple, thyroid not palpable. No bruits, nodes or JVD. Respiratory: Respiratory effort normal.  BS equal and clear bilateral without rales, rhonci, wheezing or stridor. Cardio: Heart sounds are normal with regular rate and rhythm and no murmurs, rubs or gallops. Peripheral pulses are normal and equal bilaterally without edema. No aortic or femoral bruits. Chest: symmetric with normal excursions and percussion. Breasts: Symmetric, without lumps, nipple discharge, retractions, or fibrocystic changes.  Abdomen: Flat, soft with bowel sounds active. Nontender, no guarding, rebound, hernias, masses, or organomegaly.  Lymphatics: Non tender without lymphadenopathy.  Musculoskeletal: Full ROM all peripheral extremities, joint stability, 5/5 strength, and normal gait. Skin: Warm and dry without rashes, lesions, cyanosis, clubbing or  ecchymosis.  Neuro: Cranial nerves intact, reflexes equal bilaterally. Normal muscle tone, no cerebellar symptoms. Sensation intact.  Pysch: Alert and oriented X 3, normal affect, Insight and Judgment appropriate.    Assessment and Plan  1. Annual Preventative Screening Examination   2. Essential hypertension  - Urinalysis, Routine w reflex microscopic - EKG 12-Lead  3. Hyperlipidemia, mixed  - Lipid panel - EKG 12-Lead  4. Abnormal glucose  - Hemoglobin A1c - Insulin, random - EKG 12-Lead  5. Vitamin D deficiency   6. Hypothyroidism   7. Depression, major, recurrent  ([place])   8. Chronic back pain   9. Screening for colorectal cancer  - POC Hemoccult Bld/Stl   10. Screening for heart disease  - EKG 12-Lead  11. FHx: heart disease  - EKG 12-Lead  12. Former smoker  - EKG 12-Lead  13. Medication management  - Lipid panel - Hemoglobin A1c - Insulin, random - Urinalysis,  Routine w reflex microscopic - POC Hemoccult Bld/Stl           Patient was counseled in prudent diet to achieve/maintain BMI less than 25 for weight control, BP monitoring, regular exercise and medications. Discussed med's effects and SE's. 3 weeks ago , patient underwent extensive lab screening & all tests were WNL.  Routine Screening labs and tests  not completed 3 weeks ago are done today.  Over 40 minutes of exam, counseling, chart review and high complex critical decision making was performed.   Kirtland Bouchard, MD

## 2022-10-01 LAB — HEMOGLOBIN A1C
Hgb A1c MFr Bld: 5.7 % of total Hgb — ABNORMAL HIGH (ref <5.7)
Mean Plasma Glucose: 117 mg/dL
eAG (mmol/L): 6.5 mmol/L

## 2022-10-01 LAB — LIPID PANEL
Cholesterol: 134 mg/dL (ref <200)
HDL: 41 mg/dL — ABNORMAL LOW (ref >=50)
LDL Cholesterol (Calc): 70 mg/dL (calc)
Non-HDL Cholesterol (Calc): 93 mg/dL (calc) (ref <130)
Total CHOL/HDL Ratio: 3.3 (calc) (ref <5.0)
Triglycerides: 150 mg/dL — ABNORMAL HIGH (ref <150)

## 2022-10-01 LAB — URINALYSIS, ROUTINE W REFLEX MICROSCOPIC
Bilirubin Urine: NEGATIVE
Glucose, UA: NEGATIVE
Hgb urine dipstick: NEGATIVE
Ketones, ur: NEGATIVE
Leukocytes,Ua: NEGATIVE
Nitrite: NEGATIVE
Protein, ur: NEGATIVE
Specific Gravity, Urine: 1.02 (ref 1.001–1.035)
pH: 6.5 (ref 5.0–8.0)

## 2022-10-01 LAB — INSULIN, RANDOM: Insulin: 10.3 u[IU]/mL

## 2022-10-01 NOTE — Progress Notes (Signed)
<><><><><><><><><><><><><><><><><><><><><><><><><><><><><><><><><> <><><><><><><><><><><><><><><><><><><><><><><><><><><><><><><><><> -   Test results slightly outside the reference range are not unusual.  If there is anything important, I will review this with you,  otherwise it is considered normal test values.  If you have further questions,  please do not hesitate to contact me at the office or via My Chart.  <><><><><><><><><><><><><><><><><><><><><><><><><><><><><><><><><> <><><><><><><><><><><><><><><><><><><><><><><><><><><><><><><><><>  -  Total  Chol =   134     - Excellent             (  Ideal  or  Goal is less than 180  !  )  & -  Bad / Dangerous LDL  Chol =   70    - also Excellent              (  Ideal  or  Goal is less than 70  !  )    - Very low risk for Heart Attack  / Stroke  <><><><><><><><><><><><><><><><><><><><><><><><><><><><><><><><><>  -  A1c = 5.7%  -  Blood sugar and A1c are elevated in the borderline and  early or pre-diabetes range which has the same   300% increased risk for heart attack, stroke, cancer and   alzheimer- type vascular dementia as full blown diabetes.   But the good news is that diet, exercise with  weight loss can cure the early diabetes at this point.   So . . . . . . . . . . . Marland Kitchen  - Avoid Sweets, Candy & White Stuff   - White Rice, White [place], [place] Flour  - Breads &  Pasta <><><><><><><><><><><><><><><><><><><><><><><><><><><><><><><><><> All Else - CBC - Kidneys - Electrolytes - Liver - Magnesium & Thyroid    - all  Normal / OK <><><><><><><><><><><><><><><><><><><><><><><><><><><><><><><><><>

## 2022-10-04 ENCOUNTER — Ambulatory Visit (HOSPITAL_COMMUNITY): Payer: PPO | Admitting: Licensed Clinical Social Worker

## 2022-10-04 DIAGNOSIS — F411 Generalized anxiety disorder: Secondary | ICD-10-CM | POA: Diagnosis not present

## 2022-10-04 DIAGNOSIS — F331 Major depressive disorder, recurrent, moderate: Secondary | ICD-10-CM

## 2022-10-04 NOTE — Progress Notes (Signed)
THERAPIST PROGRESS NOTE   Session Time: 2:00pm - 3:00pm    Location: Patient: [facility] Provider: [facility] [facility]   Participation Level: Active    Behavioral Response: Alert, casually dressed, depressed mood/affect    Type of Therapy:  Individual Therapy   Treatment Goals addressed: Depression and anxiety management; Medication compliance; Attending bible study weekly  Progress Towards Goals: Progressing   Interventions: CBT: core beliefs     Summary: Sarah Rodriguez is a 66 year old married Caucasian female on disability that presented for therapy appointment today with diagnoses of Major depressive disorder, recurrent, mild; and Generalized Anxiety Disorder.        Suicidal/Homicidal: None; without intent or plan.     Therapist Response:  Clinician met with Sarah Rodriguez for in person therapy session today, and assessed for safety, medication compliance, and sobriety.  Sarah Rodriguez presented for appointment on time, and was alert, oriented x5, with no evidence or self-report of active SI/HI or A/V H.  Sarah Rodriguez reported ongoing compliance with medication and denied use of alcohol or illicit substances. Clinician inquired about Sarah Rodriguez's emotional ratings today, as well as any significant changes in thoughts, feelings, or behavior since previous check-in.  Sarah Rodriguez reported scores of 7/10 for depression, 6/10 for anxiety, and 3/10 for anger/irritability.  She denied any recent panic attacks or outbursts.  Sarah Rodriguez reported that a recent success was travelling out of town with her husband to visit her daughter, who is struggling with her pregnancy.  Sarah Rodriguez reported that her daughter can be very aggressive due to hormones, but despite this Sarah Rodriguez was able to stay calm, and address some recent issues assertively.  Sarah Rodriguez reported that she also continues attending weekly bible study sessions, and finds this helpful for getting out of the house, and socializing with other women.  Sarah Rodriguez reported that she still occasionally  struggles with negative view of self and thought patterns, noting that lately she has felt that she "Isn't special".  Clinician suggested revisiting topic of core beliefs today to assist.  Clinician reminded Sarah Rodriguez that core beliefs are the most central ideas one holds about themselves, which can also influence automatic negative thoughts.  Clinician utilized CBT concept to explain how this thinking affects feelings and behavior in turn, and examples of common negative core beliefs were listed, such as "I am a failure", "I am unlovable", and "Nothing ever goes right".  Consequences of allowing core beliefs to remain were listed as well, including tendency to distrust others, aggressive behavior, unhealthy boundaries, and worsening anxiety, depression, or reliance upon drugs/alcohol to cope.  Clinician tasked Sarah Rodriguez with challenging the previously noted belief "I am not special" by looking for evidence that contradicts this belief.  Intervention was effective, as evidenced by Sarah Rodriguez participating in discussion on subject, and successfully challenging this core belief by reporting several positive traits which make her special, such as her ability to navigate new boundaries with her daughter after an extended time without communication, using assertive communication skills to stand up for herself more often, and serving as a resource to the daughter during this pregnancy due to life experience as a parent.  Clinician will continue to monitor.              Plan: Follow up for next session in 1 month.   Diagnosis: Major depressive disorder, recurrent, mild; and Generalized Anxiety Disorder.  Collaboration of Care:   No collaboration of care required for this visit.  Patient/Guardian was advised Release of Information must be obtained prior to any record release in order to collaborate their care with an outside provider. Patient/Guardian was advised if they have not  already done so to contact the registration department to sign all necessary forms in order for [place] to release information regarding their care.    Consent: Patient/Guardian gives verbal consent for treatment and assignment of benefits for services provided during this visit. Patient/Guardian expressed understanding and agreed to proceed.   Shade Flood, [place], LCAS 10/04/22

## 2022-10-06 ENCOUNTER — Ambulatory Visit: Payer: PPO | Attending: Otolaryngology

## 2022-10-06 DIAGNOSIS — M6281 Muscle weakness (generalized): Secondary | ICD-10-CM | POA: Insufficient documentation

## 2022-10-06 DIAGNOSIS — R2681 Unsteadiness on feet: Secondary | ICD-10-CM | POA: Insufficient documentation

## 2022-10-07 ENCOUNTER — Encounter: Payer: Self-pay | Admitting: Internal Medicine

## 2022-10-13 ENCOUNTER — Ambulatory Visit: Payer: PPO | Admitting: Physical Therapy

## 2022-10-13 ENCOUNTER — Encounter: Payer: Self-pay | Admitting: Physical Therapy

## 2022-10-13 DIAGNOSIS — M6281 Muscle weakness (generalized): Secondary | ICD-10-CM

## 2022-10-13 DIAGNOSIS — R2681 Unsteadiness on feet: Secondary | ICD-10-CM

## 2022-10-13 NOTE — Therapy (Signed)
OUTPATIENT PHYSICAL THERAPY VESTIBULAR TREATMENT NOTE     Patient Name: Sarah Rodriguez MRN: [id number] DOB:[birth year], 66 y.o., female Today's Date: 10/13/2022  PCP: Unk Pinto, MD REFERRING PROVIDER: Raylene Miyamoto, MD      PT End of Session - 10/13/22 1533     Visit Number 13    Number of Visits 15    Date for PT Re-Evaluation 09/47/09   per recert [id number]/83/6629   Authorization Type [employer]    PT Start Time 4765    PT Stop Time 1615    PT Time Calculation (min) 40 min    Activity Tolerance Patient tolerated treatment well;Other (comment);Patient limited by pain   decreased pain at end of session   Behavior During Therapy [facility] for tasks assessed/performed                   Past Medical History:  Diagnosis Date   Asthma    Dulera daily   Chronic back pain    HNP   Complication of anesthesia    slow to wake up   Depression    takes Clonazepam nightly   GERD (gastroesophageal reflux disease)    takes Protonix daily    Heart murmur    History of migraine    takes Relpax daily as needed   HSV (herpes simplex virus) infection    Hyperlipidemia    takes Atorvastatin daily   Hypertension    takes Benicar daily   Hypothyroidism    takes Synthroid daily   MVP (mitral valve prolapse)    Pneumonia    hx of--been many yrs ago   Seasonal allergies    takes Allegra daily   Shortness of breath    with exertion   Past Surgical History:  Procedure Laterality Date   ABLATION  2011   Uterine   buttocks surgery     at age 16 (coccyx repair)   CERVICAL FUSION  03/2010   CESAREAN SECTION  1992   CHOLECYSTECTOMY  2008   COLONOSCOPY     DILATION AND CURETTAGE OF UTERUS  2011   ESOPHAGOGASTRODUODENOSCOPY     LUMBAR LAMINECTOMY/DECOMPRESSION MICRODISCECTOMY  10/24/2012   Procedure: LUMBAR LAMINECTOMY/DECOMPRESSION MICRODISCECTOMY 1 LEVEL;  Surgeon: Kristeen Miss, MD;  Location: [facility];  Service: Neurosurgery;  Laterality: Right;  Right Lumbar  five-Sacral One Microdiskectomy   right knee arthroscopy     x 3   TONSILLECTOMY     at age 27   Patient Active Problem List   Diagnosis Date Noted   Sleep apnea 06/12/2020   Deviated septum 03/14/2018   Chronic back pain 12/21/2017   Depression, major, recurrent, in partial remission ([place]) 08/05/2016   Degenerative joint disease 08/05/2016   Migraine 03/03/2016   Spondylolisthesis at L4-L5 level 10/27/2015   Vitamin D deficiency 04/17/2014   Medication management 04/17/2014   Hyperlipidemia, mixed    Hypertension    Asthma    GERD (gastroesophageal reflux disease)    Hypothyroidism     ONSET DATE: couple months ago  REFERRING DIAG: R42 (ICD-10-CM) - Dizziness and giddiness  THERAPY DIAG:  Unsteadiness on feet  Muscle weakness (generalized)  Rationale for Evaluation and Treatment Rehabilitation  SUBJECTIVE:   SUBJECTIVE STATEMENT: Had some trouble with the sumo squats and lifts, not sure I'm doing the exercises right.  Really began to bother my back, so bad that I had to cancel last week. Have been in pain in R side of low back. Pt accompanied by: self  PERTINENT HISTORY: asthma, depression, GERD, migraine, HLD, HTN, cervical fusion 2011, L5-S1 lami 2013, R knee scope x3   PAIN:  Are you having pain? Yes: NPRS scale: 7-8/10 Pain location: R low back Pain description: soreness, achy Aggravating factors: worse with some of the exercises Relieving factors: lying down, warm heat   PRECAUTIONS: Fall   PATIENT GOALS improve balance and LE strength  OBJECTIVE:   Pt is tender to palpation along right paraspinals of lower thoracic/upper lumbar region.  With palpation, gentle massage to area, pt reports short term relief.  No radiating symptoms into RLE.  TODAY'S TREATMENT: 10/13/2022 Activity Comments  Supine pelvic tilts, 2 x 5 reps, anterior Pain with posterior pelvic tilts, so only performed neutral>anterior  Supine lumbar flexibility -single knee to  chest -lower trunk rotation Stopped due to increased pain   Standing lumbar flexibility: -lumbar flexion/extension at counter, with initial relief, then pain -lateral weightshifting through hips-no c/o pain   Prone position x 5 minutes No increase in pain  Ice massage (using cold probe) to tender area x 5 minutes Reports decreased pain to 3-4/10 after prone positioning and ice massage  Short distance gait, 50 ft x 4 reps Cues for abdominal activation, pt c/o heaviness in low back  Education provided-see below       PATIENT EDUCATION: Education details: Discussed pt's lumbar pain-tat it appears to be muscular in nature.  Discussed avoiding painful movements (no squats or weighted lower extremity exercises this week, as they bring on pain).    Discussed working in painfree motions and using ice massage and prone positioning 5 minutes 1-2x/day to help lessen pain.  Okay to continue balance exercises if they don't bring on pain. Person educated: Patient Education method: Explanation, Demonstration, and Handouts Education comprehension: verbalized understanding and returned demonstration  Access Code: [email or web address] URL: [email or web address] Date: 10/13/2022 Prepared by: [facility]  Exercises - Side to side weightshift  - 1-2 x daily - 7 x weekly - 1-2 sets - 10 reps    Access Code: [email or web address] URL: [email or web address] Date: 09/28/2022-most recent update Prepared by: [facility]  Exercises - Sidelying Hip Abduction  - 1 x daily - 5 x weekly - 2 sets - 10 reps - Sidelying Hip Adduction  - 1 x daily - 5 x weekly - 2 sets - 10 reps - Romberg Stance Eyes Closed on Foam Pad  - 1 x daily - 5 x weekly - 2-3 sets - 30 sec hold - Standing Alternating Knee Flexion with Ankle Weights  - 1 x daily - 5 x weekly - 2 sets - 10 reps - [place] with Swiss Ball  - 1 x daily - 5 x weekly - 2 sets  - 10 reps - Romberg Stance on Foam Pad with Head Rotation  - 1 x daily - 5 x weekly - 2 sets - 10 reps - Romberg Stance with Head Nods on Foam Pad  - 1 x daily - 5 x weekly - 2 sets - 10 reps - Goblet Squat with Kettlebell  - 1 x daily - 7 x weekly - 5 sets - 5 reps - Sumo Squat with Dumbbell  - 1 x daily - 7 x weekly - 5 sets - 5 reps - Farmer's Walk  - 1 x daily - 7 x weekly - 3 sets - 10 reps - Tandem Walking with Head Rotations  - 1 x daily -  7 x weekly - 3 sets - 10 reps - Single Leg Stance on Foam Pad  - 1 x daily - 5 x weekly - 1 sets - 3 reps - 10 sec hold - [place] with Counter Support  - 1 x daily - 5 x weekly - 1 sets - 3 reps (educated 10/13/2022 to perform every other day)     Below measures were taken at time of initial evaluation unless otherwise specified:   DIAGNOSTIC FINDINGS: none  COGNITION: Overall cognitive status: Within functional limits for tasks assessed   SENSATION: WFL  POSTURE: forward head; posterior weight shift in standing   LOWER EXTREMITY ROM:     Active  Right eval Left eval  Hip flexion    Hip extension    Hip abduction    Hip adduction    Hip internal rotation    Hip external rotation    Knee flexion    Knee extension    Ankle dorsiflexion 13 10  Ankle plantarflexion    Ankle inversion    Ankle eversion     (Blank rows = not tested)   LOWER EXTREMITY MMT:    MMT Right eval Left eval Right 09/23/22 Left  09/23/22  Hip flexion 4+ 4+ 5 5  Hip extension   3+ 3+  Hip abduction 4- 4- 4 4-  Hip adduction _0 Hip internal rotation      Hip external rotation      Knee flexion 3+ 4+ 4 4+  Knee extension 4+ 4+ 5 5  Ankle dorsiflexion 4+ 4+    Ankle plantarflexion 4+ (19 reps) 4+ (15 reps)    Ankle inversion      Ankle eversion       (Blank rows = not tested)    GAIT: Gait pattern: step through pattern Assistive device utilized: None Level of assistance: Complete Independence   FUNCTIONAL TESTs:         M-CTSIB  Condition 1: Firm Surface, EO 30 Sec, Normal Sway  Condition 2: Firm Surface, EC 30 Sec, Normal and Mild Sway  Condition 3: Foam Surface, EO 30 Sec, Moderate Sway  Condition 4: Foam Surface, EC 30 Sec, Moderate and Severe Sway     PATIENT SURVEYS:  FOTO NT d/t patient declining dizziness   HOME EXERCISE PROGRAM Access Code: [email or web address] URL: [email or web address] Date: 08/12/2022 Prepared by: [facility]  Exercises - Sidelying Hip Abduction  - 1 x daily - 5 x weekly - 2 sets - 10 reps - Sidelying Hip Adduction  - 1 x daily - 5 x weekly - 2 sets - 10 reps - Standing Hamstring Curl with Resistance  - 1 x daily - 5 x weekly - 2 sets - 10 reps - Squat with Chair Touch  - 1 x daily - 5 x weekly - 2 sets - 10 reps - Romberg Stance on Foam Pad  - 1 x daily - 5 x weekly - 2-3 sets - 30 sec hold - Romberg Stance Eyes Closed on Foam Pad  - 1 x daily - 5 x weekly - 2-3 sets - 30 sec hold    PATIENT EDUCATION: Education details: prognosis, POC, HEP Person educated: Patient Education method: Explanation, Demonstration, Tactile cues, Verbal cues, and Handouts Education comprehension: verbalized understanding and returned demonstration   GOALS: Goals reviewed with patient? Yes  SHORT TERM GOALS: Target date: 09/02/2022  Patient to be independent with initial HEP. Baseline: HEP initiated  Goal status: MET  LONG TERM GOALS: Target date: 09/23/2022>UPDATED FOR LTGS 1 and 2, target date 10/22/2022  1-Patient to be independent with advanced HEP. Baseline: Not yet initiated  Goal status: GOAL ONGOING, 09/23/2022  2-Patient to score at least 4/5 with LE MMT.  Baseline: see above (improvements, but glut strength 3+/5) Goal status: GOAL PARTIALLY MET/ONGOING 09/23/2022  Patient to demonstrate mild sway with M-CTSIB condition with eyes closed/foam surface in order to improve safety in environments with uneven surfaces  and dim lighting.  Baseline: mod-severe; mild sway Goal status: GOAL MET, 09/20/2022  Patient to score at least 25/30 on FGA in order to decrease risk of falls.  Baseline: 23>26/30 09/20/2022 Goal status: GOAL MET    ASSESSMENT:  CLINICAL IMPRESSION: Pt presents today to PT session with reports of approx 1 week of pain in right side of thoraco-lumbar area.  She is unsure what contributed, but has had questions about the sumo/goblet squats and unsure if she may have overdone them.  No radiating pain, but she does report tenderness to palpation to R side paraspinal musculature in lower thoracic/lumbar area.  Addressed pt's concerns with education to hold on weighted activities to calm pain down in the muscle area.  Educated pt on ice massage and prone positioning and gentle pain-free motions.  Will need to discuss with her next week her POC, as LTGs are due and she cancelled last visit due to pain.       OBJECTIVE IMPAIRMENTS decreased balance, decreased strength, postural dysfunction, and pain.   ACTIVITY LIMITATIONS carrying, lifting, bending, squatting, and stairs  PARTICIPATION LIMITATIONS: meal prep, cleaning, laundry, shopping, community activity, and yard work  PERSONAL FACTORS Age, Past/current experiences, Time since onset of injury/illness/exacerbation, and 3+ comorbidities: asthma, depression, GERD, migraine, HLD, HTN, cervical fusion 2011, L5-S1 lami 2013, R knee scope x3  are also affecting patient's functional outcome.   REHAB POTENTIAL: Good  CLINICAL DECISION MAKING: Evolving/moderate complexity  EVALUATION COMPLEXITY: Moderate   PLAN: PT FREQUENCY: 1x/week  PT DURATION: 4 weeks(per recert 75/17/0017)  PLANNED INTERVENTIONS: Therapeutic exercises, Therapeutic activity, Neuromuscular re-education, Balance training, Gait training, Patient/Family education, Self Care, Joint mobilization, Stair training, Vestibular training, Canalith repositioning, Aquatic Therapy, Dry  Needling, Cryotherapy, Moist heat, Taping, Manual therapy, and Re-evaluation  PLAN FOR NEXT SESSION:  Discuss POC and assess LTGs.  Assess back pain and add back in appropriate squats and weighted exercises.  Continue to work on SLS on compliant surface; review addition to HEP this visit.  Work on monster walks/resisted sidestepping with progression to green theraband for hip stability.    Mady Haagensen, PT 10/13/22 5:40 PM Phone: [phone] Fax: [phone]    [facility] at [facility] [address], [place], [place] [address] Phone # [phone] Fax # [phone]

## 2022-10-20 ENCOUNTER — Ambulatory Visit: Payer: PPO | Admitting: Physical Therapy

## 2022-10-20 NOTE — Therapy (Incomplete)
OUTPATIENT PHYSICAL THERAPY VESTIBULAR TREATMENT NOTE     Patient Name: Sarah Rodriguez MRN: [id number] DOB:11-08-1956, 66 y.o., female Today's Date: 10/13/2022  PCP: Unk Pinto, MD REFERRING PROVIDER: Raylene Miyamoto, MD      PT End of Session - 10/13/22 1533     Visit Number 13    Number of Visits 15    Date for PT Re-Evaluation 96/29/52   per recert 84/13/2440   Authorization Type [employer]    PT Start Time 1027    PT Stop Time 1615    PT Time Calculation (min) 40 min    Activity Tolerance Patient tolerated treatment well;Other (comment);Patient limited by pain   decreased pain at end of session   Behavior During Therapy [facility] for tasks assessed/performed                   Past Medical History:  Diagnosis Date   Asthma    Dulera daily   Chronic back pain    HNP   Complication of anesthesia    slow to wake up   Depression    takes Clonazepam nightly   GERD (gastroesophageal reflux disease)    takes Protonix daily    Heart murmur    History of migraine    takes Relpax daily as needed   HSV (herpes simplex virus) infection    Hyperlipidemia    takes Atorvastatin daily   Hypertension    takes Benicar daily   Hypothyroidism    takes Synthroid daily   MVP (mitral valve prolapse)    Pneumonia    hx of--been many yrs ago   Seasonal allergies    takes Allegra daily   Shortness of breath    with exertion   Past Surgical History:  Procedure Laterality Date   ABLATION  2011   Uterine   buttocks surgery     at age 56 (coccyx repair)   CERVICAL FUSION  03/2010   CESAREAN SECTION  1992   CHOLECYSTECTOMY  2008   COLONOSCOPY     DILATION AND CURETTAGE OF UTERUS  2011   ESOPHAGOGASTRODUODENOSCOPY     LUMBAR LAMINECTOMY/DECOMPRESSION MICRODISCECTOMY  10/24/2012   Procedure: LUMBAR LAMINECTOMY/DECOMPRESSION MICRODISCECTOMY 1 LEVEL;  Surgeon: Kristeen Miss, MD;  Location: [facility];  Service: Neurosurgery;  Laterality: Right;  Right Lumbar  five-Sacral One Microdiskectomy   right knee arthroscopy     x 3   TONSILLECTOMY     at age 33   Patient Active Problem List   Diagnosis Date Noted   Sleep apnea 06/12/2020   Deviated septum 03/14/2018   Chronic back pain 12/21/2017   Depression, major, recurrent, in partial remission ([place]) 08/05/2016   Degenerative joint disease 08/05/2016   Migraine 03/03/2016   Spondylolisthesis at L4-L5 level 10/27/2015   Vitamin D deficiency 04/17/2014   Medication management 04/17/2014   Hyperlipidemia, mixed    Hypertension    Asthma    GERD (gastroesophageal reflux disease)    Hypothyroidism     ONSET DATE: couple months ago  REFERRING DIAG: R42 (ICD-10-CM) - Dizziness and giddiness  THERAPY DIAG:  Unsteadiness on feet  Muscle weakness (generalized)  Rationale for Evaluation and Treatment Rehabilitation  SUBJECTIVE:   SUBJECTIVE STATEMENT: ***Had some trouble with the sumo squats and lifts, not sure I'm doing the exercises right.  Really began to bother my back, so bad that I had to cancel last week. Have been in pain in R side of low back. Pt accompanied by: self  PERTINENT HISTORY: asthma, depression, GERD, migraine, HLD, HTN, cervical fusion 2011, L5-S1 lami 2013, R knee scope x3   PAIN:  Are you having pain? Yes: NPRS scale: 7-8/10 Pain location: R low back Pain description: soreness, achy Aggravating factors: worse with some of the exercises Relieving factors: lying down, warm heat   PRECAUTIONS: Fall   PATIENT GOALS improve balance and LE strength  OBJECTIVE:    TODAY'S TREATMENT: 10/20/2022 Activity Comments                      Pt is tender to palpation along right paraspinals of lower thoracic/upper lumbar region.  With palpation, gentle massage to area, pt reports short term relief.  No radiating symptoms into RLE.  TODAY'S TREATMENT: 10/13/2022 Activity Comments  Supine pelvic tilts, 2 x 5 reps, anterior Pain with posterior pelvic tilts, so  only performed neutral>anterior  Supine lumbar flexibility -single knee to chest -lower trunk rotation Stopped due to increased pain   Standing lumbar flexibility: -lumbar flexion/extension at counter, with initial relief, then pain -lateral weightshifting through hips-no c/o pain   Prone position x 5 minutes No increase in pain  Ice massage (using cold probe) to tender area x 5 minutes Reports decreased pain to 3-4/10 after prone positioning and ice massage  Short distance gait, 50 ft x 4 reps Cues for abdominal activation, pt c/o heaviness in low back  Education provided-see below       PATIENT EDUCATION: Education details: Discussed pt's lumbar pain-tat it appears to be muscular in nature.  Discussed avoiding painful movements (no squats or weighted lower extremity exercises this week, as they bring on pain).    Discussed working in painfree motions and using ice massage and prone positioning 5 minutes 1-2x/day to help lessen pain.  Okay to continue balance exercises if they don't bring on pain. Person educated: Patient Education method: Explanation, Demonstration, and Handouts Education comprehension: verbalized understanding and returned demonstration  Access Code: [email or web address] URL: [email or web address] Date: 10/13/2022 Prepared by: [facility]  Exercises - Side to side weightshift  - 1-2 x daily - 7 x weekly - 1-2 sets - 10 reps    Access Code: [email or web address] URL: [email or web address] Date: 09/28/2022-most recent update Prepared by: [facility]  Exercises - Sidelying Hip Abduction  - 1 x daily - 5 x weekly - 2 sets - 10 reps - Sidelying Hip Adduction  - 1 x daily - 5 x weekly - 2 sets - 10 reps - Romberg Stance Eyes Closed on Foam Pad  - 1 x daily - 5 x weekly - 2-3 sets - 30 sec hold - Standing Alternating Knee Flexion with Ankle Weights  - 1 x daily - 5 x weekly - 2 sets - 10  reps - [place] with Swiss Ball  - 1 x daily - 5 x weekly - 2 sets - 10 reps - Romberg Stance on Foam Pad with Head Rotation  - 1 x daily - 5 x weekly - 2 sets - 10 reps - Romberg Stance with Head Nods on Foam Pad  - 1 x daily - 5 x weekly - 2 sets - 10 reps - Goblet Squat with Kettlebell  - 1 x daily - 7 x weekly - 5 sets - 5 reps - Sumo Squat with Dumbbell  - 1 x daily - 7 x weekly - 5 sets - 5 reps - Farmer's  Walk  - 1 x daily - 7 x weekly - 3 sets - 10 reps - Tandem Walking with Head Rotations  - 1 x daily - 7 x weekly - 3 sets - 10 reps - Single Leg Stance on Foam Pad  - 1 x daily - 5 x weekly - 1 sets - 3 reps - 10 sec hold - [place] with Counter Support  - 1 x daily - 5 x weekly - 1 sets - 3 reps (educated 10/13/2022 to perform every other day)     Below measures were taken at time of initial evaluation unless otherwise specified:   DIAGNOSTIC FINDINGS: none  COGNITION: Overall cognitive status: Within functional limits for tasks assessed   SENSATION: WFL  POSTURE: forward head; posterior weight shift in standing   LOWER EXTREMITY ROM:     Active  Right eval Left eval  Hip flexion    Hip extension    Hip abduction    Hip adduction    Hip internal rotation    Hip external rotation    Knee flexion    Knee extension    Ankle dorsiflexion 13 10  Ankle plantarflexion    Ankle inversion    Ankle eversion     (Blank rows = not tested)   LOWER EXTREMITY MMT:    MMT Right eval Left eval Right 09/23/22 Left  09/23/22  Hip flexion 4+ 4+ 5 5  Hip extension   3+ 3+  Hip abduction 4- 4- 4 4-  Hip adduction _0 Hip internal rotation      Hip external rotation      Knee flexion 3+ 4+ 4 4+  Knee extension 4+ 4+ 5 5  Ankle dorsiflexion 4+ 4+    Ankle plantarflexion 4+ (19 reps) 4+ (15 reps)    Ankle inversion      Ankle eversion       (Blank rows = not tested)    GAIT: Gait pattern: step through pattern Assistive device utilized: None Level  of assistance: Complete Independence   FUNCTIONAL TESTs:        M-CTSIB  Condition 1: Firm Surface, EO 30 Sec, Normal Sway  Condition 2: Firm Surface, EC 30 Sec, Normal and Mild Sway  Condition 3: Foam Surface, EO 30 Sec, Moderate Sway  Condition 4: Foam Surface, EC 30 Sec, Moderate and Severe Sway     PATIENT SURVEYS:  FOTO NT d/t patient declining dizziness   HOME EXERCISE PROGRAM Access Code: [email or web address] URL: [email or web address] Date: 08/12/2022 Prepared by: [facility]  Exercises - Sidelying Hip Abduction  - 1 x daily - 5 x weekly - 2 sets - 10 reps - Sidelying Hip Adduction  - 1 x daily - 5 x weekly - 2 sets - 10 reps - Standing Hamstring Curl with Resistance  - 1 x daily - 5 x weekly - 2 sets - 10 reps - Squat with Chair Touch  - 1 x daily - 5 x weekly - 2 sets - 10 reps - Romberg Stance on Foam Pad  - 1 x daily - 5 x weekly - 2-3 sets - 30 sec hold - Romberg Stance Eyes Closed on Foam Pad  - 1 x daily - 5 x weekly - 2-3 sets - 30 sec hold    PATIENT EDUCATION: Education details: prognosis, POC, HEP Person educated: Patient Education method: Explanation, Demonstration, Tactile cues, Verbal cues, and Handouts Education comprehension: verbalized understanding and  returned demonstration   GOALS: Goals reviewed with patient? Yes  SHORT TERM GOALS: Target date: 09/02/2022  Patient to be independent with initial HEP. Baseline: HEP initiated Goal status: MET  LONG TERM GOALS: Target date: 09/23/2022>UPDATED FOR LTGS 1 and 2, target date 10/22/2022  1-Patient to be independent with advanced HEP. Baseline: Not yet initiated  Goal status: GOAL ONGOING, 09/23/2022  2-Patient to score at least 4/5 with LE MMT.  Baseline: see above (improvements, but glut strength 3+/5) Goal status: GOAL PARTIALLY MET/ONGOING 09/23/2022  Patient to demonstrate mild sway with M-CTSIB condition with eyes closed/foam surface in  order to improve safety in environments with uneven surfaces and dim lighting.  Baseline: mod-severe; mild sway Goal status: GOAL MET, 09/20/2022  Patient to score at least 25/30 on FGA in order to decrease risk of falls.  Baseline: 23>26/30 09/20/2022 Goal status: GOAL MET    ASSESSMENT:  CLINICAL IMPRESSION: ***Pt presents today to PT session with reports of approx 1 week of pain in right side of thoraco-lumbar area.  She is unsure what contributed, but has had questions about the sumo/goblet squats and unsure if she may have overdone them.  No radiating pain, but she does report tenderness to palpation to R side paraspinal musculature in lower thoracic/lumbar area.  Addressed pt's concerns with education to hold on weighted activities to calm pain down in the muscle area.  Educated pt on ice massage and prone positioning and gentle pain-free motions.  Will need to discuss with her next week her POC, as LTGs are due and she cancelled last visit due to pain.       OBJECTIVE IMPAIRMENTS decreased balance, decreased strength, postural dysfunction, and pain.   ACTIVITY LIMITATIONS carrying, lifting, bending, squatting, and stairs  PARTICIPATION LIMITATIONS: meal prep, cleaning, laundry, shopping, community activity, and yard work  PERSONAL FACTORS Age, Past/current experiences, Time since onset of injury/illness/exacerbation, and 3+ comorbidities: asthma, depression, GERD, migraine, HLD, HTN, cervical fusion 2011, L5-S1 lami 2013, R knee scope x3  are also affecting patient's functional outcome.   REHAB POTENTIAL: Good  CLINICAL DECISION MAKING: Evolving/moderate complexity  EVALUATION COMPLEXITY: Moderate   PLAN: PT FREQUENCY: 1x/week  PT DURATION: 4 weeks(per recert 16/08/9603)  PLANNED INTERVENTIONS: Therapeutic exercises, Therapeutic activity, Neuromuscular re-education, Balance training, Gait training, Patient/Family education, Self Care, Joint mobilization, Stair training,  Vestibular training, Canalith repositioning, Aquatic Therapy, Dry Needling, Cryotherapy, Moist heat, Taping, Manual therapy, and Re-evaluation  PLAN FOR NEXT SESSION:  ***Discuss POC and assess LTGs.  Assess back pain and add back in appropriate squats and weighted exercises.  Continue to work on SLS on compliant surface; review addition to HEP this visit.  Work on monster walks/resisted sidestepping with progression to green theraband for hip stability.    Mady Haagensen, PT 10/13/22 5:40 PM Phone: [phone] Fax: [phone]    [facility] at [facility], [place],  [address] Phone # [phone] Fax # [phone]

## 2022-10-28 ENCOUNTER — Ambulatory Visit: Payer: PPO | Admitting: Physical Therapy

## 2022-10-28 ENCOUNTER — Encounter: Payer: Self-pay | Admitting: Physical Therapy

## 2022-10-28 DIAGNOSIS — R2681 Unsteadiness on feet: Secondary | ICD-10-CM | POA: Diagnosis not present

## 2022-10-28 DIAGNOSIS — M6281 Muscle weakness (generalized): Secondary | ICD-10-CM

## 2022-10-28 NOTE — Therapy (Signed)
OUTPATIENT PHYSICAL THERAPY VESTIBULAR TREATMENT NOTE/RECERT/DISCHARGE SUMMARY     Patient Name: Sarah Rodriguez MRN: [id number] DOB:Feb 07, 1956, 66 y.o., female 80 Date: 10/28/2022  PCP: Unk Pinto, MD REFERRING PROVIDER: Raylene Miyamoto, MD   PHYSICAL THERAPY DISCHARGE SUMMARY  Visits from Start of Care: 14  Current functional level related to goals / functional outcomes: See goal status below   Remaining deficits: High level balance difficulty   Education / Equipment: HEP progression   Patient agrees to discharge. Patient goals were partially met. Patient is being discharged due to being pleased with the current functional level.    PT End of Session - 10/28/22 1022     Visit Number 14    Number of Visits 15    Date for PT Re-Evaluation 10/28/22   50/53/97 completed recert/discharge   Authorization Type [employer]    PT Start Time 1022    PT Stop Time 1056    PT Time Calculation (min) 34 min    Activity Tolerance Patient tolerated treatment well    Behavior During Therapy WFL for tasks assessed/performed                   Past Medical History:  Diagnosis Date   Asthma    Dulera daily   Chronic back pain    HNP   Complication of anesthesia    slow to wake up   Depression    takes Clonazepam nightly   GERD (gastroesophageal reflux disease)    takes Protonix daily    Heart murmur    History of migraine    takes Relpax daily as needed   HSV (herpes simplex virus) infection    Hyperlipidemia    takes Atorvastatin daily   Hypertension    takes Benicar daily   Hypothyroidism    takes Synthroid daily   MVP (mitral valve prolapse)    Pneumonia    hx of--been many yrs ago   Seasonal allergies    takes Allegra daily   Shortness of breath    with exertion   Past Surgical History:  Procedure Laterality Date   ABLATION  2011   Uterine   buttocks surgery     at age 38 (coccyx repair)   CERVICAL FUSION  03/2010   CESAREAN SECTION   1992   CHOLECYSTECTOMY  2008   COLONOSCOPY     DILATION AND CURETTAGE OF UTERUS  2011   ESOPHAGOGASTRODUODENOSCOPY     LUMBAR LAMINECTOMY/DECOMPRESSION MICRODISCECTOMY  10/24/2012   Procedure: LUMBAR LAMINECTOMY/DECOMPRESSION MICRODISCECTOMY 1 LEVEL;  Surgeon: Kristeen Miss, MD;  Location: [facility];  Service: Neurosurgery;  Laterality: Right;  Right Lumbar five-Sacral One Microdiskectomy   right knee arthroscopy     x 3   TONSILLECTOMY     at age 60   Patient Active Problem List   Diagnosis Date Noted   Sleep apnea 06/12/2020   Deviated septum 03/14/2018   Chronic back pain 12/21/2017   Depression, major, recurrent, in partial remission ([place]) 08/05/2016   Degenerative joint disease 08/05/2016   Migraine 03/03/2016   Spondylolisthesis at L4-L5 level 10/27/2015   Vitamin D deficiency 04/17/2014   Medication management 04/17/2014   Hyperlipidemia, mixed    Hypertension    Asthma    GERD (gastroesophageal reflux disease)    Hypothyroidism     ONSET DATE: couple months ago  REFERRING DIAG: R42 (ICD-10-CM) - Dizziness and giddiness  THERAPY DIAG:  Unsteadiness on feet  Muscle weakness (generalized)  Rationale for Evaluation and  Treatment Rehabilitation  SUBJECTIVE:   SUBJECTIVE STATEMENT: Doing okay.  Haven't had the pull sensation in the back.  Some sciatica, but I can improve that with stretching Pt accompanied by: self  PERTINENT HISTORY: asthma, depression, GERD, migraine, HLD, HTN, cervical fusion 2011, L5-S1 lami 2013, R knee scope x3   PAIN:  No pain at session today.   PRECAUTIONS: Fall   PATIENT GOALS improve balance and LE strength  OBJECTIVE:    TODAY'S TREATMENT: 10/28/2022 Activity Comments  Hip abduction:  4/5 BLE, Gluts 3+/5 BLE   Bridging x 10 reps Cues for 3 sec hold  Reviewed/performed bolded exercises below from pt's HEP Cues for neutral spine, abdominal activation  Focused extra time on goblet squats, sumo squats, and farmer's carry  Pt able to perform, but requires cues for technique, cues for neutral spine positioning/abdominal activation             Access Code: [email or web address] URL: [email or web address] Date: 09/28/2022-most recent update Prepared by: [facility]  Exercises - Sidelying Hip Abduction  - 1 x daily - 5 x weekly - 2 sets - 10 reps - Sidelying Hip Adduction  - 1 x daily - 5 x weekly - 2 sets - 10 reps - Romberg Stance Eyes Closed on Foam Pad  - 1 x daily - 5 x weekly - 2-3 sets - 30 sec hold - Standing Alternating Knee Flexion with Ankle Weights  - 1 x daily - 5 x weekly - 2 sets - 10 reps - [place] with Swiss Ball  - 1 x daily - 5 x weekly - 2 sets - 10 reps - Romberg Stance on Foam Pad with Head Rotation  - 1 x daily - 5 x weekly - 2 sets - 10 reps - Romberg Stance with Head Nods on Foam Pad  - 1 x daily - 5 x weekly - 2 sets - 10 reps - Goblet Squat with Kettlebell  - 1 x daily - 7 x weekly - 5 sets - 5 reps - Sumo Squat with Dumbbell  - 1 x daily - 7 x weekly - 5 sets - 5 reps - Farmer's Walk  - 1 x daily - 7 x weekly - 3 sets - 10 reps - Tandem Walking with Head Rotations  - 1 x daily - 7 x weekly - 3 sets - 10 reps - Single Leg Stance on Foam Pad  - 1 x daily - 5 x weekly - 1 sets - 3 reps - 10 sec hold - [place] with Counter Support  - 1 x daily - 5 x weekly - 1 sets - 3 reps (educated 10/13/2022 to perform every other day)   PATIENT EDUCATION: Education details: Review of HEP, plans for d/c this visit Person educated: Patient Education method: Explanation, Demonstration, and Verbal cues Education comprehension: verbalized understanding and returned demonstration   Below measures were taken at time of initial evaluation unless otherwise specified:   DIAGNOSTIC FINDINGS: none  COGNITION: Overall cognitive status: Within functional limits for tasks assessed   SENSATION: WFL  POSTURE: forward head; posterior weight shift in  standing   LOWER EXTREMITY ROM:     Active  Right eval Left eval  Hip flexion    Hip extension    Hip abduction    Hip adduction    Hip internal rotation    Hip external rotation    Knee flexion    Knee extension  Ankle dorsiflexion 13 10  Ankle plantarflexion    Ankle inversion    Ankle eversion     (Blank rows = not tested)   LOWER EXTREMITY MMT:    MMT Right eval Left eval Right 09/23/22 Left  09/23/22  Hip flexion 4+ 4+ 5 5  Hip extension   3+ 3+  Hip abduction 4- 4- 4 4-  Hip adduction _0 Hip internal rotation      Hip external rotation      Knee flexion 3+ 4+ 4 4+  Knee extension 4+ 4+ 5 5  Ankle dorsiflexion 4+ 4+    Ankle plantarflexion 4+ (19 reps) 4+ (15 reps)    Ankle inversion      Ankle eversion       (Blank rows = not tested)    GAIT: Gait pattern: step through pattern Assistive device utilized: None Level of assistance: Complete Independence   FUNCTIONAL TESTs:        M-CTSIB  Condition 1: Firm Surface, EO 30 Sec, Normal Sway  Condition 2: Firm Surface, EC 30 Sec, Normal and Mild Sway  Condition 3: Foam Surface, EO 30 Sec, Moderate Sway  Condition 4: Foam Surface, EC 30 Sec, Moderate and Severe Sway     PATIENT SURVEYS:  FOTO NT d/t patient declining dizziness   HOME EXERCISE PROGRAM Access Code: [email or web address] URL: [email or web address] Date: 08/12/2022 Prepared by: [facility]  Exercises - Sidelying Hip Abduction  - 1 x daily - 5 x weekly - 2 sets - 10 reps - Sidelying Hip Adduction  - 1 x daily - 5 x weekly - 2 sets - 10 reps - Standing Hamstring Curl with Resistance  - 1 x daily - 5 x weekly - 2 sets - 10 reps - Squat with Chair Touch  - 1 x daily - 5 x weekly - 2 sets - 10 reps - Romberg Stance on Foam Pad  - 1 x daily - 5 x weekly - 2-3 sets - 30 sec hold - Romberg Stance Eyes Closed on Foam Pad  - 1 x daily - 5 x weekly - 2-3 sets - 30 sec hold    PATIENT  EDUCATION: Education details: prognosis, POC, HEP Person educated: Patient Education method: Explanation, Demonstration, Tactile cues, Verbal cues, and Handouts Education comprehension: verbalized understanding and returned demonstration   GOALS: Goals reviewed with patient? Yes  SHORT TERM GOALS: Target date: 09/02/2022  Patient to be independent with initial HEP. Baseline: HEP initiated Goal status: MET  LONG TERM GOALS: Target date: 09/23/2022>UPDATED FOR LTGS 1 and 2, target date 10/22/2022  1-Patient to be independent with advanced HEP. Baseline: Not yet initiated  Goal status: GOAL MET 10/28/2022  2-Patient to score at least 4/5 with LE MMT.  Baseline: see above (improvements, but glut strength 3+/5) Goal status: GOAL PARTIALLY MET-glut strength 3+/5 10/28/2022  Patient to demonstrate mild sway with M-CTSIB condition with eyes closed/foam surface in order to improve safety in environments with uneven surfaces and dim lighting.  Baseline: mod-severe; mild sway Goal status: GOAL MET, 09/20/2022  Patient to score at least 25/30 on FGA in order to decrease risk of falls.  Baseline: 23>26/30 09/20/2022 Goal status: GOAL MET    ASSESSMENT:  CLINICAL IMPRESSION: Pt returns to PT today after several weeks (cancelling due to conflicts/holiday).  Assessed LTGs, particularly focusing on review of HEP.  At last visit, pt had significant back pain, but that has subsided.  Pt able to return to performing squat and weighted activities with min cues for good form.  She has met 1 of 2 LTGs.  She has a comprehensive HEP, both for balance, vestibular system training, and strengthening for core/hip stability.  She is appropriate for discharge from PT today.   OBJECTIVE IMPAIRMENTS decreased balance, decreased strength, postural dysfunction, and pain.   ACTIVITY LIMITATIONS carrying, lifting, bending, squatting, and stairs  PARTICIPATION LIMITATIONS: meal prep, cleaning, laundry,  shopping, community activity, and yard work  PERSONAL FACTORS Age, Past/current experiences, Time since onset of injury/illness/exacerbation, and 3+ comorbidities: asthma, depression, GERD, migraine, HLD, HTN, cervical fusion 2011, L5-S1 lami 2013, R knee scope x3  are also affecting patient's functional outcome.   REHAB POTENTIAL: Good  CLINICAL DECISION MAKING: Evolving/moderate complexity  EVALUATION COMPLEXITY: Moderate   PLAN: PT FREQUENCY: 1x/week  PT DURATION: 1 visit 10/28/2022-recert  PLANNED INTERVENTIONS: Therapeutic exercises, Therapeutic activity, Neuromuscular re-education, Balance training, Gait training, Patient/Family education, Self Care, Joint mobilization, Stair training, Vestibular training, Canalith repositioning, Aquatic Therapy, Dry Needling, Cryotherapy, Moist heat, Taping, Manual therapy, and Re-evaluation  PLAN FOR NEXT SESSION:  Discharge PT this session.    Mady Haagensen, PT 10/28/22 12:47 PM Phone: [phone] Fax: [phone]    [facility] at [facility] [address], [place], [place] [address] Phone # [phone] Fax # [phone]

## 2022-11-04 ENCOUNTER — Ambulatory Visit (INDEPENDENT_AMBULATORY_CARE_PROVIDER_SITE_OTHER): Payer: PPO | Admitting: Licensed Clinical Social Worker

## 2022-11-04 DIAGNOSIS — F330 Major depressive disorder, recurrent, mild: Secondary | ICD-10-CM | POA: Diagnosis not present

## 2022-11-04 DIAGNOSIS — F411 Generalized anxiety disorder: Secondary | ICD-10-CM

## 2022-11-04 NOTE — Progress Notes (Signed)
THERAPIST PROGRESS NOTE   Session Time: 1:00pm - 2:00pm     Location: Patient: [facility] [place] [facility] Provider: [facility] [place] [facility]     Participation Level: Active    Behavioral Response: Alert, casually dressed, depressed mood/affect    Type of Therapy:  Individual Therapy   Treatment Goals addressed: Depression and anxiety management; Medication compliance; Attending bible study weekly; Following exercise routine     Progress Towards Goals: Progressing     Interventions: CBT; psychoeducation on seasonal depression and expanding support network    Summary: Kelina Beauchamp is a 66 year old married Caucasian female on disability that presented for therapy appointment today with diagnoses of Major depressive disorder, recurrent, mild; and Generalized Anxiety Disorder.        Suicidal/Homicidal: None; without intent or plan.     Therapist Response:  Clinician met with Latonja for in person therapy appointment today, and assessed for safety, medication compliance, and sobriety.  Natale presented for session on time, and was alert, oriented x5, with no evidence or self-report of active SI/HI or A/V H.  Klara reported ongoing compliance with medication and denied use of alcohol or illicit substances. Clinician inquired about Briannie's current emotional ratings, as well as any significant changes in thoughts, feelings, or behavior since last check-in.  Quanna reported scores of 7/10 for depression, 9/10 for anxiety, and 7/10 for irritability.  She denied any recent panic attacks or outbursts.  Melizza reported that a recent struggle has been noticing a worsening of depression symptoms over the past few weeks, stating "I almost called you to schedule a session sooner".  Clinician encouraged Akiyah to do so next time if she needs more support, and inquired about recent triggers which could have influenced this change in depression severity.  Letoya reported that her daughter has not been talking to her as much, bible study  has ended, and she has been worrying about feeling lonely over the holidays, stating "It feels like I have nobody that cares about me".  Clinician utilized a Geneticist, molecular with Kaelen on topic of seasonal depression to guide discussion.  This handout explained how some people can experience a higher degree of depressive symptoms during fall and winter months as a result of a combination of factors, such as decrease in daylight, disruption in internal body clock, chemical changes, and more.  Clinician also reviewed a list of common symptoms that could serve as warning signs, including lower energy levels, isolation, mood irregularly, sleep, and appetite changes.  Strategies for coping with seasonal depression were offered as well for Anuradha's consideration, such as scheduling time out of the house each day to get sunlight, talking to supportive peers and family to reduce isolation, keeping a diary to track mood changes and schedule for positive self-care activities, as well as discussing this issue with her psychiatrist, and alternative methods for sunlight exposure, such as a light box to use indoors.  Intervention was effective, as evidenced by Manuela Schwartz actively engaging in discussion on subject, acknowledging that isolation with colder weather has likely increased isolation, and she would like to get out more and expand network, but doesn't know how.  Belen was able to come up with ideas to explore for expanding support through discussion with clinician, including mental health support groups, exercise classes at the [facility] or aquatic center, or silver sneakers.  Tyrese reported that due to her anxiety, she needs to focus first on comfortably getting out of the home, and would consider taking a friend from bible  study up on offer to grab lunch sometime.  Clinician will continue to monitor.              Plan: Follow up for next session in 1 month.   Diagnosis: Major depressive disorder, recurrent, mild; and Generalized  Anxiety Disorder.  Collaboration of Care:   No collaboration of care required for this visit.                                                   Patient/Guardian was advised Release of Information must be obtained prior to any record release in order to collaborate their care with an outside provider. Patient/Guardian was advised if they have not already done so to contact the registration department to sign all necessary forms in order for [place] to release information regarding their care.    Consent: Patient/Guardian gives verbal consent for treatment and assignment of benefits for services provided during this visit. Patient/Guardian expressed understanding and agreed to proceed.   Shade Flood, [place], LCAS 11/04/22

## 2022-11-08 ENCOUNTER — Other Ambulatory Visit (HOSPITAL_COMMUNITY): Payer: Self-pay | Admitting: Psychiatry

## 2022-11-08 DIAGNOSIS — F331 Major depressive disorder, recurrent, moderate: Secondary | ICD-10-CM

## 2022-11-11 ENCOUNTER — Ambulatory Visit (HOSPITAL_COMMUNITY): Payer: PPO | Admitting: Psychiatry

## 2022-11-11 ENCOUNTER — Encounter (HOSPITAL_COMMUNITY): Payer: Self-pay | Admitting: Psychiatry

## 2022-11-11 DIAGNOSIS — F411 Generalized anxiety disorder: Secondary | ICD-10-CM

## 2022-11-11 DIAGNOSIS — F331 Major depressive disorder, recurrent, moderate: Secondary | ICD-10-CM

## 2022-11-11 MED ORDER — CLONAZEPAM 0.5 MG PO TABS: ORAL_TABLET | ORAL | 1 refills | Status: DC

## 2022-11-11 MED ORDER — DULOXETINE HCL 20 MG PO CPEP: 40.0000 mg | ORAL_CAPSULE | Freq: Every day | ORAL | 0 refills | Status: DC

## 2022-11-11 MED ORDER — AMITRIPTYLINE HCL 25 MG PO TABS: 25.0000 mg | ORAL_TABLET | Freq: Every day | ORAL | 0 refills | Status: DC

## 2022-11-11 MED ORDER — LAMOTRIGINE 25 MG PO TABS: 25.0000 mg | ORAL_TABLET | Freq: Two times a day (BID) | ORAL | 0 refills | Status: DC

## 2022-11-11 NOTE — Progress Notes (Signed)
[place] MD/PA/NP OP Progress Note   Patient Location: Office Provider Location: Office  11/11/2022 1:16 PM Sarah Rodriguez  MRN:  [id number]   HPI:  Patient came for her follow-up appointment.  He did not tried higher dose of amitriptyline.  She still feels anxious and nervous but manageable.  She has one minor panic attack.  She feels sometime not able to listen very well and her hearing is gradually declining.  She has no tremors, shakes or any EPS.  She denies any suicidal thoughts.  She finished the PT that helps her balance.  She still sleeps 6 to 7 hours.  She is compliant with Lamictal, Cymbalta, amitriptyline 25 mg and she still take one fourth Klonopin and if needed extra one fourth.  Her daughter is pregnant and due in April.  She is going to visit her in December.  Her daughter lives in [place].  Patient reported good support from her husband.   Visit Diagnosis:    ICD-10-CM   1. GAD (generalized anxiety disorder)  F41.1 amitriptyline (ELAVIL) 25 MG tablet    DULoxetine (CYMBALTA) 20 MG capsule    clonazePAM (KLONOPIN) 0.5 MG tablet    2. MDD (major depressive disorder), recurrent episode, moderate (HCC)  F33.1 amitriptyline (ELAVIL) 25 MG tablet    DULoxetine (CYMBALTA) 20 MG capsule    lamoTRIgine (LAMICTAL) 25 MG tablet    clonazePAM (KLONOPIN) 0.5 MG tablet      Past Psychiatric History:  H/O depression and taking antidepressants since 1996.  H/O panic attacks while driving.  Saw Dr. Letta Moynahan and tried Prozac, Paxil, Zoloft, Xanax, Cymbalta and Klonopin.  No h/o inpatient treatment or any suicidal attempt.  Past Medical History:  Past Medical History:  Diagnosis Date   Asthma    Dulera daily   Chronic back pain    HNP   Complication of anesthesia    slow to wake up   Depression    takes Clonazepam nightly   GERD (gastroesophageal reflux disease)    takes Protonix daily    Heart murmur    History of migraine    takes Relpax daily as needed   HSV  (herpes simplex virus) infection    Hyperlipidemia    takes Atorvastatin daily   Hypertension    takes Benicar daily   Hypothyroidism    takes Synthroid daily   MVP (mitral valve prolapse)    Pneumonia    hx of--been many yrs ago   Seasonal allergies    takes Allegra daily   Shortness of breath    with exertion    Past Surgical History:  Procedure Laterality Date   ABLATION  2011   Uterine   buttocks surgery     at age 48 (coccyx repair)   CERVICAL FUSION  03/2010   CESAREAN SECTION  1992   CHOLECYSTECTOMY  2008   COLONOSCOPY     DILATION AND CURETTAGE OF UTERUS  2011   ESOPHAGOGASTRODUODENOSCOPY     LUMBAR LAMINECTOMY/DECOMPRESSION MICRODISCECTOMY  10/24/2012   Procedure: LUMBAR LAMINECTOMY/DECOMPRESSION MICRODISCECTOMY 1 LEVEL;  Surgeon: Kristeen Miss, MD;  Location: [facility];  Service: Neurosurgery;  Laterality: Right;  Right Lumbar five-Sacral One Microdiskectomy   right knee arthroscopy     x 3   TONSILLECTOMY     at age 33    Family Psychiatric History: Reviewed.  Family History:  Family History  Problem Relation Age of Onset   Arthritis Mother    Heart attack Mother  Depression Mother    Allergies Mother    Hypertension Mother    Heart attack Father     Social History:  Social History   Socioeconomic History   Marital status: Married    Spouse name: Not on file   Number of children: Not on file   Years of education: Not on file   Highest education level: Not on file  Occupational History   Not on file  Tobacco Use   Smoking status: Former   Smokeless tobacco: Never   Tobacco comments:    quit at age 49  Vaping Use   Vaping Use: Never used  Substance and Sexual Activity   Alcohol use: No    Comment: 2-3 times a yr   Drug use: No   Sexual activity: Yes    Birth control/protection: Post-menopausal  Other Topics Concern   Not on file  Social History Narrative   Not on file   Social Determinants of Health   Financial Resource Strain:  Not on file  Food Insecurity: Not on file  Transportation Needs: Not on file  Physical Activity: Not on file  Stress: Not on file  Social Connections: Not on file    Allergies:  Allergies  Allergen Reactions   Demerol [Meperidine] Other (See Comments)    REACTION: unknown--itching   Epinephrine Other (See Comments)    "Sensitivity."   Niacin And Related Rash    Metabolic Disorder Labs: Lab Results  Component Value Date   HGBA1C 5.7 (H) 09/30/2022   MPG 117 09/30/2022   MPG 114 09/30/2021   No results found for: "PROLACTIN" Lab Results  Component Value Date   CHOL 134 09/30/2022   TRIG 150 (H) 09/30/2022   HDL 41 (L) 09/30/2022   CHOLHDL 3.3 09/30/2022   VLDL 31 (H) 06/06/2017   LDLCALC 70 09/30/2022   LDLCALC 98 01/06/2022   Lab Results  Component Value Date   TSH 1.87 09/08/2022   TSH 1.96 01/06/2022    Therapeutic Level Labs: No results found for: "LITHIUM" No results found for: "VALPROATE" No results found for: "CBMZ"  Current Medications: Current Outpatient Medications  Medication Sig Dispense Refill   albuterol (PROAIR HFA) 108 (90 Base) MCG/ACT inhaler Inhale 2 puffs into the lungs every 6 (six) hours as needed for wheezing or shortness of breath. 8 g 1   amitriptyline (ELAVIL) 25 MG tablet Take 1 tablet (25 mg total) by mouth at bedtime. 90 tablet 0   Cholecalciferol (VITAMIN D PO) Take 4,000 Units by mouth daily.      CINNAMON PO Take 1,000 Units by mouth 2 (two) times daily.      clonazePAM (KLONOPIN) 0.5 MG tablet Take 1/2 to 1 tablet as needed for severe anxiety. 30 tablet 1   Cyanocobalamin (B-12 PO) Take by mouth.     Cyanocobalamin (B-12) 2500 MCG TABS Take by mouth.     diclofenac sodium (VOLTAREN) 1 % GEL Apply 4 g topically 4 (four) times daily. 100 g 3   DULoxetine (CYMBALTA) 20 MG capsule Take 2 capsules (40 mg total) by mouth daily. 180 capsule 0   fluticasone (FLONASE) 50 MCG/ACT nasal spray SHAKE LIQUID AND USE 2 SPRAYS IN EACH NOSTRIL  EVERY EVENING 48 g 3   fluticasone-salmeterol (ADVAIR HFA) 115-21 MCG/ACT inhaler Use  2 inhalations  15 minutes apart  2 x /day (every 12 hours) 36 g 3   lamoTRIgine (LAMICTAL) 25 MG tablet Take 1 tablet (25 mg total) by mouth 2 (two) times  daily. 180 tablet 0   levothyroxine (SYNTHROID) 25 MCG tablet Take  1 tablet  Daily  on an empty stomach with only water for 30 minutes & no Antacid meds, Calcium or Magnesium for 4 hours & avoid Biotin  (  Dose change from 50 to 25 mcg ) 90 tablet 1   losartan (COZAAR) 50 MG tablet Take  1 tablet  Daily  for BP                                                                               /                  TAKE BY MOUTH 90 tablet 3   Magnesium 250 MG TABS Take 3 tablets by mouth daily.      meloxicam (MOBIC) 15 MG tablet Take  1 tablet Daily with Food for Pain & Inflammation                                            /                      TAKE                   BY                      MOUTH 90 tablet 3   montelukast (SINGULAIR) 10 MG tablet TAKE 1 TABLET BY MOUTH DAILY FOR ALLERGIES 90 tablet 3   nystatin (MYCOSTATIN) 100000 UNIT/ML suspension SWISH AND SWALLOW 5 ML BY MOUTH FOUR TIMES DAILY FOR THRUSH OR YEAST INFECTION 480 mL 1   pantoprazole (PROTONIX) 40 MG tablet TAKE 1 TABLET BY MOUTH DAILY 90 tablet 3   PREMARIN vaginal cream INSERT 1/2 GRAM VAGINALLY VIA APPLICATOR NIGHTLY FOR W EEKS THEN JUST DO TEICE A WEEK  11   pseudoephedrine (SUDAFED) 120 MG 12 hr tablet Take 1 tablet (120 mg total) by mouth 2 (two) times daily as needed for congestion. 20 tablet 2   rosuvastatin (CRESTOR) 40 MG tablet Take  1 tablet  2 x /week  for Cholesterol                                                      /                                 TAKE                  BY                MOUTH 26 tablet 3   Spacer/Aero-Holding Chambers (AEROCHAMBER MV) inhaler Use as instructed with inhaler. 1 each 0   No current facility-administered medications for this visit.    Screenings: GAD-7  Flowsheet Row Counselor from 05/18/2022 in [facility] Counselor from 05/07/2021 in [place]  Total GAD-7 Score 5 8      PHQ2-9    [place] Office Visit from 09/30/2022 in [facility] Counselor from 05/18/2022 in [place] Office Visit from 01/06/2022 in [facility] [place] [facility] Visit from 09/30/2021 in [facility] Counselor from 05/07/2021 in [place]  PHQ-2 Total Score 0 2 1 0 4  PHQ-9 Total Score -- 6 -- -- 8      Flowsheet Row Counselor from 05/18/2022 in [facility] Counselor from 05/07/2021 in [facility] Video Visit from 05/05/2021 in [facility]  C-SSRS RISK CATEGORY No Risk No Risk No Risk       Psychiatric Specialty Exam: Physical Exam  Review of Systems  Blood pressure (!) 154/94, weight 105 lb (47.6 kg).There is no height or weight on file to calculate BMI.  General Appearance: Casual  Eye Contact:  Good  Speech:  Normal Rate  Volume:  Normal  Mood:  Anxious  Affect:  Appropriate  Thought Process:  Goal Directed  Orientation:  Full (Time, Place, and Person)  Thought Content:  WDL  Suicidal Thoughts:  No  Homicidal Thoughts:  No  Memory:  Immediate;   Good Recent;   Good Remote;   Good  Judgement:  Good  Insight:  Good  Psychomotor Activity:  Normal  Concentration:  Concentration: Good and Attention Span: Good  Recall:  Good  Fund of Knowledge:  Good  Language:  Good  Akathisia:  No  Handed:  Right  AIMS (if indicated):     Assets:  Communication Skills Desire for Improvement Housing Resilience Transportation  ADL's:  Intact  Cognition:  WNL  Sleep:   7 hrs         Assessment and Plan:  Major depressive disorder, recurrent.  Anxiety.  Discussed current medication.  Patient forgot to take higher dose of amitriptyline.  She is afraid it would be too strong but agreed to try 1-1/2 amitriptyline to help her residual anxiety and sleep.  If she tolerated then she will asked to get the adjusted dose refill.  For now she will continue amitriptyline 25 mg prescription, Lamictal 25 mg 2 times a day, Cymbalta 20 mg 2 times a day and Klonopin one fourth as needed for anxiety.  Recommended to call us back if she has any question or any concern.  Follow-up in 3 months.   Collaboration of Care: Collaboration of Care: Primary Care Provider AEB notes are available in epic to review.  Patient/Guardian was advised Release of Information must be obtained prior to any record release in order to collaborate their care with an outside provider. Patient/Guardian was advised if they have not already done so to contact the registration department to sign all necessary forms in order for [place] to release information regarding their care.   Consent: Patient/Guardian gives verbal consent for treatment and assignment of benefits for services provided during this visit. Patient/Guardian expressed understanding and agreed to proceed.    Kathlee Nations, MD 11/11/2022, 1:16 PM

## 2022-12-07 ENCOUNTER — Other Ambulatory Visit: Payer: Self-pay | Admitting: Internal Medicine

## 2022-12-07 DIAGNOSIS — I10 Essential (primary) hypertension: Secondary | ICD-10-CM

## 2022-12-07 MED ORDER — LOSARTAN POTASSIUM 50 MG PO TABS: ORAL_TABLET | ORAL | 3 refills | Status: DC

## 2022-12-08 ENCOUNTER — Ambulatory Visit (HOSPITAL_COMMUNITY): Payer: PPO | Admitting: Licensed Clinical Social Worker

## 2022-12-08 DIAGNOSIS — F331 Major depressive disorder, recurrent, moderate: Secondary | ICD-10-CM | POA: Diagnosis not present

## 2022-12-08 DIAGNOSIS — F411 Generalized anxiety disorder: Secondary | ICD-10-CM

## 2022-12-08 NOTE — Progress Notes (Signed)
THERAPIST PROGRESS NOTE   Session Time: 1:00pm - 2:00pm      Location: Patient: [facility] [place] [facility] Provider: [facility] [place] [facility]     Participation Level: Active    Behavioral Response: Alert, casually dressed, depressed mood/affect   Type of Therapy:  Individual Therapy   Treatment Goals addressed: Depression and anxiety management; Medication compliance  Progress Towards Goals: Ongoing   Interventions: CBT: challenging anxious thoughts    Summary: Ndea Kilroy is a 67 year old married Caucasian female on disability that presented for therapy appointment today with diagnoses of Major depressive disorder, recurrent, moderate; and Generalized Anxiety Disorder.        Suicidal/Homicidal: None; without intent or plan.     Therapist Response:  Clinician met with Darielle for in person therapy session today, and assessed for safety, medication compliance, and sobriety.  Zariah presented for appointment on time, and was alert, oriented x5, with no evidence or self-report of active SI/HI or A/V H.  Deaundra reported ongoing compliance with medication and denied use of alcohol or illicit substances. Clinician inquired about Oakley's emotional ratings today, as well as any significant changes in thoughts, feelings, or behavior since previous check-in.  Clovis reported scores of 7/10 for depression, 4/10 for anxiety, and 7/10 for anger/irritability.  She denied any recent outbursts.  Etola reported that a recent struggle has been feeling overwhelmed with stressors, as her daughter has asked her to plan a baby shower, and her husband has suggested a trip to [place], [place] to visit his family soon.  Basil stated "I think right now I just have too much going on for me to handle.  I had a panic attack this morning just thinking about coming out here".  Clinician utilized handout in session today titled "Worry exploration" in order to assist Dezirae in reducing her anxiety related to upcoming trip and party.  This worksheet  featured a series of Socratic questions aimed at exploring the most likely outcomes for a situation of concern, rather than focusing on the worst possible outcome (i.e. catastrophizing).  Clinician assisted Rainbow in identifying and challenging any irrational beliefs related to this worry, in addition to utilizing problem solving approach to explore strategies which would help her accomplish goal of planning a successful party, and going on her trip without issue.  Intervention effectiveness was mixed, as Samirah participated in discussion, but found it difficult to focus on one particular area due to increasing anxiety as stressors were explored.  Ellisyn reported that she needs help from her support network to handle these challenges, but remains hesitant to open up about her feelings to them due to worry of pushing them away.  She became tearful towards the end of session and group therapy was suggested as a potential way to expand support network, but she declined this, stating "I'm not ready for another commitment right now". Clinician will continue to monitor.              Plan: Follow up for next session in 1 month.   Diagnosis: Major depressive disorder, recurrent, moderate; and Generalized Anxiety Disorder.  Collaboration of Care:   No collaboration of care required for this visit.                                                   Patient/Guardian was advised Release of  Information must be obtained prior to any record release in order to collaborate their care with an outside provider. Patient/Guardian was advised if they have not already done so to contact the registration department to sign all necessary forms in order for [place] to release information regarding their care.    Consent: Patient/Guardian gives verbal consent for treatment and assignment of benefits for services provided during this visit. Patient/Guardian expressed understanding and agreed to proceed.   Shade Flood, , LCAS 12/08/22

## 2022-12-14 ENCOUNTER — Other Ambulatory Visit: Payer: Self-pay | Admitting: Internal Medicine

## 2022-12-14 DIAGNOSIS — J302 Other seasonal allergic rhinitis: Secondary | ICD-10-CM

## 2022-12-14 MED ORDER — MONTELUKAST SODIUM 10 MG PO TABS: ORAL_TABLET | ORAL | 3 refills | Status: DC

## 2022-12-16 ENCOUNTER — Ambulatory Visit (INDEPENDENT_AMBULATORY_CARE_PROVIDER_SITE_OTHER): Payer: PPO | Admitting: Nurse Practitioner

## 2022-12-16 ENCOUNTER — Other Ambulatory Visit: Payer: Self-pay

## 2022-12-16 VITALS — BP 136/64 | HR 79 | Temp 97.3°F | Ht 61.5 in | Wt 106.0 lb

## 2022-12-16 DIAGNOSIS — R051 Acute cough: Secondary | ICD-10-CM

## 2022-12-16 DIAGNOSIS — R0981 Nasal congestion: Secondary | ICD-10-CM | POA: Diagnosis not present

## 2022-12-16 DIAGNOSIS — J00 Acute nasopharyngitis [common cold]: Secondary | ICD-10-CM | POA: Diagnosis not present

## 2022-12-16 DIAGNOSIS — Z1152 Encounter for screening for COVID-19: Secondary | ICD-10-CM | POA: Diagnosis not present

## 2022-12-16 DIAGNOSIS — R6889 Other general symptoms and signs: Secondary | ICD-10-CM

## 2022-12-16 DIAGNOSIS — J302 Other seasonal allergic rhinitis: Secondary | ICD-10-CM

## 2022-12-16 DIAGNOSIS — J4520 Mild intermittent asthma, uncomplicated: Secondary | ICD-10-CM

## 2022-12-16 LAB — POCT INFLUENZA A/B
Influenza A, POC: NEGATIVE
Influenza B, POC: NEGATIVE

## 2022-12-16 LAB — POC COVID19 BINAXNOW: SARS Coronavirus 2 Ag: NEGATIVE

## 2022-12-16 MED ORDER — PROMETHAZINE-DM 6.25-15 MG/5ML PO SYRP: 2.5000 mL | ORAL_SOLUTION | Freq: Four times a day (QID) | ORAL | 0 refills | Status: DC | PRN

## 2022-12-16 MED ORDER — MONTELUKAST SODIUM 10 MG PO TABS: ORAL_TABLET | ORAL | 3 refills | Status: DC

## 2022-12-16 MED ORDER — AZITHROMYCIN 500 MG PO TABS: 500.0000 mg | ORAL_TABLET | Freq: Every day | ORAL | 0 refills | Status: AC

## 2022-12-16 MED ORDER — PREDNISONE 20 MG PO TABS: ORAL_TABLET | ORAL | 0 refills | Status: DC

## 2022-12-16 NOTE — Progress Notes (Signed)
Assessment and Plan:  Sarah Rodriguez was seen today for an episodic visit.  Diagnoses and all order for this visit:  1. Flu-like symptoms Negative  - POCT Influenza A/B  2. Encounter for screening for COVID-19 Negative  - POC COVID-19  3. Upper respiratory tract infection Likely bronchitis, however, have sent Azithromycin if patient would like to start over weekend if s/s fail to improve. Start Prednisone as directed  - predniSONE (DELTASONE) 20 MG tablet; Take one pill two times daily for 3 days, take one pill daily for 4 days.  Dispense: 10 tablet; Refill: 0 - azithromycin (ZITHROMAX) 500 MG tablet; Take 1 tablet (500 mg total) by mouth daily for 10 days.  Dispense: 10 tablet; Refill: 0  4. Acute cough/Asthma Continue Albuterol and Advair as directed. Stay well hydrated to keep any mucus thin an d productive. Coughing can be cuased by several factors including   breathing in things that bother (irritate) your lungs.  Allergies.  Asthma.  Mucus that runs down the back of your throat (postnasal drip).  Smoking/smoke.  Acid backing up from the stomach into the tube that moves food from the mouth to the stomach (gastroesophageal reflux). A cough can linger for 3 weeks. Watch for any changes in your cough and contact office if noticed including blood, pus, pain, night sweats. Cover your mouth when you cough. If the air is dry, use a cool mist vaporizer or humidifier in your home. If your cough is worse at night, try using extra pillows to raise your head up higher while you sleep. Call 911 or report to ER if you start to have difficulty breathing.   - promethazine-dextromethorphan (PROMETHAZINE-DM) 6.25-15 MG/5ML syrup; Take 2.5 mLs by mouth 4 (four) times daily as needed for cough.  Dispense: 240 mL; Refill: 0  5. Nasal congestion Stay well hydrated to keep mucus thin and productive. Avoid triggers that increase you symptoms such as perfumes, allergies. May try OTC nasal  irrigation or OTC antihistamine. Do not take NSAIDs such as Ibuprofen or medications that contain ASA if they make your symptoms worse.  Sleep with HOB raised.  6. Seasonal allergies Continue medications as directed. Avoid triggers.  - montelukast (SINGULAIR) 10 MG tablet; Take 1 tablet Daily for Allergies                                                                              /                                                                          TAKE                                        BY  MOUTH  Dispense: 90 tablet; Refill: 3   Notify office for further evaluation and treatment, questions or concerns if s/s fail to improve. The risks and benefits of my recommendations, as well as other treatment options were discussed with the patient today. Questions were answered.  Further disposition pending results of labs. Discussed med's effects and SE's.    Over 15 minutes of exam, counseling, chart review, and critical decision making was performed.   Future Appointments  Date Time Provider [place]  01/06/2023 11:00 AM Darrol Jump, NP [facility] None  02/10/2023  2:20 PM Arfeen, Arlyce Harman, MD [facility] None  04/11/2023  2:30 PM Unk Pinto, MD [facility] None  10/06/2023  3:00 PM Unk Pinto, MD [facility] None    ------------------------------------------------------------------------------------------------------------------   HPI BP 136/64   Pulse 79   Temp (!) 97.3 F (36.3 C)   Ht 5' 1.5" (1.562 m)   Wt 106 lb (48.1 kg)   SpO2 99%   BMI 19.70 kg/m    Patient presents for evaluation of nasal congestion, productive cough with sputum described as tan and brown, rhinorrhea clear, sneezing, and sore throat, fatigue. Symptoms began 4 days ago and are gradually worsening since that time. Past history is significant for asthma.  Currently using Advair and Ventolin.  Denies wheezing or SOB.  Also taking Zyrtec, Singulair, Tussin  DM and Tylenol.    Past Medical History:  Diagnosis Date   Asthma    Dulera daily   Chronic back pain    HNP   Complication of anesthesia    slow to wake up   Depression    takes Clonazepam nightly   GERD (gastroesophageal reflux disease)    takes Protonix daily    Heart murmur    History of migraine    takes Relpax daily as needed   HSV (herpes simplex virus) infection    Hyperlipidemia    takes Atorvastatin daily   Hypertension    takes Benicar daily   Hypothyroidism    takes Synthroid daily   MVP (mitral valve prolapse)    Pneumonia    hx of--been many yrs ago   Seasonal allergies    takes Allegra daily   Shortness of breath    with exertion     Allergies  Allergen Reactions   Demerol [Meperidine] Other (See Comments)    REACTION: unknown--itching   Epinephrine Other (See Comments)    "Sensitivity."   Niacin And Related Rash    Current Outpatient Medications on File Prior to Visit  Medication Sig   albuterol (PROAIR HFA) 108 (90 Base) MCG/ACT inhaler Inhale 2 puffs into the lungs every 6 (six) hours as needed for wheezing or shortness of breath.   amitriptyline (ELAVIL) 25 MG tablet Take 1 tablet (25 mg total) by mouth at bedtime.   Cholecalciferol (VITAMIN D PO) Take 4,000 Units by mouth daily.    CINNAMON PO Take 1,000 Units by mouth 2 (two) times daily.    clonazePAM (KLONOPIN) 0.5 MG tablet Take 1/2 to 1 tablet as needed for severe anxiety.   Cyanocobalamin (B-12 PO) Take by mouth.   Cyanocobalamin (B-12) 2500 MCG TABS Take by mouth.   diclofenac sodium (VOLTAREN) 1 % GEL Apply 4 g topically 4 (four) times daily.   DULoxetine (CYMBALTA) 20 MG capsule Take 2 capsules (40 mg total) by mouth daily.   fluticasone (FLONASE) 50 MCG/ACT nasal spray SHAKE LIQUID AND USE 2 SPRAYS IN EACH NOSTRIL EVERY EVENING   fluticasone-salmeterol (ADVAIR HFA) 115-21 MCG/ACT  inhaler Use  2 inhalations  15 minutes apart  2 x /day (every 12 hours)   lamoTRIgine (LAMICTAL) 25 MG  tablet Take 1 tablet (25 mg total) by mouth 2 (two) times daily.   levothyroxine (SYNTHROID) 25 MCG tablet Take  1 tablet  Daily  on an empty stomach with only water for 30 minutes & no Antacid meds, Calcium or Magnesium for 4 hours & avoid Biotin  (  Dose change from 50 to 25 mcg )   losartan (COZAAR) 50 MG tablet Take  1 tablet  Daily  for BP                                                                               /                  TAKE BY MOUTH   Magnesium 250 MG TABS Take 3 tablets by mouth daily.    meloxicam (MOBIC) 15 MG tablet Take  1 tablet Daily with Food for Pain & Inflammation                                            /                      TAKE                   BY                      MOUTH   montelukast (SINGULAIR) 10 MG tablet Take 1 tablet Daily for Allergies                                                                              /                                                                          TAKE                                        BY                                  MOUTH   nystatin (MYCOSTATIN) 100000 UNIT/ML suspension SWISH AND SWALLOW 5 ML BY MOUTH FOUR TIMES DAILY FOR THRUSH OR YEAST INFECTION  pantoprazole (PROTONIX) 40 MG tablet TAKE 1 TABLET BY MOUTH DAILY   PREMARIN vaginal cream INSERT 1/2 GRAM VAGINALLY VIA APPLICATOR NIGHTLY FOR W EEKS THEN JUST DO TEICE A WEEK   rosuvastatin (CRESTOR) 40 MG tablet Take  1 tablet  2 x /week  for Cholesterol                                                      /                                 TAKE                  BY                MOUTH   Spacer/Aero-Holding Chambers (AEROCHAMBER MV) inhaler Use as instructed with inhaler.   No current facility-administered medications on file prior to visit.    ROS: all negative except what is noted in the HPI.   Physical Exam:  BP 136/64   Pulse 79   Temp (!) 97.3 F (36.3 C)   Ht 5' 1.5" (1.562 m)   Wt 106 lb (48.1 kg)   SpO2 99%   BMI 19.70 kg/m   General  Appearance: NAD.  Awake, conversant and cooperative. Eyes: PERRLA, EOMs intact.  Sclera white.  Conjunctiva without erythema. Sinuses: Frontal/maxillary tenderness.  No nasal discharge. Nares not patent.  ENT/Mouth: Ext aud canals clear.  Bilateral TMs w/DOL and without erythema or bulging. Hearing intact.  Posterior pharynx without swelling or exudate.  Tonsils without swelling or erythema.  Neck: Supple.  No masses, nodules or thyromegaly. Respiratory: Effort is regular with non-labored breathing. Breath sounds are equal bilaterally without rales, rhonchi, wheezing or stridor.  Cardio: RRR with no MRGs. Brisk peripheral pulses without edema.  Abdomen: Active BS in all four quadrants.  Soft and non-tender without guarding, rebound tenderness, hernias or masses. Lymphatics: Non tender without lymphadenopathy.  Musculoskeletal: Full ROM, 5/5 strength, normal ambulation.  No clubbing or cyanosis. Skin: Appropriate color for ethnicity. Warm without rashes, lesions, ecchymosis, ulcers.  Neuro: CN II-XII grossly normal. Normal muscle tone without cerebellar symptoms and intact sensation.   Psych: AO X 3,  appropriate mood and affect, insight and judgment.     Darrol Jump, NP 10:42 AM [facility]

## 2022-12-16 NOTE — Patient Instructions (Addendum)
  Acute Bronchitis, Adult  Acute bronchitis is when air tubes in the lungs (bronchi) suddenly get swollen. The condition can make it hard for you to breathe. In adults, acute bronchitis usually goes away within 2 weeks. A cough caused by bronchitis may last up to 3 weeks. Smoking, allergies, and asthma can make the condition worse. What are the causes? Germs that cause cold and flu (viruses). The most common cause of this condition is the virus that causes the common cold. Bacteria. Substances that bother (irritate) the lungs, including: Smoke from cigarettes and other types of tobacco. Dust and pollen. Fumes from chemicals, gases, or burned fuel. Indoor or outdoor air pollution. What increases the risk? A weak body's defense system. This is also called the immune system. Any condition that affects your lungs and breathing, such as asthma. What are the signs or symptoms? A cough. Coughing up clear, yellow, or green mucus. Making high-pitched whistling sounds when you breathe, most often when you breathe out (wheezing). Runny or stuffy nose. Having too much mucus in your lungs (chest congestion). Shortness of breath. Body aches. A sore throat. How is this treated? Acute bronchitis may go away over time without treatment. Your doctor may tell you to: Drink more fluids. This will help thin your mucus so it is easier to cough up. Use a device that gets medicine into your lungs (inhaler). Use a vaporizer or a humidifier. These are machines that add water to the air. This helps with coughing and poor breathing. Take a medicine that thins mucus and helps clear it from your lungs. Take a medicine that prevents or stops coughing. It is not common to take an antibiotic medicine for this condition. Follow these instructions at home:  Take over-the-counter and prescription medicines only as told by your doctor. Use an inhaler, vaporizer, or humidifier as told by your doctor. Take two  teaspoons (10 mL) of honey at bedtime. This helps lessen your coughing at night. Drink enough fluid to keep your pee (urine) pale yellow. Do not smoke or use any products that contain nicotine or tobacco. If you need help quitting, ask your doctor. Get a lot of rest. Return to your normal activities when your doctor says that it is safe. Keep all follow-up visits. How is this prevented?  Wash your hands often with soap and water for at least 20 seconds. If you cannot use soap and water, use hand sanitizer. Avoid contact with people who have cold symptoms. Try not to touch your mouth, nose, or eyes with your hands. Avoid breathing in smoke or chemical fumes. Make sure to get the flu shot every year. Contact a doctor if: Your symptoms do not get better in 2 weeks. You have trouble coughing up the mucus. Your cough keeps you awake at night. You have a fever. Get help right away if: You cough up blood. You have chest pain. You have very bad shortness of breath. You faint or keep feeling like you are going to faint. You have a very bad headache. Your fever or chills get worse. These symptoms may be an emergency. Get help right away. Call your local emergency services (911 in the [place] Do not wait to see if the symptoms will go away. Do not drive yourself to the hospital. Summary Acute bronchitis is when air tubes in the lungs (bronchi) suddenly get swollen. In adults, acute bronchitis usually goes away within 2 weeks. Drink more fluids. This will help thin your mucus so it   is easier to cough up. Take over-the-counter and prescription medicines only as told by your doctor. Contact a doctor if your symptoms do not improve after 2 weeks of treatment. This information is not intended to replace advice given to you by your health care provider. Make sure you discuss any questions you have with your health care provider. Document Revised: 03/18/2021 Document Reviewed: 03/18/2021 [employer]  Patient Education  2023 [employer]  

## 2022-12-20 ENCOUNTER — Other Ambulatory Visit: Payer: Self-pay

## 2022-12-20 DIAGNOSIS — E782 Mixed hyperlipidemia: Secondary | ICD-10-CM

## 2022-12-20 MED ORDER — ROSUVASTATIN CALCIUM 40 MG PO TABS: ORAL_TABLET | ORAL | 3 refills | Status: DC

## 2023-01-05 NOTE — Progress Notes (Signed)
[facility]    Annual Medicare Wellness Visit Due annually  Health maintenance reviewed Check about shingrix  Migraine with aura and without status migrainosus, not intractable Well managed by PRN OTC analgesic and phenergan  patient to go to ER if there is weakness, thunderclap headache, visual changes, or any concerning factors  Essential hypertension Monitor blood pressure at home; call if consistently over 130/80 Continue DASH diet.   Reminder to go to the ER if any CP, SOB, nausea, dizziness, severe HA, changes vision/speech, left arm numbness and tingling and jaw pain.  Uncomplicated asthma, unspecified asthma severity, unspecified whether persistent Well controlled on inhalers without recent flare  Gastroesophageal reflux disease, esophagitis presence not specified Fairly managed on current medications; PPI taper attempt failed Discussed diet, avoiding triggers and other lifestyle changes  Hypothyroidism, unspecified typ continue medications the same pending lab results reminded to take on an empty stomach 30-34mns before food.  check TSH level  Spondylolisthesis at L4-L5 level S/p fusion for repair Followed by Dr. EOsborne Oman Primary osteoarthritis involving multiple joints Well managed by motrin/tylenol/lyrica  Vitamin D deficiency At goal at recent check; continue to recommend supplementation Defer vitamin D level  Other abnormal glucose (hx of prediabetes) Discussed disease and risks Discussed diet/exercise, weight management  Check A1C [id number] today; monitor serum glucose   Medication management CBC, CMP/GFR  Hyperlipidemia, mixed Continue medications: atorvastatin Continue low cholesterol diet and exercise.  Check lipid panel.   Depression, major, recurrent, in partial remission [place] Ar[place]dele Schilderontinue medications, reminded to limit benzo use to <5 days a week if possible to avoid dependence   Lifestyle discussed: diet/exerise, sleep hygiene, stress management, hydration  Chronic back pain, unspecified back location, unspecified back pain laterality Continue follow up with Dr. ElLenn Calyrica for back pain  Sleep apnea Continue oral devide  BMI 21 Continue to recommend diet heavy in fruits and veggies and low in animal meats, cheeses, and dairy products, appropriate calorie intake Discuss exercise recommendations routinely Continue to monitor weight at each visit   No orders of the defined types were placed in this encounter.     Over 40 minutes of exam, counseling, chart review and critical decision making was performed Future Appointments  Date Time Provider De[place]2/06/2023 11:00 AM CrDarrol JumpNP [facility] None  02/10/2023  2:20 PM Arfeen, SyArlyce HarmanMD [facility] None  04/11/2023  2:30 PM McUnk PintoMD [facility] None  10/06/2023  3:00 PM McUnk PintoMD [facility] None  01/09/2024 11:00 AM CrDarrol JumpNP [facility] None     Plan:   During the course of the visit the patient was educated and counseled about appropriate screening and preventive services including:   Pneumococcal vaccine  Prevnar 13 Influenza vaccine Td vaccine Screening electrocardiogram Bone densitometry screening Colorectal cancer screening Diabetes screening Glaucoma screening Nutrition counseling  Advanced directives: requested   Subjective:  Sarah Rodriguez a [age].o. female who presents for Medicare Annual Wellness Visit and 3 month follow up. Patient has GERD controlled on her meds.   She is recovering from sinusitis, reports has persistent R ear pressure/pain without fever/chills or ear discharge. She denies changes in hearing, has hearing aids.   She was referred to Dr. SaAugustina Moodho found a mild sleep apnea and was recommended an oral device and husband has noted improved snoring.   She also has Chronic Pain Syndrome consequent of Cx  DDD and Cx HNP surg  in 2011 and then in 2013, she underwent L5/S1 Surg by Dr Ellene Route and lastly in Nov 2016, she had a Lumbar fusion. She is able to control her pain on extra strength tylenol, has lyrica with limited benefit, avoided opioids.  She has hx of migraines; reports rare, typically more sinus. Typically does well with 1/2 tab excedrine. She does have nurtec samples but hasn't needed to use.    She has history of asthma/allergies, states consistently has cough. She has had some sinus congestion/left side worse. Has seen ENT, flonase recommended.    she has a diagnosis of depression, follows with Dr. Adele Schilder currently fairly controlled on duloxetine, lamictal and amitryptiline, and currently takes klonopin 0.25-0.5 mg BID PRN anxiety and insomnia, reports symptoms are well controlled on current regimen. she takes 0.125 mg of klonopin at night for sleep, uses rarely during the day for anxiety.   Hx of GERD on protonix 40 mg daily, failed taper by GI.   BMI is There is no height or weight on file to calculate BMI., she admits needs to do better on diet and exercise. Not exercising as much with cold weather, tries to go up and down strairs more often in home. Wt Readings from Last 3 Encounters:  12/16/22 106 lb (48.1 kg)  09/30/22 106 lb 12.8 oz (48.4 kg)  09/08/22 107 lb 3.2 oz (48.6 kg)   . Her blood pressure has been controlled at home, today their BP is   She does workout. She denies chest pain, shortness of breath, dizziness.   She is on cholesterol medication (rosuvastatin 40 mg Tues, Thurs, was having cramping with higher doses) and denies myalgias. Her cholesterol is not at goal. The cholesterol last visit was:   Lab Results  Component Value Date   CHOL 134 09/30/2022   HDL 41 (L) 09/30/2022   LDLCALC 70 09/30/2022   TRIG 150 (H) 09/30/2022   CHOLHDL 3.3 09/30/2022    She has been working on diet and exercise for prediabetes, and denies foot ulcerations, increased appetite,  nausea, polydipsia, polyuria, visual disturbances, vomiting and weight loss. Last A1C in the office was:  Lab Results  Component Value Date   HGBA1C 5.7 (H) 09/30/2022   She is on thyroid medication. Her medication was not changed last visit. Taking 1/2 tab 50 mcg daily.  Lab Results  Component Value Date   TSH 1.87 09/08/2022   Last GFR: Lab Results  Component Value Date   GFRNONAA 67 04/16/2021   Patient is on Vitamin D supplement, taking 4000 IU daily, did reduce after last visit Lab Results  Component Value Date   VD25OH 89 09/08/2022       Medication Review: Current Outpatient Medications on File Prior to Visit  Medication Sig Dispense Refill  . albuterol (PROAIR HFA) 108 (90 Base) MCG/ACT inhaler Inhale 2 puffs into the lungs every 6 (six) hours as needed for wheezing or shortness of breath. 8 g 1  . amitriptyline (ELAVIL) 25 MG tablet Take 1 tablet (25 mg total) by mouth at bedtime. 90 tablet 0  . Cholecalciferol (VITAMIN D PO) Take 4,000 Units by mouth daily.     Marland Kitchen CINNAMON PO Take 1,000 Units by mouth 2 (two) times daily.     . clonazePAM (KLONOPIN) 0.5 MG tablet Take 1/2 to 1 tablet as needed for severe anxiety. 30 tablet 1  . Cyanocobalamin (B-12 PO) Take by mouth.    . Cyanocobalamin (B-12) 2500 MCG TABS Take by mouth.    Marland Kitchen  diclofenac sodium (VOLTAREN) 1 % GEL Apply 4 g topically 4 (four) times daily. 100 g 3  . DULoxetine (CYMBALTA) 20 MG capsule Take 2 capsules (40 mg total) by mouth daily. 180 capsule 0  . fluticasone (FLONASE) 50 MCG/ACT nasal spray SHAKE LIQUID AND USE 2 SPRAYS IN EACH NOSTRIL EVERY EVENING 48 g 3  . fluticasone-salmeterol (ADVAIR HFA) 115-21 MCG/ACT inhaler Use  2 inhalations  15 minutes apart  2 x /day (every 12 hours) 36 g 3  . lamoTRIgine (LAMICTAL) 25 MG tablet Take 1 tablet (25 mg total) by mouth 2 (two) times daily. 180 tablet 0  . levothyroxine (SYNTHROID) 25 MCG tablet Take  1 tablet  Daily  on an empty stomach with only water for 30  minutes & no Antacid meds, Calcium or Magnesium for 4 hours & avoid Biotin  (  Dose change from 50 to 25 mcg ) 90 tablet 1  . losartan (COZAAR) 50 MG tablet Take  1 tablet  Daily  for BP                                                                               /                  TAKE BY MOUTH 90 tablet 3  . Magnesium 250 MG TABS Take 3 tablets by mouth daily.     . meloxicam (MOBIC) 15 MG tablet Take  1 tablet Daily with Food for Pain & Inflammation                                            /                      TAKE                   BY                      MOUTH 90 tablet 3  . montelukast (SINGULAIR) 10 MG tablet Take 1 tablet Daily for Allergies                                                                              /                                                                          TAKE  BY                                  MOUTH 90 tablet 3  . nystatin (MYCOSTATIN) 100000 UNIT/ML suspension SWISH AND SWALLOW 5 ML BY MOUTH FOUR TIMES DAILY FOR THRUSH OR YEAST INFECTION 480 mL 1  . pantoprazole (PROTONIX) 40 MG tablet TAKE 1 TABLET BY MOUTH DAILY 90 tablet 3  . predniSONE (DELTASONE) 20 MG tablet Take one pill two times daily for 3 days, take one pill daily for 4 days. 10 tablet 0  . PREMARIN vaginal cream INSERT 1/2 GRAM VAGINALLY VIA APPLICATOR NIGHTLY FOR W EEKS THEN JUST DO TEICE A WEEK  11  . promethazine-dextromethorphan (PROMETHAZINE-DM) 6.25-15 MG/5ML syrup Take 2.5 mLs by mouth 4 (four) times daily as needed for cough. 240 mL 0  . rosuvastatin (CRESTOR) 40 MG tablet Take  1 tablet  2 x /week  for Cholesterol                                                      /                                 TAKE                  BY                MOUTH 26 tablet 3  . Spacer/Aero-Holding Chambers (AEROCHAMBER MV) inhaler Use as instructed with inhaler. 1 each 0   No current facility-administered medications on file prior to visit.    Allergies   Allergen Reactions  . Demerol [Meperidine] Other (See Comments)    REACTION: unknown--itching  . Epinephrine Other (See Comments)    "Sensitivity."  . Niacin And Related Rash    Current Problems (verified) Patient Active Problem List   Diagnosis Date Noted  . Sleep apnea 06/12/2020  . Deviated septum 03/14/2018  . Chronic back pain 12/21/2017  . Depression, major, recurrent, in partial remission ([place]) 08/05/2016  . Degenerative joint disease 08/05/2016  . Migraine 03/03/2016  . Spondylolisthesis at L4-L5 level 10/27/2015  . Vitamin D deficiency 04/17/2014  . Medication management 04/17/2014  . Hyperlipidemia, mixed   . Hypertension   . Asthma   . GERD (gastroesophageal reflux disease)   . Hypothyroidism     Screening Tests Immunization History  Administered Date(s) Administered  . Influenza Inj Mdck Quad Pf 08/27/2019  . Influenza Inj Mdck Quad With Preservative 09/14/2017, 10/19/2018, 09/24/2020  . Influenza, High Dose Seasonal PF 09/08/2022  . Influenza,inj,Quad PF,6+ Mos 10/06/2021  . Influenza,inj,quad, With Preservative 11/09/2016  . Influenza-Unspecified 09/18/2013  . Moderna Sars-Covid-2 Vaccination 02/15/2020, 03/18/2020, 11/10/2020  . PNEUMOCOCCAL CONJUGATE-20 09/08/2022  . PPD Test 04/17/2014  . Pneumococcal-Unspecified 11/30/1995  . Td 08/27/2005, 05/05/2016   Health Maintenance  Topic Date Due  . Zoster Vaccines- Shingrix (1 of 2) Never done  . DEXA SCAN  Never done  . COVID-19 Vaccine (4 - 2023-24 season) 07/30/2022  . Medicare Annual Wellness (AWV)  01/06/2023  . MAMMOGRAM  03/12/2023  . DTaP/Tdap/Td (3 - Tdap) 05/05/2026  . COLONOSCOPY (Pts 45-68yr Insurance coverage will need to be confirmed)  02/26/2031  . Pneumonia  Vaccine 61+ Years old  Completed  . INFLUENZA VACCINE  Completed  . Hepatitis C Screening  Completed  . HIV Screening  Completed  . HPV VACCINES  Aged Out   Last colonoscopy: 2014 Dr. Cristina Gong, polyp, 01/2021 normal, 10 year  recall  Last mammogram: annually at Dr. Valentino Saxon, last 02/2021, has upcoming scheduled 02/2022 Last pap smear/pelvic exam: at GYN, following annually DEXA: GYN is following, plan to start age 89  Shingles/Zostavax: declines due to cost   Names of Other Physician/Practitioners you currently use: 1. [facility]- here for primary care 2. Dr. [place], eye doctor, last visit 2023, wears glasses.  3. Dr. Carlye Grippe, dentist, last visit 2022  Patient Care Team: Unk Pinto, MD as PCP - General (Internal Medicine) Kristeen Miss, MD as Consulting Physician (Neurosurgery) Kathlee Nations, MD as Consulting Physician (Psychiatry)  SURGICAL HISTORY She  has a past surgical history that includes Dilation and curettage of uterus (2011); Ablation (2011); Cholecystectomy (2008); Tonsillectomy; buttocks surgery; Cesarean section (1992); right knee arthroscopy; Cervical fusion (03/2010); Colonoscopy; Esophagogastroduodenoscopy; and Lumbar laminectomy/decompression microdiscectomy (10/24/2012). FAMILY HISTORY Her family history includes Allergies in her mother; Arthritis in her mother; Depression in her mother; Heart attack in her father and mother; Hypertension in her mother. SOCIAL HISTORY She  reports that she has quit smoking. She has never used smokeless tobacco. She reports that she does not drink alcohol and does not use drugs.  MEDICARE WELLNESS OBJECTIVES: Physical activity:   Cardiac risk factors:   Depression/mood screen:      09/30/2022   12:33 AM  Depression screen PHQ 2/9  Decreased Interest 0  Down, Depressed, Hopeless 0  PHQ - 2 Score 0    ADLs:     09/30/2022   12:34 AM 01/06/2022   11:31 AM  In your present state of health, do you have any difficulty performing the following activities:  Hearing? 0 0  Comment  has hearing aids, manages  Vision? 0 0  Difficulty concentrating or making decisions? 0 0  Walking or climbing stairs? 0 0  Dressing  or bathing? 0 0  Doing errands, shopping? 0 0     Cognitive Testing  Alert? Yes  Normal Appearance?Yes  Oriented to person? Yes  Place? Yes   Time? Yes  Recall of three objects?  Yes  Can perform simple calculations? Yes  Displays appropriate judgment?Yes  Can read the correct time from a watch face?Yes  EOL planning:     Review of Systems  Constitutional:  Negative for malaise/fatigue and weight loss.  HENT:  Positive for ear pain (R ear). Negative for congestion, ear discharge, hearing loss, sinus pain, sore throat and tinnitus.   Eyes:  Negative for blurred vision and double vision.  Respiratory:  Negative for cough, sputum production, shortness of breath and wheezing.   Cardiovascular:  Negative for chest pain, palpitations, orthopnea, claudication, leg swelling and PND.  Gastrointestinal:  Negative for abdominal pain, blood in stool, constipation, diarrhea, heartburn, melena, nausea and vomiting.  Genitourinary: Negative.   Musculoskeletal:  Positive for back pain (intermittent, improved). Negative for falls, joint pain and myalgias.  Skin:  Negative for rash.  Neurological:  Negative for dizziness, tingling, sensory change, weakness and headaches.  Endo/Heme/Allergies:  Negative for polydipsia.  Psychiatric/Behavioral:  Positive for depression (chronic, psych following). Negative for memory loss, substance abuse and suicidal ideas. The patient is not nervous/anxious and does not have insomnia.   All other systems reviewed and are negative.  Objective:     There were no vitals filed for this visit.  There is no height or weight on file to calculate BMI.  General appearance: alert, no distress, WD/WN, female HEENT: normocephalic, sclerae anicteric, TMs pearly, R mildly distended without purulence or erythema, nares patent, no discharge or erythema, dry/flaky skin in canals; Good dentition, + R TMJ Neck: supple, no lymphadenopathy, no thyromegaly, no masses Heart: RRR,  normal S1, S2,  no murmur Lungs: CTA bilaterally, no wheezes, rhonchi, or rales Abdomen: +bs, soft, non tender, non distended, no masses, no hepatomegaly, no splenomegaly Musculoskeletal: nontender, no swelling, no obvious deformity Extremities: no edema, no cyanosis, no clubbing Pulses: 2+ symmetric, upper and lower extremities, normal cap refill Neurological: alert, oriented x 3, CN2-12 intact, strength normal upper extremities and lower extremities, sensation normal throughout, DTRs 2+ throughout, no cerebellar signs, gait normal/steady Psychiatric: normal affect, behavior normal, pleasant   Medicare Attestation I have personally reviewed: The patient's medical and social history Their use of alcohol, tobacco or illicit drugs Their current medications and supplements The patient's functional ability including ADLs,fall risks, home safety risks, cognitive, and hearing and visual impairment Diet and physical activities Evidence for depression or mood disorders  The patient's weight, height, BMI, and visual acuity have been recorded in the chart.  I have made referrals, counseling, and provided education to the patient based on review of the above and I have provided the patient with a written personalized care plan for preventive services.     Darrol Jump, NP   01/05/2023

## 2023-01-06 ENCOUNTER — Encounter: Payer: Self-pay | Admitting: Nurse Practitioner

## 2023-01-06 ENCOUNTER — Ambulatory Visit (INDEPENDENT_AMBULATORY_CARE_PROVIDER_SITE_OTHER): Payer: PPO | Admitting: Nurse Practitioner

## 2023-01-06 VITALS — BP 132/74 | HR 70 | Temp 97.9°F | Ht 61.5 in | Wt 106.2 lb

## 2023-01-06 DIAGNOSIS — Z79899 Other long term (current) drug therapy: Secondary | ICD-10-CM | POA: Diagnosis not present

## 2023-01-06 DIAGNOSIS — K219 Gastro-esophageal reflux disease without esophagitis: Secondary | ICD-10-CM | POA: Diagnosis not present

## 2023-01-06 DIAGNOSIS — G4730 Sleep apnea, unspecified: Secondary | ICD-10-CM | POA: Diagnosis not present

## 2023-01-06 DIAGNOSIS — G43109 Migraine with aura, not intractable, without status migrainosus: Secondary | ICD-10-CM

## 2023-01-06 DIAGNOSIS — I10 Essential (primary) hypertension: Secondary | ICD-10-CM

## 2023-01-06 DIAGNOSIS — F3341 Major depressive disorder, recurrent, in partial remission: Secondary | ICD-10-CM

## 2023-01-06 DIAGNOSIS — E559 Vitamin D deficiency, unspecified: Secondary | ICD-10-CM

## 2023-01-06 DIAGNOSIS — R6889 Other general symptoms and signs: Secondary | ICD-10-CM

## 2023-01-06 DIAGNOSIS — Z0001 Encounter for general adult medical examination with abnormal findings: Secondary | ICD-10-CM

## 2023-01-06 DIAGNOSIS — M549 Dorsalgia, unspecified: Secondary | ICD-10-CM | POA: Diagnosis not present

## 2023-01-06 DIAGNOSIS — M4316 Spondylolisthesis, lumbar region: Secondary | ICD-10-CM | POA: Diagnosis not present

## 2023-01-06 DIAGNOSIS — E039 Hypothyroidism, unspecified: Secondary | ICD-10-CM | POA: Diagnosis not present

## 2023-01-06 DIAGNOSIS — Z0000 Encounter for general adult medical examination without abnormal findings: Secondary | ICD-10-CM

## 2023-01-06 DIAGNOSIS — J45909 Unspecified asthma, uncomplicated: Secondary | ICD-10-CM

## 2023-01-06 DIAGNOSIS — R7309 Other abnormal glucose: Secondary | ICD-10-CM

## 2023-01-06 DIAGNOSIS — M150 Primary generalized (osteo)arthritis: Secondary | ICD-10-CM

## 2023-01-06 DIAGNOSIS — G8929 Other chronic pain: Secondary | ICD-10-CM

## 2023-01-06 DIAGNOSIS — M159 Polyosteoarthritis, unspecified: Secondary | ICD-10-CM | POA: Diagnosis not present

## 2023-01-06 DIAGNOSIS — E782 Mixed hyperlipidemia: Secondary | ICD-10-CM

## 2023-01-06 DIAGNOSIS — Z6821 Body mass index (BMI) 21.0-21.9, adult: Secondary | ICD-10-CM

## 2023-01-07 LAB — CBC WITH DIFFERENTIAL/PLATELET
Absolute Monocytes: 454 cells/uL (ref 200–950)
Basophils Absolute: 43 cells/uL (ref 0–200)
Basophils Relative: 0.6 %
Eosinophils Absolute: 137 cells/uL (ref 15–500)
Eosinophils Relative: 1.9 %
HCT: 40.3 % (ref 35.0–45.0)
Hemoglobin: 13.7 g/dL (ref 11.7–15.5)
Lymphs Abs: 1620 cells/uL (ref 850–3900)
MCH: 32.5 pg (ref 27.0–33.0)
MCHC: 34 g/dL (ref 32.0–36.0)
MCV: 95.5 fL (ref 80.0–100.0)
MPV: 10.6 fL (ref 7.5–12.5)
Monocytes Relative: 6.3 %
Neutro Abs: 4946 cells/uL (ref 1500–7800)
Neutrophils Relative %: 68.7 %
Platelets: 235 10*3/uL (ref 140–400)
RBC: 4.22 10*6/uL (ref 3.80–5.10)
RDW: 11.4 % (ref 11.0–15.0)
Total Lymphocyte: 22.5 %
WBC: 7.2 10*3/uL (ref 3.8–10.8)

## 2023-01-07 LAB — T3, FREE: T3, Free: 2.9 pg/mL (ref 2.3–4.2)

## 2023-01-07 LAB — COMPLETE METABOLIC PANEL WITH GFR
AG Ratio: 2.3 (calc) (ref 1.0–2.5)
ALT: 16 U/L (ref 6–29)
AST: 18 U/L (ref 10–35)
Albumin: 4.5 g/dL (ref 3.6–5.1)
Alkaline phosphatase (APISO): 56 U/L (ref 37–153)
BUN: 19 mg/dL (ref 7–25)
CO2: 28 mmol/L (ref 20–32)
Calcium: 9.6 mg/dL (ref 8.6–10.4)
Chloride: 104 mmol/L (ref 98–110)
Creat: 0.95 mg/dL (ref 0.50–1.05)
Globulin: 2 g/dL (calc) (ref 1.9–3.7)
Glucose, Bld: 78 mg/dL (ref 65–99)
Potassium: 4.4 mmol/L (ref 3.5–5.3)
Sodium: 142 mmol/L (ref 135–146)
Total Bilirubin: 0.5 mg/dL (ref 0.2–1.2)
Total Protein: 6.5 g/dL (ref 6.1–8.1)
eGFR: 66 mL/min/{1.73_m2} (ref >=60)

## 2023-01-07 LAB — LIPID PANEL
Cholesterol: 152 mg/dL (ref <200)
HDL: 52 mg/dL (ref >=50)
LDL Cholesterol (Calc): 80 mg/dL (calc)
Non-HDL Cholesterol (Calc): 100 mg/dL (calc) (ref <130)
Total CHOL/HDL Ratio: 2.9 (calc) (ref <5.0)
Triglycerides: 105 mg/dL (ref <150)

## 2023-01-07 LAB — HEMOGLOBIN A1C
Hgb A1c MFr Bld: 5.6 % of total Hgb (ref <5.7)
Mean Plasma Glucose: 114 mg/dL
eAG (mmol/L): 6.3 mmol/L

## 2023-01-07 LAB — TSH: TSH: 1.45 mIU/L (ref 0.40–4.50)

## 2023-01-07 LAB — T4, FREE: Free T4: 1.2 ng/dL (ref 0.8–1.8)

## 2023-01-07 LAB — VITAMIN D 25 HYDROXY (VIT D DEFICIENCY, FRACTURES): Vit D, 25-Hydroxy: 97 ng/mL (ref 30–100)

## 2023-01-24 ENCOUNTER — Encounter: Payer: Self-pay | Admitting: Nurse Practitioner

## 2023-01-24 DIAGNOSIS — J028 Acute pharyngitis due to other specified organisms: Secondary | ICD-10-CM

## 2023-01-25 MED ORDER — NYSTATIN 100000 UNIT/ML MT SUSP: OROMUCOSAL | 1 refills | Status: DC

## 2023-02-08 ENCOUNTER — Ambulatory Visit (HOSPITAL_COMMUNITY): Payer: PPO | Admitting: Licensed Clinical Social Worker

## 2023-02-08 DIAGNOSIS — F331 Major depressive disorder, recurrent, moderate: Secondary | ICD-10-CM

## 2023-02-08 DIAGNOSIS — F411 Generalized anxiety disorder: Secondary | ICD-10-CM | POA: Diagnosis not present

## 2023-02-08 NOTE — Progress Notes (Signed)
THERAPIST PROGRESS NOTE   Session Time: 1:00pm - 2:00pm       Location: Patient: [facility] Provider: [facility]     Participation Level: Active    Behavioral Response: Alert, casually dressed, depressed mood/affect   Type of Therapy:  Individual Therapy   Treatment Goals addressed: Depression and anxiety management; Medication compliance__________*****  Progress Towards Goals: Ongoing__________*****   Interventions: CBT: challenging anxious thoughts __________*****   Summary: Sarah Rodriguez is a 67 year old married Caucasian female on disability that presented for therapy appointment today with diagnoses of Major depressive disorder, recurrent, moderate; and Generalized Anxiety Disorder.        Suicidal/Homicidal: None; without intent or plan.     Therapist Response:  Clinician met with Sarah Rodriguez for in person therapy appointment today, and assessed for safety, medication compliance, and sobriety.  Sarah Rodriguez presented for session on time, and was alert, oriented x5, with no evidence or self-report of active SI/HI or A/V H.  Sarah Rodriguez reported ongoing compliance with medication and denied use of alcohol or illicit substances. Clinician inquired about Sarah Rodriguez's current emotional ratings, as well as any significant changes in thoughts, feelings, or behavior since last check-in.  Sarah Rodriguez reported scores of 9/10 for depression, 7/10 for anxiety, and 7/10 for irritability.  She denied any recent outbursts or panic attacks.  Sarah Rodriguez reported that a recent struggle has been dealing with severe depression, which was significantly impacted by travelling to help arrange her daughter's baby shower event out of town.  Sarah Rodriguez reported that this was very stressful, and she felt like she messed up, stating "I was very embarrassed.  It feels like everything I try to do, I mess up".  Clinician revisited topic of core beliefs with Sarah Rodriguez today to guide discussion, reminding her that core beliefs are the most central ideas  one holds about themselves, which can also influence automatic negative thoughts.  Clinician utilized CBT concept to explain how this thinking affects feelings and behavior in turn, and consequences of allowing core beliefs to remain were noted as well, including tendency to distrust others, aggressive behavior, unhealthy boundaries, and worsening anxiety, depression, or reliance upon drugs/alcohol to cope.  Clinician assisted Sarah Rodriguez with challenging her negative thoughts in order to resolve them via use of positive affirmations, or looking for evidence that contradicts these beliefs.  Interveniton was effective, as evidenced by Sarah Rodriguez in disucsison on subejct, reporting that    ___________ -what is evidence,  -husband had addirmaiton, need positive suppoet as well -no to group -yhes to more therapy   Revisisted material on core beliefs ****  -its been hard. Went to [place], was worried, but did it.   -was watching weather, feared snow/bad weather would hit.  -daughter wanted her to help with decorations, metal arch? Pacnicked, put stuff up, tangled, awful.    Emabrassed, felt like she messed up.  Good shower, good turnout. Overwhlmeed. -I messed up all this stuff. : travel, planning, preparations , by all definion, she did a great job.    -you just love conflict *** - daughter ; reinforcing negative believes, bad memories ***  husbandL: you're beautfil, smart (positive messaing)  No confidence, afeaid to say the wrong thing or do the wrong thing ***  -pretyy isolated *** not going back to churck- very tearful   -looking into cochlear impant ***  -did go out with some work friends at one point to catch up.       -dont want to talk abouit  past few weeks, feel like she will just cry.  -frustrated by front desk, never been treated like that.  Didn't understand AOB, offered to help and she declined.  Challenged anxious thoughrs/core beliefs, I sounded dumb, stupid, difficult.   -chose not to egnage, resistant to suggestions.  ***************  Felt like she needed to be happy, optimisitc, inauethicni    ***recommend to meet more frequenlty, srill reisent to group (can't listen until iplant ) ****biweekly **** Clinician will continue to monitor.              Plan: Follow up for next session in 1 month.   Diagnosis: Major depressive disorder, recurrent, moderate; and Generalized Anxiety Disorder.  Collaboration of Care:   No collaboration of care required for this visit.                                                   Patient/Guardian was advised Release of Information must be obtained prior to any record release in order to collaborate their care with an outside provider. Patient/Guardian was advised if they have not already done so to contact the registration department to sign all necessary forms in order for [place] to release information regarding their care.    Consent: Patient/Guardian gives verbal consent for treatment and assignment of benefits for services provided during this visit. Patient/Guardian expressed understanding and agreed to proceed.   Shade Flood, [place], LCAS 02/08/23

## 2023-02-10 ENCOUNTER — Ambulatory Visit (HOSPITAL_COMMUNITY): Payer: PPO | Admitting: Psychiatry

## 2023-02-10 ENCOUNTER — Encounter (HOSPITAL_COMMUNITY): Payer: Self-pay | Admitting: Psychiatry

## 2023-02-10 DIAGNOSIS — F411 Generalized anxiety disorder: Secondary | ICD-10-CM | POA: Diagnosis not present

## 2023-02-10 DIAGNOSIS — F331 Major depressive disorder, recurrent, moderate: Secondary | ICD-10-CM | POA: Diagnosis not present

## 2023-02-10 MED ORDER — AMITRIPTYLINE HCL 25 MG PO TABS: 25.0000 mg | ORAL_TABLET | Freq: Every day | ORAL | 0 refills | Status: DC

## 2023-02-10 MED ORDER — LAMOTRIGINE 25 MG PO TABS: 25.0000 mg | ORAL_TABLET | Freq: Two times a day (BID) | ORAL | 0 refills | Status: DC

## 2023-02-10 MED ORDER — CLONAZEPAM 0.5 MG PO TABS: ORAL_TABLET | ORAL | 1 refills | Status: DC

## 2023-02-10 NOTE — Progress Notes (Signed)
[place] MD/PA/NP OP Progress Note   Patient Location: Office Provider Location: Office  02/10/2023 2:32 PM Sarah Rodriguez  MRN:  [id number]   HPI:  Patient came for her follow-up appointment.  She admitted lately more anxious and nervous.  She gets sometime embarrassed because of hearing issues.  She went to Bible study and lost some communication due to hearing impairment and feels very uncomfortable to ask question.  She wears hearing aid but now had appointment coming up with specialist to discuss if she need cochlear implant.  On the other hand patient do very well when she is by herself.  She did drove back from [place] and cold weather when there is a snow on the road around Christmas time.  She feels proud that he did not had a panic attack.  She sleeps most of the night is okay.  She denies any crying spells, feeling of hopelessness but admitted dysphoria and sometimes feeling overwhelmed due to her hearing aid.  She feels sometimes tired fatigue and lack of energy to do things.  Her daughter is expected and having a baby around April 28.  Her daughter lives in [place].  Patient has a good support from her husband.  She is tolerating her medication and reported no side effects.  She again forgot to try higher dose of amitriptyline.  She has no rash, itching tremors or shakes.     Visit Diagnosis:    ICD-10-CM   1. GAD (generalized anxiety disorder)  F41.1 amitriptyline (ELAVIL) 25 MG tablet    clonazePAM (KLONOPIN) 0.5 MG tablet    2. MDD (major depressive disorder), recurrent episode, moderate (HCC)  F33.1 amitriptyline (ELAVIL) 25 MG tablet    clonazePAM (KLONOPIN) 0.5 MG tablet    lamoTRIgine (LAMICTAL) 25 MG tablet      Past Psychiatric History:  H/O depression and taking antidepressants since 1996.  H/O panic attacks while driving.  Saw Dr. Letta Moynahan and tried Prozac, Paxil, Zoloft, Xanax, Cymbalta and Klonopin.  No h/o inpatient treatment or any suicidal  attempt.  Past Medical History:  Past Medical History:  Diagnosis Date   Asthma    Dulera daily   Chronic back pain    HNP   Complication of anesthesia    slow to wake up   Depression    takes Clonazepam nightly   GERD (gastroesophageal reflux disease)    takes Protonix daily    Heart murmur    History of migraine    takes Relpax daily as needed   HSV (herpes simplex virus) infection    Hyperlipidemia    takes Atorvastatin daily   Hypertension    takes Benicar daily   Hypothyroidism    takes Synthroid daily   MVP (mitral valve prolapse)    Pneumonia    hx of--been many yrs ago   Seasonal allergies    takes Allegra daily   Shortness of breath    with exertion    Past Surgical History:  Procedure Laterality Date   ABLATION  2011   Uterine   buttocks surgery     at age 18 (coccyx repair)   CERVICAL FUSION  03/2010   CESAREAN SECTION  1992   CHOLECYSTECTOMY  2008   COLONOSCOPY     DILATION AND CURETTAGE OF UTERUS  2011   ESOPHAGOGASTRODUODENOSCOPY     LUMBAR LAMINECTOMY/DECOMPRESSION MICRODISCECTOMY  10/24/2012   Procedure: LUMBAR LAMINECTOMY/DECOMPRESSION MICRODISCECTOMY 1 LEVEL;  Surgeon: Kristeen Miss, MD;  Location: [facility];  Service:  Neurosurgery;  Laterality: Right;  Right Lumbar five-Sacral One Microdiskectomy   right knee arthroscopy     x 3   TONSILLECTOMY     at age 33    Family Psychiatric History: Reviewed.  Family History:  Family History  Problem Relation Age of Onset   Arthritis Mother    Heart attack Mother    Depression Mother    Allergies Mother    Hypertension Mother    Heart attack Father     Social History:  Social History   Socioeconomic History   Marital status: Married    Spouse name: Not on file   Number of children: Not on file   Years of education: Not on file   Highest education level: Not on file  Occupational History   Not on file  Tobacco Use   Smoking status: Former   Smokeless tobacco: Never   Tobacco  comments:    quit at age 62  Vaping Use   Vaping Use: Never used  Substance and Sexual Activity   Alcohol use: No    Comment: 2-3 times a yr   Drug use: No   Sexual activity: Yes    Birth control/protection: Post-menopausal  Other Topics Concern   Not on file  Social History Narrative   Not on file   Social Determinants of Health   Financial Resource Strain: Not on file  Food Insecurity: Not on file  Transportation Needs: Not on file  Physical Activity: Not on file  Stress: Not on file  Social Connections: Not on file    Allergies:  Allergies  Allergen Reactions   Demerol [Meperidine] Other (See Comments)    REACTION: unknown--itching   Epinephrine Other (See Comments)    "Sensitivity."   Niacin And Related Rash    Metabolic Disorder Labs: Lab Results  Component Value Date   HGBA1C 5.6 01/06/2023   MPG 114 01/06/2023   MPG 117 09/30/2022   No results found for: "PROLACTIN" Lab Results  Component Value Date   CHOL 152 01/06/2023   TRIG 105 01/06/2023   HDL 52 01/06/2023   CHOLHDL 2.9 01/06/2023   VLDL 31 (H) 06/06/2017   LDLCALC 80 01/06/2023   LDLCALC 70 09/30/2022   Lab Results  Component Value Date   TSH 1.45 01/06/2023   TSH 1.87 09/08/2022    Therapeutic Level Labs: No results found for: "LITHIUM" No results found for: "VALPROATE" No results found for: "CBMZ"  Current Medications: Current Outpatient Medications  Medication Sig Dispense Refill   albuterol (PROAIR HFA) 108 (90 Base) MCG/ACT inhaler Inhale 2 puffs into the lungs every 6 (six) hours as needed for wheezing or shortness of breath. 8 g 1   amitriptyline (ELAVIL) 25 MG tablet Take 1 tablet (25 mg total) by mouth at bedtime. 90 tablet 0   Cholecalciferol (VITAMIN D PO) Take 4,000 Units by mouth daily.      CINNAMON PO Take 1,000 Units by mouth 2 (two) times daily.      clonazePAM (KLONOPIN) 0.5 MG tablet Take 1/2 to 1 tablet as needed for severe anxiety. 30 tablet 1   Cyanocobalamin  (B-12 PO) Take by mouth.     Cyanocobalamin (B-12) 2500 MCG TABS Take by mouth.     diclofenac sodium (VOLTAREN) 1 % GEL Apply 4 g topically 4 (four) times daily. 100 g 3   DULoxetine (CYMBALTA) 20 MG capsule Take 2 capsules (40 mg total) by mouth daily. 180 capsule 0   fluticasone (FLONASE) 50 MCG/ACT  nasal spray SHAKE LIQUID AND USE 2 SPRAYS IN EACH NOSTRIL EVERY EVENING 48 g 3   fluticasone-salmeterol (ADVAIR HFA) 115-21 MCG/ACT inhaler Use  2 inhalations  15 minutes apart  2 x /day (every 12 hours) 36 g 3   lamoTRIgine (LAMICTAL) 25 MG tablet Take 1 tablet (25 mg total) by mouth 2 (two) times daily. 180 tablet 0   levothyroxine (SYNTHROID) 25 MCG tablet Take  1 tablet  Daily  on an empty stomach with only water for 30 minutes & no Antacid meds, Calcium or Magnesium for 4 hours & avoid Biotin  (  Dose change from 50 to 25 mcg ) 90 tablet 1   losartan (COZAAR) 50 MG tablet Take  1 tablet  Daily  for BP                                                                               /                  TAKE BY MOUTH 90 tablet 3   Magnesium 250 MG TABS Take 3 tablets by mouth daily.      meloxicam (MOBIC) 15 MG tablet Take  1 tablet Daily with Food for Pain & Inflammation                                            /                      TAKE                   BY                      MOUTH 90 tablet 3   montelukast (SINGULAIR) 10 MG tablet Take 1 tablet Daily for Allergies                                                                              /                                                                          TAKE                                        BY  MOUTH 90 tablet 3   nystatin (MYCOSTATIN) 100000 UNIT/ML suspension SWISH AND SWALLOW 5 ML BY MOUTH FOUR TIMES DAILY FOR THRUSH OR YEAST INFECTION 480 mL 1   pantoprazole (PROTONIX) 40 MG tablet TAKE 1 TABLET BY MOUTH DAILY 90 tablet 3   PREMARIN vaginal cream INSERT 1/2 GRAM VAGINALLY VIA APPLICATOR NIGHTLY  FOR W EEKS THEN JUST DO TEICE A WEEK  11   promethazine-dextromethorphan (PROMETHAZINE-DM) 6.25-15 MG/5ML syrup Take 2.5 mLs by mouth 4 (four) times daily as needed for cough. 240 mL 0   rosuvastatin (CRESTOR) 40 MG tablet Take  1 tablet  2 x /week  for Cholesterol                                                      /                                 TAKE                  BY                MOUTH 26 tablet 3   Spacer/Aero-Holding Chambers (AEROCHAMBER MV) inhaler Use as instructed with inhaler. 1 each 0   No current facility-administered medications for this visit.   Screenings: GAD-7    Health and safety inspector from 05/18/2022 in [place] at [facility] from 05/07/2021 in [place] at [facility]  Total GAD-7 Score 5 [place] Office Visit from 01/06/2023 in [place] [facility] [place] Office Visit from 09/30/2022 in [place] [facility] Counselor from 05/18/2022 in [place] at [employer] [facility] from 01/06/2022 in [place] [facility] Office Visit from 09/30/2021 in [place] [facility]  PHQ-2 Total Score 1 0 2 1 0  PHQ-9 Total Score -- -- 6 -- --      Flowsheet Row Counselor from 05/18/2022 in [place] at [facility] from 05/07/2021 in [place] at [facility] Video Visit from 05/05/2021 in [place] No Risk No Risk No Risk       Psychiatric Specialty Exam: Physical Exam  Review of Systems  HENT:  Positive for hearing loss.     Blood pressure (!) 181/94, pulse 80, resp. rate 18, height '5\' 1"'$  (1.549 m), weight 103 lb (46.7 kg).There is no height or weight on file to calculate BMI.  General Appearance: Casual  Eye Contact:  Good  Speech:   Normal Rate  Volume:  Normal  Mood:  Anxious and Dysphoric  Affect:  Appropriate  Thought Process:  Goal Directed  Orientation:  Full (Time, Place, and Person)  Thought Content:  Rumination  Suicidal Thoughts:  No  Homicidal Thoughts:  No  Memory:  Immediate;   Good Recent;   Good Remote;   Fair  Judgement:  Good  Insight:  Good  Psychomotor Activity:  Normal  Concentration:  Concentration: Good and Attention Span: Good  Recall:  Good  Fund of Knowledge:  Good  Language:  Good  Akathisia:  No  Handed:  Right  AIMS (if indicated):  Assets:  Communication Skills Desire for Improvement Housing Resilience Transportation  ADL's:  Intact  Cognition:  WNL  Sleep:   7 hrs        Assessment and Plan:  Major depressive disorder, recurrent.  Anxiety.  Discussed anxiety, resolved depression.  Patient is going to see the specialist for possibility of cochlear implant.  She believes some of her anxiety is due to lack of hearing.  In the past she had tried Cymbalta 60 mg but felt dizzy but now she like to go back to see if that helps and she will take at nighttime.  Currently she is taking Cymbalta 40 mg daily.  She forgets to take amitriptyline 1-1/2 pill.  I recommend not to increase the amitriptyline and keep to 25 mg at bedtime, continue Lamictal 25 mg 2 times a day and she continue Klonopin one fourth as needed for severe anxiety.  Patient will try 60 mg Cymbalta and if it helped then she will call us back and we will write the new prescription.  If patient did not tolerate 60 mg she will keep the Cymbalta 20 mg 2 pills a day.  I recommend to call us back if he has any question or any concern.  Follow-up in 3 months.  Collaboration of Care: Collaboration of Care: Primary Care Provider AEB notes are available in epic to review.  Patient/Guardian was advised Release of Information must be obtained prior to any record release in order to collaborate their care with an outside  provider. Patient/Guardian was advised if they have not already done so to contact the registration department to sign all necessary forms in order for [place] to release information regarding their care.   Consent: Patient/Guardian gives verbal consent for treatment and assignment of benefits for services provided during this visit. Patient/Guardian expressed understanding and agreed to proceed.    Kathlee Nations, MD 02/10/2023, 2:32 PM

## 2023-02-17 DIAGNOSIS — H903 Sensorineural hearing loss, bilateral: Secondary | ICD-10-CM | POA: Diagnosis not present

## 2023-02-17 DIAGNOSIS — R42 Dizziness and giddiness: Secondary | ICD-10-CM | POA: Diagnosis not present

## 2023-02-22 ENCOUNTER — Telehealth: Payer: Self-pay | Admitting: Nurse Practitioner

## 2023-02-22 DIAGNOSIS — K219 Gastro-esophageal reflux disease without esophagitis: Secondary | ICD-10-CM

## 2023-02-22 MED ORDER — PANTOPRAZOLE SODIUM 40 MG PO TBEC: DELAYED_RELEASE_TABLET | ORAL | 3 refills | Status: DC

## 2023-02-22 NOTE — Addendum Note (Signed)
Addended by: Chancy Hurter on: 02/22/2023 12:21 PM   Modules accepted: Orders

## 2023-02-22 NOTE — Telephone Encounter (Signed)
Pt has changed pharmacies and is needing her pantoprazole filled at the [facility] on [address]

## 2023-03-01 ENCOUNTER — Telehealth: Payer: Self-pay | Admitting: Nurse Practitioner

## 2023-03-01 ENCOUNTER — Other Ambulatory Visit: Payer: Self-pay | Admitting: Nurse Practitioner

## 2023-03-01 MED ORDER — MELOXICAM 15 MG PO TABS: ORAL_TABLET | ORAL | 3 refills | Status: DC

## 2023-03-01 NOTE — Telephone Encounter (Signed)
Pt is requesting  a refill on meloxicam to go to [facility] on file

## 2023-03-03 ENCOUNTER — Other Ambulatory Visit (HOSPITAL_COMMUNITY): Payer: Self-pay | Admitting: Psychiatry

## 2023-03-03 DIAGNOSIS — F411 Generalized anxiety disorder: Secondary | ICD-10-CM

## 2023-03-03 DIAGNOSIS — F331 Major depressive disorder, recurrent, moderate: Secondary | ICD-10-CM

## 2023-03-07 ENCOUNTER — Other Ambulatory Visit (HOSPITAL_COMMUNITY): Payer: Self-pay | Admitting: *Deleted

## 2023-03-07 DIAGNOSIS — F411 Generalized anxiety disorder: Secondary | ICD-10-CM

## 2023-03-07 DIAGNOSIS — F331 Major depressive disorder, recurrent, moderate: Secondary | ICD-10-CM

## 2023-03-07 MED ORDER — DULOXETINE HCL 20 MG PO CPEP
40.0000 mg | ORAL_CAPSULE | Freq: Every day | ORAL | 0 refills | Status: DC
Start: 2023-03-07 — End: 2023-05-02

## 2023-03-21 ENCOUNTER — Telehealth: Payer: Self-pay | Admitting: Nurse Practitioner

## 2023-03-21 DIAGNOSIS — J4520 Mild intermittent asthma, uncomplicated: Secondary | ICD-10-CM

## 2023-03-21 DIAGNOSIS — J301 Allergic rhinitis due to pollen: Secondary | ICD-10-CM

## 2023-03-21 MED ORDER — FLUTICASONE PROPIONATE 50 MCG/ACT NA SUSP
NASAL | 3 refills | Status: DC
Start: 2023-03-21 — End: 2023-09-12

## 2023-03-21 NOTE — Telephone Encounter (Signed)
Pt needs Fluticasone nasal spray to be sent to [facility] on file

## 2023-03-23 DIAGNOSIS — H43813 Vitreous degeneration, bilateral: Secondary | ICD-10-CM | POA: Diagnosis not present

## 2023-03-29 ENCOUNTER — Telehealth: Payer: Self-pay | Admitting: Nurse Practitioner

## 2023-03-29 DIAGNOSIS — Z13820 Encounter for screening for osteoporosis: Secondary | ICD-10-CM | POA: Diagnosis not present

## 2023-03-29 DIAGNOSIS — Z1231 Encounter for screening mammogram for malignant neoplasm of breast: Secondary | ICD-10-CM | POA: Diagnosis not present

## 2023-03-29 DIAGNOSIS — Z6820 Body mass index (BMI) 20.0-20.9, adult: Secondary | ICD-10-CM | POA: Diagnosis not present

## 2023-03-29 DIAGNOSIS — Z124 Encounter for screening for malignant neoplasm of cervix: Secondary | ICD-10-CM | POA: Diagnosis not present

## 2023-03-29 DIAGNOSIS — Z01419 Encounter for gynecological examination (general) (routine) without abnormal findings: Secondary | ICD-10-CM | POA: Diagnosis not present

## 2023-03-29 DIAGNOSIS — Z780 Asymptomatic menopausal state: Secondary | ICD-10-CM | POA: Diagnosis not present

## 2023-03-29 DIAGNOSIS — N904 Leukoplakia of vulva: Secondary | ICD-10-CM | POA: Diagnosis not present

## 2023-03-29 DIAGNOSIS — Z01411 Encounter for gynecological examination (general) (routine) with abnormal findings: Secondary | ICD-10-CM | POA: Diagnosis not present

## 2023-03-29 DIAGNOSIS — N952 Postmenopausal atrophic vaginitis: Secondary | ICD-10-CM | POA: Diagnosis not present

## 2023-03-29 DIAGNOSIS — N3941 Urge incontinence: Secondary | ICD-10-CM | POA: Diagnosis not present

## 2023-03-29 MED ORDER — LEVOTHYROXINE SODIUM 25 MCG PO TABS: ORAL_TABLET | ORAL | 1 refills | Status: DC

## 2023-03-29 NOTE — Telephone Encounter (Signed)
Pt is needing a refill on levothyroxine to go to [facility] on file

## 2023-03-29 NOTE — Addendum Note (Signed)
Addended by: Dionicio Stall on: 03/29/2023 04:01 PM   Modules accepted: Orders

## 2023-04-05 ENCOUNTER — Encounter: Payer: Self-pay | Admitting: Nurse Practitioner

## 2023-04-05 ENCOUNTER — Ambulatory Visit (INDEPENDENT_AMBULATORY_CARE_PROVIDER_SITE_OTHER): Payer: PPO | Admitting: Nurse Practitioner

## 2023-04-05 VITALS — BP 140/76 | HR 71 | Temp 98.0°F | Ht 61.5 in | Wt 102.4 lb

## 2023-04-05 DIAGNOSIS — Z8269 Family history of other diseases of the musculoskeletal system and connective tissue: Secondary | ICD-10-CM | POA: Diagnosis not present

## 2023-04-05 DIAGNOSIS — M79674 Pain in right toe(s): Secondary | ICD-10-CM | POA: Diagnosis not present

## 2023-04-05 NOTE — Patient Instructions (Signed)
Gout  Gout is painful swelling of your joints. Gout is a type of arthritis. It is caused by having too much uric acid in your body. Uric acid is a chemical that is made when your body breaks down substances called purines. If your body has too much uric acid, sharp crystals can form and build up in your joints. This causes pain and swelling. Gout attacks can happen quickly and be very painful (acute gout). Over time, the attacks can affect more joints and happen more often (chronic gout). What are the causes? Gout is caused by too much uric acid in your blood. This can happen because: Your kidneys do not remove enough uric acid from your blood. Your body makes too much uric acid. You eat too many foods that are high in purines. These foods include organ meats, some seafood, and beer. Trauma or stress can bring on an attack. What increases the risk? Having a family history of gout. Being female and middle-aged. Being female and having gone through menopause. Having an organ transplant. Taking certain medicines. Having certain conditions, such as: Being very overweight (obese). Lead poisoning. Kidney disease. A skin condition called psoriasis. Other risks include: Losing weight too quickly. Not having enough water in the body (being dehydrated). Drinking alcohol, especially beer. Drinking beverages that are sweetened with a type of sugar called fructose. What are the signs or symptoms? An attack of acute gout often starts at night and usually happens in just one joint. The most common place is the big toe. Other joints that may be affected include joints of the feet, ankle, knee, fingers, wrist, or elbow. Symptoms may include: Very bad pain. Warmth. Swelling. Stiffness. Tenderness. The affected joint may be very painful to touch. Shiny, red, or purple skin. Chills and fever. Chronic gout may cause symptoms more often. More joints may be involved. You may also have white or yellow lumps  (tophi) on your hands or feet or in other areas near your joints. How is this treated? Treatment for an acute attack may include medicines for pain and swelling, such as: NSAIDs, such as ibuprofen. Steroids taken by mouth or injected into a joint. Colchicine. This can be given by mouth or through an IV tube. Treatment to prevent future attacks may include: Taking small doses of NSAIDs or colchicine daily. Using a medicine that reduces uric acid levels in your blood, such as allopurinol. Making changes to your diet. You may need to see a food expert (dietitian) about what to eat and drink to prevent gout. Follow these instructions at home: During a gout attack  If told, put ice on the painful area. To do this: Put ice in a plastic bag. Place a towel between your skin and the bag. Leave the ice on for 20 minutes, 2-3 times a day. Take off the ice if your skin turns bright red. This is very important. If you cannot feel pain, heat, or cold, you have a greater risk of damage to the area. Raise the painful joint above the level of your heart as often as you can. Rest the joint as much as possible. If the joint is in your leg, you may be given crutches. Follow instructions from your doctor about what you cannot eat or drink. Avoiding future gout attacks Eat a low-purine diet. Avoid foods and drinks such as: Liver. Kidney. Anchovies. Asparagus. Herring. Mushrooms. Mussels. Beer. Stay at a healthy weight. If you want to lose weight, talk with your doctor. Do not   lose weight too fast. Start or continue an exercise plan as told by your doctor. Eating and drinking Avoid drinks sweetened by fructose. Drink enough fluids to keep your pee (urine) pale yellow. If you drink alcohol: Limit how much you have to: 0-1 drink a day for women who are not pregnant. 0-2 drinks a day for men. Know how much alcohol is in a drink. In the [place] one drink equals one 12 oz bottle of beer (355 mL), one 5 oz  glass of wine (148 mL), or one 1 oz glass of hard liquor (44 mL). General instructions Take over-the-counter and prescription medicines only as told by your doctor. Ask your doctor if you should avoid driving or using machines while you are taking your medicine. Return to your normal activities when your doctor says that it is safe. Keep all follow-up visits. Where to find more information National Institutes of Health: www.niams.nih.gov Contact a doctor if: You have another gout attack. You still have symptoms of a gout attack after 10 days of treatment. You have problems (side effects) because of your medicines. You have chills or a fever. You have burning pain when you pee (urinate). You have pain in your lower back or belly. Get help right away if: You have very bad pain. Your pain cannot be controlled. You cannot pee. Summary Gout is painful swelling of the joints. The most common site of pain is the big toe, but it can affect other joints. Medicines and avoiding some foods can help to prevent and treat gout attacks. This information is not intended to replace advice given to you by your health care provider. Make sure you discuss any questions you have with your health care provider. Document Revised: 08/19/2021 Document Reviewed: 08/19/2021 Elsevier Patient Education  2023 [employer]  

## 2023-04-05 NOTE — Progress Notes (Signed)
Assessment and Plan:  Sarah Rodriguez was seen today for an episodic visit.  Diagnoses and all order for this visit:  Great toe pain, right RICE Method Continue Meloxicam May take low dose 5 mg prednisone PRN Continue to monitor  Family history of gout Discussed low purine diet Possible allopurinol or colcrys if elevated uric acid levels  - Uric acid   Notify office for further evaluation and treatment, questions or concerns if s/s fail to improve. The risks and benefits of my recommendations, as well as other treatment options were discussed with the patient today. Questions were answered.  Further disposition pending results of labs. Discussed med's effects and SE's.    Over 15 minutes of exam, counseling, chart review, and critical decision making was performed.   Future Appointments  Date Time Provider Department Center  04/11/2023  2:30 PM Lucky Cowboy, MD [facility] None  05/12/2023  1:00 PM Arfeen, Phillips Grout, MD [facility] None  10/06/2023  3:00 PM Lucky Cowboy, MD [facility] None  01/09/2024 11:00 AM Adela Glimpse, NP [facility] None    ------------------------------------------------------------------------------------------------------------------   HPI BP (!) 140/76   Pulse 71   Temp 98 F (36.7 C)   Ht 5' 1.5" (1.562 m)   Wt 102 lb 6.4 oz (46.4 kg)   SpO2 99%   BMI 19.03 kg/m   Sarah Rodriguez presents for evaluation of right great toe pain x4 days.  States that she has a family hx of gout, however, she does not remember consuming a high purine diet other than eating shell fish/shrimp one week ago.  She did have her grandchildren over for the weekend and does not feel as though she injured the toe, although she does recall being very active with them.  She has inspected the toe for injury, soreness, cut but has not seen any trace of injury.  She denies swelling, redness.  She does admit to having trouble walking d/t pain.  She takes Meloxicam 15 mg daily.  She  has Prednisone at home but has not taken.    Past Medical History:  Diagnosis Date   Asthma    Dulera daily   Chronic back pain    HNP   Complication of anesthesia    slow to wake up   Depression    takes Clonazepam nightly   GERD (gastroesophageal reflux disease)    takes Protonix daily    Heart murmur    History of migraine    takes Relpax daily as needed   HSV (herpes simplex virus) infection    Hyperlipidemia    takes Atorvastatin daily   Hypertension    takes Benicar daily   Hypothyroidism    takes Synthroid daily   MVP (mitral valve prolapse)    Pneumonia    hx of--been many yrs ago   Seasonal allergies    takes Allegra daily   Shortness of breath    with exertion     Allergies  Allergen Reactions   Demerol [Meperidine] Other (See Comments)    REACTION: unknown--itching   Epinephrine Other (See Comments)    "Sensitivity."   Niacin And Related Rash    Current Outpatient Medications on File Prior to Visit  Medication Sig   albuterol (PROAIR HFA) 108 (90 Base) MCG/ACT inhaler Inhale 2 puffs into the lungs every 6 (six) hours as needed for wheezing or shortness of breath.   amitriptyline (ELAVIL) 25 MG tablet Take 1 tablet (25 mg total) by mouth at bedtime.   Cholecalciferol (VITAMIN  D PO) Take 4,000 Units by mouth daily.    CINNAMON PO Take 1,000 Units by mouth 2 (two) times daily.    clonazePAM (KLONOPIN) 0.5 MG tablet Take 1/2 to 1 tablet as needed for severe anxiety.   Cyanocobalamin (B-12 PO) Take by mouth.   Cyanocobalamin (B-12) 2500 MCG TABS Take by mouth.   DULoxetine (CYMBALTA) 20 MG capsule Take 2 capsules (40 mg total) by mouth daily.   fluticasone (FLONASE) 50 MCG/ACT nasal spray SHAKE LIQUID AND USE 2 SPRAYS IN EACH NOSTRIL EVERY EVENING   fluticasone-salmeterol (ADVAIR HFA) 115-21 MCG/ACT inhaler Use  2 inhalations  15 minutes apart  2 x /day (every 12 hours)   lamoTRIgine (LAMICTAL) 25 MG tablet Take 1 tablet (25 mg total) by mouth 2 (two) times  daily.   levothyroxine (SYNTHROID) 25 MCG tablet Take  1 tablet  Daily  on an empty stomach with only water for 30 minutes & no Antacid meds, Calcium or Magnesium for 4 hours & avoid Biotin  (  Dose change from 50 to 25 mcg )   losartan (COZAAR) 50 MG tablet Take  1 tablet  Daily  for BP                                                                               /                  TAKE BY MOUTH   Magnesium 250 MG TABS Take 3 tablets by mouth daily.    meloxicam (MOBIC) 15 MG tablet Take  1 tablet Daily with Food for Pain & Inflammation                                            /                      TAKE                   BY                      MOUTH   montelukast (SINGULAIR) 10 MG tablet Take 1 tablet Daily for Allergies                                                                              /  TAKE                                        BY                                  MOUTH   nystatin (MYCOSTATIN) 100000 UNIT/ML suspension SWISH AND SWALLOW 5 ML BY MOUTH FOUR TIMES DAILY FOR THRUSH OR YEAST INFECTION   pantoprazole (PROTONIX) 40 MG tablet TAKE 1 TABLET BY MOUTH DAILY   PREMARIN vaginal cream INSERT 1/2 GRAM VAGINALLY VIA APPLICATOR NIGHTLY FOR W EEKS THEN JUST DO TEICE A WEEK   promethazine-dextromethorphan (PROMETHAZINE-DM) 6.25-15 MG/5ML syrup Take 2.5 mLs by mouth 4 (four) times daily as needed for cough.   rosuvastatin (CRESTOR) 40 MG tablet Take  1 tablet  2 x /week  for Cholesterol                                                      /                                 TAKE                  BY                MOUTH   Spacer/Aero-Holding Chambers (AEROCHAMBER MV) inhaler Use as instructed with inhaler.   No current facility-administered medications on file prior to visit.    ROS: all negative except what is noted in the HPI.   Physical Exam:  BP (!) 140/76   Pulse 71   Temp 98 F (36.7 C)   Ht 5' 1.5"  (1.562 m)   Wt 102 lb 6.4 oz (46.4 kg)   SpO2 99%   BMI 19.03 kg/m   General Appearance: NAD.  Awake, conversant and cooperative. Eyes: PERRLA, EOMs intact.  Sclera white.  Conjunctiva without erythema. Sinuses: No frontal/maxillary tenderness.  No nasal discharge. Nares patent.  ENT/Mouth: Ext aud canals clear.  Bilateral TMs w/DOL and without erythema or bulging. Hearing intact.  Posterior pharynx without swelling or exudate.  Tonsils without swelling or erythema.  Neck: Supple.  No masses, nodules or thyromegaly. Respiratory: Effort is regular with non-labored breathing. Breath sounds are equal bilaterally without rales, rhonchi, wheezing or stridor.  Cardio: RRR with no MRGs. Brisk peripheral pulses without edema.  Abdomen: Active BS in all four quadrants.  Soft and non-tender without guarding, rebound tenderness, hernias or masses. Lymphatics: Non tender without lymphadenopathy.  Musculoskeletal: Full ROM, 5/5 strength, normal ambulation.  No clubbing or cyanosis. Skin: Right great toe WNL.  Appropriate color for ethnicity. Warm without rashes, lesions, ecchymosis, ulcers.  Neuro: CN II-XII grossly normal. Normal muscle tone without cerebellar symptoms and intact sensation.   Psych: AO X 3,  appropriate mood and affect, insight and judgment.    Adela Glimpse, NP 9:18 AM [facility]

## 2023-04-06 LAB — URIC ACID: Uric Acid, Serum: 4 mg/dL (ref 2.5–7.0)

## 2023-04-11 ENCOUNTER — Ambulatory Visit (INDEPENDENT_AMBULATORY_CARE_PROVIDER_SITE_OTHER): Payer: PPO | Admitting: Internal Medicine

## 2023-04-11 ENCOUNTER — Encounter: Payer: Self-pay | Admitting: Internal Medicine

## 2023-04-11 VITALS — BP 128/80 | HR 93 | Temp 97.9°F | Resp 16 | Ht 61.5 in | Wt 104.2 lb

## 2023-04-11 DIAGNOSIS — R7309 Other abnormal glucose: Secondary | ICD-10-CM

## 2023-04-11 DIAGNOSIS — I10 Essential (primary) hypertension: Secondary | ICD-10-CM | POA: Diagnosis not present

## 2023-04-11 DIAGNOSIS — K219 Gastro-esophageal reflux disease without esophagitis: Secondary | ICD-10-CM | POA: Diagnosis not present

## 2023-04-11 DIAGNOSIS — E559 Vitamin D deficiency, unspecified: Secondary | ICD-10-CM | POA: Diagnosis not present

## 2023-04-11 DIAGNOSIS — E782 Mixed hyperlipidemia: Secondary | ICD-10-CM

## 2023-04-11 DIAGNOSIS — Z79899 Other long term (current) drug therapy: Secondary | ICD-10-CM | POA: Diagnosis not present

## 2023-04-11 DIAGNOSIS — E039 Hypothyroidism, unspecified: Secondary | ICD-10-CM

## 2023-04-11 NOTE — Progress Notes (Signed)
Future Appointments  Date Time Provider Department  04/11/2023                         6 mo   2:30 PM Lucky Cowboy, MD [facility]  05/12/2023  1:00 PM Arfeen, Phillips Grout, MD [facility]  10/06/2023                          cpe  3:00 PM Lucky Cowboy, MD [facility]  01/09/2024                          wellness 11:00 AM Adela Glimpse, NP [facility]    History of Present Illness:       This very nice 67 y.o. MWF presents for 6 month follow up with HTN, HLD, Hypothyroidism,  Pre-Diabetes and Vitamin D Deficiency.  Patient has GERD controlled on her Pantoprazole. She also has a Major Depressive Disorder & panic Attacks & is followed by Dr Kathryne Sharper.        Patient  is on SS Disability since [address]  for a Chronic Pain Syndrome consequent  to Cx DDD/HNP  (Cx surgery in 2011 and a Lumbar surgery  L5/S1 in 2013 at  by Dr Danielle Dess and 3rd surgery - Lumbar Fusion in Nov 2016).  She reports chronic intermittent RLE pain.       Patient is treated for HTN  (2007) & BP has been controlled at home. Today's BP was initially elevated & rechecked at goal - 128/80. Patient has had no complaints of any cardiac type chest pain, palpitations, dyspnea / orthopnea / PND, dizziness, claudication, or dependent edema.       Hyperlipidemia is controlled with diet & Crestor.   Patient denies myalgias or other med SE's. Last Lipids were at goal :  Lab Results  Component Value Date   CHOL 152 01/06/2023   HDL 52 01/06/2023   LDLCALC 80 01/06/2023   TRIG 105 01/06/2023   CHOLHDL 2.9 01/06/2023     Also, the patient has history of PreDiabetes (A1c 5.9% /2009 & 6.3% /2014) and has had no symptoms of reactive hypoglycemia, diabetic polys, paresthesias or visual blurring.  Last A1c was near goal:  Lab Results  Component Value Date   HGBA1C 5.6 01/06/2023   Wt Readings from Last 3 Encounters:  04/11/23 104 lb 3.2 oz (47.3 kg)  04/05/23 102 lb 6.4 oz (46.4 kg)  01/06/23 106 lb 3.2 oz (48.2 kg)                                            Patient was  dx'd Hypothyroid in 2009 & initiated on thyroid replacement.       Further, the patient also has history of Vitamin D Deficiency ("9" /2009) and supplements vitamin D without any suspected side-effects. Last vitamin D was at goal:  Lab Results  Component Value Date   VD25OH 97 01/06/2023        Current Outpatient Medications on File Prior to Visit  Medication Sig   albuterol  HFA  inhaler Inhale 2 puffs every 6 hours as needed for wheezing    amitriptyline 25 MG tablet Take 1 tablet  at bedtime.   cetirizine  10 MG tablet Take 1 tablet daily.   VITAMIN  D  4,000 Units Take daily.    CINNAMON 1,000 mg Take  2 times daily.    clonazePAM ( 0.5 MG tablet Take 1/2 to 1 tablet as needed    diclofenac  1 % GEL Apply 4 g topically 4  times daily.   DULoxetine  40 MG  Take  daily.   fFLONASE nasal spray 2 SPRAYS IN EACH NOSTRIL EVERY EVENING   ADVAIR HFA 115-21  inhaler INHALE 2 PUFFS   EVERY 12 HRS   ULTRAVATE 0.05 % oint    lamoTRIgine  25 MG tablet Take 1 tablet (25 mg total) by mouth 2 (two) times daily.   levothyroxine 50 MCG tablet Take 1/2 tablet  Daily    losartan \ 50 MG tablet TAKE 1 TABLET \\DAILYFOR  BLOOD PRESSURE   Magnesium 250 MG TABS Take 3 tablets by mouth daily.    meloxicam (MOBIC) 15 MG tablet Take  1 tablet  Daily     montelukast  10 MG tablet Take  1 tablet  Daily     MYCOSTATIN suspension SWISH AND SWALLOW 5 ML 4 x/ DAILY    ondansetron  4 MG tablet Take 1 tablet  every 8 hours as needed.   pantoprazole 40 MG tablet Take  1 tablet Daily    pregabalin50 MG capsule Take  at bedtime.   PREMARIN vaginal cream INSERTVAGINALLY TWICE A WEEK   NURTEC 75 MG TBDP Take 75 mg by mouth as needed (migraine).   rosuvastatin  40 MG tablet Take  1 tablet  2 x /week for Cholesterol    Allergies  Allergen Reactions   Demerol [Meperidine] Other (See Comments)    REACTION: unknown--itching   Epinephrine Other (See Comments)     "Sensitivity."   Niacin And Related Rash    PMHx:   Past Medical History:  Diagnosis Date   Asthma    Dulera daily   Chronic back pain    HNP   Complication of anesthesia    slow to wake up   Depression    takes Clonazepam nightly   GERD (gastroesophageal reflux disease)    takes Protonix daily    Heart murmur    History of migraine    takes Relpax daily as needed   HSV (herpes simplex virus) infection    Hyperlipidemia    takes Atorvastatin daily   Hypertension    takes Benicar daily   Hypothyroidism    takes Synthroid daily   MVP (mitral valve prolapse)    Pneumonia    hx of--been many yrs ago   Seasonal allergies    takes Allegra daily   Shortness of breath    with exertion    Immunization History  Administered Date(s) Administered   Influenza Inj Mdck Quad Pf 08/27/2019   Influenza Inj Mdck Quad  09/14/2017, 10/19/2018, 09/24/2020   Influenza,inj,quad,  11/09/2016   Influenza- 09/18/2013   Moderna Sars-Covid-2 Vacc 02/15/2020, 03/18/2020, 11/10/2020   PPD Test 04/17/2014   Pneumococcal-23 11/30/1995   Td 08/27/2005, 05/05/2016     Past Surgical History:  Procedure Laterality Date   ABLATION  2011   Uterine   buttocks surgery     at age 24 (coccyx repair)   CERVICAL FUSION  03/2010   CESAREAN SECTION  1992   CHOLECYSTECTOMY  2008   COLONOSCOPY     DILATION AND CURETTAGE OF UTERUS  2011   ESOPHAGOGASTRODUODENOSCOPY     LUMBAR LAMINECTOMY/DECOMPRESSION MICRODISCECTOMY  10/24/2012   Procedure: LUMBAR LAMINECTOMY/DECOMPRESSION MICRODISCECTOMY 1 LEVEL;  Surgeon: Barnett Abu, MD;  Location: [facility] NEURO ORS;  Service: Neurosurgery;  Laterality: Right;  Right Lumbar five-Sacral One Microdiskectomy   right knee arthroscopy     x 3   TONSILLECTOMY     at age 27    FHx:    Reviewed / unchanged  SHx:    Reviewed / unchanged   Systems Review:  Constitutional: Denies fever, chills, wt changes, headaches, insomnia, fatigue, night sweats, change in  appetite. Eyes: Denies redness, blurred vision, diplopia, discharge, itchy, watery eyes.  ENT: Denies discharge, congestion, post nasal drip, epistaxis, sore throat, earache, hearing loss, dental pain, tinnitus, vertigo, sinus pain, snoring.  CV: Denies chest pain, palpitations, irregular heartbeat, syncope, dyspnea, diaphoresis, orthopnea, PND, claudication or edema. Respiratory: denies cough, dyspnea, DOE, pleurisy, hoarseness, laryngitis, wheezing.  Gastrointestinal: Denies dysphagia, odynophagia, heartburn, reflux, water brash, abdominal pain or cramps, nausea, vomiting, bloating, diarrhea, constipation, hematemesis, melena, hematochezia  or hemorrhoids. Genitourinary: Denies dysuria, frequency, urgency, nocturia, hesitancy, discharge, hematuria or flank pain. Musculoskeletal: Denies arthralgias, myalgias, stiffness, jt. swelling, pain, limping or strain/sprain.  Skin: Denies pruritus, rash, hives, warts, acne, eczema or change in skin lesion(s). Neuro: No weakness, tremor, incoordination, spasms, paresthesia or pain. Psychiatric: Denies confusion, memory loss or sensory loss. Endo: Denies change in weight, skin or hair change.  Heme/Lymph: No excessive bleeding, bruising or enlarged lymph nodes.  Physical Exam  BP 128/80   Pulse 93   Temp 97.9 F (36.6 C)   Resp 16   Ht 5' 1.5" (1.562 m)   Wt 104 lb 3.2 oz (47.3 kg)   SpO2 99%   BMI 19.37 kg/m   Appears  well nourished, well groomed  and in no distress.  Eyes: PERRLA, EOMs, conjunctiva no swelling or erythema. Sinuses: No frontal/maxillary tenderness ENT/Mouth: EAC's clear, TM's nl w/o erythema, bulging. Nares clear w/o erythema, swelling, exudates. Oropharynx clear without erythema or exudates. Oral hygiene is good. Tongue normal, non obstructing. Hearing intact.  Neck: Supple. Thyroid not palpable. Car 2+/2+ without bruits, nodes or JVD. Chest: Respirations nl with BS clear & equal w/o rales, rhonchi, wheezing or stridor.   Cor: Heart sounds normal w/ regular rate and rhythm without sig. murmurs, gallops, clicks or rubs. Peripheral pulses normal and equal  without edema.  Abdomen: Soft & bowel sounds normal. Non-tender w/o guarding, rebound, hernias, masses or organomegaly.  Lymphatics: Unremarkable.  Musculoskeletal: Full ROM all peripheral extremities, joint stability, 5/5 strength and normal gait.  Skin: Warm, dry without exposed rashes, lesions or ecchymosis apparent.  Neuro: Cranial nerves intact, reflexes equal bilaterally. Sensory-motor testing grossly intact. Tendon reflexes grossly intact.  Pysch: Alert & oriented x 3.  Insight and judgement nl & appropriate. No ideations.  Assessment and Plan:   1. Essential hypertension  - Continue medication, monitor blood pressure at home.  - Continue DASH diet.  Reminder to go to the ER if any CP,  SOB, nausea, dizziness, severe HA, changes vision/speech.  - CBC with Differential/Platelet - COMPLETE METABOLIC PANEL WITH GFR - Magnesium - TSH  2. Hyperlipidemia, mixed  - Continue diet/meds, exercise,& lifestyle modifications.  - Continue monitor periodic cholesterol/liver & renal functions   - Lipid panel - TSH  3. Abnormal glucose  - Hemoglobin A1c - Insulin, random  4. Vitamin D deficiency  - Continue diet, exercise  - Lifestyle modifications.  - Monitor appropriate labs. - Continue supplementation.  - VITAMIN D 25 Hydroxy  5. Hypothyroidism  - TSH  6. Gastroesophageal reflux disease  - CBC with  Differential/Platelet  7. Medication management  - CBC with Differential/Platelet - COMPLETE METABOLIC PANEL WITH GFR - Magnesium - Lipid panel - TSH - Hemoglobin A1c - Insulin, random - VITAMIN D 25 Hydroxy          Discussed  regular exercise, BP monitoring, weight control to achieve/maintain BMI less than 25 and discussed med and SE's. Recommended labs to assess and monitor clinical status with further disposition pending  results of labs.  I discussed the assessment and treatment plan with the patient. The patient was provided an opportunity to ask questions and all were answered. The patient agreed with the plan and demonstrated an understanding of the instructions.  I provided over 30 minutes of exam, counseling, chart review and  complex critical decision making.        The patient was advised to call back or seek an in-person evaluation if the symptoms worsen or if the condition fails to improve as anticipated.   Marinus Maw, MD

## 2023-04-11 NOTE — Patient Instructions (Signed)

## 2023-04-12 DIAGNOSIS — M8588 Other specified disorders of bone density and structure, other site: Secondary | ICD-10-CM | POA: Diagnosis not present

## 2023-04-12 DIAGNOSIS — M8589 Other specified disorders of bone density and structure, multiple sites: Secondary | ICD-10-CM | POA: Diagnosis not present

## 2023-04-12 DIAGNOSIS — M85852 Other specified disorders of bone density and structure, left thigh: Secondary | ICD-10-CM | POA: Diagnosis not present

## 2023-04-12 LAB — HEMOGLOBIN A1C
Hgb A1c MFr Bld: 5.6 % of total Hgb (ref <5.7)
Mean Plasma Glucose: 114 mg/dL
eAG (mmol/L): 6.3 mmol/L

## 2023-04-12 LAB — LIPID PANEL
Cholesterol: 146 mg/dL (ref <200)
HDL: 46 mg/dL — ABNORMAL LOW (ref >=50)
LDL Cholesterol (Calc): 78 mg/dL (calc)
Non-HDL Cholesterol (Calc): 100 mg/dL (calc) (ref <130)
Total CHOL/HDL Ratio: 3.2 (calc) (ref <5.0)
Triglycerides: 128 mg/dL (ref <150)

## 2023-04-12 LAB — COMPLETE METABOLIC PANEL WITH GFR
AG Ratio: 2.2 (calc) (ref 1.0–2.5)
ALT: 18 U/L (ref 6–29)
AST: 19 U/L (ref 10–35)
Albumin: 4.6 g/dL (ref 3.6–5.1)
Alkaline phosphatase (APISO): 50 U/L (ref 37–153)
BUN: 16 mg/dL (ref 7–25)
CO2: 33 mmol/L — ABNORMAL HIGH (ref 20–32)
Calcium: 9.5 mg/dL (ref 8.6–10.4)
Chloride: 101 mmol/L (ref 98–110)
Creat: 0.94 mg/dL (ref 0.50–1.05)
Globulin: 2.1 g/dL (calc) (ref 1.9–3.7)
Glucose, Bld: 89 mg/dL (ref 65–99)
Potassium: 4.5 mmol/L (ref 3.5–5.3)
Sodium: 141 mmol/L (ref 135–146)
Total Bilirubin: 0.5 mg/dL (ref 0.2–1.2)
Total Protein: 6.7 g/dL (ref 6.1–8.1)
eGFR: 67 mL/min/{1.73_m2} (ref >=60)

## 2023-04-12 LAB — CBC WITH DIFFERENTIAL/PLATELET
Absolute Monocytes: 514 cells/uL (ref 200–950)
Basophils Absolute: 33 cells/uL (ref 0–200)
Basophils Relative: 0.5 %
Eosinophils Absolute: 130 cells/uL (ref 15–500)
Eosinophils Relative: 2 %
HCT: 41.9 % (ref 35.0–45.0)
Hemoglobin: 14 g/dL (ref 11.7–15.5)
Lymphs Abs: 1677 cells/uL (ref 850–3900)
MCH: 32 pg (ref 27.0–33.0)
MCHC: 33.4 g/dL (ref 32.0–36.0)
MCV: 95.7 fL (ref 80.0–100.0)
MPV: 10.9 fL (ref 7.5–12.5)
Monocytes Relative: 7.9 %
Neutro Abs: 4147 cells/uL (ref 1500–7800)
Neutrophils Relative %: 63.8 %
Platelets: 269 10*3/uL (ref 140–400)
RBC: 4.38 10*6/uL (ref 3.80–5.10)
RDW: 11.2 % (ref 11.0–15.0)
Total Lymphocyte: 25.8 %
WBC: 6.5 10*3/uL (ref 3.8–10.8)

## 2023-04-12 LAB — HM DEXA SCAN

## 2023-04-12 LAB — VITAMIN D 25 HYDROXY (VIT D DEFICIENCY, FRACTURES): Vit D, 25-Hydroxy: 91 ng/mL (ref 30–100)

## 2023-04-12 LAB — MAGNESIUM: Magnesium: 2.1 mg/dL (ref 1.5–2.5)

## 2023-04-12 LAB — INSULIN, RANDOM: Insulin: 8.6 u[IU]/mL

## 2023-04-12 LAB — TSH: TSH: 1.19 mIU/L (ref 0.40–4.50)

## 2023-04-12 NOTE — Progress Notes (Signed)
^<^<^<^<^<^<^<^<^<^<^<^<^<^<^<^<^<^<^<^<^<^<^<^<^<^<^<^<^<^<^<^<^<^<^<^<^ ^>^>^>^>^>^>^>^>^>^>^>>^>^>^>^>^>^>^>^>^>^>^>^>^>^>^>^>^>^>^>^>^>^>^>^>^>  -  Test results slightly outside the reference range are not unusual. If there is anything important, I will review this with you,  otherwise it is considered normal test values.  If you have further questions,  please do not hesitate to contact me at the office or via My Chart.   ^<^<^<^<^<^<^<^<^<^<^<^<^<^<^<^<^<^<^<^<^<^<^<^<^<^<^<^<^<^<^<^<^<^<^<^<^ ^>^>^>^>^>^>^>^>^>^>^>^>^>^>^>^>^>^>^>^>^>^>^>^>^>^>^>^>^>^>^>^>^>^>^>^>^  -  Chol = 146  & LDL 78 - Both  Excellent   - Very low risk for Heart Attack  / Stroke ^>^>^>^>^>^>^>^>^>^>^>^>^>^>^>^>^>^>^>^>^>^>^>^>^>^>^>^>^>^>^>^>^>^>^>^>^  -  A1c = 5.6% - Normal  - No Diabetes  -   Great ! ^>^>^>^>^>^>^>^>^>^>^>^>^>^>^>^>^>^>^>^>^>^>^>^>^>^>^>^>^>^>^>^>^>^>^>^>^  -  Vit D = 91 - also   Excellent  - Please keep dosage same   ^>^>^>^>^>^>^>^>^>^>^>^>^>^>^>^>^>^>^>^>^>^>^>^>^>^>^>^>^>^>^>^>^>^>^>^>^  -  All Else - CBC - Kidneys - Electrolytes - Liver - Magnesium & Thyroid    - all  Normal / OK ^>^>^>^>^>^>^>^>^>^>^>^>^>^>^>^>^>^>^>^>^>^>^>^>^>^>^>^>^>^>^>^>^>^>^>^>^ ^>^>^>^>^>^>^>^>^>^>^>^>^>^>^>^>^>^>^>^>^>^>^>^>^>^>^>^>^>^>^>^>^>^>^>^>^  -  Keep up the [place] Work  !  ^>^>^>^>^>^>^>^>^>^>^>^>^>^>^>^>^>^>^>^>^>^>^>^>^>^>^>^>^>^>^>^>^>^>^>^>^ ^>^>^>^>^>^>^>^>^>^>^>^>^>^>^>^>^>^>^>^>^>^>^>^>^>^>^>^>^>^>^>^>^>^>^>^>^

## 2023-05-01 ENCOUNTER — Other Ambulatory Visit (HOSPITAL_COMMUNITY): Payer: Self-pay | Admitting: Psychiatry

## 2023-05-01 DIAGNOSIS — F331 Major depressive disorder, recurrent, moderate: Secondary | ICD-10-CM

## 2023-05-01 DIAGNOSIS — F411 Generalized anxiety disorder: Secondary | ICD-10-CM

## 2023-05-02 ENCOUNTER — Other Ambulatory Visit (HOSPITAL_COMMUNITY): Payer: Self-pay | Admitting: *Deleted

## 2023-05-02 DIAGNOSIS — F331 Major depressive disorder, recurrent, moderate: Secondary | ICD-10-CM

## 2023-05-02 DIAGNOSIS — F411 Generalized anxiety disorder: Secondary | ICD-10-CM

## 2023-05-02 MED ORDER — DULOXETINE HCL 20 MG PO CPEP
40.0000 mg | ORAL_CAPSULE | Freq: Every day | ORAL | 0 refills | Status: DC
Start: 2023-05-02 — End: 2023-05-12

## 2023-05-03 DIAGNOSIS — M8580 Other specified disorders of bone density and structure, unspecified site: Secondary | ICD-10-CM | POA: Diagnosis not present

## 2023-05-08 ENCOUNTER — Other Ambulatory Visit (HOSPITAL_COMMUNITY): Payer: Self-pay | Admitting: Psychiatry

## 2023-05-08 DIAGNOSIS — F331 Major depressive disorder, recurrent, moderate: Secondary | ICD-10-CM

## 2023-05-12 ENCOUNTER — Ambulatory Visit (HOSPITAL_COMMUNITY): Payer: PPO | Admitting: Psychiatry

## 2023-05-12 ENCOUNTER — Encounter (HOSPITAL_COMMUNITY): Payer: Self-pay | Admitting: Psychiatry

## 2023-05-12 DIAGNOSIS — F411 Generalized anxiety disorder: Secondary | ICD-10-CM | POA: Diagnosis not present

## 2023-05-12 DIAGNOSIS — F331 Major depressive disorder, recurrent, moderate: Secondary | ICD-10-CM

## 2023-05-12 MED ORDER — AMITRIPTYLINE HCL 25 MG PO TABS: 25.0000 mg | ORAL_TABLET | Freq: Every day | ORAL | 0 refills | Status: DC

## 2023-05-12 MED ORDER — CLONAZEPAM 0.5 MG PO TABS
ORAL_TABLET | ORAL | 1 refills | Status: DC
Start: 2023-05-12 — End: 2023-09-29

## 2023-05-12 MED ORDER — LAMOTRIGINE 25 MG PO TABS: 25.0000 mg | ORAL_TABLET | Freq: Two times a day (BID) | ORAL | 0 refills | Status: DC

## 2023-05-12 MED ORDER — DULOXETINE HCL 20 MG PO CPEP
40.0000 mg | ORAL_CAPSULE | Freq: Every day | ORAL | 2 refills | Status: AC
Start: 2023-05-12 — End: 2023-08-10

## 2023-05-12 NOTE — Progress Notes (Signed)
BH MD/PA/NP OP Progress Note   Patient Location: Office Provider Location: Office  05/12/2023 1:12 PM Sarah Rodriguez  MRN:  [id number]   HPI:  Patient came in for follow-up in person appointment.  She tried going up on amitriptyline but she could not tolerate.  We also tried Cymbalta to 60 mg and for the same reason she need to cut down the dose.  She feels dizzy, grogginess.  She is actually doing very well.  Recently she has a blood work with her primary care is okay.  She started walking and exercising and that is helping her mood.  She is excited as had a baby 2 months ago.  Her daughter lives in [place].  She is trying to see the baby at least once a month.  She is going in few weeks to see her again.  Patient overall feels medicine is working.  She denies any current spells, feeling of hopelessness or worthlessness.  She feels proud that she did drive when they were going to beach major panic attack.  She sleeps at least 7 to 8 hours.  She had a good support from her husband.  She denies drinking or using any illegal substances.   Visit Diagnosis:    ICD-10-CM   1. GAD (generalized anxiety disorder)  F41.1 clonazePAM (KLONOPIN) 0.5 MG tablet    DULoxetine (CYMBALTA) 20 MG capsule    amitriptyline (ELAVIL) 25 MG tablet    2. MDD (major depressive disorder), recurrent episode, moderate (HCC)  F33.1 clonazePAM (KLONOPIN) 0.5 MG tablet    DULoxetine (CYMBALTA) 20 MG capsule    lamoTRIgine (LAMICTAL) 25 MG tablet    amitriptyline (ELAVIL) 25 MG tablet      Past Psychiatric History:  H/O depression and taking antidepressants since 1996.  H/O panic attacks while driving.  Saw Dr. Emerson Monte and tried Prozac, Paxil, Zoloft, Xanax, Cymbalta and Klonopin.  No h/o inpatient treatment or any suicidal attempt.  Past Medical History:  Past Medical History:  Diagnosis Date   Asthma    Dulera daily   Chronic back pain    HNP   Complication of anesthesia    slow to wake up    Depression    takes Clonazepam nightly   GERD (gastroesophageal reflux disease)    takes Protonix daily    Heart murmur    History of migraine    takes Relpax daily as needed   HSV (herpes simplex virus) infection    Hyperlipidemia    takes Atorvastatin daily   Hypertension    takes Benicar daily   Hypothyroidism    takes Synthroid daily   MVP (mitral valve prolapse)    Pneumonia    hx of--been many yrs ago   Seasonal allergies    takes Allegra daily   Shortness of breath    with exertion    Past Surgical History:  Procedure Laterality Date   ABLATION  2011   Uterine   buttocks surgery     at age 19 (coccyx repair)   CERVICAL FUSION  03/2010   CESAREAN SECTION  1992   CHOLECYSTECTOMY  2008   COLONOSCOPY     DILATION AND CURETTAGE OF UTERUS  2011   ESOPHAGOGASTRODUODENOSCOPY     LUMBAR LAMINECTOMY/DECOMPRESSION MICRODISCECTOMY  10/24/2012   Procedure: LUMBAR LAMINECTOMY/DECOMPRESSION MICRODISCECTOMY 1 LEVEL;  Surgeon: Barnett Abu, MD;  Location: [facility];  Service: Neurosurgery;  Laterality: Right;  Right Lumbar five-Sacral One Microdiskectomy   right knee arthroscopy  x 3   TONSILLECTOMY     at age 65    Family Psychiatric History: Reviewed.  Family History:  Family History  Problem Relation Age of Onset   Arthritis Mother    Heart attack Mother    Depression Mother    Allergies Mother    Hypertension Mother    Heart attack Father     Social History:  Social History   Socioeconomic History   Marital status: Married    Spouse name: Not on file   Number of children: Not on file   Years of education: Not on file   Highest education level: Not on file  Occupational History   Not on file  Tobacco Use   Smoking status: Former   Smokeless tobacco: Never   Tobacco comments:    quit at age 37  Vaping Use   Vaping Use: Never used  Substance and Sexual Activity   Alcohol use: No    Comment: 2-3 times a yr   Drug use: No   Sexual activity: Yes     Birth control/protection: Post-menopausal  Other Topics Concern   Not on file  Social History Narrative   Not on file   Social Determinants of Health   Financial Resource Strain: Not on file  Food Insecurity: Not on file  Transportation Needs: Not on file  Physical Activity: Not on file  Stress: Not on file  Social Connections: Not on file    Allergies:  Allergies  Allergen Reactions   Demerol [Meperidine] Other (See Comments)    REACTION: unknown--itching   Epinephrine Other (See Comments)    "Sensitivity."   Niacin And Related Rash    Metabolic Disorder Labs: Lab Results  Component Value Date   HGBA1C 5.6 04/11/2023   MPG 114 04/11/2023   MPG 114 01/06/2023   No results found for: "PROLACTIN" Lab Results  Component Value Date   CHOL 146 04/11/2023   TRIG 128 04/11/2023   HDL 46 (L) 04/11/2023   CHOLHDL 3.2 04/11/2023   VLDL 31 (H) 06/06/2017   LDLCALC 78 04/11/2023   LDLCALC 80 01/06/2023   Lab Results  Component Value Date   TSH 1.19 04/11/2023   TSH 1.45 01/06/2023    Therapeutic Level Labs: No results found for: "LITHIUM" No results found for: "VALPROATE" No results found for: "CBMZ"  Current Medications: Current Outpatient Medications  Medication Sig Dispense Refill   albuterol (PROAIR HFA) 108 (90 Base) MCG/ACT inhaler Inhale 2 puffs into the lungs every 6 (six) hours as needed for wheezing or shortness of breath. 8 g 1   amitriptyline (ELAVIL) 25 MG tablet Take 1 tablet (25 mg total) by mouth at bedtime. 90 tablet 0   Cholecalciferol (VITAMIN D PO) Take 4,000 Units by mouth daily.      CINNAMON PO Take 1,000 Units by mouth 2 (two) times daily.      clonazePAM (KLONOPIN) 0.5 MG tablet Take 1/2 to 1 tablet as needed for severe anxiety. 30 tablet 1   Cyanocobalamin (B-12 PO) Take by mouth.     Cyanocobalamin (B-12) 2500 MCG TABS Take by mouth.     DULoxetine (CYMBALTA) 20 MG capsule Take 2 capsules (40 mg total) by mouth daily. 60 capsule 0    fluticasone (FLONASE) 50 MCG/ACT nasal spray SHAKE LIQUID AND USE 2 SPRAYS IN EACH NOSTRIL EVERY EVENING 48 g 3   fluticasone-salmeterol (ADVAIR HFA) 115-21 MCG/ACT inhaler Use  2 inhalations  15 minutes apart  2 x /day (every  12 hours) 36 g 3   lamoTRIgine (LAMICTAL) 25 MG tablet Take 1 tablet (25 mg total) by mouth 2 (two) times daily. 180 tablet 0   levothyroxine (SYNTHROID) 25 MCG tablet Take  1 tablet  Daily  on an empty stomach with only water for 30 minutes & no Antacid meds, Calcium or Magnesium for 4 hours & avoid Biotin  (  Dose change from 50 to 25 mcg ) 90 tablet 1   losartan (COZAAR) 50 MG tablet Take  1 tablet  Daily  for BP                                                                               /                  TAKE BY MOUTH 90 tablet 3   Magnesium 250 MG TABS Take 3 tablets by mouth daily.      meloxicam (MOBIC) 15 MG tablet Take  1 tablet Daily with Food for Pain & Inflammation                                            /                      TAKE                   BY                      MOUTH 90 tablet 3   montelukast (SINGULAIR) 10 MG tablet Take 1 tablet Daily for Allergies                                                                              /                                                                          TAKE                                        BY                                  MOUTH 90 tablet 3   nystatin (MYCOSTATIN) 100000 UNIT/ML suspension SWISH AND SWALLOW 5 ML BY MOUTH FOUR TIMES DAILY FOR THRUSH  OR YEAST INFECTION 480 mL 1   pantoprazole (PROTONIX) 40 MG tablet TAKE 1 TABLET BY MOUTH DAILY 90 tablet 3   PREMARIN vaginal cream INSERT 1/2 GRAM VAGINALLY VIA APPLICATOR NIGHTLY FOR W EEKS THEN JUST DO TEICE A WEEK  11   promethazine-dextromethorphan (PROMETHAZINE-DM) 6.25-15 MG/5ML syrup Take 2.5 mLs by mouth 4 (four) times daily as needed for cough. (Patient not taking: Reported on 04/11/2023) 240 mL 0   rosuvastatin (CRESTOR) 40 MG tablet Take  1  tablet  2 x /week  for Cholesterol                                                      /                                 TAKE                  BY                MOUTH 26 tablet 3   Spacer/Aero-Holding Chambers (AEROCHAMBER MV) inhaler Use as instructed with inhaler. 1 each 0   No current facility-administered medications for this visit.   Screenings: GAD-7    Advertising copywriter from 05/18/2022 in [facility] from 05/07/2021 in [facility]  Total GAD-7 Score 5 8      PHQ2-9    Flowsheet Row Office Visit from 01/06/2023 in [place] [facility] Office Visit from 09/30/2022 in [place] [facility] Counselor from 05/18/2022 in [facility] Visit from 01/06/2022 in [place] [facility] Office Visit from 09/30/2021 in [place] [facility]  PHQ-2 Total Score 1 0 2 1 0  PHQ-9 Total Score -- -- 6 -- --      Flowsheet Row Counselor from 05/18/2022 in [place] [facility] from 05/07/2021 in [place] [facility] Video Visit from 05/05/2021 in [facility]  C-SSRS RISK CATEGORY No Risk No Risk No Risk     Psychiatric Specialty Exam: Physical Exam  Review of Systems  HENT:  Positive for hearing loss.     Weight 104 lb (47.2 kg).Body mass index is 19.33 kg/m.  General Appearance: Casual  Eye Contact:  Good  Speech:  Normal Rate  Volume:  Normal  Mood:  Euthymic  Affect:  Congruent  Thought Process:  Goal Directed  Orientation:  Full (Time, Place, and Person)  Thought Content:  Logical  Suicidal Thoughts:  No  Homicidal Thoughts:  No  Memory:  Immediate;   Good Recent;   Good Remote;   Good  Judgement:  Good  Insight:  Present   Psychomotor Activity:  Normal  Concentration:  Concentration: Good and Attention Span: Good  Recall:  Good  Fund of Knowledge:  Good  Language:  Good  Akathisia:  No  Handed:  Right  AIMS (if indicated):     Assets:  Communication Skills Desire for Improvement Housing Resilience Social Support Transportation  ADL's:  Intact  Cognition:  WNL7-8 hrs  Sleep:       Assessment and Plan:  Major depressive disorder,  recurrent.  Anxiety.  Patient is doing better current medication.  She has no more dizziness.  I reviewed blood work results.  Labs are normal.  She started exercising and encourage to walk and if she feels dizzy then she may need to consider treadmill so she can hold the machine.  She is disappointed because could not be a candidate for cough..  She tried again Cymbalta 60 mg but could not handle.  We discussed to keep the current medicine since symptoms are stable.  Continue Cymbalta 20 mg 2 capsules daily, amitriptyline 25 mg at bedtime, 25 mg daily twice a day and Klonopin one fourth as needed for severe anxiety.  Patient is in the process of finding a new therapist.  Referral given.  Recommend to call us back if she has any question or any concern.  Follow-up in 3 months.  Collaboration of Care: Collaboration of Care: Primary Care Provider AEB notes are available in epic to review.  Patient/Guardian was advised Release of Information must be obtained prior to any record release in order to collaborate their care with an outside provider. Patient/Guardian was advised if they have not already done so to contact the registration department to sign all necessary forms in order for [place] to release information regarding their care.   Consent: Patient/Guardian gives verbal consent for treatment and assignment of benefits for services provided during this visit. Patient/Guardian expressed understanding and agreed to proceed.    Cleotis Nipper, MD 05/12/2023, 1:12 PM

## 2023-07-13 ENCOUNTER — Encounter: Payer: Self-pay | Admitting: Nurse Practitioner

## 2023-07-13 ENCOUNTER — Ambulatory Visit (INDEPENDENT_AMBULATORY_CARE_PROVIDER_SITE_OTHER): Payer: PPO | Admitting: Nurse Practitioner

## 2023-07-13 VITALS — BP 140/82 | HR 83 | Temp 98.0°F | Ht 61.5 in | Wt 108.8 lb

## 2023-07-13 DIAGNOSIS — G43109 Migraine with aura, not intractable, without status migrainosus: Secondary | ICD-10-CM

## 2023-07-13 DIAGNOSIS — K219 Gastro-esophageal reflux disease without esophagitis: Secondary | ICD-10-CM

## 2023-07-13 DIAGNOSIS — M159 Polyosteoarthritis, unspecified: Secondary | ICD-10-CM

## 2023-07-13 DIAGNOSIS — Z0001 Encounter for general adult medical examination with abnormal findings: Secondary | ICD-10-CM

## 2023-07-13 DIAGNOSIS — J45909 Unspecified asthma, uncomplicated: Secondary | ICD-10-CM

## 2023-07-13 DIAGNOSIS — R6889 Other general symptoms and signs: Secondary | ICD-10-CM | POA: Diagnosis not present

## 2023-07-13 DIAGNOSIS — E039 Hypothyroidism, unspecified: Secondary | ICD-10-CM

## 2023-07-13 DIAGNOSIS — E782 Mixed hyperlipidemia: Secondary | ICD-10-CM | POA: Diagnosis not present

## 2023-07-13 DIAGNOSIS — Z79899 Other long term (current) drug therapy: Secondary | ICD-10-CM

## 2023-07-13 DIAGNOSIS — R7309 Other abnormal glucose: Secondary | ICD-10-CM

## 2023-07-13 DIAGNOSIS — I10 Essential (primary) hypertension: Secondary | ICD-10-CM

## 2023-07-13 DIAGNOSIS — E559 Vitamin D deficiency, unspecified: Secondary | ICD-10-CM

## 2023-07-13 DIAGNOSIS — G8929 Other chronic pain: Secondary | ICD-10-CM

## 2023-07-13 DIAGNOSIS — G4730 Sleep apnea, unspecified: Secondary | ICD-10-CM

## 2023-07-13 DIAGNOSIS — M4316 Spondylolisthesis, lumbar region: Secondary | ICD-10-CM

## 2023-07-13 DIAGNOSIS — Z6821 Body mass index (BMI) 21.0-21.9, adult: Secondary | ICD-10-CM

## 2023-07-13 DIAGNOSIS — F3341 Major depressive disorder, recurrent, in partial remission: Secondary | ICD-10-CM

## 2023-07-13 NOTE — Progress Notes (Signed)
FOLLOW UP  Migraine with aura and without status migrainosus, not intractable Well managed by PRN OTC analgesic and phenergan, amitriptyline  Report to ER if there is weakness, thunderclap headache, visual changes, or any concerning factors  Essential hypertension Continue Losartan Discussed DASH (Dietary Approaches to Stop Hypertension) DASH diet is lower in sodium than a typical American diet. Cut back on foods that are high in saturated fat, cholesterol, and trans fats. Eat more whole-grain foods, fish, poultry, and nuts Remain active and exercise as tolerated daily.  Monitor BP at home-Call if greater than 130/80.  Check CMP/CBC  Uncomplicated asthma, unspecified asthma severity, unspecified whether persistent Controlled Continue Albuterol, Flonase, Advair, Montelukast Avoid triggers Continue to monitor  Gastroesophageal reflux disease, esophagitis presence not specified Continue Pantoprazole No suspected reflux complications (Barret/stricture). Lifestyle modification:  wt loss, avoid meals 2-3h before bedtime. Consider eliminating food triggers:  chocolate, caffeine, EtOH, acid/spicy food.  Hypothyroidism, unspecified typ Controlled. Continue Levothyroxine. Reminded to take on an empty stomach 30-20mins before food.  Stop any Biotin Supplement 48-72 hours before next TSH level to reduce the risk of falsely low TSH levels. Continue to monitor.    Spondylolisthesis at L4-L5 level S/p fusion for repair - no recent flare Followed by Dr. Langston Masker Continue to monitor  Primary osteoarthritis involving multiple joints Well managed by motrin/tylenol/lyrica Continue to monitor  Vitamin D deficiency Continue supplement - goal 60-100 Monitor levels  Other abnormal glucose (hx of prediabetes) Education: Reviewed 'ABCs' of diabetes management  Discussed goals to be met and/or maintained include A1C (<7) Blood pressure (<130/80) Cholesterol (LDL <70) Continue Eye Exam yearly   Continue Dental Exam Q6 mo Discussed dietary recommendations Discussed Physical Activity recommendations Check A1C  Medication management All medications discussed and reviewed in full. All questions and concerns regarding medications addressed.    Hyperlipidemia, mixed Continue Rosuvastatin Discussed lifestyle modifications. Recommended diet heavy in fruits and veggies, omega 3's. Decrease consumption of animal meats, cheeses, and dairy products. Remain active and exercise as tolerated. Continue to monitor. Check lipids/TSH  Depression, major, recurrent, in partial remission (HCC) Followed by Dr. Lolly Mustache Continue medications, Lamotrigine, Duloxetine, Clonazepam, Amitriptyline Limit benzo use to <5 days a week if possible to avoid dependence  Lifestyle discussed: diet/exerise, sleep hygiene, stress management, hydration  Chronic back pain, unspecified back location, unspecified back pain laterality Continue follow up with Dr. Danielle Dess Continue to monitor  Sleep apnea Controlled Continue oral device  BMI 21 Discussed appropriate BMI Diet modification. Physical activity. Encouraged/praised to build confidence.  Orders Placed This Encounter  Procedures   CBC with Differential/Platelet   COMPLETE METABOLIC PANEL WITH GFR   Lipid panel   Hemoglobin A1c   Notify office for further evaluation and treatment, questions or concerns if any reported s/s fail to improve.   The patient was advised to call back or seek an in-person evaluation if any symptoms worsen or if the condition fails to improve as anticipated.   Further disposition pending results of labs. Discussed med's effects and SE's.    I discussed the assessment and treatment plan with the patient. The patient was provided an opportunity to ask questions and all were answered. The patient agreed with the plan and demonstrated an understanding of the instructions.  Discussed med's effects and SE's. Screening labs and  tests as requested with regular follow-up as recommended.  I provided 35 minutes of face-to-face time during this encounter including counseling, chart review, and critical decision making was preformed.  Future Appointments  Date Time Provider  Department Center  08/11/2023  3:00 PM Arfeen, Phillips Grout, MD [facility] None  10/13/2023  2:00 PM Adela Glimpse, NP [facility] None  01/16/2024  2:30 PM Adela Glimpse, NP [facility] None     Plan:   During the course of the visit the patient was educated and counseled about appropriate screening and preventive services including:   Pneumococcal vaccine  Prevnar 13 Influenza vaccine Td vaccine Screening electrocardiogram Bone densitometry screening Colorectal cancer screening Diabetes screening Glaucoma screening Nutrition counseling  Advanced directives: requested   Subjective:  Sarah Rodriguez is a 67 y.o. female who presents for Medicare Annual Wellness Visit and 3 month follow up. Patient has GERD controlled on her meds.   Overall she reports feeling well today.    She was referred to Dr. Irene Limbo who found a mild sleep apnea and was recommended an oral device and husband has noted improved snoring. Reports significant improvement.  Sleep well and feeling well rested.   She also has Chronic Pain Syndrome consequent of Cx DDD and Cx HNP surg in 2011 and then in 2013, she underwent L5/S1 Surg by Dr Danielle Dess and lastly in Nov 2016, she had a Lumbar fusion. She is able to control her pain on extra strength tylenol, has lyrica with limited benefit, avoided opioids.  She has hx of migraines; reports rare, typically more sinus. Does well with 1/2 tab excedrine.    She has history of asthma/allergies, states consistently has cough. She has had some sinus congestion/left side worse. Has seen ENT, flonase recommended.    she has a diagnosis of depression, follows with Dr. Lolly Mustache currently fairly controlled on duloxetine, lamictal and  amitryptiline, and currently takes klonopin 0.25-0.5 mg BID PRN anxiety and insomnia, reports symptoms are well controlled on current regimen. she takes 0.125 mg of klonopin at night for sleep, uses rarely during the day for anxiety.   Hx of GERD on protonix 40 mg daily, failed taper by GI. Colonoscopy UTD 01/2021.  Recall 10 years. Due 01/2031  BMI is Body mass index is 20.22 kg/m., she admits needs to do better on diet and exercise.  Wt Readings from Last 3 Encounters:  07/13/23 108 lb 12.8 oz (49.4 kg)  04/11/23 104 lb 3.2 oz (47.3 kg)  04/05/23 102 lb 6.4 oz (46.4 kg)   Her blood pressure has been controlled at home, today their BP is BP: (!) 140/82 She does workout. She denies chest pain, shortness of breath, dizziness.   She is on cholesterol medication (rosuvastatin 40 mg Tues, Thurs, was having cramping with higher doses) and denies myalgias. Her cholesterol is not at goal. The cholesterol last visit was:   Lab Results  Component Value Date   CHOL 146 04/11/2023   HDL 46 (L) 04/11/2023   LDLCALC 78 04/11/2023   TRIG 128 04/11/2023   CHOLHDL 3.2 04/11/2023    She has been working on diet and exercise for prediabetes, and denies foot ulcerations, increased appetite, nausea, polydipsia, polyuria, visual disturbances, vomiting and weight loss. Last A1C in the office was:  Lab Results  Component Value Date   HGBA1C 5.6 04/11/2023   She is on thyroid medication. Her medication was not changed last visit. Taking 1/2 tab 50 mcg daily.  Lab Results  Component Value Date   TSH 1.19 04/11/2023   Last GFR: Lab Results  Component Value Date   GFRNONAA 67 04/16/2021   Patient is on Vitamin D supplement, taking 4000 IU daily, did reduce after last visit Lab  Results  Component Value Date   VD25OH 91 04/11/2023       Medication Review: Current Outpatient Medications on File Prior to Visit  Medication Sig Dispense Refill   albuterol (PROAIR HFA) 108 (90 Base) MCG/ACT inhaler  Inhale 2 puffs into the lungs every 6 (six) hours as needed for wheezing or shortness of breath. 8 g 1   amitriptyline (ELAVIL) 25 MG tablet Take 1 tablet (25 mg total) by mouth at bedtime. 90 tablet 0   Cholecalciferol (VITAMIN D PO) Take 4,000 Units by mouth daily.      CINNAMON PO Take 1,000 Units by mouth 2 (two) times daily.      clonazePAM (KLONOPIN) 0.5 MG tablet Take 1/2 to 1 tablet as needed for severe anxiety. 30 tablet 1   Cyanocobalamin (B-12 PO) Take by mouth.     Cyanocobalamin (B-12) 2500 MCG TABS Take by mouth.     DULoxetine (CYMBALTA) 20 MG capsule Take 2 capsules (40 mg total) by mouth daily. 60 capsule 2   fluticasone (FLONASE) 50 MCG/ACT nasal spray SHAKE LIQUID AND USE 2 SPRAYS IN EACH NOSTRIL EVERY EVENING 48 g 3   fluticasone-salmeterol (ADVAIR HFA) 115-21 MCG/ACT inhaler Use  2 inhalations  15 minutes apart  2 x /day (every 12 hours) 36 g 3   lamoTRIgine (LAMICTAL) 25 MG tablet Take 1 tablet (25 mg total) by mouth 2 (two) times daily. 180 tablet 0   levothyroxine (SYNTHROID) 25 MCG tablet Take  1 tablet  Daily  on an empty stomach with only water for 30 minutes & no Antacid meds, Calcium or Magnesium for 4 hours & avoid Biotin  (  Dose change from 50 to 25 mcg ) 90 tablet 1   losartan (COZAAR) 50 MG tablet Take  1 tablet  Daily  for BP                                                                               /                  TAKE BY MOUTH 90 tablet 3   Magnesium 250 MG TABS Take 3 tablets by mouth daily.      meloxicam (MOBIC) 15 MG tablet Take  1 tablet Daily with Food for Pain & Inflammation                                            /                      TAKE                   BY                      MOUTH 90 tablet 3   montelukast (SINGULAIR) 10 MG tablet Take 1 tablet Daily for Allergies                                                                              /  TAKE                                         BY                                  MOUTH 90 tablet 3   nystatin (MYCOSTATIN) 100000 UNIT/ML suspension SWISH AND SWALLOW 5 ML BY MOUTH FOUR TIMES DAILY FOR THRUSH OR YEAST INFECTION 480 mL 1   pantoprazole (PROTONIX) 40 MG tablet TAKE 1 TABLET BY MOUTH DAILY 90 tablet 3   PREMARIN vaginal cream INSERT 1/2 GRAM VAGINALLY VIA APPLICATOR NIGHTLY FOR W EEKS THEN JUST DO TEICE A WEEK  11   promethazine-dextromethorphan (PROMETHAZINE-DM) 6.25-15 MG/5ML syrup Take 2.5 mLs by mouth 4 (four) times daily as needed for cough. 240 mL 0   rosuvastatin (CRESTOR) 40 MG tablet Take  1 tablet  2 x /week  for Cholesterol                                                      /                                 TAKE                  BY                MOUTH 26 tablet 3   Spacer/Aero-Holding Chambers (AEROCHAMBER MV) inhaler Use as instructed with inhaler. 1 each 0   No current facility-administered medications on file prior to visit.    Allergies  Allergen Reactions   Demerol [Meperidine] Other (See Comments)    REACTION: unknown--itching   Epinephrine Other (See Comments)    "Sensitivity."   Niacin And Related Rash    Current Problems (verified) Patient Active Problem List   Diagnosis Date Noted   Sleep apnea 06/12/2020   Deviated septum 03/14/2018   Chronic back pain 12/21/2017   Depression, major, recurrent, in partial remission (HCC) 08/05/2016   Degenerative joint disease 08/05/2016   Migraine 03/03/2016   Spondylolisthesis at L4-L5 level 10/27/2015   Vitamin D deficiency 04/17/2014   Medication management 04/17/2014   Hyperlipidemia, mixed    Hypertension    Asthma    GERD (gastroesophageal reflux disease)    Hypothyroidism     Screening Tests Immunization History  Administered Date(s) Administered   Influenza Inj Mdck Quad Pf 08/27/2019   Influenza Inj Mdck Quad With Preservative 09/14/2017, 10/19/2018, 09/24/2020   Influenza, High Dose Seasonal PF 09/08/2022    Influenza,inj,Quad PF,6+ Mos 10/06/2021   Influenza,inj,quad, With Preservative 11/09/2016   Influenza-Unspecified 09/18/2013   Moderna Sars-Covid-2 Vaccination 02/15/2020, 03/18/2020, 11/10/2020   PNEUMOCOCCAL CONJUGATE-20 09/08/2022   PPD Test 04/17/2014   Pneumococcal-Unspecified 11/30/1995   Td 08/27/2005, 05/05/2016   Health Maintenance  Topic Date Due   Zoster Vaccines- Shingrix (1 of 2) Never done   DEXA SCAN  Never done   COVID-19 Vaccine (4 - 2023-24 season) 07/30/2022   MAMMOGRAM  03/12/2023   INFLUENZA VACCINE  06/30/2023   Medicare Annual Wellness (AWV)  01/07/2024   DTaP/Tdap/Td (  3 - Tdap) 05/05/2026   Colonoscopy  02/26/2031   Pneumonia Vaccine 17+ Years old  Completed   Hepatitis C Screening  Completed   HPV VACCINES  Aged Out   Last colonoscopy: 02/25/2022 normal, 10 year recall  Last mammogram: annually at Dr. Algie Coffer, 03/2023 Last pap smear/pelvic exam: at GYN, following annually DEXA: 03/2023  Shingles/Zostavax: declines due to cost   Names of Other Physician/Practitioners you currently use: 1. [facility]- here for primary care 2. Dr. [place], eye doctor, last visit 2024, wears glasses.  3. Dr. Aundria Rud, dentist, last visit 2024  Patient Care Team: Lucky Cowboy, MD as PCP - General (Internal Medicine) Barnett Abu, MD as Consulting Physician (Neurosurgery) Cleotis Nipper, MD as Consulting Physician (Psychiatry)  SURGICAL HISTORY She  has a past surgical history that includes Dilation and curettage of uterus (2011); Ablation (2011); Cholecystectomy (2008); Tonsillectomy; buttocks surgery; Cesarean section (1992); right knee arthroscopy; Cervical fusion (03/2010); Colonoscopy; Esophagogastroduodenoscopy; and Lumbar laminectomy/decompression microdiscectomy (10/24/2012). FAMILY HISTORY Her family history includes Allergies in her mother; Arthritis in her mother; Depression in her mother; Heart attack in her father and  mother; Hypertension in her mother. SOCIAL HISTORY She  reports that she has quit smoking. She has never used smokeless tobacco. She reports that she does not drink alcohol and does not use drugs.  MEDICARE WELLNESS OBJECTIVES: Physical activity: Current Exercise Habits: Structured exercise class (PT) Cardiac risk factors:   Depression/mood screen:      07/13/2023    3:16 PM  Depression screen PHQ 2/9  Decreased Interest 0  Down, Depressed, Hopeless 1  PHQ - 2 Score 1    ADLs:     07/13/2023    3:17 PM 01/06/2023   11:41 AM  In your present state of health, do you have any difficulty performing the following activities:  Hearing? 0 1  Comment  Has hearing aides  Vision? 0 0  Comment  Sees Dr. Louanna Raw - wears Rx glasses  Difficulty concentrating or making decisions? 0 0  Walking or climbing stairs? 0 0  Dressing or bathing? 0 0  Doing errands, shopping? 0 0  Preparing Food and eating ?  N  Using the Toilet?  N  In the past six months, have you accidently leaked urine?  N  Do you have problems with loss of bowel control?  N  Managing your Medications?  N  Managing your Finances?  N  Housekeeping or managing your Housekeeping?  N     Cognitive Testing  Alert? Yes  Normal Appearance?Yes  Oriented to person? Yes  Place? Yes   Time? Yes  Recall of three objects?  Yes  Can perform simple calculations? Yes  Displays appropriate judgment?Yes  Can read the correct time from a watch face?Yes  EOL planning: Does Patient Have a Medical Advance Directive?: No   Review of Systems  Constitutional:  Negative for malaise/fatigue and weight loss.  HENT:  Positive for ear pain (R ear). Negative for congestion, ear discharge, hearing loss, sinus pain, sore throat and tinnitus.   Eyes:  Negative for blurred vision and double vision.  Respiratory:  Negative for cough, sputum production, shortness of breath and wheezing.   Cardiovascular:  Negative for chest pain, palpitations, orthopnea,  claudication, leg swelling and PND.  Gastrointestinal:  Negative for abdominal pain, blood in stool, constipation, diarrhea, heartburn, melena, nausea and vomiting.  Genitourinary: Negative.   Musculoskeletal:  Positive for back pain (intermittent, improved). Negative for falls, joint pain  and myalgias.  Skin:  Negative for rash.  Neurological:  Negative for dizziness, tingling, sensory change, weakness and headaches.  Endo/Heme/Allergies:  Negative for polydipsia.  Psychiatric/Behavioral:  Positive for depression (chronic, psych following). Negative for memory loss, substance abuse and suicidal ideas. The patient is not nervous/anxious and does not have insomnia.   All other systems reviewed and are negative.    Objective:     Today's Vitals   07/13/23 1436  BP: (!) 140/82  Pulse: 83  Temp: 98 F (36.7 C)  SpO2: 98%  Weight: 108 lb 12.8 oz (49.4 kg)  Height: 5' 1.5" (1.562 m)    Body mass index is 20.22 kg/m.  General appearance: alert, no distress, WD/WN, female HEENT: normocephalic, sclerae anicteric, TMs pearly, R mildly distended without purulence or erythema, nares patent, no discharge or erythema, dry/flaky skin in canals; Good dentition, + R TMJ Neck: supple, no lymphadenopathy, no thyromegaly, no masses Heart: RRR, normal S1, S2,  no murmur Lungs: CTA bilaterally, no wheezes, rhonchi, or rales Abdomen: +bs, soft, non tender, non distended, no masses, no hepatomegaly, no splenomegaly Musculoskeletal: nontender, no swelling, no obvious deformity Extremities: no edema, no cyanosis, no clubbing Pulses: 2+ symmetric, upper and lower extremities, normal cap refill Neurological: alert, oriented x 3, CN2-12 intact, strength normal upper extremities and lower extremities, sensation normal throughout, DTRs 2+ throughout, no cerebellar signs, gait normal/steady Psychiatric: normal affect, behavior normal, pleasant   Medicare Attestation I have personally reviewed: The  patient's medical and social history Their use of alcohol, tobacco or illicit drugs Their current medications and supplements The patient's functional ability including ADLs,fall risks, home safety risks, cognitive, and hearing and visual impairment Diet and physical activities Evidence for depression or mood disorders  The patient's weight, height, BMI, and visual acuity have been recorded in the chart.  I have made referrals, counseling, and provided education to the patient based on review of the above and I have provided the patient with a written personalized care plan for preventive services.     Adela Glimpse, NP   07/13/2023

## 2023-07-13 NOTE — Patient Instructions (Signed)

## 2023-07-14 LAB — COMPLETE METABOLIC PANEL WITH GFR
AG Ratio: 2.1 (calc) (ref 1.0–2.5)
ALT: 15 U/L (ref 6–29)
AST: 17 U/L (ref 10–35)
Albumin: 4.6 g/dL (ref 3.6–5.1)
Alkaline phosphatase (APISO): 59 U/L (ref 37–153)
BUN: 21 mg/dL (ref 7–25)
CO2: 30 mmol/L (ref 20–32)
Calcium: 9.3 mg/dL (ref 8.6–10.4)
Chloride: 101 mmol/L (ref 98–110)
Creat: 0.95 mg/dL (ref 0.50–1.05)
Globulin: 2.2 g/dL (ref 1.9–3.7)
Glucose, Bld: 82 mg/dL (ref 65–99)
Potassium: 4.3 mmol/L (ref 3.5–5.3)
Sodium: 139 mmol/L (ref 135–146)
Total Bilirubin: 0.4 mg/dL (ref 0.2–1.2)
Total Protein: 6.8 g/dL (ref 6.1–8.1)
eGFR: 66 mL/min/{1.73_m2} (ref >=60)

## 2023-07-14 LAB — CBC WITH DIFFERENTIAL/PLATELET
Absolute Monocytes: 533 {cells}/uL (ref 200–950)
Basophils Absolute: 43 {cells}/uL (ref 0–200)
Basophils Relative: 0.6 %
Eosinophils Absolute: 209 {cells}/uL (ref 15–500)
Eosinophils Relative: 2.9 %
HCT: 39.3 % (ref 35.0–45.0)
Hemoglobin: 13 g/dL (ref 11.7–15.5)
Lymphs Abs: 1685 {cells}/uL (ref 850–3900)
MCH: 31.9 pg (ref 27.0–33.0)
MCHC: 33.1 g/dL (ref 32.0–36.0)
MCV: 96.3 fL (ref 80.0–100.0)
MPV: 10.5 fL (ref 7.5–12.5)
Monocytes Relative: 7.4 %
Neutro Abs: 4730 {cells}/uL (ref 1500–7800)
Neutrophils Relative %: 65.7 %
Platelets: 253 10*3/uL (ref 140–400)
RBC: 4.08 10*6/uL (ref 3.80–5.10)
RDW: 11.3 % (ref 11.0–15.0)
Total Lymphocyte: 23.4 %
WBC: 7.2 10*3/uL (ref 3.8–10.8)

## 2023-07-14 LAB — HEMOGLOBIN A1C
Hgb A1c MFr Bld: 5.6 %{Hb} (ref <5.7)
Mean Plasma Glucose: 114 mg/dL
eAG (mmol/L): 6.3 mmol/L

## 2023-07-14 LAB — LIPID PANEL
Cholesterol: 149 mg/dL (ref <200)
HDL: 52 mg/dL (ref >=50)
LDL Cholesterol (Calc): 78 mg/dL
Non-HDL Cholesterol (Calc): 97 mg/dL (ref <130)
Total CHOL/HDL Ratio: 2.9 (calc) (ref <5.0)
Triglycerides: 107 mg/dL (ref <150)

## 2023-08-08 ENCOUNTER — Other Ambulatory Visit (HOSPITAL_COMMUNITY): Payer: Self-pay | Admitting: Psychiatry

## 2023-08-08 DIAGNOSIS — F331 Major depressive disorder, recurrent, moderate: Secondary | ICD-10-CM

## 2023-08-10 ENCOUNTER — Other Ambulatory Visit (HOSPITAL_COMMUNITY): Payer: Self-pay

## 2023-08-10 DIAGNOSIS — F331 Major depressive disorder, recurrent, moderate: Secondary | ICD-10-CM

## 2023-08-10 MED ORDER — LAMOTRIGINE 25 MG PO TABS
25.0000 mg | ORAL_TABLET | Freq: Two times a day (BID) | ORAL | 0 refills | Status: AC
Start: 2023-08-10 — End: ?

## 2023-08-11 ENCOUNTER — Ambulatory Visit (HOSPITAL_COMMUNITY): Payer: PPO | Admitting: Psychiatry

## 2023-08-30 ENCOUNTER — Other Ambulatory Visit (HOSPITAL_COMMUNITY): Payer: Self-pay | Admitting: *Deleted

## 2023-08-30 DIAGNOSIS — F411 Generalized anxiety disorder: Secondary | ICD-10-CM

## 2023-08-30 DIAGNOSIS — F331 Major depressive disorder, recurrent, moderate: Secondary | ICD-10-CM

## 2023-08-30 MED ORDER — LAMOTRIGINE 25 MG PO TABS
25.0000 mg | ORAL_TABLET | Freq: Two times a day (BID) | ORAL | 0 refills | Status: DC
Start: 2023-09-08 — End: 2023-09-29

## 2023-08-30 MED ORDER — DULOXETINE HCL 20 MG PO CPEP: 40.0000 mg | ORAL_CAPSULE | Freq: Every day | ORAL | 0 refills | Status: DC

## 2023-09-12 ENCOUNTER — Encounter: Payer: Self-pay | Admitting: Nurse Practitioner

## 2023-09-12 DIAGNOSIS — J301 Allergic rhinitis due to pollen: Secondary | ICD-10-CM

## 2023-09-12 DIAGNOSIS — J4520 Mild intermittent asthma, uncomplicated: Secondary | ICD-10-CM

## 2023-09-12 DIAGNOSIS — J45909 Unspecified asthma, uncomplicated: Secondary | ICD-10-CM

## 2023-09-12 MED ORDER — FLUTICASONE-SALMETEROL 115-21 MCG/ACT IN AERO
INHALATION_SPRAY | RESPIRATORY_TRACT | 3 refills | Status: DC
Start: 2023-09-12 — End: 2023-09-14

## 2023-09-12 MED ORDER — FLUTICASONE PROPIONATE 50 MCG/ACT NA SUSP: NASAL | 3 refills | Status: DC

## 2023-09-12 MED ORDER — AEROCHAMBER MV MISC
0 refills | Status: DC
Start: 2023-09-12 — End: 2023-09-14

## 2023-09-14 ENCOUNTER — Other Ambulatory Visit: Payer: Self-pay

## 2023-09-14 ENCOUNTER — Telehealth: Payer: Self-pay | Admitting: Nurse Practitioner

## 2023-09-14 DIAGNOSIS — J45909 Unspecified asthma, uncomplicated: Secondary | ICD-10-CM

## 2023-09-14 DIAGNOSIS — J4520 Mild intermittent asthma, uncomplicated: Secondary | ICD-10-CM

## 2023-09-14 MED ORDER — AEROCHAMBER MV MISC: 0 refills | Status: AC

## 2023-09-14 MED ORDER — FLUTICASONE-SALMETEROL 115-21 MCG/ACT IN AERO: INHALATION_SPRAY | RESPIRATORY_TRACT | 3 refills | Status: DC

## 2023-09-14 NOTE — Telephone Encounter (Signed)
I saw you refilled pt's ADVAIR HFA 115-21 inhaler. It was sent to [facility] pharmacy. Pt would like it sent to [facility]- [address] [place], [address].

## 2023-09-14 NOTE — Addendum Note (Signed)
Addended by: Dionicio Stall on: 09/14/2023 02:21 PM   Modules accepted: Orders

## 2023-09-29 ENCOUNTER — Ambulatory Visit (HOSPITAL_COMMUNITY): Payer: PPO | Admitting: Psychiatry

## 2023-09-29 ENCOUNTER — Encounter (HOSPITAL_COMMUNITY): Payer: Self-pay | Admitting: Psychiatry

## 2023-09-29 VITALS — BP 157/97 | HR 74 | Ht 61.0 in | Wt 107.8 lb

## 2023-09-29 DIAGNOSIS — F331 Major depressive disorder, recurrent, moderate: Secondary | ICD-10-CM | POA: Diagnosis not present

## 2023-09-29 DIAGNOSIS — F411 Generalized anxiety disorder: Secondary | ICD-10-CM

## 2023-09-29 MED ORDER — CLONAZEPAM 0.5 MG PO TABS: ORAL_TABLET | ORAL | 1 refills | Status: DC

## 2023-09-29 MED ORDER — LAMOTRIGINE 25 MG PO TABS
25.0000 mg | ORAL_TABLET | Freq: Three times a day (TID) | ORAL | 2 refills | Status: DC
Start: 2023-09-29 — End: 2023-12-29

## 2023-09-29 MED ORDER — DULOXETINE HCL 20 MG PO CPEP: 40.0000 mg | ORAL_CAPSULE | Freq: Every day | ORAL | 2 refills | Status: DC

## 2023-09-29 MED ORDER — AMITRIPTYLINE HCL 25 MG PO TABS
25.0000 mg | ORAL_TABLET | Freq: Every day | ORAL | 0 refills | Status: DC
Start: 2023-09-29 — End: 2023-12-29

## 2023-09-29 NOTE — Progress Notes (Signed)
BH MD/PA/NP OP Progress Note   Patient Location: Office Provider Location: Office  09/29/2023 1:31 PM Sarah Rodriguez  MRN:  [id number]   HPI: Patient came in for her follow-up appointment in the office.  She reported feeling very sad, unmotivated to do things.  She believes her biggest lack of motivation is due to loss of hearing.  She does not enjoy Conversation because sometimes she does not hear very well.  She reported not doing exercise because does not want to wake up in the morning.  She denies any suicidal thoughts but situation is making her more anxious and nervous.  Recently she had a blood work.  Labs are normal.  In the past we had tried higher dose of Cymbalta but she started to have dizziness.  Recently her daughter from [place] came to visit them and she was happy to see 37-month-old grandchild.  Patient told this is the second visit she had with the family and she is happy about it but sometime not able to enjoy as much because lack of motivation.  She also reported husband is not much involved in the daily life.  She has been trying to renovate the house but she had a hard time trying to convince the husband.  She also reported sleep very late because she watched news until 1130.  She sleeps around 2:00 and wake up very late.  She has appointment coming up on November 14 for a follow-up with the primary care.  She has no tremors or shakes or any EPS.  She is still take 1/8 of the Klonopin when she feels very nervous or anxious.  She is taking Lamictal 25 mg twice a day and reported no rash, itching, shakes or tremors.  She is taking amitriptyline 25 mg but does not want to go up because she feel it was make her more tired.  She denies any hallucination, paranoia, anger, suicidal thoughts.  Her appetite is okay.  Visit Diagnosis:    ICD-10-CM   1. MDD (major depressive disorder), recurrent episode, moderate (HCC)  F33.1 amitriptyline (ELAVIL) 25 MG tablet    clonazePAM  (KLONOPIN) 0.5 MG tablet    DULoxetine (CYMBALTA) 20 MG capsule    lamoTRIgine (LAMICTAL) 25 MG tablet    2. GAD (generalized anxiety disorder)  F41.1 amitriptyline (ELAVIL) 25 MG tablet    clonazePAM (KLONOPIN) 0.5 MG tablet    DULoxetine (CYMBALTA) 20 MG capsule       Past Psychiatric History:  H/O depression and taking antidepressants since 1996.  H/O panic attacks while driving.  Saw Dr. Emerson Monte and tried Prozac, Paxil, Zoloft, Xanax, Cymbalta and Klonopin.  No h/o inpatient treatment or any suicidal attempt.  Past Medical History:  Past Medical History:  Diagnosis Date   Asthma    Dulera daily   Chronic back pain    HNP   Complication of anesthesia    slow to wake up   Depression    takes Clonazepam nightly   GERD (gastroesophageal reflux disease)    takes Protonix daily    Heart murmur    History of migraine    takes Relpax daily as needed   HSV (herpes simplex virus) infection    Hyperlipidemia    takes Atorvastatin daily   Hypertension    takes Benicar daily   Hypothyroidism    takes Synthroid daily   MVP (mitral valve prolapse)    Pneumonia    hx of--been many yrs ago   Seasonal  allergies    takes Allegra daily   Shortness of breath    with exertion    Past Surgical History:  Procedure Laterality Date   ABLATION  2011   Uterine   buttocks surgery     at age 16 (coccyx repair)   CERVICAL FUSION  03/2010   CESAREAN SECTION  1992   CHOLECYSTECTOMY  2008   COLONOSCOPY     DILATION AND CURETTAGE OF UTERUS  2011   ESOPHAGOGASTRODUODENOSCOPY     LUMBAR LAMINECTOMY/DECOMPRESSION MICRODISCECTOMY  10/24/2012   Procedure: LUMBAR LAMINECTOMY/DECOMPRESSION MICRODISCECTOMY 1 LEVEL;  Surgeon: Barnett Abu, MD;  Location: [facility];  Service: Neurosurgery;  Laterality: Right;  Right Lumbar five-Sacral One Microdiskectomy   right knee arthroscopy     x 3   TONSILLECTOMY     at age 26    Family Psychiatric History: Reviewed.  Family History:   Family History  Problem Relation Age of Onset   Arthritis Mother    Heart attack Mother    Depression Mother    Allergies Mother    Hypertension Mother    Heart attack Father     Social History:  Social History   Socioeconomic History   Marital status: Married    Spouse name: Not on file   Number of children: Not on file   Years of education: Not on file   Highest education level: Not on file  Occupational History   Not on file  Tobacco Use   Smoking status: Former   Smokeless tobacco: Never   Tobacco comments:    quit at age 41  Vaping Use   Vaping status: Never Used  Substance and Sexual Activity   Alcohol use: No    Comment: 2-3 times a yr   Drug use: No   Sexual activity: Yes    Birth control/protection: Post-menopausal  Other Topics Concern   Not on file  Social History Narrative   Not on file   Social Determinants of Health   Financial Resource Strain: Not on file  Food Insecurity: Not on file  Transportation Needs: Not on file  Physical Activity: Not on file  Stress: Not on file  Social Connections: Unknown (03/31/2023)   [facility]   Social Network    Social Network: Not on file    Allergies:  Allergies  Allergen Reactions   Demerol [Meperidine] Other (See Comments)    REACTION: unknown--itching   Epinephrine Other (See Comments)    "Sensitivity."   Niacin And Related Rash    Metabolic Disorder Labs: Lab Results  Component Value Date   HGBA1C 5.6 07/13/2023   MPG 114 07/13/2023   MPG 114 04/11/2023   No results found for: "PROLACTIN" Lab Results  Component Value Date   CHOL 149 07/13/2023   TRIG 107 07/13/2023   HDL 52 07/13/2023   CHOLHDL 2.9 07/13/2023   VLDL 31 (H) 06/06/2017   LDLCALC 78 07/13/2023   LDLCALC 78 04/11/2023   Lab Results  Component Value Date   TSH 1.19 04/11/2023   TSH 1.45 01/06/2023    Therapeutic Level Labs: No results found for: "LITHIUM" No results found for:  "VALPROATE" No results found for: "CBMZ"  Current Medications: Current Outpatient Medications  Medication Sig Dispense Refill   albuterol (PROAIR HFA) 108 (90 Base) MCG/ACT inhaler Inhale 2 puffs into the lungs every 6 (six) hours as needed for wheezing or shortness of breath. 8 g 1   amitriptyline (ELAVIL) 25 MG tablet  Take 1 tablet (25 mg total) by mouth at bedtime. 90 tablet 0   Cholecalciferol (VITAMIN D PO) Take 4,000 Units by mouth daily.      CINNAMON PO Take 1,000 Units by mouth 2 (two) times daily.      clonazePAM (KLONOPIN) 0.5 MG tablet Take 1/2 to 1 tablet as needed for severe anxiety. 30 tablet 1   Cyanocobalamin (B-12 PO) Take by mouth.     Cyanocobalamin (B-12) 2500 MCG TABS Take by mouth.     DULoxetine (CYMBALTA) 20 MG capsule Take 2 capsules (40 mg total) by mouth daily. 60 capsule 0   fluticasone (FLONASE) 50 MCG/ACT nasal spray SHAKE LIQUID AND USE 2 SPRAYS IN EACH NOSTRIL EVERY EVENING 48 g 3   fluticasone-salmeterol (ADVAIR HFA) 115-21 MCG/ACT inhaler Use  2 inhalations  15 minutes apart  2 x /day (every 12 hours) 36 g 3   lamoTRIgine (LAMICTAL) 25 MG tablet Take 1 tablet (25 mg total) by mouth 2 (two) times daily. 30 day supply sent pt. Appt on 9/13 she was out 40 tablet 0   levothyroxine (SYNTHROID) 25 MCG tablet Take  1 tablet  Daily  on an empty stomach with only water for 30 minutes & no Antacid meds, Calcium or Magnesium for 4 hours & avoid Biotin  (  Dose change from 50 to 25 mcg ) 90 tablet 1   losartan (COZAAR) 50 MG tablet Take  1 tablet  Daily  for BP                                                                               /                  TAKE BY MOUTH 90 tablet 3   Magnesium 250 MG TABS Take 3 tablets by mouth daily.      meloxicam (MOBIC) 15 MG tablet Take  1 tablet Daily with Food for Pain & Inflammation                                            /                      TAKE                   BY                      MOUTH 90 tablet 3   montelukast  (SINGULAIR) 10 MG tablet Take 1 tablet Daily for Allergies                                                                              /  TAKE                                        BY                                  MOUTH 90 tablet 3   nystatin (MYCOSTATIN) 100000 UNIT/ML suspension SWISH AND SWALLOW 5 ML BY MOUTH FOUR TIMES DAILY FOR THRUSH OR YEAST INFECTION 480 mL 1   pantoprazole (PROTONIX) 40 MG tablet TAKE 1 TABLET BY MOUTH DAILY 90 tablet 3   PREMARIN vaginal cream INSERT 1/2 GRAM VAGINALLY VIA APPLICATOR NIGHTLY FOR W EEKS THEN JUST DO TEICE A WEEK  11   promethazine-dextromethorphan (PROMETHAZINE-DM) 6.25-15 MG/5ML syrup Take 2.5 mLs by mouth 4 (four) times daily as needed for cough. 240 mL 0   rosuvastatin (CRESTOR) 40 MG tablet Take  1 tablet  2 x /week  for Cholesterol                                                      /                                 TAKE                  BY                MOUTH 26 tablet 3   Spacer/Aero-Holding Chambers (AEROCHAMBER MV) inhaler Use as instructed with inhaler. 1 each 0   No current facility-administered medications for this visit.   Screenings: GAD-7    Advertising copywriter from 05/18/2022 in [facility] from 05/07/2021 in [facility]  Total GAD-7 Score 5 8      PHQ2-9    Flowsheet Row Office Visit from 07/13/2023 in [place] [facility] Office Visit from 01/06/2023 in [place] [facility] Office Visit from 09/30/2022 in [place] [facility] Counselor from 05/18/2022 in [facility] Visit from 01/06/2022 in [place] [facility]  PHQ-2 Total Score 1 1 0 2 1  PHQ-9 Total Score -- -- -- 6 --      Flowsheet Row Counselor from  05/18/2022 in [facility] from 05/07/2021 in [place] [facility] Video Visit from 05/05/2021 in [facility]  C-SSRS RISK CATEGORY No Risk No Risk No Risk       Psychiatric Specialty Exam: Physical Exam  Review of Systems  HENT:  Positive for hearing loss.   Psychiatric/Behavioral:  Positive for dysphoric mood.     Blood pressure (!) 157/97, pulse 74, height 5\' 1"  (1.549 m), weight 107 lb 12.8 oz (48.9 kg).There is no height or weight on file to calculate BMI.  General Appearance: Casual  Eye Contact:  Good  Speech:  Normal Rate  Volume:  Normal  Mood:  Dysphoric  Affect:  Congruent  Thought Process:  Goal Directed  Orientation:  Full (Time, Place, and Person)  Thought Content:  Rumination  Suicidal Thoughts:  No  Homicidal Thoughts:  No  Memory:  Immediate;   Good Recent;   Good Remote;   Good  Judgement:  Intact  Insight:  Present  Psychomotor Activity:  Normal  Concentration:  Concentration: Good and Attention Span: Good  Recall:  Good  Fund of Knowledge:  Good  Language:  Good  Akathisia:  No  Handed:  Right  AIMS (if indicated):     Assets:  Communication Skills Desire for Improvement Housing Resilience Social Support Transportation  ADL's:  Intact  Cognition:  WNL  Sleep:   fair     Assessment and Plan:  Major depressive disorder, recurrent.  Anxiety.  Discussed sleep hygiene.  Encouraged to go to bed early and take the medicine sooner.  Discussed to look for a volunteer opportunity where she can keep herself busy.  She was thinking about meals on wheels project but again she did not feel motivated.  I encouraged to consider and take the medicine sooner so she can sleep early and wake up early in the morning.  Recommend trying up on the Lamictal 25 mg to take 3 times a day however reminded if she has a rash or any itching that she need to  call us immediately.  I reviewed blood work results.  Continue Klonopin half tablet to 1/8 at bedtime for sleep, continue amitriptyline 25 mg at bedtime and Cymbalta 20 mg 2 times a day.  Recommended to call us back if she has any question or any concern.  I provided brief psychotherapy.  Follow-up in 3 months.    Collaboration of Care: Collaboration of Care: Primary Care Provider AEB notes are available in epic to review.  Patient/Guardian was advised Release of Information must be obtained prior to any record release in order to collaborate their care with an outside provider. Patient/Guardian was advised if they have not already done so to contact the registration department to sign all necessary forms in order for [place] to release information regarding their care.   Consent: Patient/Guardian gives verbal consent for treatment and assignment of benefits for services provided during this visit. Patient/Guardian expressed understanding and agreed to proceed.   I spent 25 minutes time face-to-face during this encounter.  Cleotis Nipper, MD 09/29/2023, 1:31 PM

## 2023-10-06 ENCOUNTER — Encounter: Payer: PPO | Admitting: Internal Medicine

## 2023-10-13 ENCOUNTER — Encounter: Payer: Self-pay | Admitting: Nurse Practitioner

## 2023-10-13 ENCOUNTER — Ambulatory Visit (INDEPENDENT_AMBULATORY_CARE_PROVIDER_SITE_OTHER): Payer: PPO | Admitting: Nurse Practitioner

## 2023-10-13 VITALS — BP 140/90 | HR 68 | Temp 98.1°F | Ht 61.0 in | Wt 107.4 lb

## 2023-10-13 DIAGNOSIS — K219 Gastro-esophageal reflux disease without esophagitis: Secondary | ICD-10-CM | POA: Diagnosis not present

## 2023-10-13 DIAGNOSIS — Z136 Encounter for screening for cardiovascular disorders: Secondary | ICD-10-CM

## 2023-10-13 DIAGNOSIS — Z1389 Encounter for screening for other disorder: Secondary | ICD-10-CM | POA: Diagnosis not present

## 2023-10-13 DIAGNOSIS — R7309 Other abnormal glucose: Secondary | ICD-10-CM

## 2023-10-13 DIAGNOSIS — Z0001 Encounter for general adult medical examination with abnormal findings: Secondary | ICD-10-CM

## 2023-10-13 DIAGNOSIS — E559 Vitamin D deficiency, unspecified: Secondary | ICD-10-CM

## 2023-10-13 DIAGNOSIS — I10 Essential (primary) hypertension: Secondary | ICD-10-CM

## 2023-10-13 DIAGNOSIS — H6122 Impacted cerumen, left ear: Secondary | ICD-10-CM | POA: Diagnosis not present

## 2023-10-13 DIAGNOSIS — M549 Dorsalgia, unspecified: Secondary | ICD-10-CM | POA: Diagnosis not present

## 2023-10-13 DIAGNOSIS — E538 Deficiency of other specified B group vitamins: Secondary | ICD-10-CM | POA: Diagnosis not present

## 2023-10-13 DIAGNOSIS — E782 Mixed hyperlipidemia: Secondary | ICD-10-CM | POA: Diagnosis not present

## 2023-10-13 DIAGNOSIS — Z79899 Other long term (current) drug therapy: Secondary | ICD-10-CM

## 2023-10-13 DIAGNOSIS — G43109 Migraine with aura, not intractable, without status migrainosus: Secondary | ICD-10-CM

## 2023-10-13 DIAGNOSIS — R6889 Other general symptoms and signs: Secondary | ICD-10-CM | POA: Diagnosis not present

## 2023-10-13 DIAGNOSIS — F3341 Major depressive disorder, recurrent, in partial remission: Secondary | ICD-10-CM

## 2023-10-13 DIAGNOSIS — G8929 Other chronic pain: Secondary | ICD-10-CM

## 2023-10-13 DIAGNOSIS — E039 Hypothyroidism, unspecified: Secondary | ICD-10-CM

## 2023-10-13 DIAGNOSIS — G4730 Sleep apnea, unspecified: Secondary | ICD-10-CM

## 2023-10-13 DIAGNOSIS — J45909 Unspecified asthma, uncomplicated: Secondary | ICD-10-CM

## 2023-10-13 DIAGNOSIS — Z6821 Body mass index (BMI) 21.0-21.9, adult: Secondary | ICD-10-CM

## 2023-10-13 DIAGNOSIS — M4316 Spondylolisthesis, lumbar region: Secondary | ICD-10-CM

## 2023-10-13 DIAGNOSIS — M150 Primary generalized (osteo)arthritis: Secondary | ICD-10-CM

## 2023-10-13 DIAGNOSIS — J4520 Mild intermittent asthma, uncomplicated: Secondary | ICD-10-CM

## 2023-10-13 NOTE — Progress Notes (Addendum)
CPE  Sarah Rodriguez presents to clinic for annual CPE.  Below references diagnostics and current plan of care:   CPE Due annually  Health maintenance reviewed Healthily lifestyle goals set  Migraine with aura and without status migrainosus, not intractable Well managed by PRN OTC analgesic and phenergan, amitriptyline  Report to ER if there is weakness, thunderclap headache, visual changes, or any concerning factors  Essential hypertension Continue Losartan Discussed DASH (Dietary Approaches to Stop Hypertension) DASH diet is lower in sodium than a typical American diet. Cut back on foods that are high in saturated fat, cholesterol, and trans fats. Eat more whole-grain foods, fish, poultry, and nuts Remain active and exercise as tolerated daily.  Monitor BP at home-Call if greater than 130/80.  Check CMP/CBC  Uncomplicated asthma, unspecified asthma severity, unspecified whether persistent Controlled Continue Albuterol, Flonase, Advair, Montelukast Avoid triggers Continue to monitor  Gastroesophageal reflux disease, esophagitis presence not specified Continue Pantoprazole No suspected reflux complications (Barret/stricture). Lifestyle modification:  wt loss, avoid meals 2-3h before bedtime. Consider eliminating food triggers:  chocolate, caffeine, EtOH, acid/spicy food.  Hypothyroidism, unspecified typ Controlled. Continue Levothyroxine. Reminded to take on an empty stomach 30-58mins before food.  Stop any Biotin Supplement 48-72 hours before next TSH level to reduce the risk of falsely low TSH levels. Continue to monitor.    Spondylolisthesis at L4-L5 level S/p fusion for repair - no recent flare Followed by Dr. Langston Masker Continue to monitor  Primary osteoarthritis involving multiple joints Well managed by motrin/tylenol/lyrica Continue to monitor  Vitamin D deficiency Continue supplement - goal 60-100 Monitor levels  Other abnormal glucose (hx of  prediabetes) Education: Reviewed 'ABCs' of diabetes management  Discussed goals to be met and/or maintained include A1C (<7) Blood pressure (<130/80) Cholesterol (LDL <70) Continue Eye Exam yearly  Continue Dental Exam Q6 mo Discussed dietary recommendations Discussed Physical Activity recommendations Check A1C  Medication management All medications discussed and reviewed in full. All questions and concerns regarding medications addressed.    Hyperlipidemia, mixed Continue Rosuvastatin Discussed lifestyle modifications. Recommended diet heavy in fruits and veggies, omega 3's. Decrease consumption of animal meats, cheeses, and dairy products. Remain active and exercise as tolerated. Continue to monitor. Check lipids/TSH  Depression, major, recurrent, in partial remission (HCC) Followed by Dr. Lolly Mustache Continue medications, Lamotrigine, Duloxetine, Clonazepam, Amitriptyline Limit benzo use to <5 days a week if possible to avoid dependence  Lifestyle discussed: diet/exerise, sleep hygiene, stress management, hydration  Chronic back pain, unspecified back location, unspecified back pain laterality Continue follow up with Dr. Danielle Dess Continue to monitor  Sleep apnea Controlled Continue oral device  BMI 21 Discussed appropriate BMI Diet modification. Physical activity. Encouraged/praised to build confidence.  Screening for hematuria or proteinuria Check and monitor Microalbumin / creatinine urine ratio Check and monitor Urinalysis, Routine w reflex microscopic  B12 deficiency Monitor levels  Mild intermittent asthma Controlled Continue Adviar/Ventolin with spacer Continue to monitor  Cerumen impaction Left ear(s) irrigated with lukewarm water, hydrogen peroxide. Large piece of brown cerumen x3 extracted with ear curette. TM:  WNL.  Pt tolerated procedure well.     Orders Placed This Encounter  Procedures   CBC with Differential/Platelet   COMPLETE METABOLIC  PANEL WITH GFR   Magnesium   Lipid panel   TSH   Hemoglobin A1c   Insulin, random   VITAMIN D 25 Hydroxy (Vit-D Deficiency, Fractures)   Urinalysis, Routine w reflex microscopic   Microalbumin / creatinine urine ratio   Vitamin B12   EKG 12-Lead  Ear Lavage     Notify office for further evaluation and treatment, questions or concerns if any reported s/s fail to improve.   The patient was advised to call back or seek an in-person evaluation if any symptoms worsen or if the condition fails to improve as anticipated.   Further disposition pending results of labs. Discussed med's effects and SE's.    I discussed the assessment and treatment plan with the patient. The patient was provided an opportunity to ask questions and all were answered. The patient agreed with the plan and demonstrated an understanding of the instructions.  Discussed med's effects and SE's. Screening labs and tests as requested with regular follow-up as recommended.  I provided 35 minutes of face-to-face time during this encounter including counseling, chart review, and critical decision making was preformed.  Future Appointments  Date Time Provider Department Center  12/26/2023  1:20 PM Arfeen, Phillips Grout, MD [facility] None  01/16/2024  2:30 PM Adela Glimpse, NP [facility] None  03/08/2024  1:20 PM [facility] [facility] None  11/01/2024  2:00 PM Lucky Cowboy, MD [facility] None     Plan:   During the course of the visit the patient was educated and counseled about appropriate screening and preventive services including:   Pneumococcal vaccine  Prevnar 13 Influenza vaccine Td vaccine Screening electrocardiogram Bone densitometry screening Colorectal cancer screening Diabetes screening Glaucoma screening Nutrition counseling  Advanced directives: requested   Subjective:  Sarah Rodriguez is a 67 y.o. female who presents for Medicare Annual Wellness Visit and 3 month follow up. Patient has GERD  controlled on her meds.   Overall she reports feeling well today.  She is concerned for left ear muffled hearing and possible ear wax build up.  She does wear hearing aides. She is recovering from sinusitis, reports has persistent R ear pressure/pain without fever/chills or ear discharge.   She was referred to Dr. Irene Limbo who found a mild sleep apnea and was recommended an oral device and husband has noted improved snoring. Reports significant improvement.  Sleep well and feeling well rested.   She also has Chronic Pain Syndrome consequent of Cx DDD and Cx HNP surg in 2011 and then in 2013, she underwent L5/S1 Surg by Dr Danielle Dess and lastly in Nov 2016, she had a Lumbar fusion. She is able to control her pain on extra strength tylenol, has lyrica with limited benefit, avoided opioids.  She has hx of migraines; reports rare, typically more sinus. Does well with 1/2 tab excedrine.    She has history of asthma/allergies, states consistently has cough. She has had some sinus congestion/left side worse. Has seen ENT, flonase recommended.    she has a diagnosis of depression, follows with Dr. Lolly Mustache currently fairly controlled on duloxetine, lamictal and amitryptiline, and currently takes klonopin 0.25-0.5 mg BID PRN anxiety and insomnia, reports symptoms are well controlled on current regimen. she takes 0.125 mg of klonopin at night for sleep, uses rarely during the day for anxiety.   Hx of GERD on protonix 40 mg daily, failed taper by GI. Colonoscopy UTD 01/2021.  Recall 10 years. Due 01/2031  BMI is Body mass index is 20.29 kg/m., she admits needs to do better on diet and exercise.  Wt Readings from Last 3 Encounters:  10/13/23 107 lb 6.4 oz (48.7 kg)  07/13/23 108 lb 12.8 oz (49.4 kg)  04/11/23 104 lb 3.2 oz (47.3 kg)   Her blood pressure has been controlled at home, today their BP  is BP: (!) 140/90 She does workout. She denies chest pain, shortness of breath, dizziness.   She is on  cholesterol medication (rosuvastatin 40 mg Tues, Thurs, was having cramping with higher doses) and denies myalgias. Her cholesterol is not at goal. The cholesterol last visit was:   Lab Results  Component Value Date   CHOL 149 07/13/2023   HDL 52 07/13/2023   LDLCALC 78 07/13/2023   TRIG 107 07/13/2023   CHOLHDL 2.9 07/13/2023    She has been working on diet and exercise for prediabetes, and denies foot ulcerations, increased appetite, nausea, polydipsia, polyuria, visual disturbances, vomiting and weight loss. Last A1C in the office was:  Lab Results  Component Value Date   HGBA1C 5.6 07/13/2023   She is on thyroid medication. Her medication was not changed last visit. Taking 1/2 tab 50 mcg daily.  Lab Results  Component Value Date   TSH 1.19 04/11/2023   Last GFR: Lab Results  Component Value Date   GFRNONAA 67 04/16/2021   Patient is on Vitamin D supplement, taking 4000 IU daily, did reduce after last visit Lab Results  Component Value Date   VD25OH 91 04/11/2023       Medication Review: Current Outpatient Medications on File Prior to Visit  Medication Sig Dispense Refill   albuterol (PROAIR HFA) 108 (90 Base) MCG/ACT inhaler Inhale 2 puffs into the lungs every 6 (six) hours as needed for wheezing or shortness of breath. 8 g 1   amitriptyline (ELAVIL) 25 MG tablet Take 1 tablet (25 mg total) by mouth at bedtime. 90 tablet 0   Cholecalciferol (VITAMIN D PO) Take 4,000 Units by mouth daily.      CINNAMON PO Take 1,000 Units by mouth 2 (two) times daily.      clonazePAM (KLONOPIN) 0.5 MG tablet Take 1/2 to 1 tablet as needed for severe anxiety. 30 tablet 1   Cyanocobalamin (B-12 PO) Take by mouth.     DULoxetine (CYMBALTA) 20 MG capsule Take 2 capsules (40 mg total) by mouth daily. 60 capsule 2   fluticasone (FLONASE) 50 MCG/ACT nasal spray SHAKE LIQUID AND USE 2 SPRAYS IN EACH NOSTRIL EVERY EVENING 48 g 3   fluticasone-salmeterol (ADVAIR HFA) 115-21 MCG/ACT inhaler Use  2  inhalations  15 minutes apart  2 x /day (every 12 hours) 36 g 3   lamoTRIgine (LAMICTAL) 25 MG tablet Take 1 tablet (25 mg total) by mouth 3 (three) times daily after meals. (Patient taking differently: Take 25 mg by mouth 2 (two) times daily.) 90 tablet 2   levothyroxine (SYNTHROID) 25 MCG tablet Take  1 tablet  Daily  on an empty stomach with only water for 30 minutes & no Antacid meds, Calcium or Magnesium for 4 hours & avoid Biotin  (  Dose change from 50 to 25 mcg ) 90 tablet 1   losartan (COZAAR) 50 MG tablet Take  1 tablet  Daily  for BP                                                                               /  TAKE BY MOUTH 90 tablet 3   Magnesium 250 MG TABS Take 3 tablets by mouth daily.      meloxicam (MOBIC) 15 MG tablet Take  1 tablet Daily with Food for Pain & Inflammation                                            /                      TAKE                   BY                      MOUTH 90 tablet 3   montelukast (SINGULAIR) 10 MG tablet Take 1 tablet Daily for Allergies                                                                              /                                                                          TAKE                                        BY                                  MOUTH 90 tablet 3   nystatin (MYCOSTATIN) 100000 UNIT/ML suspension SWISH AND SWALLOW 5 ML BY MOUTH FOUR TIMES DAILY FOR THRUSH OR YEAST INFECTION 480 mL 1   pantoprazole (PROTONIX) 40 MG tablet TAKE 1 TABLET BY MOUTH DAILY 90 tablet 3   PREMARIN vaginal cream INSERT 1/2 GRAM VAGINALLY VIA APPLICATOR NIGHTLY FOR W EEKS THEN JUST DO TEICE A WEEK  11   rosuvastatin (CRESTOR) 40 MG tablet Take  1 tablet  2 x /week  for Cholesterol                                                      /                                 TAKE                  BY                MOUTH 26 tablet 3   Spacer/Aero-Holding  Chambers (AEROCHAMBER MV) inhaler Use as instructed with inhaler. 1 each 0    Cyanocobalamin (B-12) 2500 MCG TABS Take by mouth.     promethazine-dextromethorphan (PROMETHAZINE-DM) 6.25-15 MG/5ML syrup Take 2.5 mLs by mouth 4 (four) times daily as needed for cough. (Patient not taking: Reported on 10/13/2023) 240 mL 0   No current facility-administered medications on file prior to visit.    Allergies  Allergen Reactions   Demerol [Meperidine] Other (See Comments)    REACTION: unknown--itching   Epinephrine Other (See Comments)    "Sensitivity."   Niacin And Related Rash    Current Problems (verified) Patient Active Problem List   Diagnosis Date Noted   Sleep apnea 06/12/2020   Deviated septum 03/14/2018   Chronic back pain 12/21/2017   Depression, major, recurrent, in partial remission (HCC) 08/05/2016   Degenerative joint disease 08/05/2016   Migraine 03/03/2016   Spondylolisthesis at L4-L5 level 10/27/2015   Vitamin D deficiency 04/17/2014   Medication management 04/17/2014   Hyperlipidemia, mixed    Hypertension    Asthma    GERD (gastroesophageal reflux disease)    Hypothyroidism     Screening Tests Immunization History  Administered Date(s) Administered   Influenza Inj Mdck Quad Pf 08/27/2019   Influenza Inj Mdck Quad With Preservative 09/14/2017, 10/19/2018, 09/24/2020   Influenza, High Dose Seasonal PF 09/08/2022   Influenza,inj,Quad PF,6+ Mos 10/06/2021   Influenza,inj,quad, With Preservative 11/09/2016   Influenza-Unspecified 09/18/2013   Moderna Sars-Covid-2 Vaccination 02/15/2020, 03/18/2020, 11/10/2020   PNEUMOCOCCAL CONJUGATE-20 09/08/2022   PPD Test 04/17/2014   Pneumococcal-Unspecified 11/30/1995   Td 08/27/2005, 05/05/2016   Health Maintenance  Topic Date Due   Zoster Vaccines- Shingrix (1 of 2) Never done   DEXA SCAN  Never done   MAMMOGRAM  03/12/2023   INFLUENZA VACCINE  06/30/2023   COVID-19 Vaccine (4 - 2023-24 season) 07/31/2023   Medicare Annual Wellness (AWV)  01/07/2024   DTaP/Tdap/Td (3 - Tdap) 05/05/2026    Colonoscopy  02/26/2031   Pneumonia Vaccine 36+ Years old  Completed   Hepatitis C Screening  Completed   HPV VACCINES  Aged Out   Last colonoscopy: 02/25/2022 normal, 10 year recall  Last mammogram: annually at Dr. Algie Coffer, 02/2023 Last pap smear/pelvic exam: at GYN, following annually DEXA: GYN is following, plan to start age 43 - Ordered via [facility]  Shingles/Zostavax: declines due to cost   Names of Other Physician/Practitioners you currently use: 1. [facility]- here for primary care 2. Dr. [place], eye doctor, last visit 2024, wears glasses.  3. Dr. Aundria Rud, dentist, last visit 2023  Patient Care Team: Lucky Cowboy, MD as PCP - General (Internal Medicine) Barnett Abu, MD as Consulting Physician (Neurosurgery) Cleotis Nipper, MD as Consulting Physician (Psychiatry)  SURGICAL HISTORY She  has a past surgical history that includes Dilation and curettage of uterus (2011); Ablation (2011); Cholecystectomy (2008); Tonsillectomy; buttocks surgery; Cesarean section (1992); right knee arthroscopy; Cervical fusion (03/2010); Colonoscopy; Esophagogastroduodenoscopy; and Lumbar laminectomy/decompression microdiscectomy (10/24/2012). FAMILY HISTORY Her family history includes Allergies in her mother; Arthritis in her mother; Depression in her mother; Heart attack in her father and mother; Hypertension in her mother. SOCIAL HISTORY She  reports that she has quit smoking. She has never used smokeless tobacco. She reports that she does not drink alcohol and does not use drugs.   Review of Systems  Constitutional:  Negative for malaise/fatigue and weight loss.  HENT:  Positive for ear pain (R ear). Negative for congestion, ear discharge,  hearing loss, sinus pain, sore throat and tinnitus.   Eyes:  Negative for blurred vision and double vision.  Respiratory:  Negative for cough, sputum production, shortness of breath and wheezing.   Cardiovascular:   Negative for chest pain, palpitations, orthopnea, claudication, leg swelling and PND.  Gastrointestinal:  Negative for abdominal pain, blood in stool, constipation, diarrhea, heartburn, melena, nausea and vomiting.  Genitourinary: Negative.   Musculoskeletal:  Positive for back pain (intermittent, improved). Negative for falls, joint pain and myalgias.  Skin:  Negative for rash.  Neurological:  Negative for dizziness, tingling, sensory change, weakness and headaches.  Endo/Heme/Allergies:  Negative for polydipsia.  Psychiatric/Behavioral:  Positive for depression (chronic, psych following). Negative for memory loss, substance abuse and suicidal ideas. The patient is not nervous/anxious and does not have insomnia.   All other systems reviewed and are negative.    Objective:     Today's Vitals   10/13/23 1410  BP: (!) 140/90  Pulse: 68  Temp: 98.1 F (36.7 C)  SpO2: 98%  Weight: 107 lb 6.4 oz (48.7 kg)  Height: 5\' 1"  (1.549 m)    Body mass index is 20.29 kg/m.  General appearance: alert, no distress, WD/WN, female HEENT: normocephalic, sclerae anicteric, TMs pearly, R mildly distended without purulence or erythema, nares patent, no discharge or erythema, dry/flaky skin in canals; Good dentition, + R TMJ Neck: supple, no lymphadenopathy, no thyromegaly, no masses Heart: RRR, normal S1, S2,  no murmur Lungs: CTA bilaterally, no wheezes, rhonchi, or rales Abdomen: +bs, soft, non tender, non distended, no masses, no hepatomegaly, no splenomegaly Musculoskeletal: nontender, no swelling, no obvious deformity Extremities: no edema, no cyanosis, no clubbing Pulses: 2+ symmetric, upper and lower extremities, normal cap refill Neurological: alert, oriented x 3, CN2-12 intact, strength normal upper extremities and lower extremities, sensation normal throughout, DTRs 2+ throughout, no cerebellar signs, gait normal/steady Psychiatric: normal affect, behavior normal, pleasant   EKG:   NSR  Hiawatha Dressel, NP   10/13/2023

## 2023-10-13 NOTE — Patient Instructions (Signed)

## 2023-10-13 NOTE — Addendum Note (Signed)
Addended by: Adela Glimpse on: 10/13/2023 03:04 PM   Modules accepted: Orders

## 2023-10-14 ENCOUNTER — Other Ambulatory Visit: Payer: Self-pay | Admitting: Nurse Practitioner

## 2023-10-14 DIAGNOSIS — M549 Dorsalgia, unspecified: Secondary | ICD-10-CM | POA: Diagnosis not present

## 2023-10-14 DIAGNOSIS — K219 Gastro-esophageal reflux disease without esophagitis: Secondary | ICD-10-CM | POA: Diagnosis not present

## 2023-10-14 DIAGNOSIS — J45909 Unspecified asthma, uncomplicated: Secondary | ICD-10-CM

## 2023-10-14 DIAGNOSIS — E559 Vitamin D deficiency, unspecified: Secondary | ICD-10-CM | POA: Diagnosis not present

## 2023-10-14 DIAGNOSIS — Z1389 Encounter for screening for other disorder: Secondary | ICD-10-CM | POA: Diagnosis not present

## 2023-10-14 DIAGNOSIS — E538 Deficiency of other specified B group vitamins: Secondary | ICD-10-CM | POA: Diagnosis not present

## 2023-10-14 DIAGNOSIS — Z0001 Encounter for general adult medical examination with abnormal findings: Secondary | ICD-10-CM | POA: Diagnosis not present

## 2023-10-14 DIAGNOSIS — E039 Hypothyroidism, unspecified: Secondary | ICD-10-CM | POA: Diagnosis not present

## 2023-10-14 DIAGNOSIS — I10 Essential (primary) hypertension: Secondary | ICD-10-CM | POA: Diagnosis not present

## 2023-10-14 DIAGNOSIS — Z79899 Other long term (current) drug therapy: Secondary | ICD-10-CM | POA: Diagnosis not present

## 2023-10-14 DIAGNOSIS — G8929 Other chronic pain: Secondary | ICD-10-CM | POA: Diagnosis not present

## 2023-10-14 DIAGNOSIS — R7309 Other abnormal glucose: Secondary | ICD-10-CM | POA: Diagnosis not present

## 2023-10-14 DIAGNOSIS — E782 Mixed hyperlipidemia: Secondary | ICD-10-CM | POA: Diagnosis not present

## 2023-10-14 LAB — CBC WITH DIFFERENTIAL/PLATELET
Absolute Lymphocytes: 1582 {cells}/uL (ref 850–3900)
Absolute Monocytes: 476 {cells}/uL (ref 200–950)
Basophils Absolute: 28 {cells}/uL (ref 0–200)
Basophils Relative: 0.4 %
Eosinophils Absolute: 161 {cells}/uL (ref 15–500)
Eosinophils Relative: 2.3 %
HCT: 42.3 % (ref 35.0–45.0)
Hemoglobin: 14.3 g/dL (ref 11.7–15.5)
MCH: 32.3 pg (ref 27.0–33.0)
MCHC: 33.8 g/dL (ref 32.0–36.0)
MCV: 95.5 fL (ref 80.0–100.0)
MPV: 10.8 fL (ref 7.5–12.5)
Monocytes Relative: 6.8 %
Neutro Abs: 4753 {cells}/uL (ref 1500–7800)
Neutrophils Relative %: 67.9 %
Platelets: 257 10*3/uL (ref 140–400)
RBC: 4.43 10*6/uL (ref 3.80–5.10)
RDW: 11.2 % (ref 11.0–15.0)
Total Lymphocyte: 22.6 %
WBC: 7 10*3/uL (ref 3.8–10.8)

## 2023-10-14 LAB — COMPLETE METABOLIC PANEL WITH GFR
AG Ratio: 2.1 (calc) (ref 1.0–2.5)
ALT: 20 U/L (ref 6–29)
AST: 23 U/L (ref 10–35)
Albumin: 4.6 g/dL (ref 3.6–5.1)
Alkaline phosphatase (APISO): 65 U/L (ref 37–153)
BUN: 16 mg/dL (ref 7–25)
CO2: 32 mmol/L (ref 20–32)
Calcium: 10 mg/dL (ref 8.6–10.4)
Chloride: 101 mmol/L (ref 98–110)
Creat: 1.02 mg/dL (ref 0.50–1.05)
Globulin: 2.2 g/dL (ref 1.9–3.7)
Glucose, Bld: 118 mg/dL — ABNORMAL HIGH (ref 65–99)
Potassium: 4.8 mmol/L (ref 3.5–5.3)
Sodium: 141 mmol/L (ref 135–146)
Total Bilirubin: 0.4 mg/dL (ref 0.2–1.2)
Total Protein: 6.8 g/dL (ref 6.1–8.1)
eGFR: 61 mL/min/{1.73_m2} (ref >=60)

## 2023-10-14 LAB — HEMOGLOBIN A1C
Hgb A1c MFr Bld: 5.5 %{Hb} (ref <5.7)
Mean Plasma Glucose: 111 mg/dL
eAG (mmol/L): 6.2 mmol/L

## 2023-10-14 LAB — TSH: TSH: 1.58 m[IU]/L (ref 0.40–4.50)

## 2023-10-14 LAB — LIPID PANEL
Cholesterol: 165 mg/dL (ref <200)
HDL: 49 mg/dL — ABNORMAL LOW (ref >=50)
LDL Cholesterol (Calc): 89 mg/dL
Non-HDL Cholesterol (Calc): 116 mg/dL (ref <130)
Total CHOL/HDL Ratio: 3.4 (calc) (ref <5.0)
Triglycerides: 176 mg/dL — ABNORMAL HIGH (ref <150)

## 2023-10-14 LAB — INSULIN, RANDOM: Insulin: 30.9 u[IU]/mL — ABNORMAL HIGH

## 2023-10-14 LAB — VITAMIN B12: Vitamin B-12: >2000 pg/mL — ABNORMAL HIGH (ref 200–1100)

## 2023-10-14 LAB — MAGNESIUM: Magnesium: 2 mg/dL (ref 1.5–2.5)

## 2023-10-14 LAB — VITAMIN D 25 HYDROXY (VIT D DEFICIENCY, FRACTURES): Vit D, 25-Hydroxy: 82 ng/mL (ref 30–100)

## 2023-10-15 LAB — MICROALBUMIN / CREATININE URINE RATIO
Creatinine, Urine: 67 mg/dL (ref 20–275)
Microalb Creat Ratio: 6 mg/g{creat} (ref <30)
Microalb, Ur: 0.4 mg/dL

## 2023-10-15 LAB — URINALYSIS, ROUTINE W REFLEX MICROSCOPIC
Bilirubin Urine: NEGATIVE
Glucose, UA: NEGATIVE
Hgb urine dipstick: NEGATIVE
Ketones, ur: NEGATIVE
Leukocytes,Ua: NEGATIVE
Nitrite: NEGATIVE
Protein, ur: NEGATIVE
Specific Gravity, Urine: 1.011 (ref 1.001–1.035)
pH: 6 (ref 5.0–8.0)

## 2023-10-31 ENCOUNTER — Other Ambulatory Visit: Payer: Self-pay | Admitting: Nurse Practitioner

## 2023-10-31 DIAGNOSIS — J028 Acute pharyngitis due to other specified organisms: Secondary | ICD-10-CM

## 2023-11-28 ENCOUNTER — Telehealth: Payer: Self-pay | Admitting: Nurse Practitioner

## 2023-11-28 DIAGNOSIS — J4520 Mild intermittent asthma, uncomplicated: Secondary | ICD-10-CM

## 2023-11-28 MED ORDER — FLUTICASONE-SALMETEROL 115-21 MCG/ACT IN AERO: INHALATION_SPRAY | RESPIRATORY_TRACT | 3 refills | Status: DC

## 2023-11-28 NOTE — Telephone Encounter (Signed)
Please send refill of ADVAIR HFA to [phone] - [place], [place] - [address]

## 2023-11-28 NOTE — Addendum Note (Signed)
Addended by: Dionicio Stall on: 11/28/2023 03:56 PM   Modules accepted: Orders

## 2023-12-06 ENCOUNTER — Other Ambulatory Visit: Payer: Self-pay | Admitting: Internal Medicine

## 2023-12-06 DIAGNOSIS — I10 Essential (primary) hypertension: Secondary | ICD-10-CM

## 2023-12-06 MED ORDER — LOSARTAN POTASSIUM 50 MG PO TABS: ORAL_TABLET | ORAL | 3 refills | Status: DC

## 2023-12-19 ENCOUNTER — Other Ambulatory Visit: Payer: Self-pay

## 2023-12-19 DIAGNOSIS — E782 Mixed hyperlipidemia: Secondary | ICD-10-CM

## 2023-12-19 MED ORDER — ROSUVASTATIN CALCIUM 40 MG PO TABS: ORAL_TABLET | ORAL | 3 refills | Status: DC

## 2023-12-26 ENCOUNTER — Ambulatory Visit (HOSPITAL_COMMUNITY): Payer: PPO | Admitting: Psychiatry

## 2023-12-29 ENCOUNTER — Encounter (HOSPITAL_COMMUNITY): Payer: Self-pay | Admitting: Psychiatry

## 2023-12-29 ENCOUNTER — Ambulatory Visit (HOSPITAL_COMMUNITY): Payer: PPO | Admitting: Psychiatry

## 2023-12-29 DIAGNOSIS — F411 Generalized anxiety disorder: Secondary | ICD-10-CM

## 2023-12-29 DIAGNOSIS — F331 Major depressive disorder, recurrent, moderate: Secondary | ICD-10-CM

## 2023-12-29 MED ORDER — LAMOTRIGINE 25 MG PO TABS: 25.0000 mg | ORAL_TABLET | Freq: Two times a day (BID) | ORAL | 2 refills | Status: DC

## 2023-12-29 MED ORDER — DULOXETINE HCL 20 MG PO CPEP: 40.0000 mg | ORAL_CAPSULE | Freq: Every day | ORAL | 2 refills | Status: DC

## 2023-12-29 MED ORDER — AMITRIPTYLINE HCL 25 MG PO TABS: 25.0000 mg | ORAL_TABLET | Freq: Every day | ORAL | 0 refills | Status: DC

## 2023-12-29 MED ORDER — CLONAZEPAM 0.5 MG PO TABS: ORAL_TABLET | ORAL | 1 refills | Status: DC

## 2023-12-29 NOTE — Progress Notes (Signed)
BH MD/PA/NP OP Progress Note   Patient Location: Office Provider Location: Office  12/29/2023 3:42 PM Sarah Rodriguez  MRN:  [id number]   HPI:  Patient came for her follow-up appointment in the office.  She is doing better since the last visit.  She is not sure what helped but she is more calm and social.  She admitted did not make progress for the senior citizen project which she was thinking but still like to pursue wheels on meal project.  She is sleeping better as working on sleep hygiene.  We have recommend to try Lamictal 3 times a day but she could not handle and back to twice a day.  She denies any panic attack, crying spells or any feeling of hopelessness or worthlessness.  She is using her coping skills to overcome with her anxiety.  She admitted it is a process but hoping to continue since it helps.  She sleeps good when she get into sleep.  She is taking amitriptyline, Klonopin at bedtime.  She is also compliant with Cymbalta.  She has no rash, itching, tremors or shakes.  Her appetite is okay.  Her energy level is improved.  She excited about upcoming trip of grandchild who is now 76-year-old.  Patient told they live in [place].  She reported her husband is the same and does not do much but she had decided to do herself.  She denies any hopelessness or suicidal thoughts.  She like to keep the current medication.  Visit Diagnosis:    ICD-10-CM   1. GAD (generalized anxiety disorder)  F41.1 amitriptyline (ELAVIL) 25 MG tablet    clonazePAM (KLONOPIN) 0.5 MG tablet    DULoxetine (CYMBALTA) 20 MG capsule    2. MDD (major depressive disorder), recurrent episode, moderate (HCC)  F33.1 amitriptyline (ELAVIL) 25 MG tablet    clonazePAM (KLONOPIN) 0.5 MG tablet    DULoxetine (CYMBALTA) 20 MG capsule    lamoTRIgine (LAMICTAL) 25 MG tablet        Past Psychiatric History:  H/O depression and taking antidepressants since 1996.  H/O panic attacks while driving.  Saw Dr. Emerson Monte and tried Prozac, Paxil, Zoloft, Xanax, Cymbalta and Klonopin.  No h/o inpatient treatment or any suicidal attempt.  Past Medical History:  Past Medical History:  Diagnosis Date   Asthma    Dulera daily   Chronic back pain    HNP   Complication of anesthesia    slow to wake up   Depression    takes Clonazepam nightly   GERD (gastroesophageal reflux disease)    takes Protonix daily    Heart murmur    History of migraine    takes Relpax daily as needed   HSV (herpes simplex virus) infection    Hyperlipidemia    takes Atorvastatin daily   Hypertension    takes Benicar daily   Hypothyroidism    takes Synthroid daily   MVP (mitral valve prolapse)    Pneumonia    hx of--been many yrs ago   Seasonal allergies    takes Allegra daily   Shortness of breath    with exertion    Past Surgical History:  Procedure Laterality Date   ABLATION  2011   Uterine   buttocks surgery     at age 68 (coccyx repair)   CERVICAL FUSION  03/2010   CESAREAN SECTION  1992   CHOLECYSTECTOMY  2008   COLONOSCOPY     DILATION AND CURETTAGE OF UTERUS  2011   ESOPHAGOGASTRODUODENOSCOPY     LUMBAR LAMINECTOMY/DECOMPRESSION MICRODISCECTOMY  10/24/2012   Procedure: LUMBAR LAMINECTOMY/DECOMPRESSION MICRODISCECTOMY 1 LEVEL;  Surgeon: Barnett Abu, MD;  Location: [facility];  Service: Neurosurgery;  Laterality: Right;  Right Lumbar five-Sacral One Microdiskectomy   right knee arthroscopy     x 3   TONSILLECTOMY     at age 11    Family Psychiatric History: Reviewed.  Family History:  Family History  Problem Relation Age of Onset   Arthritis Mother    Heart attack Mother    Depression Mother    Allergies Mother    Hypertension Mother    Heart attack Father     Social History:  Social History   Socioeconomic History   Marital status: Married    Spouse name: Not on file   Number of children: Not on file   Years of education: Not on file   Highest education level: Not on file   Occupational History   Not on file  Tobacco Use   Smoking status: Former   Smokeless tobacco: Never   Tobacco comments:    quit at age 14  Vaping Use   Vaping status: Never Used  Substance and Sexual Activity   Alcohol use: No    Comment: 2-3 times a yr   Drug use: No   Sexual activity: Yes    Birth control/protection: Post-menopausal  Other Topics Concern   Not on file  Social History Narrative   Not on file   Social Drivers of Health   Financial Resource Strain: Not on file  Food Insecurity: Not on file  Transportation Needs: Not on file  Physical Activity: Not on file  Stress: Not on file  Social Connections: Unknown (03/31/2023)   [facility]   Social Network    Social Network: Not on file    Allergies:  Allergies  Allergen Reactions   Demerol [Meperidine] Other (See Comments)    REACTION: unknown--itching   Epinephrine Other (See Comments)    "Sensitivity."   Niacin And Related Rash    Metabolic Disorder Labs: Lab Results  Component Value Date   HGBA1C 5.5 10/13/2023   MPG 111 10/13/2023   MPG 114 07/13/2023   No results found for: "PROLACTIN" Lab Results  Component Value Date   CHOL 165 10/13/2023   TRIG 176 (H) 10/13/2023   HDL 49 (L) 10/13/2023   CHOLHDL 3.4 10/13/2023   VLDL 31 (H) 06/06/2017   LDLCALC 89 10/13/2023   LDLCALC 78 07/13/2023   Lab Results  Component Value Date   TSH 1.58 10/13/2023   TSH 1.19 04/11/2023    Therapeutic Level Labs: No results found for: "LITHIUM" No results found for: "VALPROATE" No results found for: "CBMZ"  Current Medications: Current Outpatient Medications  Medication Sig Dispense Refill   albuterol (PROAIR HFA) 108 (90 Base) MCG/ACT inhaler Inhale 2 puffs into the lungs every 6 (six) hours as needed for wheezing or shortness of breath. 8 g 1   amitriptyline (ELAVIL) 25 MG tablet Take 1 tablet (25 mg total) by mouth at bedtime. 90 tablet 0   Cholecalciferol (VITAMIN D  PO) Take 4,000 Units by mouth daily.      CINNAMON PO Take 1,000 Units by mouth 2 (two) times daily.      clonazePAM (KLONOPIN) 0.5 MG tablet Take 1/2 to 1 tablet as needed for severe anxiety. 30 tablet 1   Cyanocobalamin (B-12 PO) Take by mouth.  Cyanocobalamin (B-12) 2500 MCG TABS Take by mouth.     DULoxetine (CYMBALTA) 20 MG capsule Take 2 capsules (40 mg total) by mouth daily. 60 capsule 2   fluticasone (FLONASE) 50 MCG/ACT nasal spray SHAKE LIQUID AND USE 2 SPRAYS IN EACH NOSTRIL EVERY EVENING 48 g 3   fluticasone-salmeterol (ADVAIR HFA) 115-21 MCG/ACT inhaler Use  2 inhalations  15 minutes apart  2 x /day (every 12 hours) 36 g 3   lamoTRIgine (LAMICTAL) 25 MG tablet Take 1 tablet (25 mg total) by mouth 3 (three) times daily after meals. (Patient taking differently: Take 25 mg by mouth 2 (two) times daily.) 90 tablet 2   levothyroxine (SYNTHROID) 25 MCG tablet Take  1 tablet  Daily  on an empty stomach with only water for 30 minutes & no Antacid meds, Calcium or Magnesium for 4 hours & avoid Biotin  (  Dose change from 50 to 25 mcg ) 90 tablet 1   losartan (COZAAR) 50 MG tablet Take  1 tablet  Daily  for BP         /       TAKE      BY      MOUTH 90 tablet 3   Magnesium 250 MG TABS Take 3 tablets by mouth daily.      meloxicam (MOBIC) 15 MG tablet Take  1 tablet Daily with Food for Pain & Inflammation                                            /                      TAKE                   BY                      MOUTH 90 tablet 3   montelukast (SINGULAIR) 10 MG tablet Take 1 tablet Daily for Allergies                                                                              /                                                                          TAKE                                        BY                                  MOUTH 90 tablet 3   nystatin (MYCOSTATIN)  100000 UNIT/ML suspension SWISH AND SWALLOW BY MOUTH FOUR TIMES A DAY FOR THRUSH OR YEAST INFECTION 473 mL 1    pantoprazole (PROTONIX) 40 MG tablet TAKE 1 TABLET BY MOUTH DAILY 90 tablet 3   PREMARIN vaginal cream INSERT 1/2 GRAM VAGINALLY VIA APPLICATOR NIGHTLY FOR W EEKS THEN JUST DO TEICE A WEEK  11   promethazine-dextromethorphan (PROMETHAZINE-DM) 6.25-15 MG/5ML syrup Take 2.5 mLs by mouth 4 (four) times daily as needed for cough. (Patient not taking: Reported on 10/13/2023) 240 mL 0   rosuvastatin (CRESTOR) 40 MG tablet Take  1 tablet  2 x /week  for Cholesterol                                                      /                                 TAKE                  BY                MOUTH 26 tablet 3   Spacer/Aero-Holding Chambers (AEROCHAMBER MV) inhaler Use as instructed with inhaler. 1 each 0   No current facility-administered medications for this visit.   Screenings: GAD-7    Advertising copywriter from 05/18/2022 in [facility] from 05/07/2021 in [facility]  Total GAD-7 Score 5 8      PHQ2-9    Flowsheet Row Office Visit from 07/13/2023 in [place] [facility] Office Visit from 01/06/2023 in [place] [facility] Office Visit from 09/30/2022 in [place] [facility] Counselor from 05/18/2022 in [facility] at [facility] Visit from 01/06/2022 in [place] [facility]  PHQ-2 Total Score 1 1 0 2 1  PHQ-9 Total Score -- -- -- 6 --      Flowsheet Row Counselor from 05/18/2022 in [place] [facility] from 05/07/2021 in [place] [facility] Video Visit from 05/05/2021 in [facility]  C-SSRS RISK CATEGORY No Risk No Risk No Risk      Psychiatric Specialty Exam: Physical Exam  Review of Systems  Weight 107 lb (48.5 kg).There is no height or weight on file  to calculate BMI.  General Appearance: Casual  Eye Contact:  Good  Speech:  Slow  Volume:  Decreased  Mood:  Euthymic  Affect:  Congruent  Thought Process:  Goal Directed  Orientation:  Full (Time, Place, and Person)  Thought Content:  Logical  Suicidal Thoughts:  No  Homicidal Thoughts:  No  Memory:  Immediate;   Good Recent;   Good Remote;   Good  Judgement:  Good  Insight:  Present  Psychomotor Activity:  Normal  Concentration:  Concentration: Good and Attention Span: Good  Recall:  Good  Fund of Knowledge:  Good  Language:  Good  Akathisia:  No  Handed:  Right  AIMS (if indicated):     Assets:  Communication Skills Desire for Improvement Housing Resilience Social Support Talents/Skills Transportation  ADL's:  Intact  Cognition:  WNL  Sleep:   better  Assessment and Plan:  Patient doing better since the last visit.  She is working on her sleep hygiene and she feel it is working and helping.  She is also using her coping skills.  She is learning to focus on herself.  She could not tolerate the rectal 3 times a day and back to 2 times a day but she feel overall things are working.  She does not want to change the medication.  Continue Klonopin half tablet to one fourth as needed.  Continue amitriptyline 25 mg at bedtime, Cymbalta 20 mg 2 times a day and keep the Lamictal 25 mg twice a day.  Discussed medication side effects and benefits.  Recommend to call us back if she is any question or any concern.  Follow-up in 3 months   Collaboration of Care: Collaboration of Care: Primary Care Provider AEB notes are available in epic to review.  Patient/Guardian was advised Release of Information must be obtained prior to any record release in order to collaborate their care with an outside provider. Patient/Guardian was advised if they have not already done so to contact the registration department to sign all necessary forms in order for [place] to release information regarding  their care.   Consent: Patient/Guardian gives verbal consent for treatment and assignment of benefits for services provided during this visit. Patient/Guardian expressed understanding and agreed to proceed.   I spent 26 minutes time face-to-face during this encounter.  Cleotis Nipper, MD 12/29/2023, 3:42 PM

## 2024-01-09 ENCOUNTER — Ambulatory Visit: Payer: PPO | Admitting: Nurse Practitioner

## 2024-01-16 ENCOUNTER — Ambulatory Visit: Payer: PPO | Admitting: Nurse Practitioner

## 2024-02-03 ENCOUNTER — Ambulatory Visit
Admission: RE | Admit: 2024-02-03 | Discharge: 2024-02-03 | Disposition: A | Discharge status: Home / Self Care | Source: Ambulatory Visit | Attending: Family Medicine | Admitting: Family Medicine

## 2024-02-03 VITALS — BP 166/97 | HR 87 | Temp 98.4°F | Resp 16

## 2024-02-03 DIAGNOSIS — J4521 Mild intermittent asthma with (acute) exacerbation: Secondary | ICD-10-CM

## 2024-02-03 DIAGNOSIS — J069 Acute upper respiratory infection, unspecified: Secondary | ICD-10-CM

## 2024-02-03 MED ORDER — PREDNISONE 10 MG PO TABS: 10.0000 mg | ORAL_TABLET | Freq: Every day | ORAL | 0 refills | Status: AC

## 2024-02-03 MED ORDER — ALBUTEROL SULFATE HFA 108 (90 BASE) MCG/ACT IN AERS: 1.0000 | INHALATION_SPRAY | Freq: Four times a day (QID) | RESPIRATORY_TRACT | 0 refills | Status: AC | PRN

## 2024-02-03 NOTE — Discharge Instructions (Signed)
 I refilled your albuterol inhaler as needed.  You may start prednisone daily for 5 days.  Continue Robitussin over-the-counter as needed for your cough.  Lots of rest and fluids.  Please go to the ER if you develop any worsening symptoms.  Hope you feel better soon!

## 2024-02-03 NOTE — ED Triage Notes (Signed)
 Pt presents with c/o a cough x 1 wk. Pt states she was exposed to mold. Pt states she feels nauseated today and states the cough has worsened.

## 2024-02-03 NOTE — ED Provider Notes (Addendum)
 [facility]    CSN: [id number] Arrival date & time: 02/03/24  1356      History   Chief Complaint Chief Complaint  Patient presents with   Cough    Congestion, cough History of bronchitis. Was patient of Dr Oneta Rack. Medical history is in [facility]. - Entered by patient    HPI Sarah Rodriguez is a 68 y.o. female  presents for evaluation of URI symptoms for 1 week. Patient reports associated symptoms of cough, congestion, shortness of breath. Denies N/V/D, fevers ear pain, body aches. Patient does have a hx of asthma.  States albuterol inhaler ran out.  Patient is not an active smoker.  States she gets this every year after doing her garden.  She thinks it may have been some mold on the ground as well.  Normally has to get prednisone from her PCP but was unable to see them as they recently passed away.  She has been taking Robitussin OTC for symptoms.  Pt has no other concerns at this time.    Cough Associated symptoms: shortness of breath     Past Medical History:  Diagnosis Date   Asthma    Dulera daily   Chronic back pain    HNP   Complication of anesthesia    slow to wake up   Depression    takes Clonazepam nightly   GERD (gastroesophageal reflux disease)    takes Protonix daily    Heart murmur    History of migraine    takes Relpax daily as needed   HSV (herpes simplex virus) infection    Hyperlipidemia    takes Atorvastatin daily   Hypertension    takes Benicar daily   Hypothyroidism    takes Synthroid daily   MVP (mitral valve prolapse)    Pneumonia    hx of--been many yrs ago   Seasonal allergies    takes Allegra daily   Shortness of breath    with exertion    Patient Active Problem List   Diagnosis Date Noted   Sleep apnea 06/12/2020   Deviated septum 03/14/2018   Chronic back pain 12/21/2017   Depression, major, recurrent, in partial remission (HCC) 08/05/2016   Degenerative joint disease 08/05/2016   Migraine 03/03/2016    Spondylolisthesis at L4-L5 level 10/27/2015   Vitamin D deficiency 04/17/2014   Medication management 04/17/2014   Hyperlipidemia, mixed    Hypertension    Asthma    GERD (gastroesophageal reflux disease)    Hypothyroidism     Past Surgical History:  Procedure Laterality Date   ABLATION  2011   Uterine   buttocks surgery     at age 38 (coccyx repair)   CERVICAL FUSION  03/2010   CESAREAN SECTION  1992   CHOLECYSTECTOMY  2008   COLONOSCOPY     DILATION AND CURETTAGE OF UTERUS  2011   ESOPHAGOGASTRODUODENOSCOPY     LUMBAR LAMINECTOMY/DECOMPRESSION MICRODISCECTOMY  10/24/2012   Procedure: LUMBAR LAMINECTOMY/DECOMPRESSION MICRODISCECTOMY 1 LEVEL;  Surgeon: Barnett Abu, MD;  Location: [facility];  Service: Neurosurgery;  Laterality: Right;  Right Lumbar five-Sacral One Microdiskectomy   right knee arthroscopy     x 3   TONSILLECTOMY     at age 89    OB History   No obstetric history on file.      Home Medications    Prior to Admission medications   Medication Sig Start Date End Date Taking? Authorizing Provider  albuterol (VENTOLIN HFA) 108 (90  Base) MCG/ACT inhaler Inhale 1-2 puffs into the lungs every 6 (six) hours as needed. 02/03/24  Yes Radford Pax, NP  predniSONE (DELTASONE) 10 MG tablet Take 1 tablet (10 mg total) by mouth daily with breakfast for 5 days. 02/03/24 02/08/24 Yes Radford Pax, NP  amitriptyline (ELAVIL) 25 MG tablet Take 1 tablet (25 mg total) by mouth at bedtime. 12/29/23   Arfeen, Phillips Grout, MD  Cholecalciferol (VITAMIN D PO) Take 4,000 Units by mouth daily.     [provider]  CINNAMON PO Take 1,000 Units by mouth 2 (two) times daily.     [provider]  clonazePAM (KLONOPIN) 0.5 MG tablet Take 1/2 to 1 tablet as needed for severe anxiety. 12/29/23   Arfeen, Phillips Grout, MD  Cyanocobalamin (B-12 PO) Take by mouth.    [provider]  Cyanocobalamin (B-12) 2500 MCG TABS Take by mouth.    [provider]  DULoxetine  (CYMBALTA) 20 MG capsule Take 2 capsules (40 mg total) by mouth daily. 12/29/23 03/28/24  Arfeen, Phillips Grout, MD  fluticasone (FLONASE) 50 MCG/ACT nasal spray SHAKE LIQUID AND USE 2 SPRAYS IN EACH NOSTRIL EVERY EVENING 09/12/23   Adela Glimpse, NP  fluticasone-salmeterol (ADVAIR HFA) 115-21 MCG/ACT inhaler Use  2 inhalations  15 minutes apart  2 x /day (every 12 hours) 11/28/23   Adela Glimpse, NP  lamoTRIgine (LAMICTAL) 25 MG tablet Take 1 tablet (25 mg total) by mouth 2 (two) times daily. 12/29/23   Arfeen, Phillips Grout, MD  levothyroxine (SYNTHROID) 25 MCG tablet Take  1 tablet  Daily  on an empty stomach with only water for 30 minutes & no Antacid meds, Calcium or Magnesium for 4 hours & avoid Biotin  (  Dose change from 50 to 25 mcg ) 03/29/23   Adela Glimpse, NP  losartan (COZAAR) 50 MG tablet Take  1 tablet  Daily  for BP         /       TAKE      BY      MOUTH 12/06/23   Lucky Cowboy, MD  Magnesium 250 MG TABS Take 3 tablets by mouth daily.     [provider]  meloxicam (MOBIC) 15 MG tablet Take  1 tablet Daily with Food for Pain & Inflammation                                            /                      TAKE                   BY                      MOUTH 03/01/23   Cranford, Archie Patten, NP  montelukast (SINGULAIR) 10 MG tablet Take 1 tablet Daily for Allergies                                                                              /  TAKE                                        BY                                  MOUTH 12/16/22   Adela Glimpse, NP  nystatin (MYCOSTATIN) 100000 UNIT/ML suspension SWISH AND SWALLOW BY MOUTH FOUR TIMES A DAY FOR THRUSH OR YEAST INFECTION 10/31/23   Raynelle Dick, NP  pantoprazole (PROTONIX) 40 MG tablet TAKE 1 TABLET BY MOUTH DAILY 02/22/23   Adela Glimpse, NP  PREMARIN vaginal cream INSERT 1/2 GRAM VAGINALLY VIA APPLICATOR NIGHTLY FOR W EEKS THEN JUST DO TEICE A WEEK 10/25/17    [provider]  promethazine-dextromethorphan (PROMETHAZINE-DM) 6.25-15 MG/5ML syrup Take 2.5 mLs by mouth 4 (four) times daily as needed for cough. Patient not taking: Reported on 10/13/2023 12/16/22   Adela Glimpse, NP  rosuvastatin (CRESTOR) 40 MG tablet Take  1 tablet  2 x /week  for Cholesterol                                                      /                                 TAKE                  BY                MOUTH 12/19/23   Lucky Cowboy, MD  Spacer/Aero-Holding Chambers (AEROCHAMBER MV) inhaler Use as instructed with inhaler. 09/14/23   Adela Glimpse, NP    Family History Family History  Problem Relation Age of Onset   Arthritis Mother    Heart attack Mother    Depression Mother    Allergies Mother    Hypertension Mother    Heart attack Father     Social History Social History   Tobacco Use   Smoking status: Former   Smokeless tobacco: Never   Tobacco comments:    quit at age 37  Vaping Use   Vaping status: Never Used  Substance Use Topics   Alcohol use: No    Comment: 2-3 times a yr   Drug use: No     Allergies   Demerol [meperidine], Epinephrine, and Niacin and related   Review of Systems Review of Systems  HENT:  Positive for congestion.   Respiratory:  Positive for cough and shortness of breath.      Physical Exam Triage Vital Signs ED Triage Vitals [02/03/24 1403]  Encounter Vitals Group     BP (!) 166/97     Systolic BP Percentile      Diastolic BP Percentile      Pulse Rate 87     Resp 16     Temp 98.4 F (36.9 C)     Temp Source Oral     SpO2 99 %     Weight      Height      Head Circumference      Peak Flow  Pain Score 0     Pain Loc      Pain Education      Exclude from Growth Chart    No data found.  Updated Vital Signs BP (!) 166/97 (BP Location: Left Arm)   Pulse 87   Temp 98.4 F (36.9 C) (Oral)   Resp 16   SpO2 99%   Visual Acuity Right Eye Distance:   Left Eye Distance:   Bilateral  Distance:    Right Eye Near:   Left Eye Near:    Bilateral Near:     Physical Exam Vitals and nursing note reviewed.  Constitutional:      General: She is not in acute distress.    Appearance: She is well-developed. She is not ill-appearing.  HENT:     Head: Normocephalic and atraumatic.     Right Ear: Tympanic membrane and ear canal normal.     Left Ear: Tympanic membrane and ear canal normal.     Nose: Congestion present.     Mouth/Throat:     Mouth: Mucous membranes are moist.     Pharynx: Oropharynx is clear. Uvula midline. No oropharyngeal exudate or posterior oropharyngeal erythema.     Tonsils: No tonsillar exudate or tonsillar abscesses.  Eyes:     Conjunctiva/sclera: Conjunctivae normal.     Pupils: Pupils are equal, round, and reactive to light.  Cardiovascular:     Rate and Rhythm: Normal rate and regular rhythm.     Heart sounds: Normal heart sounds.  Pulmonary:     Effort: Pulmonary effort is normal.     Breath sounds: Normal breath sounds.  Musculoskeletal:     Cervical back: Normal range of motion and neck supple.  Lymphadenopathy:     Cervical: No cervical adenopathy.  Skin:    General: Skin is warm and dry.  Neurological:     General: No focal deficit present.     Mental Status: She is alert and oriented to person, place, and time.  Psychiatric:        Mood and Affect: Mood normal.        Behavior: Behavior normal.      UC Treatments / Results  Labs (all labs ordered are listed, but only abnormal results are displayed) Labs Reviewed - No data to display  EKG   Radiology No results found.  Procedures Procedures (including critical care time)  Medications Ordered in UC Medications - No data to display  Initial Impression / Assessment and Plan / UC Course  I have reviewed the triage vital signs and the nursing notes.  Pertinent labs & imaging results that were available during my care of the patient were reviewed by me and considered in  my medical decision making (see chart for details).     Reviewed exam and symptoms with patient.  No red flags.  Refilled albuterol inhaler.  Patient reports she typically needs prednisone but has to do a low-dose steroid to her anxiety.  Will do 10 mg daily for 5 days.  Continue Robitussin as needed.  Advise rest and fluids.  Patient is process of trying to reestablish with a PCP due to her current one passing away.  ER precautions reviewed patient verbalized understanding. Final Clinical Impressions(s) / UC Diagnoses   Final diagnoses:  Mild intermittent asthma with acute exacerbation  Viral upper respiratory illness     Discharge Instructions      I refilled your albuterol inhaler as needed.  You may start prednisone daily for  5 days.  Continue Robitussin over-the-counter as needed for your cough.  Lots of rest and fluids.  Please go to the ER if you develop any worsening symptoms.  Hope you feel better soon!    ED Prescriptions     Medication Sig Dispense Auth. Provider   albuterol (VENTOLIN HFA) 108 (90 Base) MCG/ACT inhaler Inhale 1-2 puffs into the lungs every 6 (six) hours as needed. 1 each Radford Pax, NP   predniSONE (DELTASONE) 10 MG tablet Take 1 tablet (10 mg total) by mouth daily with breakfast for 5 days. 5 tablet Radford Pax, NP      PDMP not reviewed this encounter.   Radford Pax, NP 02/03/24 1450    Radford Pax, NP 02/03/24 [phone]

## 2024-02-07 ENCOUNTER — Other Ambulatory Visit (HOSPITAL_COMMUNITY): Payer: Self-pay | Admitting: Psychiatry

## 2024-02-07 DIAGNOSIS — F331 Major depressive disorder, recurrent, moderate: Secondary | ICD-10-CM

## 2024-02-20 ENCOUNTER — Other Ambulatory Visit: Payer: Self-pay | Admitting: Family Medicine

## 2024-02-20 DIAGNOSIS — K219 Gastro-esophageal reflux disease without esophagitis: Secondary | ICD-10-CM

## 2024-02-20 NOTE — Telephone Encounter (Signed)
 Copied from CRM [id number]. Topic: Clinical - Medication Refill >> Feb 20, 2024  1:48 PM Kathryne Eriksson wrote: Most Recent Primary Care Visit:   Medication: pantoprazole (PROTONIX) 40 MG tablet , meloxicam (MOBIC) 15 MG tablet   Has the patient contacted their pharmacy? Yes (Agent: If no, request that the patient contact the pharmacy for the refill. If patient does not wish to contact the pharmacy document the reason why and proceed with request.) (Agent: If yes, when and what did the pharmacy advise?)  Is this the correct pharmacy for this prescription? Yes If no, delete pharmacy and type the correct one.  This is the patient's preferred pharmacy:  [facility] [id number] [place], [place] - [address] [place] [address] Phone: [phone] Fax: [phone]   Has the prescription been filled recently? No  Is the patient out of the medication? Yes  Has the patient been seen for an appointment in the last year OR does the patient have an upcoming appointment? Yes  Can we respond through MyChart? Yes  Agent: Please be advised that Rx refills may take up to 3 business days. We ask that you follow-up with your pharmacy.

## 2024-02-21 NOTE — Telephone Encounter (Signed)
 Unable to fill medication at present time due to not established pt: will establish as pt on 03/09/2024

## 2024-02-23 ENCOUNTER — Other Ambulatory Visit: Payer: Self-pay | Admitting: Family

## 2024-02-23 ENCOUNTER — Telehealth: Payer: Self-pay | Admitting: Internal Medicine

## 2024-02-23 DIAGNOSIS — K219 Gastro-esophageal reflux disease without esophagitis: Secondary | ICD-10-CM

## 2024-02-23 MED ORDER — PANTOPRAZOLE SODIUM 40 MG PO TBEC: DELAYED_RELEASE_TABLET | ORAL | 3 refills | Status: AC

## 2024-02-23 MED ORDER — MELOXICAM 15 MG PO TABS: ORAL_TABLET | ORAL | 3 refills | Status: AC

## 2024-02-23 NOTE — Telephone Encounter (Unsigned)
 Copied from CRM [id number]. Topic: Clinical - Prescription Issue >> Feb 23, 2024 12:03 PM Alcus Dad wrote: Reason for CRM: Patient is having issues with medication. Patient only have one pill left and her appt is on April, 11. Patient stated she really need some extra pills (pantoprazole (PROTONIX) 40 MG tablet , meloxicam (MOBIC)) 15 MG tablet. Please contact patient regarding this matter

## 2024-03-07 ENCOUNTER — Other Ambulatory Visit (HOSPITAL_COMMUNITY): Payer: Self-pay | Admitting: Psychiatry

## 2024-03-07 DIAGNOSIS — F331 Major depressive disorder, recurrent, moderate: Secondary | ICD-10-CM

## 2024-03-08 ENCOUNTER — Ambulatory Visit (INDEPENDENT_AMBULATORY_CARE_PROVIDER_SITE_OTHER): Payer: PPO | Admitting: Otolaryngology

## 2024-03-08 ENCOUNTER — Encounter (INDEPENDENT_AMBULATORY_CARE_PROVIDER_SITE_OTHER): Payer: Self-pay

## 2024-03-08 VITALS — BP 164/82 | HR 98 | Ht 61.0 in | Wt 102.0 lb

## 2024-03-08 DIAGNOSIS — R42 Dizziness and giddiness: Secondary | ICD-10-CM

## 2024-03-08 DIAGNOSIS — H903 Sensorineural hearing loss, bilateral: Secondary | ICD-10-CM

## 2024-03-08 DIAGNOSIS — H6983 Other specified disorders of Eustachian tube, bilateral: Secondary | ICD-10-CM

## 2024-03-08 DIAGNOSIS — H6980 Other specified disorders of Eustachian tube, unspecified ear: Secondary | ICD-10-CM

## 2024-03-08 NOTE — Progress Notes (Signed)
 Patient ID: Sarah Rodriguez, female   DOB: [birth year], 68 y.o.   MRN: [id number]  Follow-up: Recurrent dizziness, hearing loss  HPI: The patient is a 68 year old female who returns today for her follow-up evaluation.  She was previously seen for recurrent dizziness and bilateral hearing loss.  Her dizziness was treated with vestibular/physical therapy.  The patient returns today reporting improvement in her dizziness.  Her balance has improved.  However, she is having difficulty with her hearing.  She also has occasional clogging sensation in her ears.  She was fitted with bilateral hearing aids.  She denies any otalgia, otorrhea, or vertigo.  Exam: General: Communicates without difficulty, well nourished, no acute distress. Head: Normocephalic, no evidence injury, no tenderness, facial buttresses intact without stepoff. Face/sinus: No tenderness to palpation and percussion. Facial movement is normal and symmetric. Eyes: PERRL, EOMI. No scleral icterus, conjunctivae clear. Neuro: CN II exam reveals vision grossly intact.  No nystagmus at any point of gaze. Auricles: Intact without lesions. EAC: Both ear canals and tympanic membranes are intact and mobile.  Nose: External evaluation reveals normal support and skin without lesions.  Dorsum is intact.  Anterior rhinoscopy reveals congested mucosa over anterior aspect of inferior turbinates and intact septum.  No purulence noted. Oral:  Oral cavity and oropharynx are intact, symmetric, without erythema or edema.  Mucosa is moist without lesions. Neck: Full range of motion without pain.  There is no significant lymphadenopathy.  No masses palpable.  Thyroid bed within normal limits to palpation.  Parotid glands and submandibular glands equal bilaterally without mass.  Trachea is midline. Neuro:  CN 2-12 grossly intact. Vestibular: No nystagmus at any point of gaze.   Assessment: 1.  The patient's dizziness has improved with physical/vestibular therapy. 2.  The  patient's ear canals, tympanic membranes, and middle ear spaces are normal.  3.  Bilateral mild to moderate sensorineural hearing loss. 4.  Her history is also consistent with eustachian tube dysfunction.  Plan: 1.  The physical exam findings are reviewed with the patient. 2.  The pathophysiology of vestibular dysfunction and dizziness are discussed extensively with the patient. Questions are invited and answered.   3.  Continue the use of her hearing aids. 4.  Flonase nasal spray and antihistamine as needed to treat her eustachian tube dysfunction.  Valsalva exercises encouraged.   5.  The patient will return for reevaluation in 1 year.

## 2024-03-09 ENCOUNTER — Telehealth: Payer: Self-pay

## 2024-03-09 ENCOUNTER — Ambulatory Visit: Payer: PPO | Admitting: Family Medicine

## 2024-03-09 ENCOUNTER — Encounter: Payer: Self-pay | Admitting: Family Medicine

## 2024-03-09 VITALS — BP 136/78 | HR 75 | Temp 97.6°F | Ht 61.0 in | Wt 102.0 lb

## 2024-03-09 DIAGNOSIS — E559 Vitamin D deficiency, unspecified: Secondary | ICD-10-CM | POA: Diagnosis not present

## 2024-03-09 DIAGNOSIS — E782 Mixed hyperlipidemia: Secondary | ICD-10-CM

## 2024-03-09 DIAGNOSIS — E538 Deficiency of other specified B group vitamins: Secondary | ICD-10-CM

## 2024-03-09 DIAGNOSIS — I10 Essential (primary) hypertension: Secondary | ICD-10-CM | POA: Diagnosis not present

## 2024-03-09 DIAGNOSIS — M79604 Pain in right leg: Secondary | ICD-10-CM

## 2024-03-09 DIAGNOSIS — M79605 Pain in left leg: Secondary | ICD-10-CM

## 2024-03-09 DIAGNOSIS — E039 Hypothyroidism, unspecified: Secondary | ICD-10-CM | POA: Diagnosis not present

## 2024-03-09 DIAGNOSIS — J4520 Mild intermittent asthma, uncomplicated: Secondary | ICD-10-CM

## 2024-03-09 DIAGNOSIS — J302 Other seasonal allergic rhinitis: Secondary | ICD-10-CM

## 2024-03-09 DIAGNOSIS — M150 Primary generalized (osteo)arthritis: Secondary | ICD-10-CM

## 2024-03-09 DIAGNOSIS — K5909 Other constipation: Secondary | ICD-10-CM

## 2024-03-09 DIAGNOSIS — M4316 Spondylolisthesis, lumbar region: Secondary | ICD-10-CM

## 2024-03-09 LAB — VITAMIN B12: Vitamin B-12: >1537 pg/mL — ABNORMAL HIGH (ref 211–911)

## 2024-03-09 LAB — COMPREHENSIVE METABOLIC PANEL WITH GFR
ALT: 27 U/L (ref 0–35)
AST: 25 U/L (ref 0–37)
Albumin: 5 g/dL (ref 3.5–5.2)
Alkaline Phosphatase: 57 U/L (ref 39–117)
BUN: 17 mg/dL (ref 6–23)
CO2: 32 meq/L (ref 19–32)
Calcium: 9.8 mg/dL (ref 8.4–10.5)
Chloride: 103 meq/L (ref 96–112)
Creatinine, Ser: 0.92 mg/dL (ref 0.40–1.20)
GFR: 64.69 mL/min (ref >60.00)
Glucose, Bld: 106 mg/dL — ABNORMAL HIGH (ref 70–99)
Potassium: 4.4 meq/L (ref 3.5–5.1)
Sodium: 142 meq/L (ref 135–145)
Total Bilirubin: 0.5 mg/dL (ref 0.2–1.2)
Total Protein: 7.2 g/dL (ref 6.0–8.3)

## 2024-03-09 LAB — CBC WITH DIFFERENTIAL/PLATELET
Basophils Absolute: 0 10*3/uL (ref 0.0–0.1)
Basophils Relative: 0.4 % (ref 0.0–3.0)
Eosinophils Absolute: 0.1 10*3/uL (ref 0.0–0.7)
Eosinophils Relative: 1.4 % (ref 0.0–5.0)
HCT: 44.2 % (ref 36.0–46.0)
Hemoglobin: 14.7 g/dL (ref 12.0–15.0)
Lymphocytes Relative: 24.7 % (ref 12.0–46.0)
Lymphs Abs: 1.4 10*3/uL (ref 0.7–4.0)
MCHC: 33.2 g/dL (ref 30.0–36.0)
MCV: 97.5 fl (ref 78.0–100.0)
Monocytes Absolute: 0.4 10*3/uL (ref 0.1–1.0)
Monocytes Relative: 7 % (ref 3.0–12.0)
Neutro Abs: 3.9 10*3/uL (ref 1.4–7.7)
Neutrophils Relative %: 66.5 % (ref 43.0–77.0)
Platelets: 257 10*3/uL (ref 150.0–400.0)
RBC: 4.54 Mil/uL (ref 3.87–5.11)
RDW: 12.4 % (ref 11.5–15.5)
WBC: 5.8 10*3/uL (ref 4.0–10.5)

## 2024-03-09 LAB — CK: Total CK: 36 U/L (ref 7–177)

## 2024-03-09 LAB — MAGNESIUM: Magnesium: 2.1 mg/dL (ref 1.5–2.5)

## 2024-03-09 LAB — TSH: TSH: 1.44 u[IU]/mL (ref 0.35–5.50)

## 2024-03-09 LAB — T4, FREE: Free T4: 0.84 ng/dL (ref 0.60–1.60)

## 2024-03-09 LAB — VITAMIN D 25 HYDROXY (VIT D DEFICIENCY, FRACTURES): VITD: 108.65 ng/mL (ref 30.00–100.00)

## 2024-03-09 MED ORDER — MONTELUKAST SODIUM 10 MG PO TABS: ORAL_TABLET | ORAL | 3 refills | Status: AC

## 2024-03-09 MED ORDER — LOSARTAN POTASSIUM 50 MG PO TABS: ORAL_TABLET | ORAL | 1 refills | Status: DC

## 2024-03-09 NOTE — Telephone Encounter (Signed)
 CRITICAL VALUE STICKER  CRITICAL VALUE: vit D 108.65  RECEIVER (on-site recipient of call): Dahlia Client  DATE & TIME NOTIFIED: 03/09/24 at 3:53 pm  MESSENGER (representative from lab): Hope  NOTIFIED: Hetty Blend

## 2024-03-09 NOTE — Patient Instructions (Signed)
 Please go downstairs for labs.

## 2024-03-09 NOTE — Telephone Encounter (Signed)
 Called pt and advised of lab results and critical value. Pt verbalized understanding and states she will stop her supplement

## 2024-03-09 NOTE — Progress Notes (Signed)
 New Patient Office Visit  Subjective    Patient ID: Sarah Rodriguez, female    DOB: 01-Mar-1956  Age: 68 y.o. MRN: [id number]  CC:  Chief Complaint  Patient presents with   Establish Care    Muscles loss    HPI [name] presents to establish care Previous PCP: Dr. Cassondra Cliff    Other providers: Dr. Carlos Chesterfield- psychiatrist  Dr. Johnston Nao- OB/GYN  Dr Adelaide Holy- dental appliance for OSA  Dr. Darlin Ehrlich- ENT  Dr. Barbar Bonus    C/o bilateral leg pain  Hearing loss - has hearing aids   Hx of neck and back pain with surgeries   Prediabetes and HLD   Levothyroxine 25 mcg   She is under the care of Dr. Arfeen for anxiety and panic disorder   She is taking Lamictal, Klonopin, Cymbalta, and amitriptyline   She used to work as a Engineer, civil (consulting)   Married   Outpatient Encounter Medications as of 03/09/2024  Medication Sig   albuterol (VENTOLIN HFA) 108 (90 Base) MCG/ACT inhaler Inhale 1-2 puffs into the lungs every 6 (six) hours as needed.   amitriptyline (ELAVIL) 25 MG tablet Take 1 tablet (25 mg total) by mouth at bedtime.   Cholecalciferol (VITAMIN D PO) Take 4,000 Units by mouth daily.    CINNAMON PO Take 1,000 Units by mouth 2 (two) times daily.    clonazePAM (KLONOPIN) 0.5 MG tablet Take 1/2 to 1 tablet as needed for severe anxiety.   Cyanocobalamin (B-12 PO) Take by mouth.   Cyanocobalamin (B-12) 2500 MCG TABS Take by mouth.   DULoxetine (CYMBALTA) 20 MG capsule Take 2 capsules (40 mg total) by mouth daily.   Fexofenadine-Pseudoephedrine (ALLEGRA-D PO) Take by mouth.   fluticasone (FLONASE) 50 MCG/ACT nasal spray SHAKE LIQUID AND USE 2 SPRAYS IN EACH NOSTRIL EVERY EVENING   fluticasone-salmeterol (ADVAIR HFA) 115-21 MCG/ACT inhaler Use  2 inhalations  15 minutes apart  2 x /day (every 12 hours)   lamoTRIgine (LAMICTAL) 25 MG tablet Take 1 tablet (25 mg total) by mouth 2 (two) times daily.   levothyroxine (SYNTHROID) 25 MCG tablet Take  1 tablet  Daily  on an empty stomach with only  water for 30 minutes & no Antacid meds, Calcium or Magnesium for 4 hours & avoid Biotin  (  Dose change from 50 to 25 mcg )   Magnesium 250 MG TABS Take 3 tablets by mouth daily.    meloxicam (MOBIC) 15 MG tablet Take  1 tablet Daily with Food for Pain & Inflammation                                            /                      TAKE                   BY                      MOUTH   Multiple Vitamin (MULTI VITAMIN DAILY PO) Take by mouth.   nystatin (MYCOSTATIN) 100000 UNIT/ML suspension SWISH AND SWALLOW BY MOUTH FOUR TIMES A DAY FOR THRUSH OR YEAST INFECTION   pantoprazole (PROTONIX) 40 MG tablet TAKE 1 TABLET BY MOUTH DAILY   PREMARIN vaginal cream INSERT 1/2 GRAM VAGINALLY VIA APPLICATOR NIGHTLY  FOR W EEKS THEN JUST DO TEICE A WEEK   promethazine-dextromethorphan (PROMETHAZINE-DM) 6.25-15 MG/5ML syrup Take 2.5 mLs by mouth 4 (four) times daily as needed for cough.   rosuvastatin (CRESTOR) 40 MG tablet Take  1 tablet  2 x /week  for Cholesterol                                                      /                                 TAKE                  BY                MOUTH   Spacer/Aero-Holding Chambers (AEROCHAMBER MV) inhaler Use as instructed with inhaler.   [DISCONTINUED] losartan (COZAAR) 50 MG tablet Take  1 tablet  Daily  for BP         /       TAKE      BY      MOUTH   [DISCONTINUED] montelukast (SINGULAIR) 10 MG tablet Take 1 tablet Daily for Allergies                                                                              /                                                                          TAKE                                        BY                                  MOUTH   losartan (COZAAR) 50 MG tablet Take 1 tablet daily   montelukast (SINGULAIR) 10 MG tablet Take 1 tablet daily for allergies   No facility-administered encounter medications on file as of 03/09/2024.    Past Medical History:  Diagnosis Date   Asthma    Dulera daily   Chronic back pain     HNP   Complication of anesthesia    slow to wake up   Depression    takes Clonazepam nightly   GERD (gastroesophageal reflux disease)    takes Protonix daily    Heart murmur    History of migraine    takes Relpax daily as needed   HSV (herpes simplex virus) infection    Hyperlipidemia    takes Atorvastatin daily   Hypertension  takes Benicar daily   Hypothyroidism    takes Synthroid daily   MVP (mitral valve prolapse)    Pneumonia    hx of--been many yrs ago   Seasonal allergies    takes Allegra daily   Shortness of breath    with exertion    Past Surgical History:  Procedure Laterality Date   ABLATION  2011   Uterine   buttocks surgery     at age 2 (coccyx repair)   CERVICAL FUSION  03/2010   CESAREAN SECTION  1992   CHOLECYSTECTOMY  2008   COLONOSCOPY     DILATION AND CURETTAGE OF UTERUS  2011   ESOPHAGOGASTRODUODENOSCOPY     LUMBAR LAMINECTOMY/DECOMPRESSION MICRODISCECTOMY  10/24/2012   Procedure: LUMBAR LAMINECTOMY/DECOMPRESSION MICRODISCECTOMY 1 LEVEL;  Surgeon: Elna Haggis, MD;  Location: [facility];  Service: Neurosurgery;  Laterality: Right;  Right Lumbar five-Sacral One Microdiskectomy   right knee arthroscopy     x 3   TONSILLECTOMY     at age 85    Family History  Problem Relation Age of Onset   Arthritis Mother    Heart attack Mother    Depression Mother    Allergies Mother    Hypertension Mother    Heart attack Father     Social History   Socioeconomic History   Marital status: Married    Spouse name: Not on file   Number of children: Not on file   Years of education: Not on file   Highest education level: Bachelor's degree (e.g., BA, AB, BS)  Occupational History   Not on file  Tobacco Use   Smoking status: Former   Smokeless tobacco: Never   Tobacco comments:    quit at age 33  Vaping Use   Vaping status: Never Used  Substance and Sexual Activity   Alcohol use: No    Comment: 2-3 times a yr   Drug use: No   Sexual  activity: Yes    Birth control/protection: Post-menopausal  Other Topics Concern   Not on file  Social History Narrative   Not on file   Social Drivers of Health   Financial Resource Strain: Low Risk  (03/09/2024)   Overall Financial Resource Strain (CARDIA)    Difficulty of Paying Living Expenses: Not very hard  Food Insecurity: No Food Insecurity (03/09/2024)   Hunger Vital Sign    Worried About Running Out of Food in the Last Year: Never true    Ran Out of Food in the Last Year: Never true  Transportation Needs: No Transportation Needs (03/09/2024)   PRAPARE - Administrator, Civil Service (Medical): No    Lack of Transportation (Non-Medical): No  Physical Activity: Insufficiently Active (03/09/2024)   Exercise Vital Sign    Days of Exercise per Week: 3 days    Minutes of Exercise per Session: 10 min  Stress: Stress Concern Present (03/09/2024)   [employer] of Occupational Health - Occupational Stress Questionnaire    Feeling of Stress : To some extent  Social Connections: Moderately Isolated (03/09/2024)   Social Connection and Isolation Panel [NHANES]    Frequency of Communication with Friends and Family: Once a week    Frequency of Social Gatherings with Friends and Family: Once a week    Attends Religious Services: 1 to 4 times per year    Active Member of [employer] or Organizations: No    Attends Engineer, structural: Not on file    Marital Status: Married  Intimate Partner Violence: Unknown (03/31/2023)   Received from [facility]   HITS    Physically Hurt: Not on file    Insult or Talk Down To: Not on file    Threaten Physical Harm: Not on file    Scream or Curse: Not on file    Review of Systems  Constitutional:  Negative for chills and fever.  Respiratory:  Negative for shortness of breath.   Cardiovascular:  Negative for chest pain, palpitations and leg swelling.  Gastrointestinal:  Negative for abdominal pain, constipation,  diarrhea, nausea and vomiting.  Genitourinary:  Negative for dysuria, frequency and urgency.  Musculoskeletal:  Positive for back pain, joint pain and myalgias.  Neurological:  Negative for dizziness and focal weakness.        Objective    BP 136/78 (BP Location: Left Arm, Patient Position: Sitting)   Pulse 75   Temp 97.6 F (36.4 C) (Temporal)   Ht 5\' 1"  (1.549 m)   Wt 102 lb (46.3 kg)   SpO2 100%   BMI 19.27 kg/m   Physical Exam Constitutional:      General: She is not in acute distress.    Appearance: She is not ill-appearing.  HENT:     Mouth/Throat:     Mouth: Mucous membranes are moist.  Eyes:     Extraocular Movements: Extraocular movements intact.     Conjunctiva/sclera: Conjunctivae normal.  Cardiovascular:     Rate and Rhythm: Normal rate and regular rhythm.  Pulmonary:     Effort: Pulmonary effort is normal.     Breath sounds: Normal breath sounds.  Musculoskeletal:     Cervical back: Normal range of motion and neck supple.     Right lower leg: No edema.     Left lower leg: No edema.  Skin:    General: Skin is warm and dry.  Neurological:     General: No focal deficit present.     Mental Status: She is alert and oriented to person, place, and time.     Motor: No weakness.     Coordination: Coordination normal.     Gait: Gait normal.  Psychiatric:        Mood and Affect: Mood normal.        Behavior: Behavior normal.        Thought Content: Thought content normal.         Assessment & Plan:   Problem List Items Addressed This Visit     Asthma   Relevant Medications   montelukast (SINGULAIR) 10 MG tablet   B12 deficiency   Relevant Orders   Vitamin B12 (Completed)   Bilateral leg pain   Relevant Orders   TSH (Completed)   T4, free (Completed)   CBC with Differential/Platelet (Completed)   Comprehensive metabolic panel with GFR (Completed)   CK (Creatine Kinase) (Completed)   Magnesium (Completed)   Chronic constipation    Degenerative joint disease   Essential hypertension - Primary   Relevant Medications   losartan (COZAAR) 50 MG tablet   Other Relevant Orders   CBC with Differential/Platelet (Completed)   Comprehensive metabolic panel with GFR (Completed)   Hyperlipidemia, mixed   Relevant Medications   losartan (COZAAR) 50 MG tablet   Hypothyroidism   Relevant Orders   TSH (Completed)   T4, free (Completed)   Spondylolisthesis at L4-L5 level   Vitamin D deficiency   Relevant Orders   VITAMIN D 25 Hydroxy (Vit-D Deficiency, Fractures) (Completed)   Other  Visit Diagnoses       Seasonal allergies       Relevant Medications   montelukast (SINGULAIR) 10 MG tablet      She is here to establish care. Reviewed previous PCP notes and results.  Check labs today for chronic health conditions.  Monitor BP at home.  Follow up here in 6 weeks fasting.   Critical vitamin D level, elevated. Patient contacted to stop vitamin D supplement.   Return in about 6 weeks (around 04/20/2024) for Fasting follow up.   Alyson Back, NP-C

## 2024-03-10 DIAGNOSIS — H6983 Other specified disorders of Eustachian tube, bilateral: Secondary | ICD-10-CM | POA: Insufficient documentation

## 2024-03-10 DIAGNOSIS — R42 Dizziness and giddiness: Secondary | ICD-10-CM | POA: Insufficient documentation

## 2024-03-10 DIAGNOSIS — H903 Sensorineural hearing loss, bilateral: Secondary | ICD-10-CM | POA: Insufficient documentation

## 2024-03-11 ENCOUNTER — Encounter: Payer: Self-pay | Admitting: Family Medicine

## 2024-03-16 ENCOUNTER — Ambulatory Visit (INDEPENDENT_AMBULATORY_CARE_PROVIDER_SITE_OTHER): Admitting: Audiology

## 2024-03-16 DIAGNOSIS — H903 Sensorineural hearing loss, bilateral: Secondary | ICD-10-CM

## 2024-03-16 NOTE — Progress Notes (Signed)
  [address] [place], [place] [address] [phone]  Audiological Evaluation    Name: Sarah Rodriguez     DOB:   06-Sep-1956      MRN:   [id number]                                                                                     Service Date: 03/16/2024     Accompanied by: husband   Patient comes today after Dr. Darlin Ehrlich, ENT sent a referral for a hearing evaluation due to concerns with hearing loss.   Symptoms Yes Details  Hearing loss  [x]  Bilateral, thinks she is having more trouble hearing  Tinnitus  [x]  Reports a loud shrill noise in both ears  Ear pain/ infections/pressure  [x]  Clogged sensation in both ears.  Balance problems  [x]  Has been to physical therapy due to "proprioceptive disorder"   Noise exposure history  [x]  Concerts when younger  Previous ear surgeries  []    Family history of hearing loss  [x]  Aunt in her 48's and mother with age  Amplification  [x]  Has a set of hearing aids - purchased elsewhere  Other  []      Otoscopy: Right ear: Clear external ear canals and notable landmarks visualized on the tympanic membrane. Left ear:  Clear external ear canals and notable landmarks visualized on the tympanic membrane.  Tympanometry: Right ear: Type A- Normal external ear canal volume with normal middle ear pressure and tympanic membrane compliance Left ear: Type A- Normal external ear canal volume with normal middle ear pressure and tympanic membrane compliance    Pure tone Audiometry:  Both ears- Mild to moderate sensorineural hearing loss from 125 Hz - 8000 Hz.   No significant threshold changes noted when compared to her previous audiograms.   Speech Audiometry: Right ear- Speech Reception Threshold (SRT) was obtained at 40 dBHL. Left ear-Speech Reception Threshold (SRT) was obtained at 40 dBHL.   Word Recognition Score Tested using NU-6 (MLV) Right ear: 40% was obtained at a presentation level of 70 dBHL with contralateral masking which is  deemed as  poor. Left ear: 40% was obtained at a presentation level of 70 dBHL with contralateral masking which is deemed as  poor.  Of note, word recognition scores dropped significantly in both ears.   The hearing test results were completed under headphones and results are deemed to be of good to fair reliability. Test technique:  conventional    Impression:   Recommendations: Follow up with ENT as scheduled for today. Return for a hearing evaluation if concerns with hearing changes arise or per MD recommendation. Recommend hearing aid check using Real Ear Measurements. Patient seems to have reduced sound tolerance levels.  Given the concerns with difficulty understanding speech , the patient may consider using communication strategies and an external microphone as an accessory for the aids.   Cayci Mcnabb MARIE LEROUX-MARTINEZ, AUD

## 2024-03-29 ENCOUNTER — Encounter (HOSPITAL_COMMUNITY): Payer: Self-pay | Admitting: Psychiatry

## 2024-03-29 ENCOUNTER — Ambulatory Visit (HOSPITAL_COMMUNITY): Payer: PPO | Admitting: Psychiatry

## 2024-03-29 DIAGNOSIS — F411 Generalized anxiety disorder: Secondary | ICD-10-CM

## 2024-03-29 DIAGNOSIS — F331 Major depressive disorder, recurrent, moderate: Secondary | ICD-10-CM

## 2024-03-29 MED ORDER — DULOXETINE HCL 20 MG PO CPEP: 40.0000 mg | ORAL_CAPSULE | Freq: Every day | ORAL | 2 refills | Status: DC

## 2024-03-29 MED ORDER — LAMOTRIGINE 25 MG PO TABS: 25.0000 mg | ORAL_TABLET | Freq: Two times a day (BID) | ORAL | 2 refills | Status: DC

## 2024-03-29 MED ORDER — AMITRIPTYLINE HCL 25 MG PO TABS: 25.0000 mg | ORAL_TABLET | Freq: Every day | ORAL | 0 refills | Status: DC

## 2024-03-29 MED ORDER — CLONAZEPAM 0.5 MG PO TABS: ORAL_TABLET | ORAL | 1 refills | Status: DC

## 2024-03-29 NOTE — Progress Notes (Signed)
 [facility] MD/PA/NP OP Progress Note   Patient Location: Office Provider Location: Office  03/29/2024 2:10 PM Sarah Rodriguez  MRN:  [id number]   HPI: Patient came today for her follow-up appointment.  She reported things are going okay except for past 2 days not able to sleep well.  She reported otherwise sleep is good and she is doing sleep hygiene.  She still struggle sometimes with lack of motivation and decreased energy but recently find out that her vitamin D  and B12 level were very high.  She saw a new primary care and had blood work.  Labs otherwise normal.  She had tried higher dose of medication in the past but did not tolerate very well.  Her anxiety is chronic and sometimes she struggle going outside by herself.  She reported her husband is very supportive.  She had a complete physical recently.  She is going to have another follow-up to see primary care to repeat vitamin D  and B12 levels.  Patient told her 59-year-old granddaughter came 2 weeks ago and she is going to see her again in few weeks.  Sometimes she struggle to make the plan because of chronic anxiety.  She is thinking to start to join [employer] a senior citizen project for a while however has not started yet.  She feels anxious around people but overall she feels the medicine working.  She takes very rarely one fourth of Klonopin  when she feels very nervous or anxious.  She is consistent with amitriptyline , Cymbalta  and Lamictal .  She has no rash or any itching.  Visit Diagnosis:    ICD-10-CM   1. GAD (generalized anxiety disorder)  F41.1     2. MDD (major depressive disorder), recurrent episode, moderate (HCC)  F33.1          Past Psychiatric History:  H/O depression and taking antidepressants since 1996.  H/O panic attacks while driving.  Saw Dr. Renetta Carter and tried Prozac, Paxil, Zoloft, Xanax, Cymbalta  and Klonopin .  No h/o inpatient treatment or any suicidal attempt.  Past Medical History:  Past Medical  History:  Diagnosis Date   Asthma    Dulera  daily   Chronic back pain    HNP   Complication of anesthesia    slow to wake up   Depression    takes Clonazepam  nightly   GERD (gastroesophageal reflux disease)    takes Protonix  daily    Heart murmur    History of migraine    takes Relpax  daily as needed   HSV (herpes simplex virus) infection    Hyperlipidemia    takes Atorvastatin  daily   Hypertension    takes Benicar  daily   Hypothyroidism    takes Synthroid  daily   MVP (mitral valve prolapse)    Pneumonia    hx of--been many yrs ago   Seasonal allergies    takes Allegra daily   Shortness of breath    with exertion    Past Surgical History:  Procedure Laterality Date   ABLATION  2011   Uterine   buttocks surgery     at age 82 (coccyx repair)   CERVICAL FUSION  03/2010   CESAREAN SECTION  1992   CHOLECYSTECTOMY  2008   COLONOSCOPY     DILATION AND CURETTAGE OF UTERUS  2011   ESOPHAGOGASTRODUODENOSCOPY     LUMBAR LAMINECTOMY/DECOMPRESSION MICRODISCECTOMY  10/24/2012   Procedure: LUMBAR LAMINECTOMY/DECOMPRESSION MICRODISCECTOMY 1 LEVEL;  Surgeon: Elna Haggis, MD;  Location: [facility];  Service: Neurosurgery;  Laterality: Right;  Right Lumbar five-Sacral One Microdiskectomy   right knee arthroscopy     x 3   TONSILLECTOMY     at age 78    Family Psychiatric History: Reviewed.  Family History:  Family History  Problem Relation Age of Onset   Arthritis Mother    Heart attack Mother    Depression Mother    Allergies Mother    Hypertension Mother    Heart attack Father     Social History:  Social History   Socioeconomic History   Marital status: Married    Spouse name: Not on file   Number of children: Not on file   Years of education: Not on file   Highest education level: Bachelor's degree (e.g., BA, AB, BS)  Occupational History   Not on file  Tobacco Use   Smoking status: Former   Smokeless tobacco: Never   Tobacco comments:    quit at age 7   Vaping Use   Vaping status: Never Used  Substance and Sexual Activity   Alcohol use: No    Comment: 2-3 times a yr   Drug use: No   Sexual activity: Yes    Birth control/protection: Post-menopausal  Other Topics Concern   Not on file  Social History Narrative   Not on file   Social Drivers of Health   Financial Resource Strain: Low Risk  (03/09/2024)   Overall Financial Resource Strain (CARDIA)    Difficulty of Paying Living Expenses: Not very hard  Food Insecurity: No Food Insecurity (03/09/2024)   Hunger Vital Sign    Worried About Running Out of Food in the Last Year: Never true    Ran Out of Food in the Last Year: Never true  Transportation Needs: No Transportation Needs (03/09/2024)   PRAPARE - Administrator, Civil Service (Medical): No    Lack of Transportation (Non-Medical): No  Physical Activity: Insufficiently Active (03/09/2024)   Exercise Vital Sign    Days of Exercise per Week: 3 days    Minutes of Exercise per Session: 10 min  Stress: Stress Concern Present (03/09/2024)   [employer] of Occupational Health - Occupational Stress Questionnaire    Feeling of Stress : To some extent  Social Connections: Moderately Isolated (03/09/2024)   Social Connection and Isolation Panel [NHANES]    Frequency of Communication with Friends and Family: Once a week    Frequency of Social Gatherings with Friends and Family: Once a week    Attends Religious Services: 1 to 4 times per year    Active Member of [employer] or Organizations: No    Attends Engineer, structural: Not on file    Marital Status: Married    Allergies:  Allergies  Allergen Reactions   Demerol [Meperidine] Other (See Comments)    REACTION: unknown--itching   Epinephrine  Other (See Comments)    "Sensitivity."   Niacin And Related Rash    Metabolic Disorder Labs: Lab Results  Component Value Date   HGBA1C 5.5 10/13/2023   MPG 111 10/13/2023   MPG 114 07/13/2023   No results found  for: "PROLACTIN" Lab Results  Component Value Date   CHOL 165 10/13/2023   TRIG 176 (H) 10/13/2023   HDL 49 (L) 10/13/2023   CHOLHDL 3.4 10/13/2023   VLDL 31 (H) 06/06/2017   LDLCALC 89 10/13/2023   LDLCALC 78 07/13/2023   Lab Results  Component Value Date   TSH 1.44 03/09/2024   TSH 1.58 10/13/2023  Therapeutic Level Labs: No results found for: "LITHIUM" No results found for: "VALPROATE" No results found for: "CBMZ"  Current Medications: Current Outpatient Medications  Medication Sig Dispense Refill   albuterol  (VENTOLIN  HFA) 108 (90 Base) MCG/ACT inhaler Inhale 1-2 puffs into the lungs every 6 (six) hours as needed. 1 each 0   amitriptyline  (ELAVIL ) 25 MG tablet Take 1 tablet (25 mg total) by mouth at bedtime. 90 tablet 0   Cholecalciferol (VITAMIN D  PO) Take 4,000 Units by mouth daily.      CINNAMON PO Take 1,000 Units by mouth 2 (two) times daily.      clonazePAM  (KLONOPIN ) 0.5 MG tablet Take 1/2 to 1 tablet as needed for severe anxiety. 30 tablet 1   Cyanocobalamin  (B-12 PO) Take by mouth.     Cyanocobalamin  (B-12) 2500 MCG TABS Take by mouth.     DULoxetine  (CYMBALTA ) 20 MG capsule Take 2 capsules (40 mg total) by mouth daily. 60 capsule 2   Fexofenadine-Pseudoephedrine  (ALLEGRA-D PO) Take by mouth.     fluticasone  (FLONASE ) 50 MCG/ACT nasal spray SHAKE LIQUID AND USE 2 SPRAYS IN EACH NOSTRIL EVERY EVENING 48 g 3   fluticasone -salmeterol (ADVAIR  HFA) 115-21 MCG/ACT inhaler Use  2 inhalations  15 minutes apart  2 x /day (every 12 hours) 36 g 3   lamoTRIgine  (LAMICTAL ) 25 MG tablet Take 1 tablet (25 mg total) by mouth 2 (two) times daily. 60 tablet 2   levothyroxine  (SYNTHROID ) 25 MCG tablet Take  1 tablet  Daily  on an empty stomach with only water for 30 minutes & no Antacid meds, Calcium  or Magnesium  for 4 hours & avoid Biotin  (  Dose change from 50 to 25 mcg ) 90 tablet 1   losartan  (COZAAR ) 50 MG tablet Take 1 tablet daily 90 tablet 1   Magnesium  250 MG TABS Take 3  tablets by mouth daily.      meloxicam  (MOBIC ) 15 MG tablet Take  1 tablet Daily with Food for Pain & Inflammation                                            /                      TAKE                   BY                      MOUTH 90 tablet 3   montelukast  (SINGULAIR ) 10 MG tablet Take 1 tablet daily for allergies 90 tablet 3   Multiple Vitamin (MULTI VITAMIN DAILY PO) Take by mouth.     nystatin  (MYCOSTATIN ) 100000 UNIT/ML suspension SWISH AND SWALLOW BY MOUTH FOUR TIMES A DAY FOR THRUSH OR YEAST INFECTION 473 mL 1   pantoprazole  (PROTONIX ) 40 MG tablet TAKE 1 TABLET BY MOUTH DAILY 90 tablet 3   PREMARIN vaginal cream INSERT 1/2 GRAM VAGINALLY VIA APPLICATOR NIGHTLY FOR W EEKS THEN JUST DO TEICE A WEEK  11   promethazine -dextromethorphan (PROMETHAZINE -DM) 6.25-15 MG/5ML syrup Take 2.5 mLs by mouth 4 (four) times daily as needed for cough. 240 mL 0   rosuvastatin  (CRESTOR ) 40 MG tablet Take  1 tablet  2 x /week  for Cholesterol                                                      /  TAKE                  BY                MOUTH 26 tablet 3   Spacer/Aero-Holding Chambers (AEROCHAMBER MV) inhaler Use as instructed with inhaler. 1 each 0   No current facility-administered medications for this visit.   Screenings: GAD-7    Advertising copywriter from 05/18/2022 in [facility] from 05/07/2021 in [facility] at [facility]  Total GAD-7 Score 5 8      PHQ2-9    Flowsheet Row Office Visit from 03/09/2024 in [facility] Office Visit from 07/13/2023 in [place] [facility] Office Visit from 01/06/2023 in [place] [facility] Office Visit from 09/30/2022 in [place] [facility] Counselor from 05/18/2022 in [facility] at [facility] Total Score 1 1  1  0 2  PHQ-9 Total Score 10 -- -- -- 6      Flowsheet Row ED from 02/03/2024 in [facility] at [employer] [facility]) Counselor from 05/18/2022 in [facility] at [facility] from 05/07/2021 in [facility] at [facility] RISK CATEGORY No Risk No Risk No Risk      Psychiatric Specialty Exam: Physical Exam  Review of Systems  Blood pressure (!) 155/71, pulse 79, height 5\' 1"  (1.549 m), weight 103 lb (46.7 kg).There is no height or weight on file to calculate BMI.  General Appearance: Casual  Eye Contact:  Good  Speech:  Normal Rate and lip reading  Volume:  Normal  Mood:  Euthymic  Affect:  Congruent  Thought Process:  Goal Directed  Orientation:  Full (Time, Place, and Person)  Thought Content:  Logical  Suicidal Thoughts:  No  Homicidal Thoughts:  No  Memory:  Immediate;   Good Recent;   Good Remote;   Good  Judgement:  Good  Insight:  Present  Psychomotor Activity:  Normal  Concentration:  Concentration: Good and Attention Span: Good  Recall:  Good  Fund of Knowledge:  Good  Language:  Good  Akathisia:  No  Handed:  Right  AIMS (if indicated):     Assets:  Communication Skills Desire for Improvement Housing Resilience Social Support Talents/Skills Transportation  ADL's:  Intact  Cognition:  WNL  Sleep:   better     Assessment and Plan:  Patient is stable on current medication.  Reviewed blood work results.  Her vitamin D  and B12 is high.  She noticed some time lack of energy.  She had discussed with her PCP about loss of muscle mass.  She does not want to change the medication.  In the past higher dose of medication did not help her.  Will continue Cymbalta  20 mg 2 times a day, Lamictal  25 mg twice a day, amitriptyline  25 mg at bedtime and Klonopin  1/4 tablet of 0.5 mg to take as needed for severe anxiety.  Encouraged to keep appointment with primary care.  Patient is hoping  stopping the B12 and folic acid may help her motivated to do things.  I offered again therapy but patient declined.  I recommend to call us  back if she is any question or any concern.  Follow-up in 3 months.  Collaboration of Care: Collaboration of Care: Primary Care Provider AEB notes are available in epic to review.  Patient/Guardian was advised Release of Information must be obtained prior to any record release in order to collaborate their care with an outside provider. Patient/Guardian was advised if they have not already done so to contact the registration department to sign all necessary forms in order for us  to release information regarding their care.   Consent: Patient/Guardian gives verbal consent for treatment and assignment of benefits for services provided during this visit. Patient/Guardian expressed understanding and agreed to proceed.   I spent 36 minutes time face-to-face during this encounter.  Arturo Late, MD 03/29/2024, 2:10 PM

## 2024-03-30 ENCOUNTER — Other Ambulatory Visit: Payer: Self-pay | Admitting: Family Medicine

## 2024-03-30 DIAGNOSIS — E039 Hypothyroidism, unspecified: Secondary | ICD-10-CM

## 2024-03-30 NOTE — Telephone Encounter (Unsigned)
 Copied from CRM [id number]. Topic: Clinical - Medication Refill >> Mar 30, 2024 11:46 AM Palma Bob wrote: Most Recent Primary Care Visit:  Provider: Abram Abraham  Department: [facility]  Visit Type: NEW PT - OFFICE VISIT  Date: 03/09/2024  Medication: levothyroxine  (SYNTHROID ) 25 MCG tablet  Has the patient contacted their pharmacy? Yes (Agent: If no, request that the patient contact the pharmacy for the refill. If patient does not wish to contact the pharmacy document the reason why and proceed with request.) (Agent: If yes, when and what did the pharmacy advise?)  Is this the correct pharmacy for this prescription? Yes If no, delete pharmacy and type the correct one.  This is the patient's preferred pharmacy:  [facility] [id number] [place], [place] - [address] [place] [address] Phone: [phone] Fax: [phone]   Has the prescription been filled recently? No  Is the patient out of the medication? Yes  Has the patient been seen for an appointment in the last year OR does the patient have an upcoming appointment? Yes  Can we respond through MyChart? Yes  Agent: Please be advised that Rx refills may take up to 3 business days. We ask that you follow-up with your pharmacy.

## 2024-04-02 MED ORDER — LEVOTHYROXINE SODIUM 25 MCG PO TABS: ORAL_TABLET | ORAL | 1 refills | Status: DC

## 2024-04-09 LAB — HM MAMMOGRAPHY

## 2024-04-11 MED ORDER — ROSUVASTATIN CALCIUM 40 MG PO TABS
ORAL_TABLET | ORAL | 1 refills | Status: AC
Start: 2024-04-11 — End: ?

## 2024-04-11 NOTE — Addendum Note (Signed)
 Addended by: Nikolus Marczak E on: 04/11/2024 04:26 PM   Modules accepted: Orders

## 2024-04-16 ENCOUNTER — Encounter: Payer: Self-pay | Admitting: Family Medicine

## 2024-04-16 DIAGNOSIS — J4520 Mild intermittent asthma, uncomplicated: Secondary | ICD-10-CM

## 2024-04-16 MED ORDER — FLUTICASONE-SALMETEROL 115-21 MCG/ACT IN AERO: INHALATION_SPRAY | RESPIRATORY_TRACT | 1 refills | Status: DC

## 2024-04-20 ENCOUNTER — Encounter: Payer: Self-pay | Admitting: Family Medicine

## 2024-04-20 ENCOUNTER — Ambulatory Visit (INDEPENDENT_AMBULATORY_CARE_PROVIDER_SITE_OTHER): Admitting: Family Medicine

## 2024-04-20 VITALS — BP 126/80 | HR 67 | Temp 97.6°F | Ht 61.0 in | Wt 100.0 lb

## 2024-04-20 DIAGNOSIS — I10 Essential (primary) hypertension: Secondary | ICD-10-CM

## 2024-04-20 DIAGNOSIS — J302 Other seasonal allergic rhinitis: Secondary | ICD-10-CM | POA: Diagnosis not present

## 2024-04-20 DIAGNOSIS — E039 Hypothyroidism, unspecified: Secondary | ICD-10-CM

## 2024-04-20 DIAGNOSIS — Z87898 Personal history of other specified conditions: Secondary | ICD-10-CM | POA: Diagnosis not present

## 2024-04-20 DIAGNOSIS — E782 Mixed hyperlipidemia: Secondary | ICD-10-CM

## 2024-04-20 DIAGNOSIS — R7989 Other specified abnormal findings of blood chemistry: Secondary | ICD-10-CM

## 2024-04-20 LAB — LIPID PANEL
Cholesterol: 149 mg/dL (ref 0–200)
HDL: 50.3 mg/dL (ref >39.00)
LDL Cholesterol: 83 mg/dL (ref 0–99)
NonHDL: 99.05
Total CHOL/HDL Ratio: 3
Triglycerides: 79 mg/dL (ref 0.0–149.0)
VLDL: 15.8 mg/dL (ref 0.0–40.0)

## 2024-04-20 LAB — HEMOGLOBIN A1C: Hgb A1c MFr Bld: 5.8 % (ref 4.6–6.5)

## 2024-04-20 NOTE — Patient Instructions (Signed)
 Please go downstairs for labs before you leave.  Will be in touch with your results and with recommendations.

## 2024-04-20 NOTE — Progress Notes (Signed)
 Subjective:     Patient ID: Sarah Rodriguez, female    DOB: 1956-09-18, 68 y.o.   MRN: [id number]  Chief Complaint  Patient presents with   Follow-up    Fasting f/u    HPI  History of Present Illness         Other providers: Dr. Arfeen- psychiatrist  Dr. Fogleman- OB/GYN  Dr Adelaide Holy- dental appliance for OSA  Dr. Darlin Ehrlich- ENT  Dr. Barbar Bonus    She is here to follow-up on chronic health conditions.  4-week fasting follow-up today check lipid panel. States she takes rosuvastatin  2x/wk   Reports history of insulin  resistance and gestational diabetes.  Her last A1c was in normal range fall 2024.  Currently dealing with allergies and sinus congestion.  Her vitamin D  level was greater than 100.  She has since stopped taking vitamin D    Reports having mammogram, Pap smear and DEXA studies all in the past few months.  Health Maintenance Due  Topic Date Due   Zoster Vaccines- Shingrix (1 of 2) Never done   DEXA SCAN  Never done   COVID-19 Vaccine (4 - 2024-25 season) 07/31/2023   Medicare Annual Wellness (AWV)  01/07/2024    Past Medical History:  Diagnosis Date   Asthma    Dulera  daily   Chronic back pain    HNP   Complication of anesthesia    slow to wake up   Depression    takes Clonazepam  nightly   GERD (gastroesophageal reflux disease)    takes Protonix  daily    Heart murmur    History of migraine    takes Relpax  daily as needed   HSV (herpes simplex virus) infection    Hyperlipidemia    takes Atorvastatin  daily   Hypertension    takes Benicar  daily   Hypothyroidism    takes Synthroid  daily   MVP (mitral valve prolapse)    Pneumonia    hx of--been many yrs ago   Seasonal allergies    takes Allegra daily   Shortness of breath    with exertion    Past Surgical History:  Procedure Laterality Date   ABLATION  2011   Uterine   buttocks surgery     at age 5 (coccyx repair)   CERVICAL FUSION  03/2010   CESAREAN SECTION  1992   CHOLECYSTECTOMY   2008   COLONOSCOPY     DILATION AND CURETTAGE OF UTERUS  2011   ESOPHAGOGASTRODUODENOSCOPY     LUMBAR LAMINECTOMY/DECOMPRESSION MICRODISCECTOMY  10/24/2012   Procedure: LUMBAR LAMINECTOMY/DECOMPRESSION MICRODISCECTOMY 1 LEVEL;  Surgeon: Elna Haggis, MD;  Location: [facility];  Service: Neurosurgery;  Laterality: Right;  Right Lumbar five-Sacral One Microdiskectomy   right knee arthroscopy     x 3   TONSILLECTOMY     at age 89    Family History  Problem Relation Age of Onset   Arthritis Mother    Heart attack Mother    Depression Mother    Allergies Mother    Hypertension Mother    Heart attack Father     Social History   Socioeconomic History   Marital status: Married    Spouse name: Not on file   Number of children: Not on file   Years of education: Not on file   Highest education level: Bachelor's degree (e.g., BA, AB, BS)  Occupational History   Not on file  Tobacco Use   Smoking status: Former   Smokeless tobacco: Never   Tobacco  comments:    quit at age 19  Vaping Use   Vaping status: Never Used  Substance and Sexual Activity   Alcohol use: No    Comment: 2-3 times a yr   Drug use: No   Sexual activity: Yes    Birth control/protection: Post-menopausal  Other Topics Concern   Not on file  Social History Narrative   Not on file   Social Drivers of Health   Financial Resource Strain: Low Risk  (03/09/2024)   Overall Financial Resource Strain (CARDIA)    Difficulty of Paying Living Expenses: Not very hard  Food Insecurity: No Food Insecurity (03/09/2024)   Hunger Vital Sign    Worried About Running Out of Food in the Last Year: Never true    Ran Out of Food in the Last Year: Never true  Transportation Needs: No Transportation Needs (03/09/2024)   PRAPARE - Administrator, Civil Service (Medical): No    Lack of Transportation (Non-Medical): No  Physical Activity: Insufficiently Active (03/09/2024)   Exercise Vital Sign    Days of Exercise per  Week: 3 days    Minutes of Exercise per Session: 10 min  Stress: Stress Concern Present (03/09/2024)   [employer] of Occupational Health - Occupational Stress Questionnaire    Feeling of Stress : To some extent  Social Connections: Moderately Isolated (03/09/2024)   Social Connection and Isolation Panel [NHANES]    Frequency of Communication with Friends and Family: Once a week    Frequency of Social Gatherings with Friends and Family: Once a week    Attends Religious Services: 1 to 4 times per year    Active Member of [employer] or Organizations: No    Attends Engineer, structural: Not on file    Marital Status: Married  Intimate Partner Violence: Unknown (03/31/2023)   Received from [employer], [facility]   HITS    Physically Hurt: Not on file    Insult or Talk Down To: Not on file    Threaten Physical Harm: Not on file    Scream or Curse: Not on file    Outpatient Medications Prior to Visit  Medication Sig Dispense Refill   albuterol  (VENTOLIN  HFA) 108 (90 Base) MCG/ACT inhaler Inhale 1-2 puffs into the lungs every 6 (six) hours as needed. 1 each 0   amitriptyline  (ELAVIL ) 25 MG tablet Take 1 tablet (25 mg total) by mouth at bedtime. 90 tablet 0   Cholecalciferol (VITAMIN D  PO) Take 4,000 Units by mouth daily.      CINNAMON PO Take 1,000 Units by mouth 2 (two) times daily.      clonazePAM  (KLONOPIN ) 0.5 MG tablet Take 1/2 to 1 tablet as needed for severe anxiety. 30 tablet 1   Cyanocobalamin  (B-12) 2500 MCG TABS Take by mouth.     DULoxetine  (CYMBALTA ) 20 MG capsule Take 2 capsules (40 mg total) by mouth daily. 60 capsule 2   Fexofenadine-Pseudoephedrine  (ALLEGRA-D PO) Take by mouth.     fluticasone  (FLONASE ) 50 MCG/ACT nasal spray SHAKE LIQUID AND USE 2 SPRAYS IN EACH NOSTRIL EVERY EVENING 48 g 3   fluticasone -salmeterol (ADVAIR  HFA) 115-21 MCG/ACT inhaler Use  2 inhalations  15 minutes apart  2 x /day (every 12 hours) 36 g 1   lamoTRIgine  (LAMICTAL ) 25 MG tablet  Take 1 tablet (25 mg total) by mouth 2 (two) times daily. 60 tablet 2   levothyroxine  (SYNTHROID ) 25 MCG tablet Take 1 tablet daily on an empty stomach with  only water for 30 minutes & no Antacid meds, Calcium  or Magnesium  for 4 hours & avoid Biotin. 90 tablet 1   losartan  (COZAAR ) 50 MG tablet Take 1 tablet daily 90 tablet 1   Magnesium  250 MG TABS Take 3 tablets by mouth daily.      meloxicam  (MOBIC ) 15 MG tablet Take  1 tablet Daily with Food for Pain & Inflammation                                            /                      TAKE                   BY                      MOUTH 90 tablet 3   montelukast  (SINGULAIR ) 10 MG tablet Take 1 tablet daily for allergies 90 tablet 3   Multiple Vitamin (MULTI VITAMIN DAILY PO) Take by mouth.     nystatin  (MYCOSTATIN ) 100000 UNIT/ML suspension SWISH AND SWALLOW BY MOUTH FOUR TIMES A DAY FOR THRUSH OR YEAST INFECTION 473 mL 1   pantoprazole  (PROTONIX ) 40 MG tablet TAKE 1 TABLET BY MOUTH DAILY 90 tablet 3   PREMARIN vaginal cream INSERT 1/2 GRAM VAGINALLY VIA APPLICATOR NIGHTLY FOR W EEKS THEN JUST DO TEICE A WEEK  11   rosuvastatin  (CRESTOR ) 40 MG tablet Take  1 tablet  2 x /week  for Cholesterol                                                      /                                 TAKE                  BY                MOUTH 90 tablet 1   Spacer/Aero-Holding Chambers (AEROCHAMBER MV) inhaler Use as instructed with inhaler. 1 each 0   promethazine -dextromethorphan (PROMETHAZINE -DM) 6.25-15 MG/5ML syrup Take 2.5 mLs by mouth 4 (four) times daily as needed for cough. (Patient not taking: Reported on 04/20/2024) 240 mL 0   No facility-administered medications prior to visit.    Allergies  Allergen Reactions   Demerol [Meperidine] Other (See Comments)    REACTION: unknown--itching   Epinephrine  Other (See Comments)    "Sensitivity."   Niacin And Related Rash    Review of Systems  Constitutional:  Negative for chills and fever.  HENT:   Positive for congestion.   Respiratory:  Negative for shortness of breath.   Cardiovascular:  Negative for chest pain, palpitations and leg swelling.  Gastrointestinal:  Negative for abdominal pain, constipation, diarrhea, nausea and vomiting.  Genitourinary:  Negative for dysuria, frequency and urgency.  Neurological:  Negative for dizziness and focal weakness.       Objective:     Physical Exam Constitutional:      General: She is not in acute distress.  Appearance: She is not ill-appearing.  Eyes:     Extraocular Movements: Extraocular movements intact.     Conjunctiva/sclera: Conjunctivae normal.  Cardiovascular:     Rate and Rhythm: Normal rate.  Pulmonary:     Effort: Pulmonary effort is normal.  Musculoskeletal:     Cervical back: Normal range of motion and neck supple.  Skin:    General: Skin is warm and dry.  Neurological:     General: No focal deficit present.     Mental Status: She is alert and oriented to person, place, and time.     Motor: No weakness.     Coordination: Coordination normal.     Gait: Gait normal.  Psychiatric:        Mood and Affect: Mood normal.        Behavior: Behavior normal.        Thought Content: Thought content normal.      BP 126/80 (BP Location: Left Arm, Patient Position: Sitting)   Pulse 67   Temp 97.6 F (36.4 C) (Temporal)   Ht 5\' 1"  (1.549 m)   Wt 100 lb (45.4 kg)   SpO2 98%   BMI 18.89 kg/m  Wt Readings from Last 3 Encounters:  04/20/24 100 lb (45.4 kg)  03/09/24 102 lb (46.3 kg)  03/08/24 102 lb (46.3 kg)       Assessment & Plan:   Problem List Items Addressed This Visit     Essential hypertension   Hyperlipidemia, mixed - Primary   Relevant Orders   Lipid panel   Hypothyroidism   Other Visit Diagnoses       Seasonal allergies         High serum vitamin D          History of prediabetes       Relevant Orders   Hemoglobin A1c      Fasting lipids ordered.  She is taking rosuvastatin  twice  weekly.  She has missed a week or 2. Continue eating a diet low in fat and fried foods. Reviewed labs from 4 weeks ago.  Her vitamin D  was elevated and she has since stopped taking vitamin D .  She will hold off on vitamin D  supplement until our follow-up visit. She has allergies and this time of year her allergies are usually worse.  She is treating her symptoms.  Continue with Flonase , Singulair , nasal saline rinses. Check A1c due to history of prediabetes and gestational diabetes. Follow-up pending results or follow-up for CPE in November and she may have an AWV that day with the nurse as well.  I have discontinued Angela Kell Hoen's promethazine -dextromethorphan. I am also having her maintain her Cholecalciferol (VITAMIN D  PO), Magnesium , CINNAMON PO, Premarin, B-12, fluticasone , AeroChamber MV, nystatin , albuterol , pantoprazole , meloxicam , Fexofenadine-Pseudoephedrine  (ALLEGRA-D PO), Multiple Vitamin (MULTI VITAMIN DAILY PO), montelukast , losartan , amitriptyline , clonazePAM , DULoxetine , lamoTRIgine , levothyroxine , rosuvastatin , and fluticasone -salmeterol.  No orders of the defined types were placed in this encounter.

## 2024-04-23 ENCOUNTER — Ambulatory Visit: Payer: Self-pay | Admitting: Family Medicine

## 2024-05-09 ENCOUNTER — Encounter: Payer: Self-pay | Admitting: Family Medicine

## 2024-05-10 ENCOUNTER — Other Ambulatory Visit: Payer: Self-pay | Admitting: Family

## 2024-05-10 MED ORDER — PREDNISONE 20 MG PO TABS: 20.0000 mg | ORAL_TABLET | Freq: Every day | ORAL | 0 refills | Status: DC

## 2024-05-10 NOTE — Telephone Encounter (Signed)
**Note De-identified  [place] Obfuscation** Please advise 

## 2024-05-25 ENCOUNTER — Encounter: Payer: Self-pay | Admitting: Family Medicine

## 2024-05-25 DIAGNOSIS — J301 Allergic rhinitis due to pollen: Secondary | ICD-10-CM

## 2024-05-25 MED ORDER — FLUTICASONE PROPIONATE 50 MCG/ACT NA SUSP: NASAL | 3 refills | Status: AC

## 2024-06-24 ENCOUNTER — Other Ambulatory Visit (HOSPITAL_COMMUNITY): Payer: Self-pay | Admitting: Psychiatry

## 2024-06-24 DIAGNOSIS — F411 Generalized anxiety disorder: Secondary | ICD-10-CM

## 2024-06-24 DIAGNOSIS — F331 Major depressive disorder, recurrent, moderate: Secondary | ICD-10-CM

## 2024-06-27 ENCOUNTER — Other Ambulatory Visit (HOSPITAL_COMMUNITY): Payer: Self-pay

## 2024-06-27 DIAGNOSIS — F331 Major depressive disorder, recurrent, moderate: Secondary | ICD-10-CM

## 2024-06-27 DIAGNOSIS — F411 Generalized anxiety disorder: Secondary | ICD-10-CM

## 2024-06-27 MED ORDER — DULOXETINE HCL 20 MG PO CPEP: 40.0000 mg | ORAL_CAPSULE | Freq: Every day | ORAL | 0 refills | Status: DC

## 2024-06-30 ENCOUNTER — Other Ambulatory Visit (HOSPITAL_COMMUNITY): Payer: Self-pay | Admitting: Psychiatry

## 2024-06-30 DIAGNOSIS — F411 Generalized anxiety disorder: Secondary | ICD-10-CM

## 2024-06-30 DIAGNOSIS — F331 Major depressive disorder, recurrent, moderate: Secondary | ICD-10-CM

## 2024-07-02 ENCOUNTER — Ambulatory Visit (INDEPENDENT_AMBULATORY_CARE_PROVIDER_SITE_OTHER): Admitting: Family Medicine

## 2024-07-02 VITALS — BP 142/88 | HR 75 | Temp 98.5°F | Ht 61.0 in | Wt 104.2 lb

## 2024-07-02 DIAGNOSIS — B9689 Other specified bacterial agents as the cause of diseases classified elsewhere: Secondary | ICD-10-CM | POA: Insufficient documentation

## 2024-07-02 DIAGNOSIS — J329 Chronic sinusitis, unspecified: Secondary | ICD-10-CM | POA: Diagnosis not present

## 2024-07-02 MED ORDER — FLUCONAZOLE 150 MG PO TABS: 150.0000 mg | ORAL_TABLET | Freq: Once | ORAL | 0 refills | Status: AC

## 2024-07-02 MED ORDER — CEFDINIR 300 MG PO CAPS: 300.0000 mg | ORAL_CAPSULE | Freq: Two times a day (BID) | ORAL | 0 refills | Status: AC

## 2024-07-02 NOTE — Progress Notes (Signed)
 Acute Office Visit  Subjective:     Patient ID: Sarah Rodriguez, female    DOB: [birth year], 68 y.o.   MRN: [id number]  Chief Complaint  Patient presents with   Acute Visit    Ongoing since the end of July, nasal congestion, post nasal drip, having ear and throat aches    HPI  Discussed the use of AI scribe software for clinical note transcription with the patient, who gave verbal consent to proceed.  History of Present Illness Sarah Rodriguez is a 68 year old female who presents with congestion, earache, and fatigue.  Upper respiratory symptoms - Congestion present since Friday - Severe right earache, more pronounced on one side, affecting hearing - Wears hearing aids - Occasional coughing - Unilateral throat soreness - No significant coughing  Fatigue and constitutional symptoms - Fatigue, weakness, and shakiness present since Friday - Lack of energy  Recurrent sinus infections and medication use - History of recurrent sinus infections - Previously treated with Z-Pak and another recent prescription (unspecified) - Occasional use of Robitussin DM and inhaler - Daily Nystatin  to prevent yeast infections from antibiotics  Gastrointestinal symptoms - Chronic constipation is a persistent issue     ROS Per HPI      Objective:    BP (!) 142/88 (BP Location: Left Arm, Patient Position: Sitting)   Pulse 75   Temp 98.5 F (36.9 C) (Temporal)   Ht 5' 1 (1.549 m)   Wt 104 lb 3.2 oz (47.3 kg)   SpO2 95%   BMI 19.69 kg/m    Physical Exam Vitals and nursing note reviewed.  Constitutional:      General: She is not in acute distress.    Appearance: She is normal weight.     Comments: Appears fatigued   HENT:     Head: Normocephalic and atraumatic.     Right Ear: External ear normal. A middle ear effusion is present. Tympanic membrane is not erythematous or bulging.     Left Ear: External ear normal.  No middle ear effusion. Tympanic membrane is not  erythematous or bulging.     Nose:     Right Sinus: Maxillary sinus tenderness present. No frontal sinus tenderness.     Left Sinus: No maxillary sinus tenderness or frontal sinus tenderness.     Mouth/Throat:     Mouth: Mucous membranes are moist.     Pharynx: Oropharynx is clear.  Eyes:     Extraocular Movements: Extraocular movements intact.     Pupils: Pupils are equal, round, and reactive to light.  Cardiovascular:     Rate and Rhythm: Normal rate and regular rhythm.     Pulses: Normal pulses.     Heart sounds: Normal heart sounds.  Pulmonary:     Effort: Pulmonary effort is normal. No respiratory distress.     Breath sounds: Normal breath sounds. No wheezing, rhonchi or rales.  Musculoskeletal:        General: Normal range of motion.     Cervical back: Normal range of motion.     Right lower leg: No edema.     Left lower leg: No edema.  Lymphadenopathy:     Cervical: No cervical adenopathy.  Neurological:     General: No focal deficit present.     Mental Status: She is alert and oriented to person, place, and time.  Psychiatric:        Mood and Affect: Mood normal.        Thought  Content: Thought content normal.     No results found for any visits on 07/02/24.      Assessment & Plan:   Assessment and Plan Assessment & Plan Acute sinusitis with otalgia Acute sinusitis with ear pain, congestion, and facial tenderness. Likely bacterial due to recurrent nature and symptom severity. - Prescribe cefdinir  for sinusitis. - Advise use of over-the-counter vaginal antifungal treatment for yeast infection prevention. - Prescribe Diflucan  (fluconazole ) for yeast infection prevention.  Candidal vulvovaginitis secondary to antibiotics Recurrent candidal vulvovaginitis associated with antibiotic use, prefers Diflucan  for ease and faster relief. - Prescribe Diflucan  (fluconazole ) for yeast infection prevention. - Advise use of over-the-counter vaginal antifungal treatment as an  alternative.  Chronic constipation Chronic constipation potentially exacerbated by antibiotic use.     No orders of the defined types were placed in this encounter.    Meds ordered this encounter  Medications   fluconazole  (DIFLUCAN ) 150 MG tablet    Sig: Take 1 tablet (150 mg total) by mouth once for 1 dose.    Dispense:  1 tablet    Refill:  0   cefdinir  (OMNICEF ) 300 MG capsule    Sig: Take 1 capsule (300 mg total) by mouth 2 (two) times daily for 7 days.    Dispense:  14 capsule    Refill:  0    Please follow up with me if symptoms worsen  Corean LITTIE Ku, FNP

## 2024-07-02 NOTE — Patient Instructions (Signed)
 I have sent in an antibiotic for you to take 1 tablet by mouth twice a day for 7 days.  Please eat with this medication, it can upset your stomach if you do not.  Take all of the medication even if you are feeling better.  I have also sent in Diflucan  for you to take in case of yeast after antibiotic treatment.  If you are having symptoms when you complete antibiotics, may take 1 tablet.    Follow-up with me for new or worsening symptoms.

## 2024-07-03 ENCOUNTER — Encounter: Payer: Self-pay | Admitting: Family Medicine

## 2024-07-03 ENCOUNTER — Other Ambulatory Visit: Payer: Self-pay | Admitting: Family Medicine

## 2024-07-03 MED ORDER — PREDNISONE 20 MG PO TABS
40.0000 mg | ORAL_TABLET | Freq: Every day | ORAL | 0 refills | Status: AC
Start: 2024-07-03 — End: 2024-07-08

## 2024-07-04 ENCOUNTER — Other Ambulatory Visit (HOSPITAL_COMMUNITY): Payer: Self-pay | Admitting: Psychiatry

## 2024-07-04 DIAGNOSIS — F331 Major depressive disorder, recurrent, moderate: Secondary | ICD-10-CM

## 2024-07-05 ENCOUNTER — Telehealth (HOSPITAL_BASED_OUTPATIENT_CLINIC_OR_DEPARTMENT_OTHER): Admitting: Psychiatry

## 2024-07-05 ENCOUNTER — Encounter (HOSPITAL_COMMUNITY): Payer: Self-pay | Admitting: Psychiatry

## 2024-07-05 DIAGNOSIS — F411 Generalized anxiety disorder: Secondary | ICD-10-CM | POA: Diagnosis not present

## 2024-07-05 DIAGNOSIS — F331 Major depressive disorder, recurrent, moderate: Secondary | ICD-10-CM | POA: Diagnosis not present

## 2024-07-05 MED ORDER — AMITRIPTYLINE HCL 25 MG PO TABS: 25.0000 mg | ORAL_TABLET | Freq: Every day | ORAL | 0 refills | Status: DC

## 2024-07-05 MED ORDER — LAMOTRIGINE 25 MG PO TABS: 25.0000 mg | ORAL_TABLET | Freq: Two times a day (BID) | ORAL | 2 refills | Status: DC

## 2024-07-05 MED ORDER — CLONAZEPAM 0.5 MG PO TABS: ORAL_TABLET | ORAL | 1 refills | Status: DC

## 2024-07-05 MED ORDER — DULOXETINE HCL 20 MG PO CPEP: 40.0000 mg | ORAL_CAPSULE | Freq: Every day | ORAL | 2 refills | Status: DC

## 2024-07-05 NOTE — Progress Notes (Signed)
 [facility] MD Virtual Progress Note   Patient Location: Home Provider Location: Office  I connect with patient by video and verified that I am speaking with correct person by using two identifiers. I discussed the limitations of evaluation and management by telemedicine and the availability of in person appointments. I also discussed with the patient that there may be a patient responsible charge related to this service. The patient expressed understanding and agreed to proceed.  Sarah Rodriguez [id number] 68 y.o.  07/05/2024 2:17 PM  History of Present Illness:  Patient is evaluated by video session.  She is not feeling very well and have sinus pressure in her head.  For the same reason she is not sleeping well but overall she described mood is better and denies any major panic attack or any crying spells.  She reported her energy level was much better until she feels sickness for past 2 days.  She has a visit with a primary care and labs were drawn.  Hemoglobin A1c normal.  Vitamin D  level very high but she is no longer taking the vitamin D .  She denies any crying spells or any feeling of hopelessness or worthlessness.  Her energy level is low today but otherwise she does walk every day.  She is taking all her medication as prescribed.  She has no tremors, shakes or any EPS.  She try to keep herself busy.  She is still taking one fourth of Klonopin  when she feels very nervous or anxious and has taken few times since the last visit.  She has no rash or any itching.  She is compliant with amitriptyline , Cymbalta  and Lamictal .  She denies drinking or using any illegal substances.  She reported husband is very cooperative and supportive.  Past Psychiatric History:  /O depression and taking antidepressants since 1996.  H/O panic attacks while driving.  Saw Dr. Elodie Anon and tried Prozac, Paxil, Zoloft, Xanax, Cymbalta  and Klonopin .  No h/o inpatient treatment or any suicidal  attempt.   Past Medical History:  Diagnosis Date   Asthma    Dulera  daily   Chronic back pain    HNP   Complication of anesthesia    slow to wake up   Depression    takes Clonazepam  nightly   GERD (gastroesophageal reflux disease)    takes Protonix  daily    Heart murmur    History of migraine    takes Relpax  daily as needed   HSV (herpes simplex virus) infection    Hyperlipidemia    takes Atorvastatin  daily   Hypertension    takes Benicar  daily   Hypothyroidism    takes Synthroid  daily   MVP (mitral valve prolapse)    Pneumonia    hx of--been many yrs ago   Seasonal allergies    takes Allegra daily   Shortness of breath    with exertion    Outpatient Encounter Medications as of 07/05/2024  Medication Sig   albuterol  (VENTOLIN  HFA) 108 (90 Base) MCG/ACT inhaler Inhale 1-2 puffs into the lungs every 6 (six) hours as needed.   amitriptyline  (ELAVIL ) 25 MG tablet Take 1 tablet (25 mg total) by mouth at bedtime.   cefdinir  (OMNICEF ) 300 MG capsule Take 1 capsule (300 mg total) by mouth 2 (two) times daily for 7 days.   CINNAMON PO Take 1,000 Units by mouth 2 (two) times daily.    clonazePAM  (KLONOPIN ) 0.5 MG tablet Take 1/2 to 1 tablet as needed for severe anxiety.  Cyanocobalamin  (B-12) 2500 MCG TABS Take by mouth.   DULoxetine  (CYMBALTA ) 20 MG capsule Take 2 capsules (40 mg total) by mouth daily.   Fexofenadine-Pseudoephedrine  (ALLEGRA-D PO) Take by mouth.   fluticasone  (FLONASE ) 50 MCG/ACT nasal spray SHAKE LIQUID AND USE 2 SPRAYS IN EACH NOSTRIL EVERY EVENING   fluticasone -salmeterol (ADVAIR  HFA) 115-21 MCG/ACT inhaler Use  2 inhalations  15 minutes apart  2 x /day (every 12 hours)   lamoTRIgine  (LAMICTAL ) 25 MG tablet Take 1 tablet (25 mg total) by mouth 2 (two) times daily.   levothyroxine  (SYNTHROID ) 25 MCG tablet Take 1 tablet daily on an empty stomach with only water for 30 minutes & no Antacid meds, Calcium  or Magnesium  for 4 hours & avoid Biotin.   losartan   (COZAAR ) 50 MG tablet Take 1 tablet daily   Magnesium  250 MG TABS Take 3 tablets by mouth daily.    meloxicam  (MOBIC ) 15 MG tablet Take  1 tablet Daily with Food for Pain & Inflammation                                            /                      TAKE                   BY                      MOUTH   montelukast  (SINGULAIR ) 10 MG tablet Take 1 tablet daily for allergies   Multiple Vitamin (MULTI VITAMIN DAILY PO) Take by mouth.   nystatin  (MYCOSTATIN ) 100000 UNIT/ML suspension SWISH AND SWALLOW BY MOUTH FOUR TIMES A DAY FOR THRUSH OR YEAST INFECTION   pantoprazole  (PROTONIX ) 40 MG tablet TAKE 1 TABLET BY MOUTH DAILY   predniSONE  (DELTASONE ) 20 MG tablet Take 2 tablets (40 mg total) by mouth daily for 5 days.   PREMARIN vaginal cream INSERT 1/2 GRAM VAGINALLY VIA APPLICATOR NIGHTLY FOR W EEKS THEN JUST DO TEICE A WEEK   rosuvastatin  (CRESTOR ) 40 MG tablet Take  1 tablet  2 x /week  for Cholesterol                                                      /                                 TAKE                  BY                MOUTH   Spacer/Aero-Holding Chambers (AEROCHAMBER MV) inhaler Use as instructed with inhaler.   No facility-administered encounter medications on file as of 07/05/2024.    Recent Results (from the past 2160 hours)  [facility] MAMMOGRAPHY     Status: None   Collection Time: 04/09/24  8:40 AM  Result Value Ref Range   [facility] Mammogram 0-4 Bi-Rad 0-4 Bi-Rad, Self Reported Normal    Comment: Abstracted by HIM  Lipid panel     Status: None   Collection  Time: 04/20/24  1:47 PM  Result Value Ref Range   Cholesterol 149 0 - 200 mg/dL    Comment: ATP III Classification       Desirable:  < 200 mg/dL               Borderline High:  200 - 239 mg/dL          High:  > = 759 mg/dL   Triglycerides 20.9 0.0 - 149.0 mg/dL    Comment: Normal:  <849 mg/dLBorderline High:  150 - 199 mg/dL   HDL 49.69 >60.99 mg/dL   VLDL 84.1 0.0 - 59.9 mg/dL   LDL Cholesterol 83 0 - 99 mg/dL   Total CHOL/HDL  Ratio 3     Comment:                Men          Women1/2 Average Risk     3.4          3.3Average Risk          5.0          4.42X Average Risk          9.6          7.13X Average Risk          15.0          11.0                       NonHDL 99.05     Comment: NOTE:  Non-HDL goal should be 30 mg/dL higher than patient's LDL goal (i.e. LDL goal of < 70 mg/dL, would have non-HDL goal of < 100 mg/dL)  Hemoglobin J8r     Status: None   Collection Time: 04/20/24  1:47 PM  Result Value Ref Range   Hgb A1c MFr Bld 5.8 4.6 - 6.5 %    Comment: Glycemic Control Guidelines for People with Diabetes:Non Diabetic:  <6%Goal of Therapy: <7%Additional Action Suggested:  >8%      Psychiatric Specialty Exam: Physical Exam  Review of Systems  HENT:  Positive for sinus pressure.     Weight 104 lb (47.2 kg).There is no height or weight on file to calculate BMI.  General Appearance: Casual  Eye Contact:  Good  Speech:  Clear and Coherent  Volume:  Normal  Mood:  Euthymic  Affect:  Appropriate  Thought Process:  Goal Directed  Orientation:  Full (Time, Place, and Person)  Thought Content:  WDL  Suicidal Thoughts:  No  Homicidal Thoughts:  No  Memory:  Immediate;   Good Recent;   Good Remote;   Good  Judgement:  Good  Insight:  Good  Psychomotor Activity:  Normal  Concentration:  Concentration: Good and Attention Span: Good  Recall:  Good  Fund of Knowledge:  Good  Language:  Good  Akathisia:  No  Handed:  Right  AIMS (if indicated):     Assets:  Communication Skills Desire for Improvement Housing Transportation  ADL's:  Intact  Cognition:  WNL  Sleep:  ok       07/02/2024    3:59 PM 03/09/2024    1:14 PM 07/13/2023    3:16 PM 01/06/2023   11:48 AM 09/30/2022   12:33 AM  Depression screen PHQ 2/9  Decreased Interest 0 0 0 1 0  Down, Depressed, Hopeless 0 1 1  0  PHQ - 2 Score 0 1 1 1  0  Altered  sleeping 0 2     Tired, decreased energy 0 3     Change in appetite 0 2     Feeling bad or  failure about yourself  0 2     Trouble concentrating 0 0     Moving slowly or fidgety/restless 0 0     Suicidal thoughts 0 0     PHQ-9 Score 0 10     Difficult doing work/chores Not difficult at all        Assessment/Plan: GAD (generalized anxiety disorder) - Plan: amitriptyline  (ELAVIL ) 25 MG tablet, clonazePAM  (KLONOPIN ) 0.5 MG tablet, DULoxetine  (CYMBALTA ) 20 MG capsule  MDD (major depressive disorder), recurrent episode, moderate (HCC) - Plan: amitriptyline  (ELAVIL ) 25 MG tablet, clonazePAM  (KLONOPIN ) 0.5 MG tablet, DULoxetine  (CYMBALTA ) 20 MG capsule, lamoTRIgine  (LAMICTAL ) 25 MG tablet  Patient is 68 year old Caucasian married female with history of hypertension, migraine, sleep apnea, GERD, hypothyroidism, hearing impairment, generalized anxiety disorder, major depressive disorder.  Review blood work results and collateral information.  Currently stable on medicine.  Reported sinus headaches and pressure but otherwise mood stable.  No major concern from the medication and like to keep the current psychotropic medication.  Continue amitriptyline  25 mg at bedtime, Klonopin  one fourth of 0.5 mg tablet to take as needed for severe anxiety, Cymbalta  20 mg 2 times a day and Lamictal  25 mg twice a day.  Encouraged to call back if she has any question or any concern.  Patient does not feel she needed therapy at this time.  Follow-up in 3 months   Follow Up Instructions:     I discussed the assessment and treatment plan with the patient. The patient was provided an opportunity to ask questions and all were answered. The patient agreed with the plan and demonstrated an understanding of the instructions.   The patient was advised to call back or seek an in-person evaluation if the symptoms worsen or if the condition fails to improve as anticipated.    Collaboration of Care: Other provider involved in patient's care AEB notes are available in epic to review  Patient/Guardian was advised  Release of Information must be obtained prior to any record release in order to collaborate their care with an outside provider. Patient/Guardian was advised if they have not already done so to contact the registration department to sign all necessary forms in order for us  to release information regarding their care.   Consent: Patient/Guardian gives verbal consent for treatment and assignment of benefits for services provided during this visit. Patient/Guardian expressed understanding and agreed to proceed.     Total encounter time 12 minutes which includes face-to-face time, chart reviewed, care coordination, order entry and documentation during this encounter.   Note: This document was prepared by [employer] voice dictation technology and any errors that results from this process are unintentional.    Leni ONEIDA Client, MD 07/05/2024

## 2024-07-17 ENCOUNTER — Ambulatory Visit (INDEPENDENT_AMBULATORY_CARE_PROVIDER_SITE_OTHER)

## 2024-07-17 VITALS — BP 122/70 | HR 97 | Ht 61.0 in | Wt 103.2 lb

## 2024-07-17 DIAGNOSIS — Z0000 Encounter for general adult medical examination without abnormal findings: Secondary | ICD-10-CM | POA: Diagnosis not present

## 2024-07-17 DIAGNOSIS — Z780 Asymptomatic menopausal state: Secondary | ICD-10-CM

## 2024-07-17 NOTE — Progress Notes (Signed)
 Subjective:   Sarah Rodriguez is a 68 y.o. who presents for a Medicare Wellness preventive visit.  As a reminder, Annual Wellness Visits don't include a physical exam, and some assessments may be limited, especially if this visit is performed virtually. We may recommend an in-person follow-up visit with your provider if needed.  Visit Complete: In person  Persons Participating in Visit: Patient.  AWV Questionnaire: Yes: Patient Medicare AWV questionnaire was completed by the patient on 07/13/2024; I have confirmed that all information answered by patient is correct and no changes since this date.  Cardiac Risk Factors include: advanced age (>54men, >85 women);hypertension;dyslipidemia     Objective:    Today's Vitals   07/17/24 1133  BP: 122/70  Pulse: 97  SpO2: 98%  Weight: 103 lb 3.2 oz (46.8 kg)  Height: 5' 1 (1.549 m)   Body mass index is 19.5 kg/m.     07/17/2024   11:32 AM 07/13/2023    3:16 PM 08/12/2022   11:51 AM 01/06/2022   11:29 AM 01/06/2021   12:21 PM 12/05/2019   11:59 AM 05/31/2019   11:54 AM  Advanced Directives  Does Patient Have a Medical Advance Directive? Yes No Yes Yes Yes Yes Yes  Type of Estate agent of [place];Living will   Healthcare Power of [place];Living will Healthcare Power of [place];Living will Healthcare Power of [place];Living will Healthcare Power of [place];Living will  Does patient want to make changes to medical advance directive?   No - Patient declined No - Patient declined No - Patient declined No - Patient declined   Copy of Healthcare Power of Attorney in Chart? No - copy requested   No - copy requested No - copy requested No - copy requested No - copy requested      Data saved with a previous flowsheet row definition    Current Medications (verified) Outpatient Encounter Medications as of 07/17/2024  Medication Sig   albuterol  (VENTOLIN  HFA) 108 (90 Base) MCG/ACT inhaler Inhale 1-2 puffs into the lungs  every 6 (six) hours as needed.   amitriptyline  (ELAVIL ) 25 MG tablet Take 1 tablet (25 mg total) by mouth at bedtime.   CINNAMON PO Take 1,000 Units by mouth 2 (two) times daily.    clonazePAM  (KLONOPIN ) 0.5 MG tablet Take 1/2 to 1 tablet as needed for severe anxiety.   Cyanocobalamin  (B-12) 2500 MCG TABS Take by mouth.   DULoxetine  (CYMBALTA ) 20 MG capsule Take 2 capsules (40 mg total) by mouth daily.   Fexofenadine-Pseudoephedrine  (ALLEGRA-D PO) Take by mouth.   fluticasone  (FLONASE ) 50 MCG/ACT nasal spray SHAKE LIQUID AND USE 2 SPRAYS IN EACH NOSTRIL EVERY EVENING   fluticasone -salmeterol (ADVAIR  HFA) 115-21 MCG/ACT inhaler Use  2 inhalations  15 minutes apart  2 x /day (every 12 hours)   lamoTRIgine  (LAMICTAL ) 25 MG tablet Take 1 tablet (25 mg total) by mouth 2 (two) times daily.   levothyroxine  (SYNTHROID ) 25 MCG tablet Take 1 tablet daily on an empty stomach with only water for 30 minutes & no Antacid meds, Calcium  or Magnesium  for 4 hours & avoid Biotin.   losartan  (COZAAR ) 50 MG tablet Take 1 tablet daily   Magnesium  250 MG TABS Take 3 tablets by mouth daily.    meloxicam  (MOBIC ) 15 MG tablet Take  1 tablet Daily with Food for Pain & Inflammation                                            /  TAKE                   BY                      MOUTH   montelukast  (SINGULAIR ) 10 MG tablet Take 1 tablet daily for allergies   Multiple Vitamin (MULTI VITAMIN DAILY PO) Take by mouth.   nystatin  (MYCOSTATIN ) 100000 UNIT/ML suspension SWISH AND SWALLOW BY MOUTH FOUR TIMES A DAY FOR THRUSH OR YEAST INFECTION   pantoprazole  (PROTONIX ) 40 MG tablet TAKE 1 TABLET BY MOUTH DAILY   PREMARIN vaginal cream INSERT 1/2 GRAM VAGINALLY VIA APPLICATOR NIGHTLY FOR W EEKS THEN JUST DO TEICE A WEEK   rosuvastatin  (CRESTOR ) 40 MG tablet Take  1 tablet  2 x /week  for Cholesterol                                                      /                                 TAKE                  BY                 MOUTH   Spacer/Aero-Holding Chambers (AEROCHAMBER MV) inhaler Use as instructed with inhaler.   tolterodine (DETROL LA) 2 MG 24 hr capsule Take 2 mg by mouth daily.   No facility-administered encounter medications on file as of 07/17/2024.    Allergies (verified) Demerol [meperidine], Epinephrine , and Niacin and related   History: Past Medical History:  Diagnosis Date   Asthma    Dulera  daily   Chronic back pain    HNP   Complication of anesthesia    slow to wake up   Depression    takes Clonazepam  nightly   GERD (gastroesophageal reflux disease)    takes Protonix  daily    Heart murmur    History of migraine    takes Relpax  daily as needed   HSV (herpes simplex virus) infection    Hyperlipidemia    takes Atorvastatin  daily   Hypertension    takes Benicar  daily   Hypothyroidism    takes Synthroid  daily   MVP (mitral valve prolapse)    Pneumonia    hx of--been many yrs ago   Seasonal allergies    takes Allegra daily   Shortness of breath    with exertion   Past Surgical History:  Procedure Laterality Date   ABLATION  2011   Uterine   buttocks surgery     at age 77 (coccyx repair)   CERVICAL FUSION  03/2010   CESAREAN SECTION  1992   CHOLECYSTECTOMY  2008   COLONOSCOPY     DILATION AND CURETTAGE OF UTERUS  2011   ESOPHAGOGASTRODUODENOSCOPY     LUMBAR LAMINECTOMY/DECOMPRESSION MICRODISCECTOMY  10/24/2012   Procedure: LUMBAR LAMINECTOMY/DECOMPRESSION MICRODISCECTOMY 1 LEVEL;  Surgeon: Victory Gens, MD;  Location: [facility];  Service: Neurosurgery;  Laterality: Right;  Right Lumbar five-Sacral One Microdiskectomy   right knee arthroscopy     x 3   TONSILLECTOMY     at age 20   Family History  Problem Relation Age of Onset  Arthritis Mother    Heart attack Mother    Depression Mother    Allergies Mother    Hypertension Mother    Heart attack Father    Social History   Socioeconomic History   Marital status: Married    Spouse name: Not on  file   Number of children: Not on file   Years of education: Not on file   Highest education level: Bachelor's degree (e.g., BA, AB, BS)  Occupational History   Not on file  Tobacco Use   Smoking status: Former   Smokeless tobacco: Never   Tobacco comments:    quit at age 17  Vaping Use   Vaping status: Never Used  Substance and Sexual Activity   Alcohol use: No    Comment: 2-3 times a yr   Drug use: No   Sexual activity: Yes    Birth control/protection: Post-menopausal  Other Topics Concern   Not on file  Social History Narrative   Not on file   Social Drivers of Health   Financial Resource Strain: Low Risk  (07/17/2024)   Overall Financial Resource Strain (CARDIA)    Difficulty of Paying Living Expenses: Not hard at all  Food Insecurity: No Food Insecurity (07/17/2024)   Hunger Vital Sign    Worried About Running Out of Food in the Last Year: Never true    Ran Out of Food in the Last Year: Never true  Transportation Needs: No Transportation Needs (07/17/2024)   PRAPARE - Administrator, Civil Service (Medical): No    Lack of Transportation (Non-Medical): No  Physical Activity: Insufficiently Active (07/17/2024)   Exercise Vital Sign    Days of Exercise per Week: 3 days    Minutes of Exercise per Session: 30 min  Stress: No Stress Concern Present (07/17/2024)   [employer] of Occupational Health - Occupational Stress Questionnaire    Feeling of Stress: Only a little  Social Connections: Moderately Integrated (07/17/2024)   Social Connection and Isolation Panel    Frequency of Communication with Friends and Family: Three times a week    Frequency of Social Gatherings with Friends and Family: Once a week    Attends Religious Services: More than 4 times per year    Active Member of [employer] or Organizations: No    Attends Engineer, structural: Never    Marital Status: Married    Tobacco Counseling Counseling given: No Tobacco comments: quit at  age 68    Clinical Intake:  Pre-visit preparation completed: Yes  Pain : No/denies pain     BMI - recorded: 19.5 Nutritional Status: BMI <19  Underweight Nutritional Risks: None Diabetes: No  Lab Results  Component Value Date   HGBA1C 5.8 04/20/2024   HGBA1C 5.5 10/13/2023   HGBA1C 5.6 07/13/2023     How often do you need to have someone help you when you read instructions, pamphlets, or other written materials from your doctor or pharmacy?: 1 - Never  Interpreter Needed?: No  Information entered by :: Verdie Saba, CMA   Activities of Daily Living     07/17/2024   11:28 AM 07/13/2024   12:43 PM  In your present state of health, do you have any difficulty performing the following activities:  Hearing? 1 1  Comment wears hearing aids and following up with ENT   Vision? 0 0  Difficulty concentrating or making decisions? 1 1  Walking or climbing stairs? 1 1  Dressing or bathing? 0 0  Doing errands, shopping? 0 0  Preparing Food and eating ? N N  Using the Toilet? N N  In the past six months, have you accidently leaked urine? N N  Do you have problems with loss of bowel control? N N  Managing your Medications? N N  Managing your Finances? N N  Housekeeping or managing your Housekeeping? CINDERELLA CINDERELLA    Patient Care Team: Lendia Boby CROME, NP-C as PCP - General (Family Medicine) Colon Shove, MD as Consulting Physician (Neurosurgery) Curry Leni DASEN, MD as Consulting Physician (Psychiatry) [place], Heather , OD (Optometry)  I have updated your Care Teams any recent Medical Services you may have received from other providers in the past year.     Assessment:   This is a routine wellness examination for Sarah Rodriguez.  Hearing/Vision screen Hearing Screening - Comments:: Denies hearing difficulties   Vision Screening - Comments:: Wears rx glasses - up to date with routine eye exams with Heather  [place]   Goals Addressed               This Visit's Progress     Patient  Stated (pt-stated)        Patient stated she plans to continue to manage her blood pressure and meds       Depression Screen     07/17/2024   11:41 AM 07/02/2024    3:59 PM 03/09/2024    1:14 PM 07/13/2023    3:16 PM 01/06/2023   11:48 AM 09/30/2022   12:33 AM 05/18/2022    1:37 PM  PHQ 2/9 Scores  PHQ - 2 Score 0 0 1 1 1  0   PHQ- 9 Score 1 0 10         Information is confidential and restricted. Go to Review Flowsheets to unlock data.    Fall Risk     07/17/2024   11:29 AM 07/13/2024   12:43 PM 07/02/2024    3:59 PM 03/09/2024    1:15 PM 07/13/2023    3:16 PM  Fall Risk   Falls in the past year? 0 0 0 1 0  Number falls in past yr: 0 0 0 0   Injury with Fall? 0 0 0 0   Risk for fall due to : No Fall Risks   No Fall Risks   Follow up Falls evaluation completed;Falls prevention discussed   Falls evaluation completed     MEDICARE RISK AT HOME:  Medicare Risk at Home Any stairs in or around the home?: Yes If so, are there any without handrails?: Yes Home free of loose throw rugs in walkways, pet beds, electrical cords, etc?: Yes Adequate lighting in your home to reduce risk of falls?: Yes Life alert?: No Use of a cane, walker or w/c?: No Grab bars in the bathroom?: Yes Shower chair or bench in shower?: No Elevated toilet seat or a handicapped toilet?: No  TIMED UP AND GO:  Was the test performed?  No  Cognitive Function: 6CIT completed        07/17/2024   11:42 AM  6CIT Screen  What Year? 0 points  What month? 0 points  What time? 0 points  Count back from 20 0 points  Months in reverse 0 points  Repeat phrase 0 points  Total Score 0 points    Immunizations Immunization History  Administered Date(s) Administered   Influenza Inj Mdck Quad Pf 08/27/2019   Influenza Inj Mdck Quad With Preservative 09/14/2017, 10/19/2018, 09/24/2020   Influenza, High Dose  Seasonal PF 09/08/2022   Influenza,inj,Quad PF,6+ Mos 10/06/2021   Influenza,inj,quad, With Preservative  11/09/2016   Influenza-Unspecified 09/18/2013   Moderna Sars-Covid-2 Vaccination 02/15/2020, 03/18/2020, 11/10/2020   PNEUMOCOCCAL CONJUGATE-20 09/08/2022   PPD Test 04/17/2014   Pneumococcal-Unspecified 11/30/1995   Td 08/27/2005, 05/05/2016    Screening Tests Health Maintenance  Topic Date Due   Zoster Vaccines- Shingrix (1 of 2) Never done   DEXA SCAN  Never done   COVID-19 Vaccine (4 - 2024-25 season) 07/31/2023   INFLUENZA VACCINE  06/29/2024   MAMMOGRAM  04/09/2025   Medicare Annual Wellness (AWV)  07/17/2025   DTaP/Tdap/Td (3 - Tdap) 05/05/2026   Colonoscopy  02/26/2031   Pneumococcal Vaccine: 50+ Years  Completed   Hepatitis C Screening  Completed   HPV VACCINES  Aged Out   Meningococcal B Vaccine  Aged Out   Pneumococcal Vaccine  Discontinued    Health Maintenance  Health Maintenance Due  Topic Date Due   Zoster Vaccines- Shingrix (1 of 2) Never done   DEXA SCAN  Never done   COVID-19 Vaccine (4 - 2024-25 season) 07/31/2023   INFLUENZA VACCINE  06/29/2024   Health Maintenance Items Addressed:  DEXA ordered today  Additional Screening:  Vision Screening: Recommended annual ophthalmology exams for early detection of glaucoma and other disorders of the eye. Would you like a referral to an eye doctor? No    Dental Screening: Recommended annual dental exams for proper oral hygiene  Community Resource Referral / Chronic Care Management: CRR required this visit?  No   CCM required this visit?  No   Plan:    I have personally reviewed and noted the following in the patient's chart:   Medical and social history Use of alcohol, tobacco or illicit drugs  Current medications and supplements including opioid prescriptions. Patient is not currently taking opioid prescriptions. Functional ability and status Nutritional status Physical activity Advanced directives List of other physicians Hospitalizations, surgeries, and ER visits in previous 12  months Vitals Screenings to include cognitive, depression, and falls Referrals and appointments  In addition, I have reviewed and discussed with patient certain preventive protocols, quality metrics, and best practice recommendations. A written personalized care plan for preventive services as well as general preventive health recommendations were provided to patient.   Verdie CHRISTELLA Saba, CMA   07/17/2024   After Visit Summary: (In Person-Declined) Patient declined AVS at this time.  Notes: Appt w/PCP on 10/17/2024 for a Physical.

## 2024-07-17 NOTE — Patient Instructions (Addendum)
 Sarah Rodriguez , Thank you for taking time out of your busy schedule to complete your Annual Wellness Visit with me. I enjoyed our conversation and look forward to speaking with you again next year. I, as well as your care team,  appreciate your ongoing commitment to your health goals. Please review the following plan we discussed and let me know if I can assist you in the future. Your Game plan/ To Do List    Referrals: If you haven't heard from the office you've been referred to, please reach out to them at the phone provided. [place]red[place] a DEXA scan.  Follow up Visits: We will see or speak with you next year for your Next Medicare AWV with our clinical staff Have you seen your provider in the last 6 months (3 months if uncontrolled diabetes)? No  Clinician Recommendations:  Aim for 30 minutes of exercise or brisk walking, 6-8 glasses of water, and 5 servings of fruits and vegetables each day. Educated and advised on getting the Shingles vaccines in 2025.      This is a list of the screenings recommended for you:  Health Maintenance  Topic Date Due   Zoster (Shingles) Vaccine (1 of 2) Never done   DEXA scan (bone density measurement)  Never done   COVID-19 Vaccine (4 - 2024-25 season) 07/31/2023   Flu Shot  06/29/2024   Mammogram  04/09/2025   Medicare Annual Wellness Visit  07/17/2025   DTaP/Tdap/Td vaccine (3 - Tdap) 05/05/2026   Colon Cancer Screening  02/26/2031   Pneumococcal Vaccine for age over 53  Completed   Hepatitis C Screening  Completed   HPV Vaccine  Aged Out   Meningitis B Vaccine  Aged Out   Pneumococcal Vaccine  Discontinued    Advanced directives: (Copy Requested) Please bring a copy of your health care power of attorney and living will to the office to be added to your chart at your convenience. You can mail to [facility] [address] [place], [place] [address] or email to [email or web address] Advance Care Planning is important because  it:  [x]  Makes sure you receive the medical care that is consistent with your values, goals, and preferences  [x]  It provides guidance to your family and loved ones and reduces their decisional burden about whether or not they are making the right decisions based on your wishes.  Follow the link provided in your after visit summary or read over the paperwork we have mailed to you to help you started getting your Advance Directives in place. If you need assistance in completing these, please reach out to us  so that we can help you!

## 2024-08-30 ENCOUNTER — Other Ambulatory Visit: Payer: Self-pay | Admitting: Family Medicine

## 2024-08-30 DIAGNOSIS — I10 Essential (primary) hypertension: Secondary | ICD-10-CM

## 2024-09-10 ENCOUNTER — Encounter: Payer: Self-pay | Admitting: Family Medicine

## 2024-09-10 DIAGNOSIS — B3789 Other sites of candidiasis: Secondary | ICD-10-CM

## 2024-09-11 MED ORDER — NYSTATIN 100000 UNIT/ML MT SUSP: OROMUCOSAL | 0 refills | Status: AC

## 2024-09-11 NOTE — Addendum Note (Signed)
 Addended by: Dontario Evetts E on: 09/11/2024 09:29 AM   Modules accepted: Orders

## 2024-09-11 NOTE — Telephone Encounter (Signed)
Ok to refill this? 

## 2024-09-23 ENCOUNTER — Other Ambulatory Visit: Payer: Self-pay | Admitting: Family Medicine

## 2024-09-23 DIAGNOSIS — E039 Hypothyroidism, unspecified: Secondary | ICD-10-CM

## 2024-09-29 ENCOUNTER — Other Ambulatory Visit (HOSPITAL_COMMUNITY): Payer: Self-pay | Admitting: Psychiatry

## 2024-09-29 DIAGNOSIS — F411 Generalized anxiety disorder: Secondary | ICD-10-CM

## 2024-09-29 DIAGNOSIS — F331 Major depressive disorder, recurrent, moderate: Secondary | ICD-10-CM

## 2024-10-02 ENCOUNTER — Other Ambulatory Visit (HOSPITAL_COMMUNITY): Payer: Self-pay

## 2024-10-02 DIAGNOSIS — F331 Major depressive disorder, recurrent, moderate: Secondary | ICD-10-CM

## 2024-10-02 DIAGNOSIS — F411 Generalized anxiety disorder: Secondary | ICD-10-CM

## 2024-10-02 MED ORDER — DULOXETINE HCL 20 MG PO CPEP: 40.0000 mg | ORAL_CAPSULE | Freq: Every day | ORAL | 0 refills | Status: DC

## 2024-10-02 MED ORDER — LAMOTRIGINE 25 MG PO TABS: 25.0000 mg | ORAL_TABLET | Freq: Two times a day (BID) | ORAL | 0 refills | Status: DC

## 2024-10-14 ENCOUNTER — Other Ambulatory Visit: Payer: Self-pay | Admitting: Family Medicine

## 2024-10-14 DIAGNOSIS — J4520 Mild intermittent asthma, uncomplicated: Secondary | ICD-10-CM

## 2024-10-15 ENCOUNTER — Telehealth (HOSPITAL_COMMUNITY): Admitting: Psychiatry

## 2024-10-15 VITALS — Wt 103.0 lb

## 2024-10-15 DIAGNOSIS — F411 Generalized anxiety disorder: Secondary | ICD-10-CM

## 2024-10-15 DIAGNOSIS — F331 Major depressive disorder, recurrent, moderate: Secondary | ICD-10-CM

## 2024-10-15 MED ORDER — DULOXETINE HCL 20 MG PO CPEP: 40.0000 mg | ORAL_CAPSULE | Freq: Every day | ORAL | 2 refills | Status: AC

## 2024-10-15 MED ORDER — AMITRIPTYLINE HCL 25 MG PO TABS
25.0000 mg | ORAL_TABLET | Freq: Every day | ORAL | 0 refills | Status: AC
Start: 2024-10-15 — End: ?

## 2024-10-15 MED ORDER — CLONAZEPAM 0.5 MG PO TABS: ORAL_TABLET | ORAL | 1 refills | Status: AC

## 2024-10-15 MED ORDER — LAMOTRIGINE 25 MG PO TABS: 25.0000 mg | ORAL_TABLET | Freq: Two times a day (BID) | ORAL | 2 refills | Status: AC

## 2024-10-15 NOTE — Progress Notes (Signed)
 [facility] MD Virtual Progress Note   Patient Location: Home Provider Location: Home Office  I connect with patient by video and verified that I am speaking with correct person by using two identifiers. I discussed the limitations of evaluation and management by telemedicine and the availability of in person appointments. I also discussed with the patient that there may be a patient responsible charge related to this service. The patient expressed understanding and agreed to proceed.  Sarah Rodriguez [id number] 68 y.o.  10/15/2024 2:27 PM  History of Present Illness:  Patient is evaluated by video session.  She reported things are going well and she is sleeping better.  She denies any crying spells, feeling of hopelessness or worthlessness.  Sometimes she feels tired and not sure if she need to go back on B12.  It was discontinued due to high level by her PCP.  She has appointment coming up in few days.  She is taking one fourth of Klonopin  when she feels very anxious and nervous specially if she is driving.  Lately she is putting time with her 32-month-old grandchild and feels good about the company of her grandchild.  She reported having travel plan to go to [place] with her husband.  She denies any major panic attack, crying spells, feeling of hopelessness or worthlessness.  Her appetite is fair.  Her weight is stable.  She is taking amitriptyline , Cymbalta , Lamictal  on a regular basis.  She has no rash, itching tremors or shakes.  She denies any paranoia or any suicidal thoughts.  She wants to keep the current medication.  Past Psychiatric History: H/O depression and taking antidepressants since 1996.  H/O panic attacks while driving.  Saw Dr. Elodie Anon and tried Prozac, Paxil, Zoloft, Xanax, Cymbalta  and Klonopin .  No h/o inpatient treatment or any suicidal attempt.   Past Medical History:  Diagnosis Date   Asthma    Dulera  daily   Chronic back pain    HNP    Complication of anesthesia    slow to wake up   Depression    takes Clonazepam  nightly   GERD (gastroesophageal reflux disease)    takes Protonix  daily    Heart murmur    History of migraine    takes Relpax  daily as needed   HSV (herpes simplex virus) infection    Hyperlipidemia    takes Atorvastatin  daily   Hypertension    takes Benicar  daily   Hypothyroidism    takes Synthroid  daily   MVP (mitral valve prolapse)    Pneumonia    hx of--been many yrs ago   Seasonal allergies    takes Allegra daily   Shortness of breath    with exertion    Outpatient Encounter Medications as of 10/15/2024  Medication Sig   albuterol  (VENTOLIN  HFA) 108 (90 Base) MCG/ACT inhaler Inhale 1-2 puffs into the lungs every 6 (six) hours as needed.   amitriptyline  (ELAVIL ) 25 MG tablet Take 1 tablet (25 mg total) by mouth at bedtime.   CINNAMON PO Take 1,000 Units by mouth 2 (two) times daily.    clonazePAM  (KLONOPIN ) 0.5 MG tablet Take 1/2 to 1 tablet as needed for severe anxiety.   Cyanocobalamin  (B-12) 2500 MCG TABS Take by mouth.   DULoxetine  (CYMBALTA ) 20 MG capsule Take 2 capsules (40 mg total) by mouth daily.   Fexofenadine-Pseudoephedrine  (ALLEGRA-D PO) Take by mouth.   fluticasone  (FLONASE ) 50 MCG/ACT nasal spray SHAKE LIQUID AND USE 2 SPRAYS IN EACH NOSTRIL EVERY EVENING  fluticasone -salmeterol (ADVAIR  HFA) 115-21 MCG/ACT inhaler INHALE 2 PUFFS BY MOUTH 15 MINUTES APART 2 TIMES A DAY (EVERY 12 HOURS) AS DIRECTED   lamoTRIgine  (LAMICTAL ) 25 MG tablet Take 1 tablet (25 mg total) by mouth 2 (two) times daily.   levothyroxine  (SYNTHROID ) 25 MCG tablet TAKE 1 TABLET BY MOUTH DAILY ON AN EMPTY STOMACH WITH ONLY WATER FOR 30 MINUTES AND NO ANTACID MEDS, CALCIUM  OR MAGNESIUM  FOR 4 HOURS *AVOID BIOTIN   losartan  (COZAAR ) 50 MG tablet TAKE 1 TABLET BY MOUTH DAILY   Magnesium  250 MG TABS Take 3 tablets by mouth daily.    meloxicam  (MOBIC ) 15 MG tablet Take  1 tablet Daily with Food for Pain &  Inflammation                                            /                      TAKE                   BY                      MOUTH   montelukast  (SINGULAIR ) 10 MG tablet Take 1 tablet daily for allergies   Multiple Vitamin (MULTI VITAMIN DAILY PO) Take by mouth.   nystatin  (MYCOSTATIN ) 100000 UNIT/ML suspension SWISH AND SWALLOW BY MOUTH FOUR TIMES A DAY FOR THRUSH OR YEAST INFECTION   pantoprazole  (PROTONIX ) 40 MG tablet TAKE 1 TABLET BY MOUTH DAILY   PREMARIN vaginal cream INSERT 1/2 GRAM VAGINALLY VIA APPLICATOR NIGHTLY FOR W EEKS THEN JUST DO TEICE A WEEK   rosuvastatin  (CRESTOR ) 40 MG tablet Take  1 tablet  2 x /week  for Cholesterol                                                      /                                 TAKE                  BY                MOUTH   Spacer/Aero-Holding Chambers (AEROCHAMBER MV) inhaler Use as instructed with inhaler.   tolterodine (DETROL LA) 2 MG 24 hr capsule Take 2 mg by mouth daily.   No facility-administered encounter medications on file as of 10/15/2024.    No results found for this or any previous visit (from the past 2160 hours).   Psychiatric Specialty Exam: Physical Exam  Review of Systems  Weight 103 lb (46.7 kg).There is no height or weight on file to calculate BMI.  General Appearance: Casual  Eye Contact:  Good  Speech:  Clear and Coherent and Normal Rate  Volume:  Normal  Mood:  Euthymic  Affect:  Congruent  Thought Process:  Goal Directed  Orientation:  Full (Time, Place, and Person)  Thought Content:  Logical  Suicidal Thoughts:  No  Homicidal Thoughts:  No  Memory:  Recent;   Good  Judgement:  Good  Insight:  Good  Psychomotor Activity:  Normal  Concentration:  Concentration: Good and Attention Span: Good  Recall:  Good  Fund of Knowledge:  Good  Language:  Good  Akathisia:  No  Handed:  Right  AIMS (if indicated):     Assets:  Communication Skills Desire for Improvement Housing Social  Support Talents/Skills Transportation  ADL's:  Intact  Cognition:  WNL  Sleep:  ok       07/17/2024   11:41 AM 07/02/2024    3:59 PM 03/09/2024    1:14 PM 07/13/2023    3:16 PM 01/06/2023   11:48 AM  Depression screen PHQ 2/9  Decreased Interest 0 0 0 0 1  Down, Depressed, Hopeless 0 0 1 1   PHQ - 2 Score 0 0 1 1 1   Altered sleeping 1 0 2    Tired, decreased energy 0 0 3    Change in appetite 0 0 2    Feeling bad or failure about yourself  0 0 2    Trouble concentrating 0 0 0    Moving slowly or fidgety/restless 0 0 0    Suicidal thoughts 0 0 0    PHQ-9 Score 1  0  10     Difficult doing work/chores Not difficult at all Not difficult at all        Data saved with a previous flowsheet row definition    Assessment/Plan: MDD (major depressive disorder), recurrent episode, moderate (HCC) - Plan: amitriptyline  (ELAVIL ) 25 MG tablet, clonazePAM  (KLONOPIN ) 0.5 MG tablet, DULoxetine  (CYMBALTA ) 20 MG capsule, lamoTRIgine  (LAMICTAL ) 25 MG tablet  GAD (generalized anxiety disorder) - Plan: amitriptyline  (ELAVIL ) 25 MG tablet, clonazePAM  (KLONOPIN ) 0.5 MG tablet, DULoxetine  (CYMBALTA ) 20 MG capsule  Patient is 68 year old Caucasian married female with history of hypertension, migraine, sleep apnea, GERD, hypothyroidism, generalized anxiety disorder, major depressive disorder and hearing impairment.  She reported symptoms are stable and no major concern or side effects from the medication.  She is going to talk to her PCP in few days to reconsider going on B12 and vitamin D .  They were discontinued after high levels.  Patient noticed sometimes she feels tired and wondering if she can go back to these vitamins.  Discussed medication side effects and benefits.  Continue amitriptyline  25 mg at bedtime, Cymbalta  20 mg twice a day, Lamictal  25 mg twice a day and Klonopin  one fourth as needed for severe anxiety.  Recommend to call back if she has any question or any concern.  Follow-up in 3  months   Follow Up Instructions:     I discussed the assessment and treatment plan with the patient. The patient was provided an opportunity to ask questions and all were answered. The patient agreed with the plan and demonstrated an understanding of the instructions.   The patient was advised to call back or seek an in-person evaluation if the symptoms worsen or if the condition fails to improve as anticipated.    Collaboration of Care: Other provider involved in patient's care AEB notes are available in epic to review  Patient/Guardian was advised Release of Information must be obtained prior to any record release in order to collaborate their care with an outside provider. Patient/Guardian was advised if they have not already done so to contact the registration department to sign all necessary forms in order for us  to release information regarding their care.   Consent: Patient/Guardian gives verbal consent for treatment and assignment of benefits for services  provided during this visit. Patient/Guardian expressed understanding and agreed to proceed.     Total encounter time 16 minutes which includes face-to-face time, chart reviewed, care coordination, order entry and documentation during this encounter.   Note: This document was prepared by [employer] voice dictation technology and any errors that results from this process are unintentional.    Sarah Rodriguez Client, MD 10/15/2024

## 2024-10-16 ENCOUNTER — Telehealth: Payer: Self-pay

## 2024-10-16 ENCOUNTER — Other Ambulatory Visit (HOSPITAL_COMMUNITY): Payer: Self-pay

## 2024-10-16 NOTE — Telephone Encounter (Signed)
 Pharmacy Patient Advocate Encounter   Received notification from Onbase that prior authorization for Fluticasone -Salmeterol 115-21MCG/ACT aerosol  is required/requested.   Insurance verification completed.   The patient is insured through [facility] [employer] ADVANTAGE/RX ADVANCE.   Per test claim: Refill too soon. PA is not needed at this time. Medication was filled 10/16/24. Next eligible fill date is 12/23/24.

## 2024-10-17 ENCOUNTER — Encounter: Payer: Self-pay | Admitting: Family Medicine

## 2024-10-17 ENCOUNTER — Ambulatory Visit: Admitting: Family Medicine

## 2024-10-17 ENCOUNTER — Ambulatory Visit: Payer: Self-pay | Admitting: Family Medicine

## 2024-10-17 VITALS — BP 126/84 | HR 64 | Temp 98.5°F | Ht 61.0 in | Wt 100.0 lb

## 2024-10-17 DIAGNOSIS — Z23 Encounter for immunization: Secondary | ICD-10-CM | POA: Diagnosis not present

## 2024-10-17 DIAGNOSIS — E538 Deficiency of other specified B group vitamins: Secondary | ICD-10-CM | POA: Diagnosis not present

## 2024-10-17 DIAGNOSIS — R5383 Other fatigue: Secondary | ICD-10-CM

## 2024-10-17 DIAGNOSIS — R7303 Prediabetes: Secondary | ICD-10-CM | POA: Diagnosis not present

## 2024-10-17 DIAGNOSIS — M791 Myalgia, unspecified site: Secondary | ICD-10-CM | POA: Diagnosis not present

## 2024-10-17 DIAGNOSIS — K219 Gastro-esophageal reflux disease without esophagitis: Secondary | ICD-10-CM

## 2024-10-17 DIAGNOSIS — E559 Vitamin D deficiency, unspecified: Secondary | ICD-10-CM | POA: Diagnosis not present

## 2024-10-17 DIAGNOSIS — M8580 Other specified disorders of bone density and structure, unspecified site: Secondary | ICD-10-CM

## 2024-10-17 DIAGNOSIS — I10 Essential (primary) hypertension: Secondary | ICD-10-CM | POA: Diagnosis not present

## 2024-10-17 DIAGNOSIS — G4730 Sleep apnea, unspecified: Secondary | ICD-10-CM

## 2024-10-17 DIAGNOSIS — Z0000 Encounter for general adult medical examination without abnormal findings: Secondary | ICD-10-CM

## 2024-10-17 DIAGNOSIS — E782 Mixed hyperlipidemia: Secondary | ICD-10-CM | POA: Diagnosis not present

## 2024-10-17 DIAGNOSIS — R079 Chest pain, unspecified: Secondary | ICD-10-CM

## 2024-10-17 DIAGNOSIS — Z0001 Encounter for general adult medical examination with abnormal findings: Secondary | ICD-10-CM

## 2024-10-17 DIAGNOSIS — E039 Hypothyroidism, unspecified: Secondary | ICD-10-CM | POA: Diagnosis not present

## 2024-10-17 DIAGNOSIS — R9431 Abnormal electrocardiogram [ECG] [EKG]: Secondary | ICD-10-CM

## 2024-10-17 LAB — COMPREHENSIVE METABOLIC PANEL WITH GFR
ALT: 21 U/L (ref 0–35)
AST: 23 U/L (ref 0–37)
Albumin: 4.3 g/dL (ref 3.5–5.2)
Alkaline Phosphatase: 45 U/L (ref 39–117)
BUN: 19 mg/dL (ref 6–23)
CO2: 31 meq/L (ref 19–32)
Calcium: 9.8 mg/dL (ref 8.4–10.5)
Chloride: 101 meq/L (ref 96–112)
Creatinine, Ser: 0.95 mg/dL (ref 0.40–1.20)
GFR: 61.98 mL/min (ref >60.00)
Glucose, Bld: 107 mg/dL — ABNORMAL HIGH (ref 70–99)
Potassium: 4.4 meq/L (ref 3.5–5.1)
Sodium: 140 meq/L (ref 135–145)
Total Bilirubin: 0.6 mg/dL (ref 0.2–1.2)
Total Protein: 6.6 g/dL (ref 6.0–8.3)

## 2024-10-17 LAB — LIPID PANEL
Cholesterol: 135 mg/dL (ref 0–200)
HDL: 49 mg/dL (ref >39.00)
LDL Cholesterol: 69 mg/dL (ref 0–99)
NonHDL: 86.31
Total CHOL/HDL Ratio: 3
Triglycerides: 88 mg/dL (ref 0.0–149.0)
VLDL: 17.6 mg/dL (ref 0.0–40.0)

## 2024-10-17 LAB — CBC WITH DIFFERENTIAL/PLATELET
Basophils Absolute: 0 K/uL (ref 0.0–0.1)
Basophils Relative: 0.3 % (ref 0.0–3.0)
Eosinophils Absolute: 0.1 K/uL (ref 0.0–0.7)
Eosinophils Relative: 2.4 % (ref 0.0–5.0)
HCT: 39.9 % (ref 36.0–46.0)
Hemoglobin: 13.5 g/dL (ref 12.0–15.0)
Lymphocytes Relative: 28.9 % (ref 12.0–46.0)
Lymphs Abs: 1.5 K/uL (ref 0.7–4.0)
MCHC: 33.7 g/dL (ref 30.0–36.0)
MCV: 96 fl (ref 78.0–100.0)
Monocytes Absolute: 0.4 K/uL (ref 0.1–1.0)
Monocytes Relative: 7.6 % (ref 3.0–12.0)
Neutro Abs: 3.1 K/uL (ref 1.4–7.7)
Neutrophils Relative %: 60.8 % (ref 43.0–77.0)
Platelets: 217 K/uL (ref 150.0–400.0)
RBC: 4.16 Mil/uL (ref 3.87–5.11)
RDW: 11.9 % (ref 11.5–15.5)
WBC: 5.1 K/uL (ref 4.0–10.5)

## 2024-10-17 LAB — VITAMIN B12: Vitamin B-12: 1208 pg/mL — ABNORMAL HIGH (ref 211–911)

## 2024-10-17 LAB — TSH: TSH: 1.13 u[IU]/mL (ref 0.35–5.50)

## 2024-10-17 LAB — HEMOGLOBIN A1C: Hgb A1c MFr Bld: 5.6 % (ref 4.6–6.5)

## 2024-10-17 LAB — TROPONIN I (HIGH SENSITIVITY): High Sens Troponin I: 4 ng/L (ref 2–17)

## 2024-10-17 LAB — MAGNESIUM: Magnesium: 2.1 mg/dL (ref 1.5–2.5)

## 2024-10-17 LAB — FOLATE: Folate: >23.7 ng/mL (ref >5.9)

## 2024-10-17 LAB — CK: Total CK: 49 U/L (ref 17–177)

## 2024-10-17 LAB — VITAMIN D 25 HYDROXY (VIT D DEFICIENCY, FRACTURES): VITD: 67.4 ng/mL (ref 30.00–100.00)

## 2024-10-17 LAB — FERRITIN: Ferritin: 25.7 ng/mL (ref 10.0–291.0)

## 2024-10-17 NOTE — Patient Instructions (Addendum)
 Please go downstairs for labs before you leave.  I am referring you to cardiology for consultation regarding recent chest pain and abnormal findings on EKG. they will call you to schedule appointment.  Let us  know if you have not heard from them in 2 weeks. If you have chest pain again that does not resolve within a couple of minutes or with rest, please call 911 or go to the emergency department.   Please check at the pharmacy regarding your Shingrix vaccines and COVID booster.

## 2024-10-17 NOTE — Progress Notes (Signed)
 Complete physical exam  Patient: Sarah Rodriguez   DOB: 12-22-1955   68 y.o. Female  MRN: [id number]  Subjective:    Chief Complaint  Patient presents with   Annual Exam    Fasting     She is here for a complete physical exam.  Discussed the use of AI scribe software for clinical note transcription with the patient, who gave verbal consent to proceed.  History of Present Illness KLOEY CAZAREZ is a 68 year old female who presents for an annual physical exam and preventive healthcare.  Fatigue and sleep disturbance - Significant fatigue without daytime sleepiness - Difficulty sleeping at night - Nocturnal snoring reduced with use of dental appliance for sleep apnea  Glucose intolerance and urinary symptoms - Prediabetes with A1c of 5.8% in May - Fasting today for recheck of cholesterol and A1c - Urinary frequency, especially after consuming sweet foods - No urinary incontinence - Prior use of Detrol discontinued due to increased dry mouth  Musculoskeletal symptoms - Muscle weakness and pain in legs, especially after physical activity - Decreased muscle mass, particularly in thighs - Leg pain persists for days after exertion - Engages in balance exercises  Bone density abnormality - Osteopenia identified by bone density study approximately one year ago  Gastroesophageal reflux disease - GERD managed by GI specialist - Pantoprazole  taken daily  Chest pain and anxiety - Occasional chest pain described as pressure - Chest pain occurs with exertion and resolves with rest - Chest pain associated with anxiety - Last episode of chest pain occurred two weeks ago - history of cardiology work up and stress test in distant past per patient   Auditory symptoms - Tinnitus - Hearing impairment with hearing aids currently being repaired - Fullness in ears - Distorted sounds, especially with loud noises  Vitamin deficiency - History of vitamin D  deficiency with recent  elevated levels - Discontinued vitamin D  and B12 supplements since May    Health Maintenance  Topic Date Due   Zoster (Shingles) Vaccine (1 of 2) Never done   Osteoporosis screening with Bone Density Scan  Never done   COVID-19 Vaccine (4 - 2025-26 season) 07/30/2024   Breast Cancer Screening  04/09/2025   Medicare Annual Wellness Visit  07/17/2025   DTaP/Tdap/Td vaccine (3 - Tdap) 05/05/2026   Colon Cancer Screening  02/26/2031   Pneumococcal Vaccine for age over 61  Completed   Flu Shot  Completed   Hepatitis C Screening  Completed   Meningitis B Vaccine  Aged Out     Depression screening:    10/17/2024   10:46 AM 10/17/2024   10:15 AM 07/17/2024   11:41 AM  Depression screen PHQ 2/9  Decreased Interest 0 1 0  Down, Depressed, Hopeless 0 0 0  PHQ - 2 Score 0 1 0  Altered sleeping 0  1  Tired, decreased energy 0  0  Change in appetite 0  0  Feeling bad or failure about yourself  0  0  Trouble concentrating 0  0  Moving slowly or fidgety/restless 0  0  Suicidal thoughts 0  0  PHQ-9 Score 0  1   Difficult doing work/chores   Not difficult at all     Data saved with a previous flowsheet row definition   Anxiety Screening:    05/18/2022    1:44 PM 05/07/2021   10:39 AM  GAD 7 : Generalized Anxiety Score  Nervous, Anxious, on Edge    Control/stop worrying  Worry too much - different things    Trouble relaxing    Restless    Easily annoyed or irritable    Afraid - awful might happen    Total GAD 7 Score    Anxiety Difficulty       Information is confidential and restricted. Go to Review Flowsheets to unlock data.     Patient Care Team: Lendia Boby CROME, NP-C as PCP - General (Family Medicine) Colon Shove, MD as Consulting Physician (Neurosurgery) Curry Leni DASEN, MD as Consulting Physician (Psychiatry) Oman, Heather , OD (Optometry)   Outpatient Medications Prior to Visit  Medication Sig   albuterol  (VENTOLIN  HFA) 108 (90 Base) MCG/ACT inhaler Inhale  1-2 puffs into the lungs every 6 (six) hours as needed.   amitriptyline  (ELAVIL ) 25 MG tablet Take 1 tablet (25 mg total) by mouth at bedtime.   CINNAMON PO Take 1,000 Units by mouth 2 (two) times daily.    clonazePAM  (KLONOPIN ) 0.5 MG tablet Take 1/2 to 1 tablet as needed for severe anxiety.   DULoxetine  (CYMBALTA ) 20 MG capsule Take 2 capsules (40 mg total) by mouth daily.   Fexofenadine-Pseudoephedrine  (ALLEGRA-D PO) Take by mouth.   fluticasone  (FLONASE ) 50 MCG/ACT nasal spray SHAKE LIQUID AND USE 2 SPRAYS IN EACH NOSTRIL EVERY EVENING   fluticasone -salmeterol (ADVAIR  HFA) 115-21 MCG/ACT inhaler INHALE 2 PUFFS BY MOUTH 15 MINUTES APART 2 TIMES A DAY (EVERY 12 HOURS) AS DIRECTED   lamoTRIgine  (LAMICTAL ) 25 MG tablet Take 1 tablet (25 mg total) by mouth 2 (two) times daily.   levothyroxine  (SYNTHROID ) 25 MCG tablet TAKE 1 TABLET BY MOUTH DAILY ON AN EMPTY STOMACH WITH ONLY WATER FOR 30 MINUTES AND NO ANTACID MEDS, CALCIUM  OR MAGNESIUM  FOR 4 HOURS *AVOID BIOTIN   losartan  (COZAAR ) 50 MG tablet TAKE 1 TABLET BY MOUTH DAILY   meloxicam  (MOBIC ) 15 MG tablet Take  1 tablet Daily with Food for Pain & Inflammation                                            /                      TAKE                   BY                      MOUTH   montelukast  (SINGULAIR ) 10 MG tablet Take 1 tablet daily for allergies   Multiple Vitamin (MULTI VITAMIN DAILY PO) Take by mouth.   nystatin  (MYCOSTATIN ) 100000 UNIT/ML suspension SWISH AND SWALLOW BY MOUTH FOUR TIMES A DAY FOR THRUSH OR YEAST INFECTION   pantoprazole  (PROTONIX ) 40 MG tablet TAKE 1 TABLET BY MOUTH DAILY   PREMARIN vaginal cream INSERT 1/2 GRAM VAGINALLY VIA APPLICATOR NIGHTLY FOR W EEKS THEN JUST DO TEICE A WEEK   rosuvastatin  (CRESTOR ) 40 MG tablet Take  1 tablet  2 x /week  for Cholesterol                                                      /  TAKE                  BY                MOUTH   Spacer/Aero-Holding Chambers  (AEROCHAMBER MV) inhaler Use as instructed with inhaler.   tolterodine (DETROL LA) 2 MG 24 hr capsule Take 2 mg by mouth daily.   [DISCONTINUED] Magnesium  250 MG TABS Take 3 tablets by mouth daily.  (Patient taking differently: Take by mouth daily. 500 mg daily)   Cyanocobalamin  (B-12) 2500 MCG TABS Take by mouth. (Patient not taking: Reported on 10/17/2024)   No facility-administered medications prior to visit.    Review of Systems  Constitutional:  Positive for malaise/fatigue. Negative for chills, fever and weight loss.  HENT:  Positive for hearing loss and tinnitus. Negative for congestion, ear pain, sinus pain and sore throat.   Eyes:  Negative for blurred vision, double vision and pain.  Respiratory:  Negative for cough, shortness of breath and wheezing.   Cardiovascular:  Positive for chest pain. Negative for palpitations and leg swelling.  Gastrointestinal:  Negative for abdominal pain, constipation, diarrhea, nausea and vomiting.  Genitourinary:  Positive for frequency. Negative for dysuria and urgency.  Musculoskeletal:  Positive for back pain and myalgias. Negative for joint pain.  Skin:  Negative for rash.  Neurological:  Negative for dizziness, tingling, focal weakness and headaches.  Psychiatric/Behavioral:  Negative for depression. The patient is not nervous/anxious.        Objective:    BP 126/84 (BP Location: Right Arm, Patient Position: Sitting, Cuff Size: Small)   Pulse 64   Temp 98.5 F (36.9 C)   Ht 5' 1 (1.549 m)   Wt 100 lb (45.4 kg)   SpO2 98%   BMI 18.89 kg/m  BP Readings from Last 3 Encounters:  10/17/24 126/84  07/17/24 122/70  07/02/24 (!) 142/88   Wt Readings from Last 3 Encounters:  10/17/24 100 lb (45.4 kg)  07/17/24 103 lb 3.2 oz (46.8 kg)  07/02/24 104 lb 3.2 oz (47.3 kg)    Physical Exam Constitutional:      General: She is not in acute distress.    Appearance: She is not ill-appearing.  HENT:     Right Ear: Tympanic membrane, ear  canal and external ear normal.     Left Ear: Tympanic membrane, ear canal and external ear normal.     Nose: Nose normal.     Mouth/Throat:     Mouth: Mucous membranes are moist.     Pharynx: Oropharynx is clear.  Eyes:     Extraocular Movements: Extraocular movements intact.     Conjunctiva/sclera: Conjunctivae normal.     Pupils: Pupils are equal, round, and reactive to light.  Neck:     Thyroid : No thyroid  mass, thyromegaly or thyroid  tenderness.  Cardiovascular:     Rate and Rhythm: Normal rate and regular rhythm.     Pulses: Normal pulses.     Heart sounds: Normal heart sounds.  Pulmonary:     Effort: Pulmonary effort is normal.     Breath sounds: Normal breath sounds.  Abdominal:     General: Bowel sounds are normal.     Palpations: Abdomen is soft.     Tenderness: There is no abdominal tenderness. There is no right CVA tenderness, left CVA tenderness, guarding or rebound.  Musculoskeletal:        General: Normal range of motion.     Cervical back: Normal range of  motion and neck supple. No tenderness.     Right lower leg: No edema.     Left lower leg: No edema.  Lymphadenopathy:     Cervical: No cervical adenopathy.  Skin:    General: Skin is warm and dry.     Findings: No lesion or rash.  Neurological:     General: No focal deficit present.     Mental Status: She is alert and oriented to person, place, and time.     Cranial Nerves: No cranial nerve deficit.     Sensory: No sensory deficit.     Motor: No weakness.     Gait: Gait normal.  Psychiatric:        Mood and Affect: Mood normal.        Behavior: Behavior normal.        Thought Content: Thought content normal.      Results for orders placed or performed in visit on 10/17/24  Magnesium   Result Value Ref Range   Magnesium  2.1 1.5 - 2.5 mg/dL  Folate  Result Value Ref Range   Folate >23.7 >5.9 ng/mL  Ferritin  Result Value Ref Range   Ferritin 25.7 10.0 - 291.0 ng/mL  VITAMIN D  25 Hydroxy (Vit-D  Deficiency, Fractures)  Result Value Ref Range   VITD 67.40 30.00 - 100.00 ng/mL  Vitamin B12  Result Value Ref Range   Vitamin B-12 1,208 (H) 211 - 911 pg/mL  TSH  Result Value Ref Range   TSH 1.13 0.35 - 5.50 uIU/mL  Lipid panel  Result Value Ref Range   Cholesterol 135 0 - 200 mg/dL   Triglycerides 11.9 0.0 - 149.0 mg/dL   HDL 50.99 >60.99 mg/dL   VLDL 82.3 0.0 - 59.9 mg/dL   LDL Cholesterol 69 0 - 99 mg/dL   Total CHOL/HDL Ratio 3    NonHDL 86.31   Hemoglobin A1c  Result Value Ref Range   Hgb A1c MFr Bld 5.6 4.6 - 6.5 %  Comprehensive metabolic panel with GFR  Result Value Ref Range   Sodium 140 135 - 145 mEq/L   Potassium 4.4 3.5 - 5.1 mEq/L   Chloride 101 96 - 112 mEq/L   CO2 31 19 - 32 mEq/L   Glucose, Bld 107 (H) 70 - 99 mg/dL   BUN 19 6 - 23 mg/dL   Creatinine, Ser 9.04 0.40 - 1.20 mg/dL   Total Bilirubin 0.6 0.2 - 1.2 mg/dL   Alkaline Phosphatase 45 39 - 117 U/L   AST 23 0 - 37 U/L   ALT 21 0 - 35 U/L   Total Protein 6.6 6.0 - 8.3 g/dL   Albumin 4.3 3.5 - 5.2 g/dL   GFR 38.01 >39.99 mL/min   Calcium  9.8 8.4 - 10.5 mg/dL  CBC with Differential/Platelet  Result Value Ref Range   WBC 5.1 4.0 - 10.5 K/uL   RBC 4.16 3.87 - 5.11 Mil/uL   Hemoglobin 13.5 12.0 - 15.0 g/dL   HCT 60.0 63.9 - 53.9 %   MCV 96.0 78.0 - 100.0 fl   MCHC 33.7 30.0 - 36.0 g/dL   RDW 88.0 88.4 - 84.4 %   Platelets 217.0 150.0 - 400.0 K/uL   Neutrophils Relative % 60.8 43.0 - 77.0 %   Lymphocytes Relative 28.9 12.0 - 46.0 %   Monocytes Relative 7.6 3.0 - 12.0 %   Eosinophils Relative 2.4 0.0 - 5.0 %   Basophils Relative 0.3 0.0 - 3.0 %   Neutro Abs 3.1 1.4 -  7.7 K/uL   Lymphs Abs 1.5 0.7 - 4.0 K/uL   Monocytes Absolute 0.4 0.1 - 1.0 K/uL   Eosinophils Absolute 0.1 0.0 - 0.7 K/uL   Basophils Absolute 0.0 0.0 - 0.1 K/uL  CK (Creatine Kinase)  Result Value Ref Range   Total CK 49 17 - 177 U/L  Troponin I (High Sensitivity)  Result Value Ref Range   High Sens Troponin I 4 2 - 17 ng/L       Assessment & Plan:    Routine Health Maintenance and Physical Exam Problem List Items Addressed This Visit     B12 deficiency   Relevant Orders   Vitamin B12 (Completed)   Essential hypertension   Relevant Orders   CBC with Differential/Platelet (Completed)   Comprehensive metabolic panel with GFR (Completed)   GERD (gastroesophageal reflux disease)   Hyperlipidemia, mixed   Relevant Orders   Lipid panel (Completed)   Hypothyroidism   Relevant Orders   TSH (Completed)   Sleep apnea   Vitamin D  deficiency   Relevant Orders   VITAMIN D  25 Hydroxy (Vit-D Deficiency, Fractures) (Completed)   Other Visit Diagnoses       Encounter for general adult medical examination with abnormal findings    -  Primary     Need for influenza vaccination       Relevant Orders   Flu vaccine HIGH DOSE PF(Fluzone Trivalent) (Completed)     Prediabetes       Relevant Orders   CBC with Differential/Platelet (Completed)   Comprehensive metabolic panel with GFR (Completed)   Hemoglobin A1c (Completed)     Fatigue, unspecified type       Relevant Orders   CBC with Differential/Platelet (Completed)   Comprehensive metabolic panel with GFR (Completed)   Hemoglobin A1c (Completed)   Vitamin B12 (Completed)   Ferritin (Completed)   Folate (Completed)     Osteopenia, unspecified location         Myalgia       Relevant Orders   CK (Creatine Kinase) (Completed)   Magnesium  (Completed)     Exertional chest pain       Relevant Orders   CBC with Differential/Platelet (Completed)   Comprehensive metabolic panel with GFR (Completed)   Troponin I (High Sensitivity) (Completed)   Ambulatory referral to Cardiology   EKG 12-Lead (Completed)     Abnormal EKG       Relevant Orders   Ambulatory referral to Cardiology       Assessment and Plan Assessment & Plan Adult Wellness Visit Annual physical exam conducted. Discussed preventive healthcare measures including vaccinations and  screenings. - Ordered labs including cholesterol, A1c, vitamin D , B12, and thyroid  function tests - Discussed shingles vaccine and COVID booster at pharmacy - Encouraged continuation of balance exercises and pelvic floor exercises - Sees OB/GYN.  - Reports being UTD with DEXA which showed osteopenia. I will request from OB/GYN office   Chest pain, exertional, under evaluation Intermittent exertional chest pain for several weeks, relieved by rest. Last episode 2 weekas ago, lasted 2 minutes and relieved with rest.  No recent cardiology evaluation. Previous stress test conducted years ago, ?2006. - Ordered EKG - Referred to cardiology for further evaluation  EKG shows NSR, rate 71, T wave abnormality in anterior leads, no acute ischemic pattern   Essential hypertension Hypertension management ongoing. - Controlled.  - check renal function   Mixed hyperlipidemia Cholesterol levels previously checked in May were within normal range. Current fasting status allows  for re-evaluation. - Ordered cholesterol panel  Prediabetes A1c previously at 5.8%, indicating prediabetes. No acute symptoms reported. - Ordered A1c test  Hypothyroidism Thyroid  function to be re-evaluated. - Ordered thyroid  function tests  Vitamin D  deficiency with recent elevated vitamin D  Previous vitamin D  levels were elevated. Currently not taking vitamin D  supplements. - Ordered vitamin D  level test  Deficiency of other specified B group vitamins B12 levels to be re-evaluated. - Ordered B12 level test  Gastroesophageal reflux disease (GERD) GERD managed with pantoprazole . GI specialist aware of condition.  Sleep apnea Managed with dental appliance.  Chronic fatigue Reports fatigue without daytime sleepiness.  Myalgia, thighs and legs Chronic muscle pain in thighs and legs, possibly related to previous back surgeries and statin use. No acute changes reported. - Ordered muscle enzyme test     Return in  about 6 months (around 04/16/2025) for chronic health conditions.     Boby Mackintosh, NP-C

## 2024-10-19 ENCOUNTER — Other Ambulatory Visit (HOSPITAL_COMMUNITY): Payer: Self-pay | Admitting: Psychiatry

## 2024-10-19 DIAGNOSIS — F331 Major depressive disorder, recurrent, moderate: Secondary | ICD-10-CM

## 2024-10-19 DIAGNOSIS — F411 Generalized anxiety disorder: Secondary | ICD-10-CM

## 2024-10-28 ENCOUNTER — Other Ambulatory Visit (HOSPITAL_COMMUNITY): Payer: Self-pay | Admitting: Psychiatry

## 2024-10-28 DIAGNOSIS — F331 Major depressive disorder, recurrent, moderate: Secondary | ICD-10-CM

## 2024-10-28 DIAGNOSIS — F411 Generalized anxiety disorder: Secondary | ICD-10-CM

## 2024-11-01 ENCOUNTER — Encounter: Payer: PPO | Admitting: Internal Medicine

## 2024-11-13 ENCOUNTER — Ambulatory Visit: Attending: Cardiology | Admitting: Cardiology

## 2024-11-13 ENCOUNTER — Encounter: Payer: Self-pay | Admitting: Cardiology

## 2024-11-13 VITALS — BP 162/82 | HR 79 | Ht 61.0 in | Wt 99.2 lb

## 2024-11-13 DIAGNOSIS — E782 Mixed hyperlipidemia: Secondary | ICD-10-CM

## 2024-11-13 DIAGNOSIS — R072 Precordial pain: Secondary | ICD-10-CM

## 2024-11-13 DIAGNOSIS — R0602 Shortness of breath: Secondary | ICD-10-CM

## 2024-11-13 DIAGNOSIS — I10 Essential (primary) hypertension: Secondary | ICD-10-CM

## 2024-11-13 MED ORDER — METOPROLOL TARTRATE 25 MG PO TABS: 25.0000 mg | ORAL_TABLET | Freq: Two times a day (BID) | ORAL | 0 refills | Status: AC

## 2024-11-13 NOTE — Progress Notes (Signed)
 Cardiology Office Note:    Date:  11/13/2024  NAME:  Sarah Rodriguez    MRN: [id number] DOB:  1956-09-03   PCP:  Lendia Boby CROME, NP-C  Former Cardiology Providers: N/A Primary Cardiologist:  Madonna Large, DO, [facility] (established care 11/13/2024) Electrophysiologist:  None   Referring MD: Lendia Boby CROME, NP-C  Reason of Consult: Chest pain  Chief Complaint  Patient presents with   Chest Pain   Abnormal ECG   New Patient (Initial Visit)    History of Present Illness:    Sarah Rodriguez is a 68 y.o. Caucasian female whose past medical history and cardiovascular risk factors includes: Thyroid  disease, hypertension, hyperlipidemia, family hx of heart disease.   She is being seen today for the evaluation of chest pain at the request of Henson, Vickie L, NP-C.  Patient is accompanied by her husband at today's office visit.  Chest Pain Onset: summer times Last occurrence: four days ago Location: substernal - tightness Intensity: 6/10 Frequency: weekly Duration: 20 minutes  Improving factors: rest Worsening factors: exercise, housework, and walking Do symptoms worsen with effort related activities: Yes Do symptoms improve with resting or sublingual nitroglycerin tabs: Yes, improves with resting Risk Factors: Hypertension and Lipids and HTN and prediabetes   Also has been having Shortness of breath with effort related activities. No heart failure symptoms.   Home SBP has been running high but forgot to get the BP log for review.   Has noticed reduced physical endurance.    Current Medications: Current Meds  Medication Sig   albuterol  (VENTOLIN  HFA) 108 (90 Base) MCG/ACT inhaler Inhale 1-2 puffs into the lungs every 6 (six) hours as needed.   amitriptyline  (ELAVIL ) 25 MG tablet Take 1 tablet (25 mg total) by mouth at bedtime.   CINNAMON PO Take 1,000 Units by mouth 2 (two) times daily.    clonazePAM  (KLONOPIN ) 0.5 MG tablet Take 1/2 to 1 tablet as needed for severe  anxiety.   Cyanocobalamin  (B-12) 2500 MCG TABS Take by mouth.   DULoxetine  (CYMBALTA ) 20 MG capsule Take 2 capsules (40 mg total) by mouth daily.   fluticasone  (FLONASE ) 50 MCG/ACT nasal spray SHAKE LIQUID AND USE 2 SPRAYS IN EACH NOSTRIL EVERY EVENING   fluticasone -salmeterol (ADVAIR  HFA) 115-21 MCG/ACT inhaler INHALE 2 PUFFS BY MOUTH 15 MINUTES APART 2 TIMES A DAY (EVERY 12 HOURS) AS DIRECTED   lamoTRIgine  (LAMICTAL ) 25 MG tablet Take 1 tablet (25 mg total) by mouth 2 (two) times daily.   levothyroxine  (SYNTHROID ) 25 MCG tablet TAKE 1 TABLET BY MOUTH DAILY ON AN EMPTY STOMACH WITH ONLY WATER FOR 30 MINUTES AND NO ANTACID MEDS, CALCIUM  OR MAGNESIUM  FOR 4 HOURS *AVOID BIOTIN   losartan  (COZAAR ) 50 MG tablet TAKE 1 TABLET BY MOUTH DAILY   meloxicam  (MOBIC ) 15 MG tablet Take  1 tablet Daily with Food for Pain & Inflammation                                            /                      TAKE                   BY                      MOUTH  metoprolol  tartrate (LOPRESSOR ) 25 MG tablet Take 1 tablet (25 mg total) by mouth 2 (two) times daily. For one (1) week prior to CT scan.   montelukast  (SINGULAIR ) 10 MG tablet Take 1 tablet daily for allergies   Multiple Vitamin (MULTI VITAMIN DAILY PO) Take by mouth.   nystatin  (MYCOSTATIN ) 100000 UNIT/ML suspension SWISH AND SWALLOW BY MOUTH FOUR TIMES A DAY FOR THRUSH OR YEAST INFECTION   pantoprazole  (PROTONIX ) 40 MG tablet TAKE 1 TABLET BY MOUTH DAILY   PREMARIN vaginal cream INSERT 1/2 GRAM VAGINALLY VIA APPLICATOR NIGHTLY FOR W EEKS THEN JUST DO TEICE A WEEK   pseudoephedrine  (SUDAFED) 60 MG tablet Take 60 mg by mouth every 4 (four) hours as needed for congestion.   rosuvastatin  (CRESTOR ) 40 MG tablet Take  1 tablet  2 x /week  for Cholesterol                                                      /                                 TAKE                  BY                MOUTH   Spacer/Aero-Holding Chambers (AEROCHAMBER MV) inhaler Use as instructed  with inhaler.   tolterodine (DETROL LA) 2 MG 24 hr capsule Take 2 mg by mouth daily.     Allergies:    Demerol [meperidine], Epinephrine , and Niacin and related   Past Medical History: Past Medical History:  Diagnosis Date   Asthma    Dulera  daily   Chronic back pain    HNP   Complication of anesthesia    slow to wake up   Depression    takes Clonazepam  nightly   GERD (gastroesophageal reflux disease)    takes Protonix  daily    Heart murmur    History of migraine    takes Relpax  daily as needed   HSV (herpes simplex virus) infection    Hyperlipidemia    takes Atorvastatin  daily   Hypertension    takes Benicar  daily   Hypothyroidism    takes Synthroid  daily   MVP (mitral valve prolapse)    Pneumonia    hx of--been many yrs ago   Seasonal allergies    takes Allegra daily   Shortness of breath    with exertion    Past Surgical History: Past Surgical History:  Procedure Laterality Date   ABLATION  2011   Uterine   buttocks surgery     at age 42 (coccyx repair)   CERVICAL FUSION  03/2010   CESAREAN SECTION  1992   CHOLECYSTECTOMY  2008   COLONOSCOPY     DILATION AND CURETTAGE OF UTERUS  2011   ESOPHAGOGASTRODUODENOSCOPY     LUMBAR LAMINECTOMY/DECOMPRESSION MICRODISCECTOMY  10/24/2012   Procedure: LUMBAR LAMINECTOMY/DECOMPRESSION MICRODISCECTOMY 1 LEVEL;  Surgeon: Victory Gens, MD;  Location: [facility];  Service: Neurosurgery;  Laterality: Right;  Right Lumbar five-Sacral One Microdiskectomy   right knee arthroscopy     x 3   TONSILLECTOMY     at age 67    Social History: Social History[1]  Family History:  Family History  Problem Relation Age of Onset   Arthritis Mother    Heart attack Mother    Depression Mother    Allergies Mother    Hypertension Mother    Heart attack Father     ROS:   Review of Systems  HENT:         Heard of hearing  Cardiovascular:  Positive for chest pain and dyspnea on exertion. Negative for claudication, irregular  heartbeat, leg swelling, near-syncope, orthopnea, palpitations, paroxysmal nocturnal dyspnea and syncope.  Respiratory:  Negative for shortness of breath.   Hematologic/Lymphatic: Negative for bleeding problem.    Studies Reviewed:   EKG: EKG Interpretation Date/Time:  Tuesday November 13 2024 11:11:55 EST Ventricular Rate:  80 PR Interval:  216 QRS Duration:  74 QT Interval:  376 QTC Calculation: 433 R Axis:   52  Text Interpretation: Sinus rhythm with sinus arrhythmia with 1st degree A-V block Low voltage QRS Nonspecific T wave abnormality When compared with ECG of 24-Oct-2012 06:48, PR interval has increased Nonspecific T wave abnormality now evident in Lateral leads Confirmed by Michele Richardson [phone]) on 11/13/2024 11:25:40 AM  Echocardiogram: None  Labs:    Latest Ref Rng & Units 10/17/2024   11:08 AM 03/09/2024    1:40 PM 10/13/2023    3:17 PM  CBC  WBC 4.0 - 10.5 K/uL 5.1  5.8  7.0   Hemoglobin 12.0 - 15.0 g/dL 86.4  85.2  85.6   Hematocrit 36.0 - 46.0 % 39.9  44.2  42.3   Platelets 150.0 - 400.0 K/uL 217.0  257.0  257        Latest Ref Rng & Units 10/17/2024   11:08 AM 03/09/2024    1:40 PM 10/13/2023    3:17 PM  BMP  Glucose 70 - 99 mg/dL 892  893  881   BUN 6 - 23 mg/dL 19  17  16    Creatinine 0.40 - 1.20 mg/dL 9.04  9.07  8.97   BUN/Creat Ratio 6 - 22 (calc)   SEE NOTE:   Sodium 135 - 145 mEq/L 140  142  141   Potassium 3.5 - 5.1 mEq/L 4.4  4.4  4.8   Chloride 96 - 112 mEq/L 101  103  101   CO2 19 - 32 mEq/L 31  32  32   Calcium  8.4 - 10.5 mg/dL 9.8  9.8  89.9       Latest Ref Rng & Units 10/17/2024   11:08 AM 03/09/2024    1:40 PM 10/13/2023    3:17 PM  CMP  Glucose 70 - 99 mg/dL 892  893  881   BUN 6 - 23 mg/dL 19  17  16    Creatinine 0.40 - 1.20 mg/dL 9.04  9.07  8.97   Sodium 135 - 145 mEq/L 140  142  141   Potassium 3.5 - 5.1 mEq/L 4.4  4.4  4.8   Chloride 96 - 112 mEq/L 101  103  101   CO2 19 - 32 mEq/L 31  32  32   Calcium  8.4 - 10.5 mg/dL  9.8  9.8  89.9   Total Protein 6.0 - 8.3 g/dL 6.6  7.2  6.8   Total Bilirubin 0.2 - 1.2 mg/dL 0.6  0.5  0.4   Alkaline Phos 39 - 117 U/L 45  57    AST 0 - 37 U/L 23  25  23    ALT 0 - 35 U/L 21  27  20  Lab Results  Component Value Date   CHOL 135 10/17/2024   HDL 49.00 10/17/2024   LDLCALC 69 10/17/2024   TRIG 88.0 10/17/2024   CHOLHDL 3 10/17/2024   No results for input(s): LIPOA in the last 8760 hours. No components found for: NTPROBNP No results for input(s): PROBNP in the last 8760 hours. Recent Labs    03/09/24 1340 10/17/24 1108  TSH 1.44 1.13    Physical Exam:    Today's Vitals   11/13/24 1110  BP: (!) 162/82  Pulse: 79  SpO2: 98%  Weight: 99 lb 3.2 oz (45 kg)  Height: 5' 1 (1.549 m)   Body mass index is 18.74 kg/m. Wt Readings from Last 3 Encounters:  11/13/24 99 lb 3.2 oz (45 kg)  10/17/24 100 lb (45.4 kg)  07/17/24 103 lb 3.2 oz (46.8 kg)    Physical Exam  Constitutional: No distress.  hemodynamically stable  Neck: No JVD present.  Cardiovascular: Normal rate, regular rhythm, S1 normal and S2 normal. Exam reveals no gallop, no S3 and no S4.  No murmur heard. Pulmonary/Chest: Effort normal and breath sounds normal. No stridor. She has no wheezes. She has no rales.  Musculoskeletal:        General: No edema.     Cervical back: Neck supple.  Skin: Skin is warm.     Impression & Recommendation(s):  Impression:   ICD-10-CM   1. Precordial pain  R07.2 EKG 12-Lead    ECHOCARDIOGRAM COMPLETE    CT CORONARY MORPH W/CTA COR W/SCORE W/CA W/CM &/OR WO/CM    2. Shortness of breath  R06.02     3. Essential hypertension  I10     4. Mixed hyperlipidemia  E78.2        Recommendation(s):  Precordial pain Shortness of breath Chest pain concerning for cardiac etiology. EKG does not show active underlying injury or ischemia pattern.  Nonspecific ST-T changes Echo will be ordered to evaluate for structural heart disease and left ventricular  systolic function. Coronary CTA to evaluate for CAC, plaque burden, and obstructive disease Lopressor  25 mg p.o. twice daily to start a week prior to her coronary CTA She has had recent blood work. Educated her on seeking medical attention sooner by going to the closest ER via EMS if the symptoms increase in intensity, frequency, duration, or has typical chest pain as discussed in the office.  Patient verbalized understanding.  Essential hypertension Office and home blood pressures are not at goal. Currently takes losartan  50 mg p.o. every pm. Would recommend increasing losartan  to 100 mg p.o. every morning, patient would like to discuss this further with her PCP. Patient states she prefers to be on less medications Reemphasized importance of low-salt diet. Goal SBP ~131mmHg  Mixed hyperlipidemia Currently on rosuvastatin  40 mg twice a week, as per PCP. Recommend transitioning to rosuvastatin  10 mg p.o. daily, she will discuss with PCP. LDL currently at goal, 69 mg/dL as of November 2025   Orders Placed:  Orders Placed This Encounter  Procedures   CT CORONARY MORPH W/CTA COR W/SCORE W/CA W/CM &/OR WO/CM    Standing Status:   Future    Expected Date:   11/27/2024    Expiration Date:   11/13/2025    If indicated for the ordered procedure, I authorize the administration of contrast media per Radiology protocol:   Yes    Does the patient have a contrast media/X-ray dye allergy ?:   No    [facility]    Authorization::   BMI under 55   EKG 12-Lead   ECHOCARDIOGRAM COMPLETE    Standing Status:   Future    Expected Date:   11/20/2024    Expiration Date:   11/13/2025    Where should this test [facility] performed[facility]    Does the patient weigh less than or greater than 250 lbs?:   Patient weighs less than 250 lbs    Perflutren DEFINITY (image enhancing agent) should be administered unless hypersensitivity or allergy  exist:    Administer Perflutren    Reason for exam-Echo:   Other-Full Diagnosis List    Full ICD-10/Reason for Exam:   Precordial chest pain [186069]     Final Medication List:    Meds ordered this encounter  Medications   metoprolol  tartrate (LOPRESSOR ) 25 MG tablet    Sig: Take 1 tablet (25 mg total) by mouth 2 (two) times daily. For one (1) week prior to CT scan.    Dispense:  14 tablet    Refill:  0    Medications Discontinued During This Encounter  Medication Reason   Fexofenadine-Pseudoephedrine  (ALLEGRA-D PO) Patient Preference    Current Medications[2]  Consent:   N/A  Disposition:   6 weeks sooner if needed  Medical Decision Making:  I have reviewed external notes provided by Lendia Boby CROME, NP-C , dated 10/17/2024.   Discussed management of at least two chronic comorbid conditions.  I have independently interpreted results of the EKG dated 11/13/2024, labs dated 09/2024, and reviewed the results under studies reviewed as part of medical decision making.   Prescription drug management including but not limited to: addition/titration/discontinuation of medical therapy, medication reconciliation, discussed potential side effects, follow up labs either reviewed or ordered for monitoring safety.   Diagnostic testing ordered - see above.  Patient participated in shared decision making regarding her cardiovascular care and informed consent obtained when required.  I have independently formulated and discussed the assessment and plan with the patient. This note will be shared with the patient's primary care provider to coordinate care.   Educated her on seeking medical attention sooner by going to the closest ER via EMS if the symptoms increase in intensity, frequency, duration, or has typical chest pain as discussed in the office.  Patient verbalized understanding.    Signed, Madonna Large, DO, [facility]  A Division of [facility] [address], [place], [place] [address]      [1]  Social History Tobacco Use   Smoking status: Former   Smokeless tobacco: Never   Tobacco comments:    quit at age 56  Vaping Use   Vaping status: Never Used  Substance Use Topics   Alcohol use: No    Comment: 2-3 times a yr   Drug use: No  [2]  Current Outpatient Medications:    albuterol  (VENTOLIN  HFA) 108 (90 Base) MCG/ACT inhaler, Inhale 1-2 puffs into the lungs every 6 (six) hours as needed., Disp: 1 each, Rfl: 0   amitriptyline  (ELAVIL ) 25 MG tablet, Take 1 tablet (25 mg total) by mouth at bedtime., Disp: 90 tablet, Rfl: 0   CINNAMON PO, Take 1,000 Units by mouth 2 (two) times daily. , Disp: , Rfl:    clonazePAM  (KLONOPIN ) 0.5 MG tablet, Take 1/2 to 1 tablet as needed for severe anxiety., Disp: 30 tablet, Rfl: 1   Cyanocobalamin  (B-12) 2500 MCG TABS, Take by mouth., Disp: ,  Rfl:    DULoxetine  (CYMBALTA ) 20 MG capsule, Take 2 capsules (40 mg total) by mouth daily., Disp: 60 capsule, Rfl: 2   fluticasone  (FLONASE ) 50 MCG/ACT nasal spray, SHAKE LIQUID AND USE 2 SPRAYS IN EACH NOSTRIL EVERY EVENING, Disp: 48 g, Rfl: 3   fluticasone -salmeterol (ADVAIR  HFA) 115-21 MCG/ACT inhaler, INHALE 2 PUFFS BY MOUTH 15 MINUTES APART 2 TIMES A DAY (EVERY 12 HOURS) AS DIRECTED, Disp: 36 g, Rfl: 1   lamoTRIgine  (LAMICTAL ) 25 MG tablet, Take 1 tablet (25 mg total) by mouth 2 (two) times daily., Disp: 60 tablet, Rfl: 2   levothyroxine  (SYNTHROID ) 25 MCG tablet, TAKE 1 TABLET BY MOUTH DAILY ON AN EMPTY STOMACH WITH ONLY WATER FOR 30 MINUTES AND NO ANTACID MEDS, CALCIUM  OR MAGNESIUM  FOR 4 HOURS *AVOID BIOTIN, Disp: 90 tablet, Rfl: 1   losartan  (COZAAR ) 50 MG tablet, TAKE 1 TABLET BY MOUTH DAILY, Disp: 90 tablet, Rfl: 1   meloxicam  (MOBIC ) 15 MG tablet, Take  1 tablet Daily with Food for Pain & Inflammation                                            /                      TAKE                   BY                      MOUTH, Disp: 90 tablet, Rfl: 3   metoprolol   tartrate (LOPRESSOR ) 25 MG tablet, Take 1 tablet (25 mg total) by mouth 2 (two) times daily. For one (1) week prior to CT scan., Disp: 14 tablet, Rfl: 0   montelukast  (SINGULAIR ) 10 MG tablet, Take 1 tablet daily for allergies, Disp: 90 tablet, Rfl: 3   Multiple Vitamin (MULTI VITAMIN DAILY PO), Take by mouth., Disp: , Rfl:    nystatin  (MYCOSTATIN ) 100000 UNIT/ML suspension, SWISH AND SWALLOW BY MOUTH FOUR TIMES A DAY FOR THRUSH OR YEAST INFECTION, Disp: 473 mL, Rfl: 0   pantoprazole  (PROTONIX ) 40 MG tablet, TAKE 1 TABLET BY MOUTH DAILY, Disp: 90 tablet, Rfl: 3   PREMARIN vaginal cream, INSERT 1/2 GRAM VAGINALLY VIA APPLICATOR NIGHTLY FOR W EEKS THEN JUST DO TEICE A WEEK, Disp: , Rfl: 11   pseudoephedrine  (SUDAFED) 60 MG tablet, Take 60 mg by mouth every 4 (four) hours as needed for congestion., Disp: , Rfl:    rosuvastatin  (CRESTOR ) 40 MG tablet, Take  1 tablet  2 x /week  for Cholesterol                                                      /                                 TAKE                  BY                MOUTH, Disp: 90 tablet, Rfl: 1   Spacer/Aero-Holding Chambers (AEROCHAMBER MV)  inhaler, Use as instructed with inhaler., Disp: 1 each, Rfl: 0   tolterodine (DETROL LA) 2 MG 24 hr capsule, Take 2 mg by mouth daily., Disp: , Rfl:

## 2024-11-13 NOTE — Patient Instructions (Addendum)
 Medication Instructions:  TAKE Metoprolol  Tartrate (Lopressor ) 25 mg. Take one (1) tablet by mouth twice daily for one (1) week prior to CT scan. *If you need a refill on your cardiac medications before your next appointment, please call your pharmacy*  Lab Work: None ordered If you have labs (blood work) drawn today and your tests are completely normal, you will receive your results only by: MyChart Message (if you have MyChart) OR A paper copy in the mail If you have any lab test that is abnormal or we need to change your treatment, we will call you to review the results.  Testing/Procedures: Echocardiogram  Coronary CTA  Follow-Up: At [facility], you and your health needs are our priority.  As part of our continuing mission to provide you with exceptional heart care, our providers are all part of one team.  This team includes your primary Cardiologist (physician) and Advanced Practice Providers or APPs (Physician Assistants and Nurse Practitioners) who all work together to provide you with the care you need, when you need it.  Your next appointment:   6 week(s)  Provider:   Madonna Large, DO    We recommend signing up for the patient portal called MyChart.  Sign up information is provided on this After Visit Summary.  MyChart is used to connect with patients for Virtual Visits (Telemedicine).  Patients are able to view lab/test results, encounter notes, upcoming appointments, etc.  Non-urgent messages can be sent to your provider as well.   To learn more about what you can do with MyChart, go to [email or web address].   Other Instructions Your physician has requested that you have an echocardiogram. Echocardiography is a painless test that uses sound waves to create images of your heart. It provides your doctor with information about the size and shape of your heart and how well your hearts chambers and valves are working. This procedure takes approximately one hour.  There are no restrictions for this procedure. Please do NOT wear cologne, perfume, aftershave, or lotions (deodorant is allowed). Please arrive 15 minutes prior to your appointment time.  Please note: We ask at that you not bring children with you during ultrasound (echo/ vascular) testing. Due to room size and safety concerns, children are not allowed in the ultrasound rooms during exams. Our front office staff cannot provide observation of children in our lobby area while testing is being conducted. An adult accompanying a patient to their appointment will only be allowed in the ultrasound room at the discretion of the ultrasound technician under special circumstances. We apologize for any inconvenience.     Your cardiac CT will be scheduled at   [facility] [address]  [place], [place] [address]   At the [facility] at [address], please enter the parking lot using the [address] entrance and use the FREE valet service at the patient drop-off area. Enter the building and check-in with registration on the main floor.   Please follow these instructions carefully (unless otherwise directed):  An IV will be required for this test and Nitroglycerin will be given.  Hold all erectile dysfunction medications at least 3 days (72 hrs) prior to test. (Ie viagra, cialis, sildenafil, tadalafil, etc)   On the Night Before the Test: Be sure to Drink plenty of water. Do not consume any caffeinated/decaffeinated beverages or chocolate 12 hours prior to your test. Do not take any antihistamines 12 hours prior to your test.  On the Day of the Test: Drink plenty of water until 1 hour prior to the test. Do not eat any food 1 hour prior to test. You may take your regular medications prior to the test.  Take metoprolol  (Lopressor ) twice daily for one (1) week prior to test. Patients who wear a continuous glucose monitor MUST remove the device  prior to scanning. FEMALES- please wear underwire-free bra if available, avoid dresses & tight clothing  After the Test: Drink plenty of water. After receiving IV contrast, you may experience a mild flushed feeling. This is normal. On occasion, you may experience a mild rash up to 24 hours after the test. This is not dangerous. If this occurs, you can take Benadryl 25 mg, Zyrtec , Claritin , or Allegra and increase your fluid intake. (Patients taking Tikosyn should avoid Benadryl, and may take Zyrtec , Claritin , or Allegra) If you experience trouble breathing, this can be serious. If it is severe call 911 IMMEDIATELY. If it is mild, please call our office.  We will call to schedule your test 2-4 weeks out understanding that some insurance companies will need an authorization prior to the service being performed.   For more information and frequently asked questions, please visit our website : [email or web address]  For non-scheduling related questions, please contact the cardiac imaging nurse navigator should you have any questions/concerns: Cardiac Imaging Nurse Navigators Direct Office Dial: [phone]   For scheduling needs, including cancellations and rescheduling, please call Brittany, [phone].

## 2024-11-24 ENCOUNTER — Other Ambulatory Visit: Payer: Self-pay | Admitting: Cardiology

## 2024-11-26 ENCOUNTER — Encounter (HOSPITAL_COMMUNITY): Payer: Self-pay

## 2024-11-28 ENCOUNTER — Ambulatory Visit (HOSPITAL_COMMUNITY)
Admission: RE | Admit: 2024-11-28 | Discharge: 2024-11-28 | Disposition: A | Discharge status: Home / Self Care | Source: Ambulatory Visit | Attending: Student in an Organized Health Care Education/Training Program | Admitting: Student in an Organized Health Care Education/Training Program

## 2024-11-28 DIAGNOSIS — R072 Precordial pain: Secondary | ICD-10-CM | POA: Insufficient documentation

## 2024-11-28 MED ORDER — IOHEXOL 350 MG/ML SOLN
100.0000 mL | Freq: Once | INTRAVENOUS | Status: AC | PRN
  Administered 2024-11-28: 100 mL via INTRAVENOUS

## 2024-11-28 MED ORDER — NITROGLYCERIN 0.4 MG SL SUBL
0.8000 mg | SUBLINGUAL_TABLET | Freq: Once | SUBLINGUAL | Status: AC
  Administered 2024-11-28: 0.8 mg via SUBLINGUAL

## 2024-12-03 ENCOUNTER — Ambulatory Visit: Payer: Self-pay | Admitting: Cardiology

## 2024-12-24 ENCOUNTER — Ambulatory Visit (HOSPITAL_COMMUNITY)

## 2025-01-17 ENCOUNTER — Ambulatory Visit (HOSPITAL_COMMUNITY): Admitting: Psychiatry

## 2025-01-25 ENCOUNTER — Ambulatory Visit (HOSPITAL_COMMUNITY)

## 2025-02-07 ENCOUNTER — Ambulatory Visit: Admitting: Cardiology

## 2025-07-19 ENCOUNTER — Ambulatory Visit
